# Patient Record
Sex: Male | Born: 1965
Health system: Southern US, Community
[De-identification: ages and names within clinical notes are randomized; demographics above are authoritative.]

## PROBLEM LIST (undated history)

## (undated) DIAGNOSIS — I4729 Other ventricular tachycardia: Secondary | ICD-10-CM

## (undated) DIAGNOSIS — M109 Gout, unspecified: Secondary | ICD-10-CM

## (undated) DIAGNOSIS — M1990 Unspecified osteoarthritis, unspecified site: Secondary | ICD-10-CM

## (undated) DIAGNOSIS — N289 Disorder of kidney and ureter, unspecified: Secondary | ICD-10-CM

## (undated) DIAGNOSIS — I472 Ventricular tachycardia, unspecified: Secondary | ICD-10-CM

## (undated) DIAGNOSIS — I10 Essential (primary) hypertension: Secondary | ICD-10-CM

## (undated) DIAGNOSIS — R609 Edema, unspecified: Secondary | ICD-10-CM

## (undated) DIAGNOSIS — Z5189 Encounter for other specified aftercare: Secondary | ICD-10-CM

## (undated) DIAGNOSIS — I509 Heart failure, unspecified: Secondary | ICD-10-CM

## (undated) DIAGNOSIS — IMO0001 Reserved for inherently not codable concepts without codable children: Secondary | ICD-10-CM

## (undated) DIAGNOSIS — I5023 Acute on chronic systolic (congestive) heart failure: Secondary | ICD-10-CM

## (undated) DIAGNOSIS — N529 Male erectile dysfunction, unspecified: Secondary | ICD-10-CM

## (undated) DIAGNOSIS — I5022 Chronic systolic (congestive) heart failure: Secondary | ICD-10-CM

## (undated) DIAGNOSIS — G5600 Carpal tunnel syndrome, unspecified upper limb: Secondary | ICD-10-CM

## (undated) DIAGNOSIS — E039 Hypothyroidism, unspecified: Secondary | ICD-10-CM

## (undated) DIAGNOSIS — R197 Diarrhea, unspecified: Secondary | ICD-10-CM

## (undated) DIAGNOSIS — R079 Chest pain, unspecified: Secondary | ICD-10-CM

## (undated) HISTORY — DX: Diarrhea, unspecified: R19.7

## (undated) HISTORY — DX: Edema, unspecified: R60.9

## (undated) HISTORY — DX: Gout, unspecified: M10.9

## (undated) HISTORY — DX: Acute on chronic systolic (congestive) heart failure: I50.23

## (undated) HISTORY — DX: Carpal tunnel syndrome, unspecified upper limb: G56.00

## (undated) HISTORY — DX: Male erectile dysfunction, unspecified: N52.9

## (undated) HISTORY — DX: Chronic systolic (congestive) heart failure: I50.22

## (undated) HISTORY — DX: Other ventricular tachycardia: I47.29

## (undated) HISTORY — DX: Ventricular tachycardia: I47.2

## (undated) HISTORY — DX: Heart failure, unspecified: I50.9

## (undated) HISTORY — PX: EYE SURGERY: SHX253

## (undated) HISTORY — DX: Ventricular tachycardia, unspecified: I47.20

## (undated) HISTORY — PX: FRACTURE SURGERY: SHX138

## (undated) HISTORY — DX: Chest pain, unspecified: R07.9

---

## 1999-05-07 ENCOUNTER — Inpatient Hospital Stay (HOSPITAL_COMMUNITY): Admission: EM | Admit: 1999-05-07 | Discharge: 1999-05-16 | Payer: Self-pay | Admitting: Emergency Medicine

## 1999-05-08 ENCOUNTER — Encounter: Payer: Self-pay | Admitting: Pulmonary Disease

## 1999-05-09 ENCOUNTER — Encounter: Payer: Self-pay | Admitting: Pulmonary Disease

## 1999-05-10 ENCOUNTER — Encounter: Payer: Self-pay | Admitting: Pulmonary Disease

## 1999-05-12 ENCOUNTER — Encounter: Payer: Self-pay | Admitting: Pulmonary Disease

## 1999-05-13 ENCOUNTER — Encounter: Payer: Self-pay | Admitting: Pulmonary Disease

## 1999-05-15 ENCOUNTER — Encounter: Payer: Self-pay | Admitting: Pulmonary Disease

## 1999-05-23 ENCOUNTER — Ambulatory Visit (HOSPITAL_COMMUNITY): Admission: RE | Admit: 1999-05-23 | Discharge: 1999-05-23 | Payer: Self-pay | Admitting: Pulmonary Disease

## 1999-05-23 ENCOUNTER — Encounter: Payer: Self-pay | Admitting: Pulmonary Disease

## 2004-06-18 ENCOUNTER — Ambulatory Visit (HOSPITAL_COMMUNITY): Admission: RE | Admit: 2004-06-18 | Discharge: 2004-06-18 | Payer: Self-pay | Admitting: Internal Medicine

## 2004-07-26 ENCOUNTER — Ambulatory Visit: Payer: Self-pay | Admitting: Internal Medicine

## 2004-07-27 ENCOUNTER — Ambulatory Visit: Payer: Self-pay | Admitting: Sports Medicine

## 2004-07-27 ENCOUNTER — Inpatient Hospital Stay (HOSPITAL_COMMUNITY): Admission: EM | Admit: 2004-07-27 | Discharge: 2004-07-30 | Payer: Self-pay | Admitting: Emergency Medicine

## 2004-07-30 ENCOUNTER — Encounter (INDEPENDENT_AMBULATORY_CARE_PROVIDER_SITE_OTHER): Payer: Self-pay | Admitting: Cardiology

## 2004-07-30 ENCOUNTER — Ambulatory Visit: Payer: Self-pay | Admitting: *Deleted

## 2004-08-15 ENCOUNTER — Ambulatory Visit: Payer: Self-pay | Admitting: Internal Medicine

## 2004-08-16 ENCOUNTER — Ambulatory Visit: Payer: Self-pay | Admitting: Internal Medicine

## 2004-08-23 ENCOUNTER — Ambulatory Visit: Payer: Self-pay | Admitting: Internal Medicine

## 2004-09-13 ENCOUNTER — Ambulatory Visit: Payer: Self-pay | Admitting: Internal Medicine

## 2004-12-06 ENCOUNTER — Ambulatory Visit: Payer: Self-pay | Admitting: Internal Medicine

## 2004-12-09 ENCOUNTER — Ambulatory Visit: Payer: Self-pay | Admitting: *Deleted

## 2004-12-09 ENCOUNTER — Inpatient Hospital Stay (HOSPITAL_COMMUNITY): Admission: EM | Admit: 2004-12-09 | Discharge: 2004-12-16 | Payer: Self-pay | Admitting: Family Medicine

## 2004-12-09 ENCOUNTER — Ambulatory Visit: Payer: Self-pay | Admitting: Internal Medicine

## 2004-12-09 ENCOUNTER — Ambulatory Visit: Payer: Self-pay | Admitting: Pulmonary Disease

## 2004-12-09 ENCOUNTER — Ambulatory Visit: Payer: Self-pay | Admitting: Infectious Diseases

## 2004-12-10 ENCOUNTER — Encounter (INDEPENDENT_AMBULATORY_CARE_PROVIDER_SITE_OTHER): Payer: Self-pay | Admitting: Cardiology

## 2004-12-18 ENCOUNTER — Ambulatory Visit: Payer: Self-pay | Admitting: Internal Medicine

## 2004-12-20 ENCOUNTER — Ambulatory Visit: Payer: Self-pay | Admitting: Internal Medicine

## 2004-12-23 ENCOUNTER — Emergency Department (HOSPITAL_COMMUNITY): Admission: EM | Admit: 2004-12-23 | Discharge: 2004-12-23 | Payer: Self-pay | Admitting: Emergency Medicine

## 2004-12-26 ENCOUNTER — Ambulatory Visit: Payer: Self-pay | Admitting: Internal Medicine

## 2004-12-27 ENCOUNTER — Ambulatory Visit: Payer: Self-pay | Admitting: Internal Medicine

## 2005-01-01 ENCOUNTER — Ambulatory Visit: Payer: Self-pay | Admitting: Internal Medicine

## 2005-01-06 ENCOUNTER — Ambulatory Visit: Payer: Self-pay | Admitting: Internal Medicine

## 2005-01-13 ENCOUNTER — Ambulatory Visit: Payer: Self-pay | Admitting: Internal Medicine

## 2005-01-21 ENCOUNTER — Ambulatory Visit: Payer: Self-pay | Admitting: Internal Medicine

## 2005-02-17 ENCOUNTER — Ambulatory Visit: Payer: Self-pay | Admitting: Internal Medicine

## 2005-03-04 ENCOUNTER — Ambulatory Visit: Payer: Self-pay | Admitting: Internal Medicine

## 2005-05-23 IMAGING — CR DG CHEST 2V
2 series · 2 of 2 positions shown · non-contrast
Comparison: none

CLINICAL DATA: Shortness of breath, weakness.  History of   congestive heart failure.  
 CHEST TWO VIEWS
 Moderate cardiomegaly is seen as well as pulmonary venous hypertension.  There is no evidence of acute infiltrate or edema.  There is no evidence of pleural effusion.  No mass or adenopathy identified.
 IMPRESSION
 Moderate cardiomegaly and pulmonary venous hypertension.  No acute disease.

[view not recorded (1 of 2)]
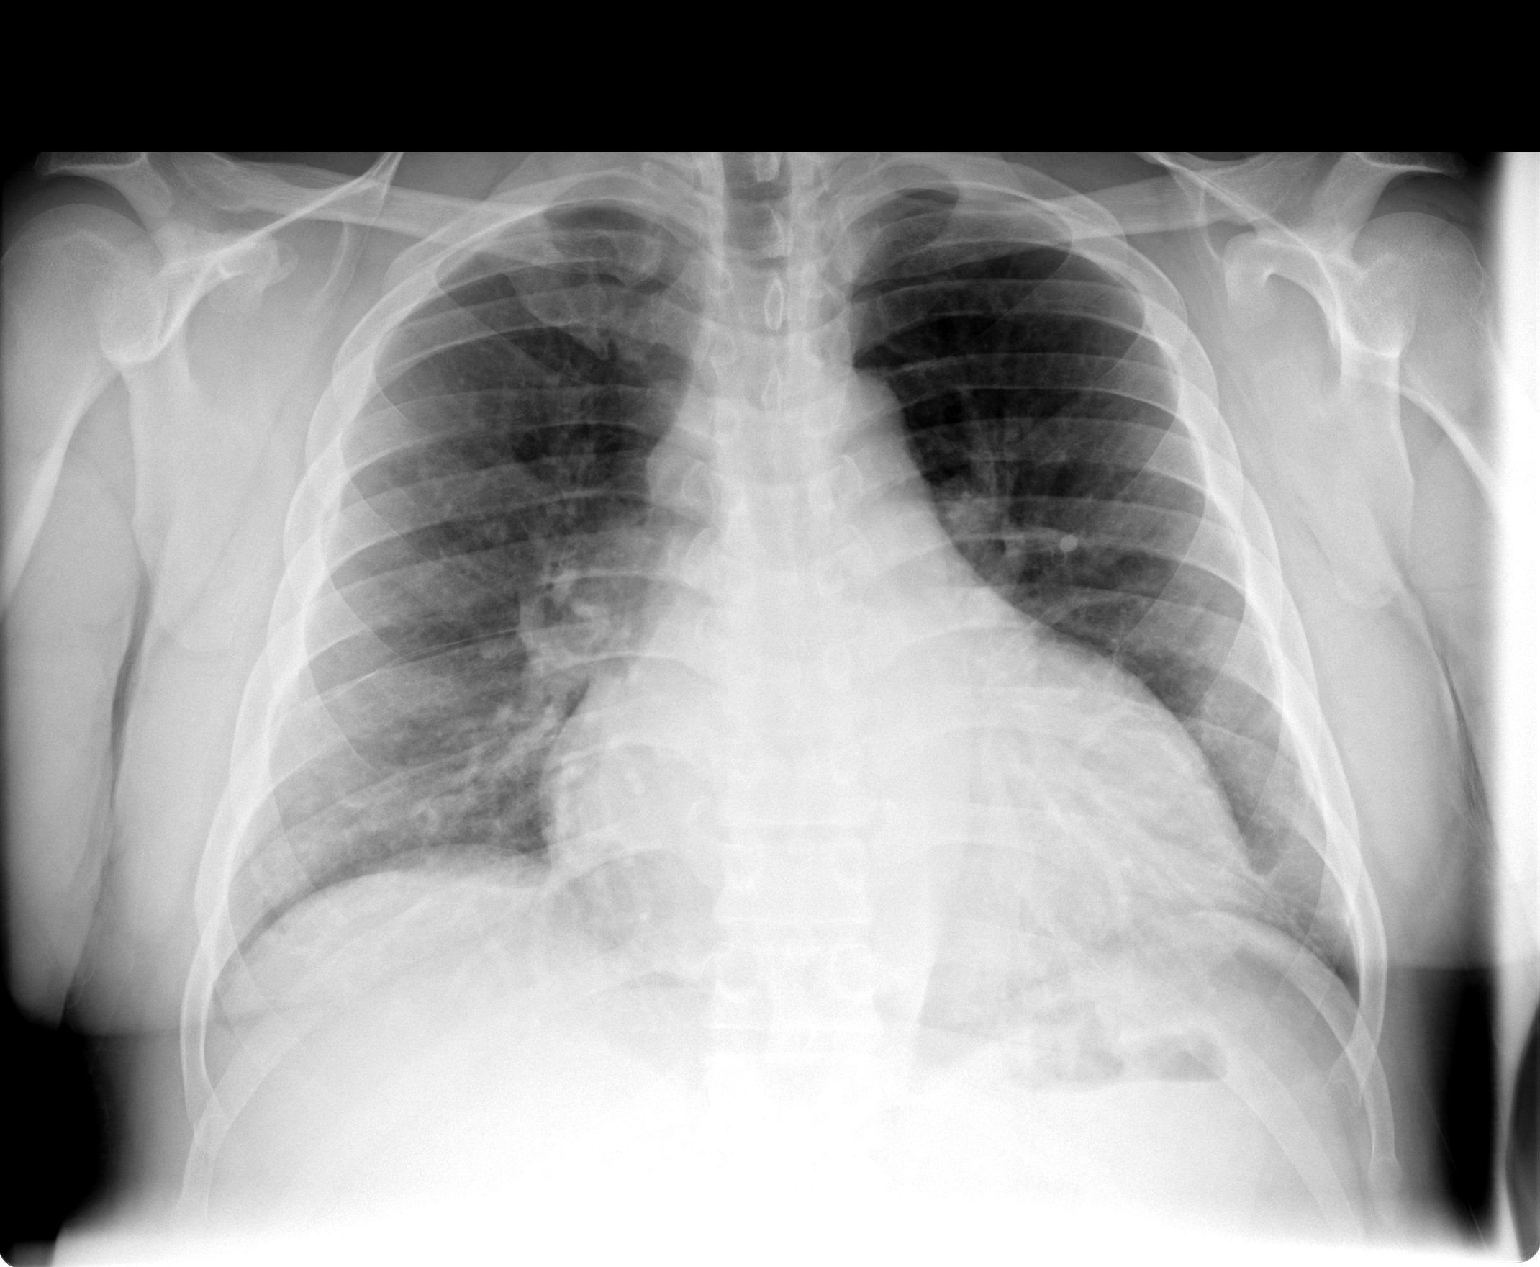

[view not recorded (2 of 2)]
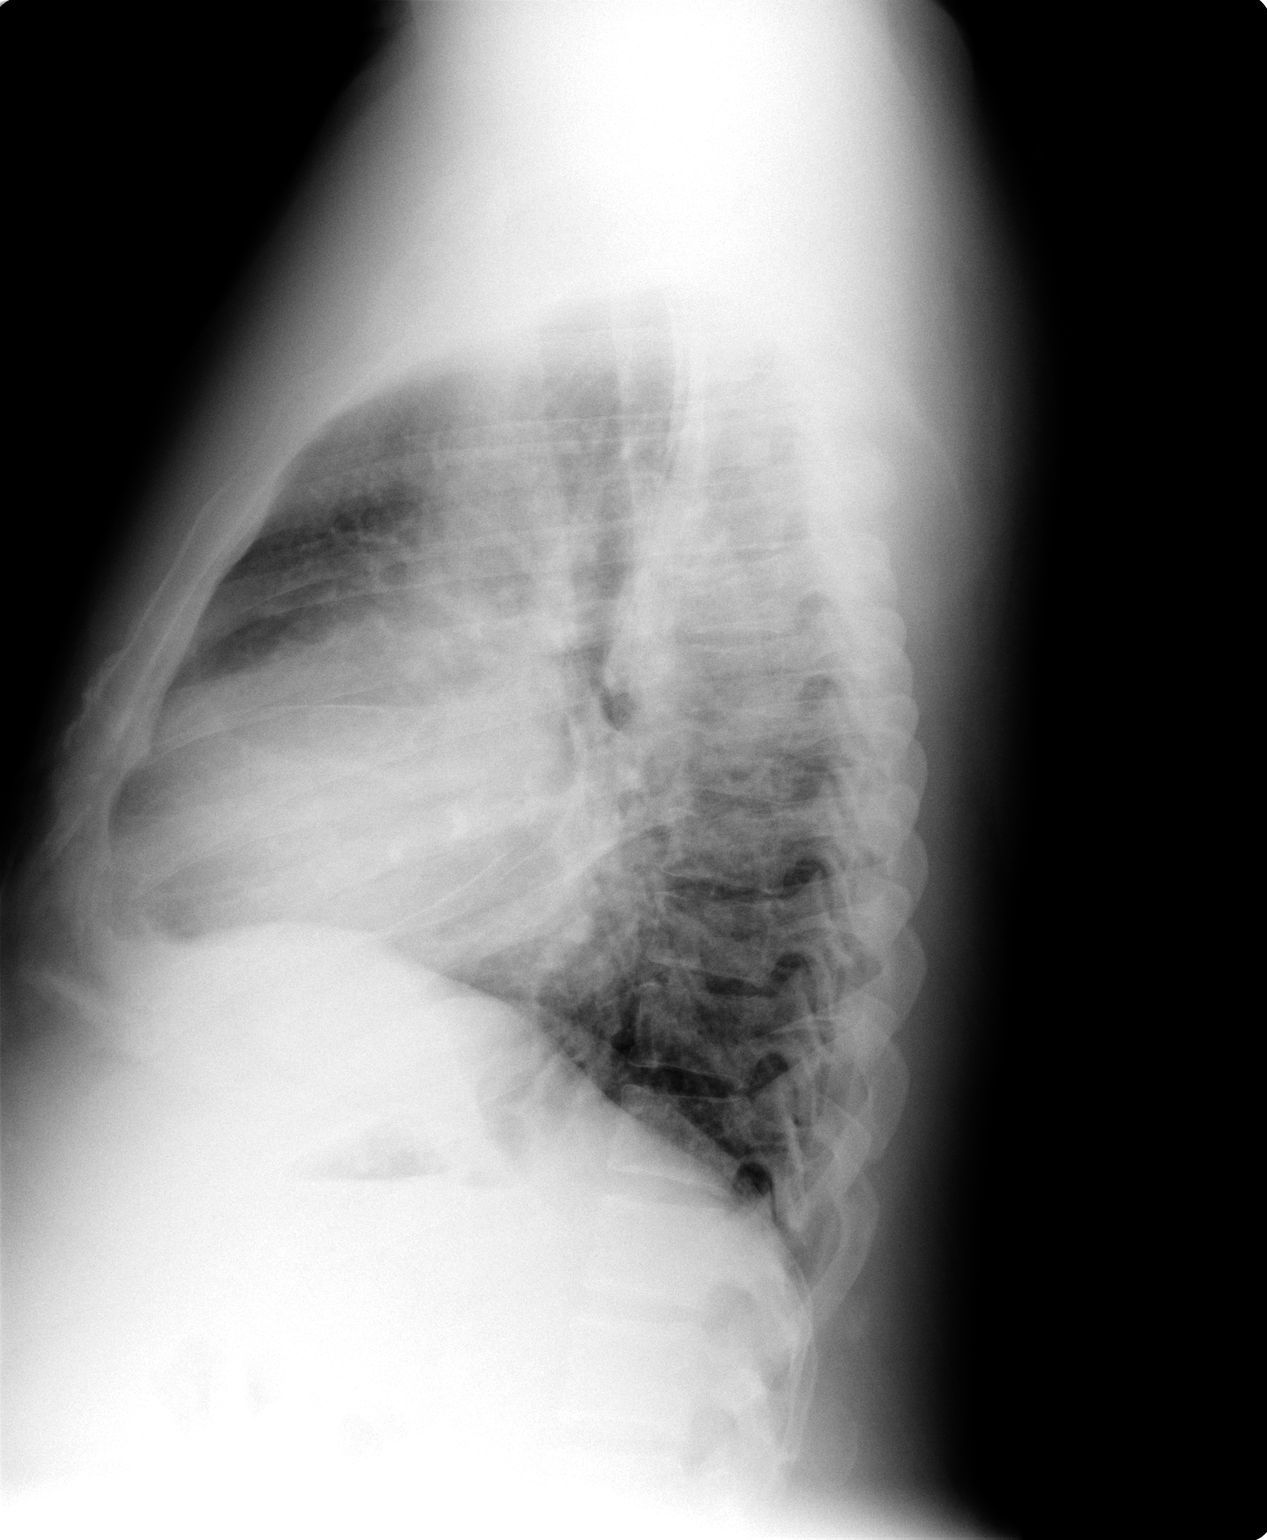

[2 of 2 positions shown; findings below may reference images not displayed]

## 2005-05-30 ENCOUNTER — Ambulatory Visit: Payer: Self-pay | Admitting: Internal Medicine

## 2005-06-05 ENCOUNTER — Ambulatory Visit (HOSPITAL_COMMUNITY): Admission: RE | Admit: 2005-06-05 | Discharge: 2005-06-05 | Payer: Self-pay | Admitting: Internal Medicine

## 2005-07-01 IMAGING — CR DG CHEST 2V
2 series · 2 of 2 positions shown · non-contrast
Comparison: 18 June, 2004.

CLINICAL DATA: Short of breath.   Right arm pain.
 CHEST 2 VIEW

[view not recorded (1 of 2)]
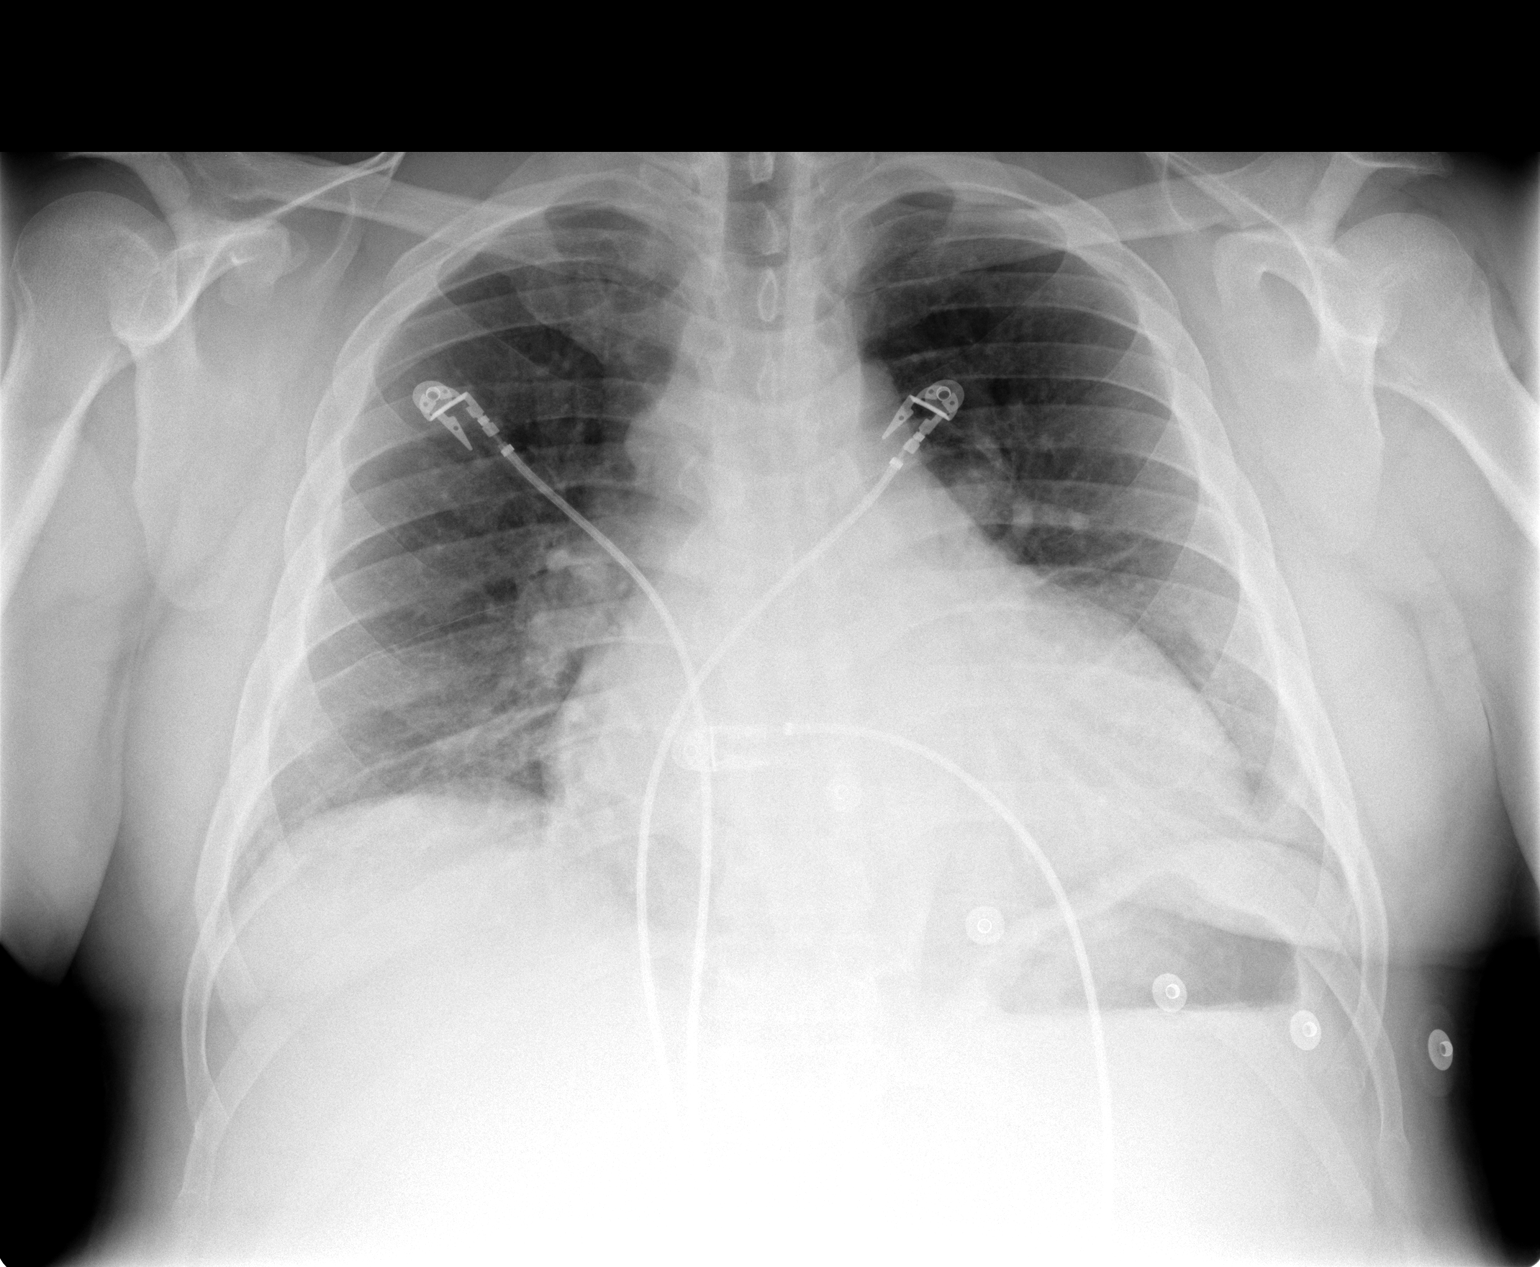

[view not recorded (2 of 2)]
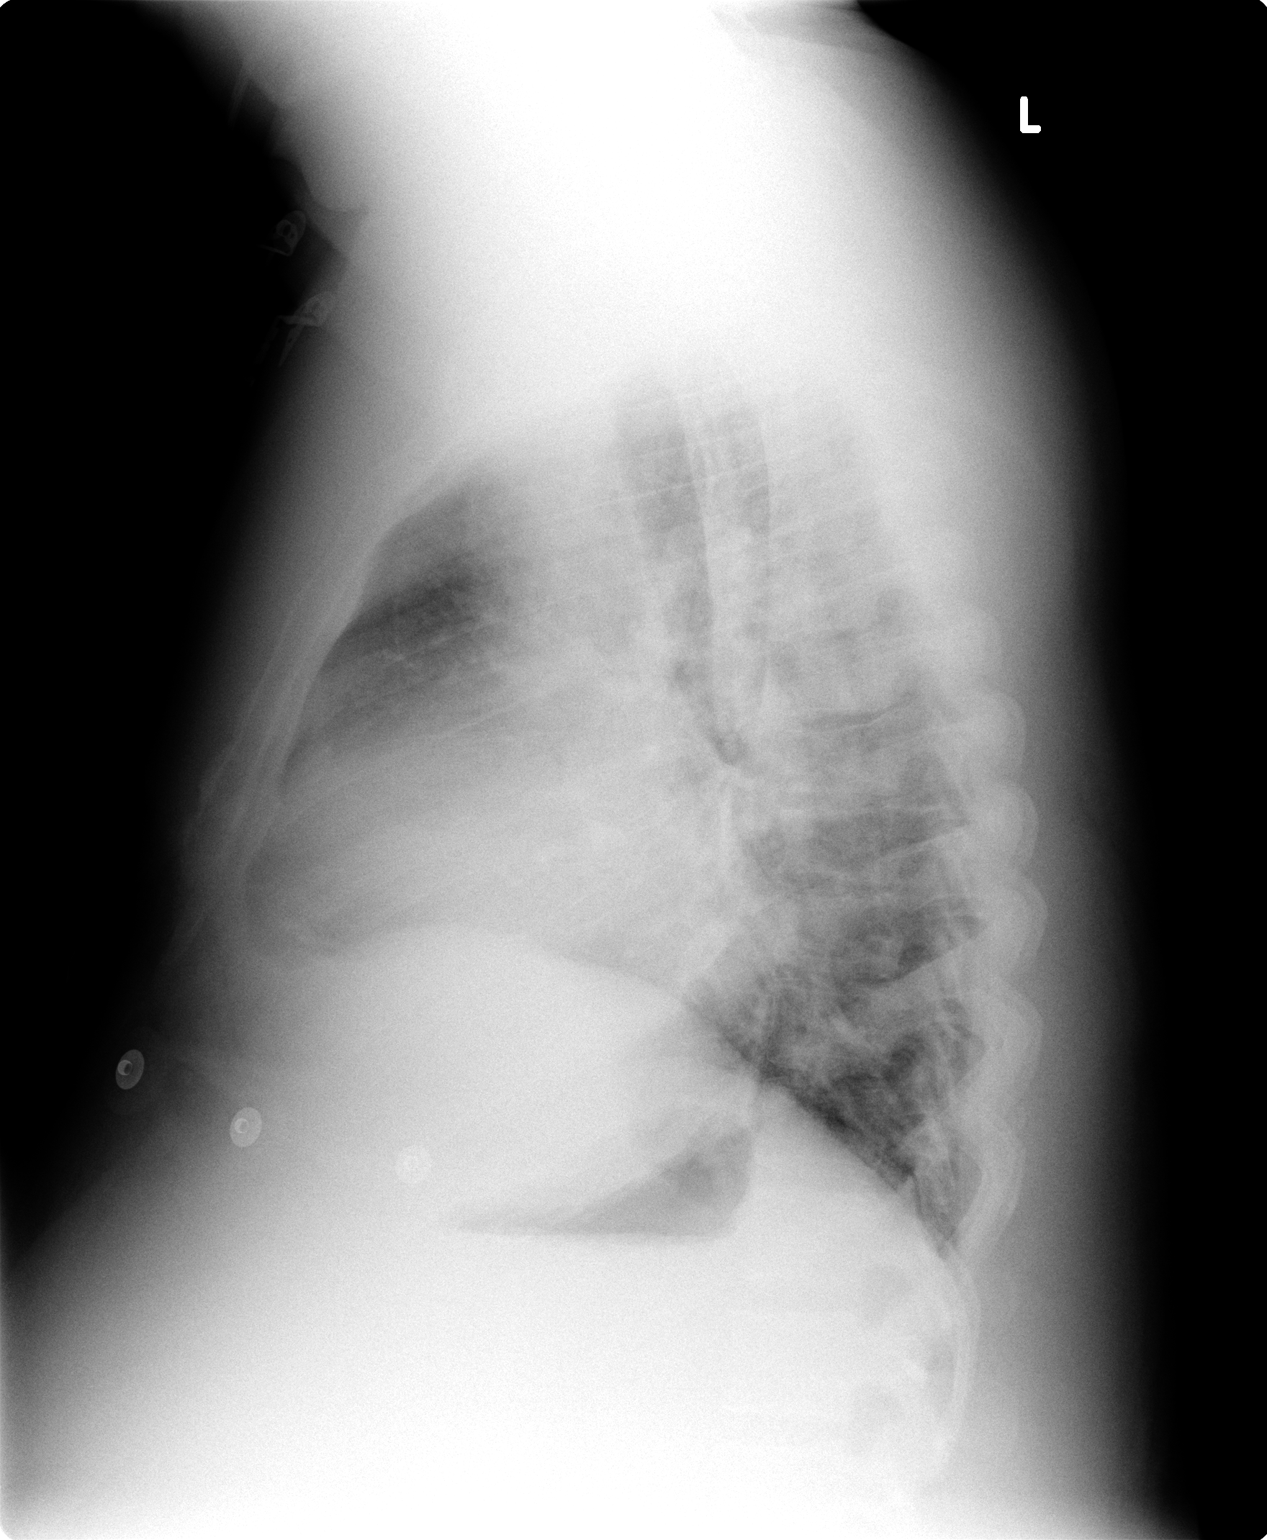

[2 of 2 positions shown; findings below may reference images not displayed]

The heart is enlarged and there is mild vascular congestion.  There is no edema or effusion.
 IMPRESSION
 Cardiac enlargement and vascular congestion unchanged from the prior study.

## 2005-11-13 IMAGING — CR DG CHEST 1V PORT
1 series · 1 of 1 positions shown · non-contrast
Comparison: 12/09/2004 and 07/27/2004.

CLINICAL DATA: Chest pain.  Shortness of breath.  Hypertension. 
 PORTABLE CHEST:

[view not recorded]
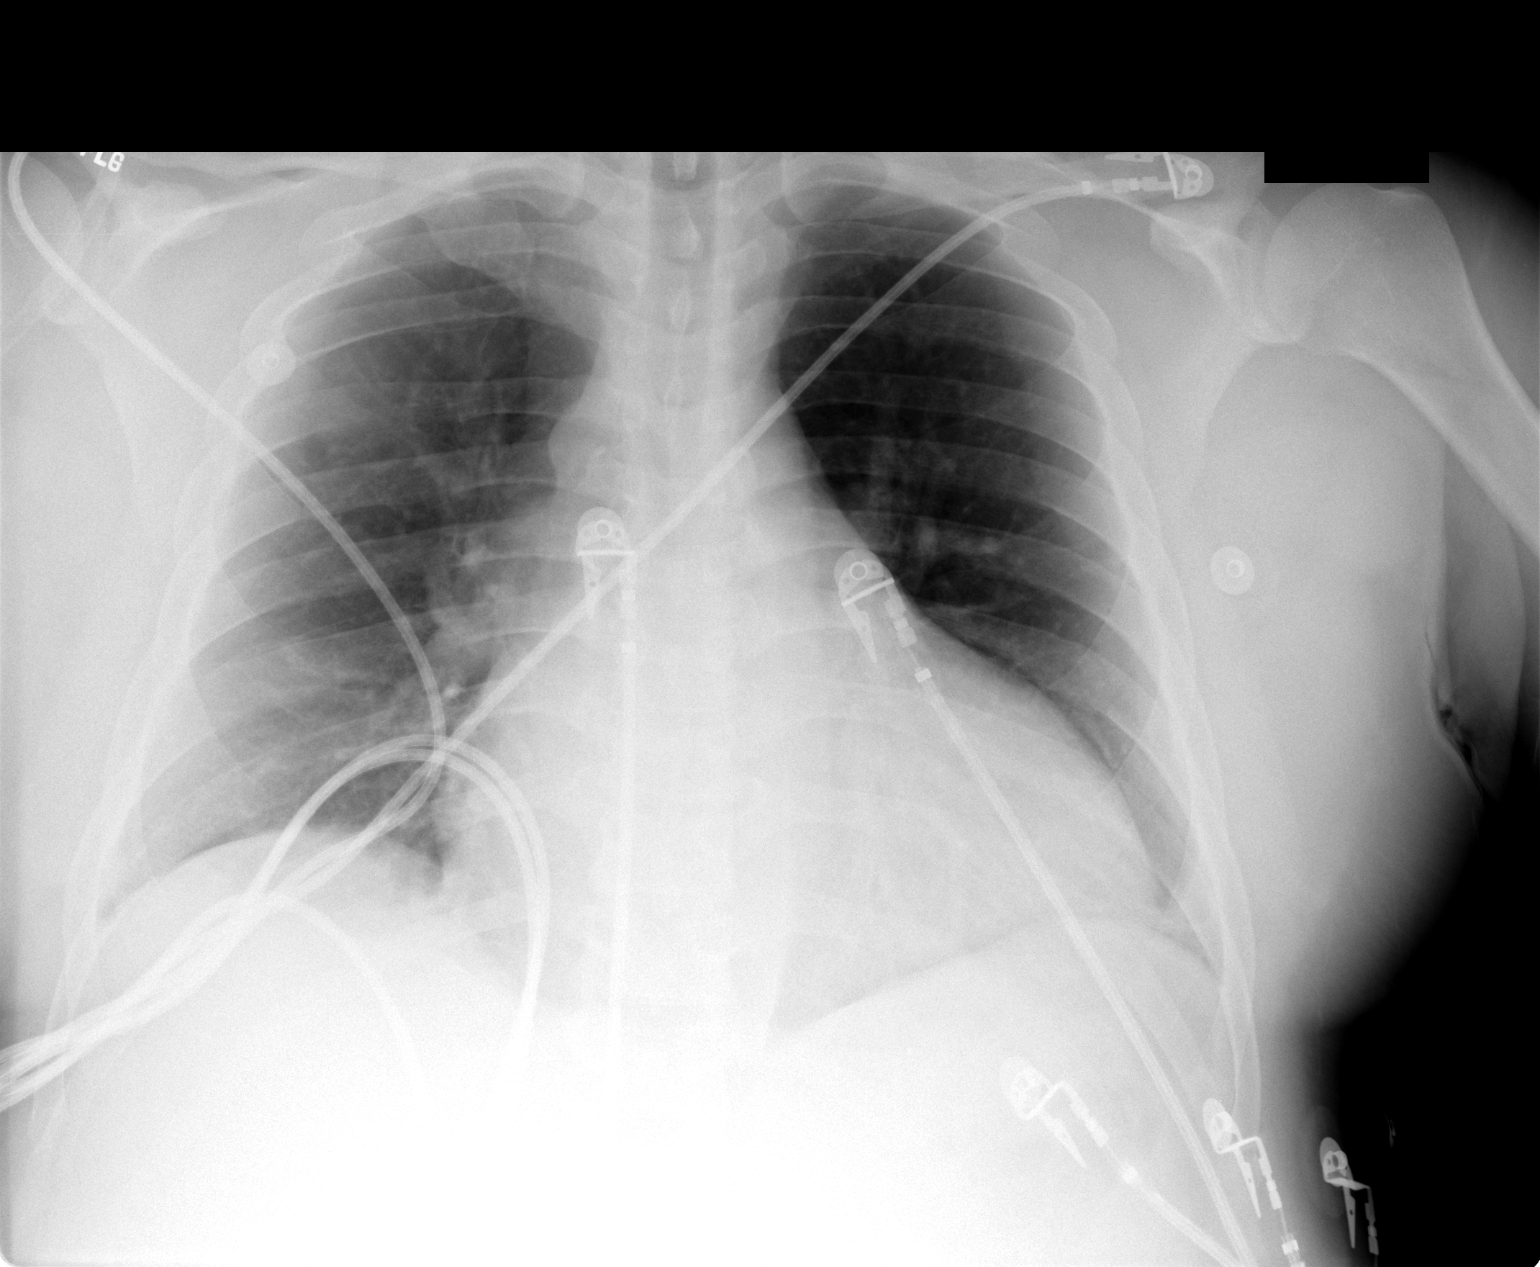

[1 of 1 positions shown; findings below may reference images not displayed]

Moderate cardiomegaly remains stable.  Mild scarring is noted at the left lung base which is unchanged since earlier chest radiographs.  There is no evidence of acute infiltrate or congestive heart failure.   There is no evidence of pleural effusion.
IMPRESSION: Stable moderate cardiomegaly.  No acute findings.

## 2005-11-13 IMAGING — CR DG CHEST 2V
2 series · 2 of 2 positions shown · non-contrast
Comparison: 07/27/2004.

CLINICAL DATA: 38 year-old with chest pain and shortness of breath with exertion.  History of smoking.
 TWO VIEWS OF THE CHEST:

[view not recorded (1 of 2)]
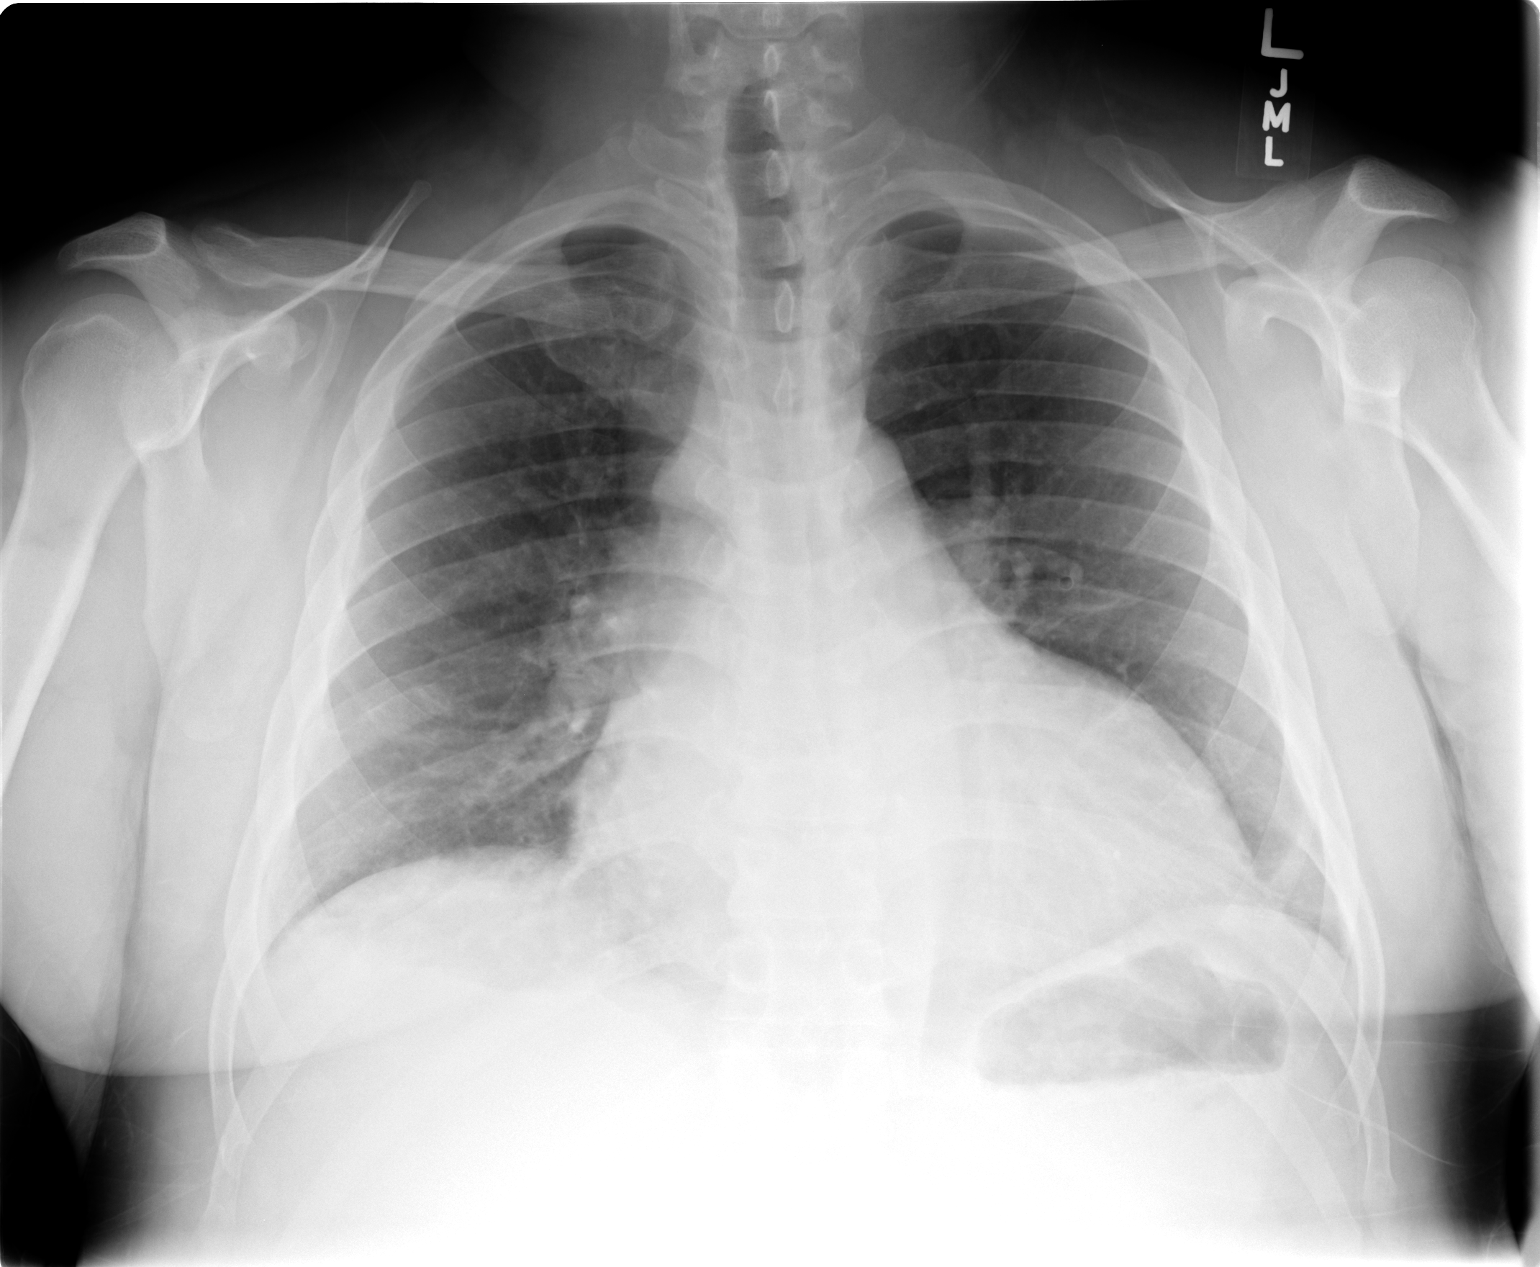

[view not recorded (2 of 2)]
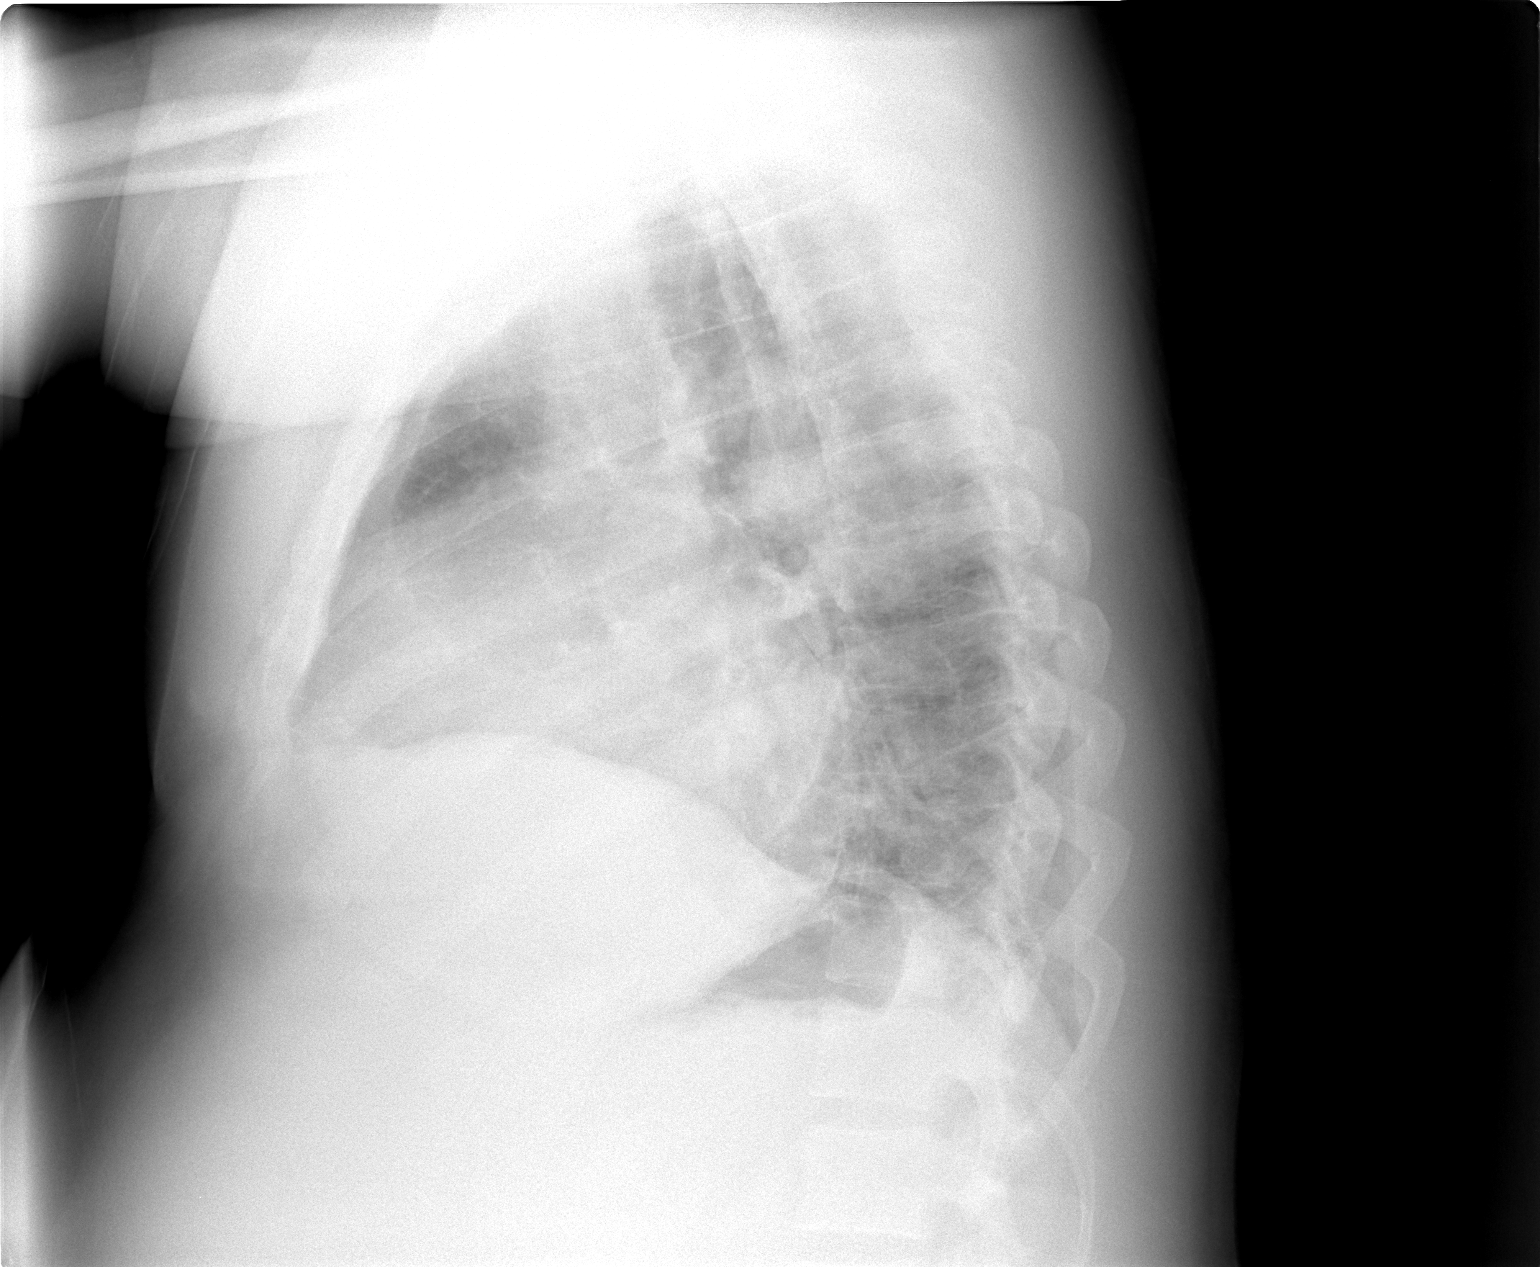

[2 of 2 positions shown; findings below may reference images not displayed]

FINDINGS: Heart size is enlarged.  There is minimal left base atelectasis.  No focal consolidation or pleural effusion.  No evidence for pulmonary edema.
IMPRESSION: Cardiomegaly without evidence for acute pulmonary abnormality.

## 2005-11-14 IMAGING — CR DG CHEST 1V PORT
1 series · 1 of 1 positions shown · non-contrast
Comparison: none

CLINICAL DATA: Chest pain. PICC line placement.
 PORTABLE CHEST SINGLE VIEW ? 12/10/04:
 An AP sitting portable film of the chest made 12/10/04 at 6669 hours is compared to the previous study of 2092 hours on this day and now shows a left PICC line to have been inserted.  The tip of that catheter lies near the junction of the right atrium and superior vena cava.  There is no pneumothorax. 
 There is again noted cardiomegaly and bilateral basilar atelectasis.

[view not recorded]
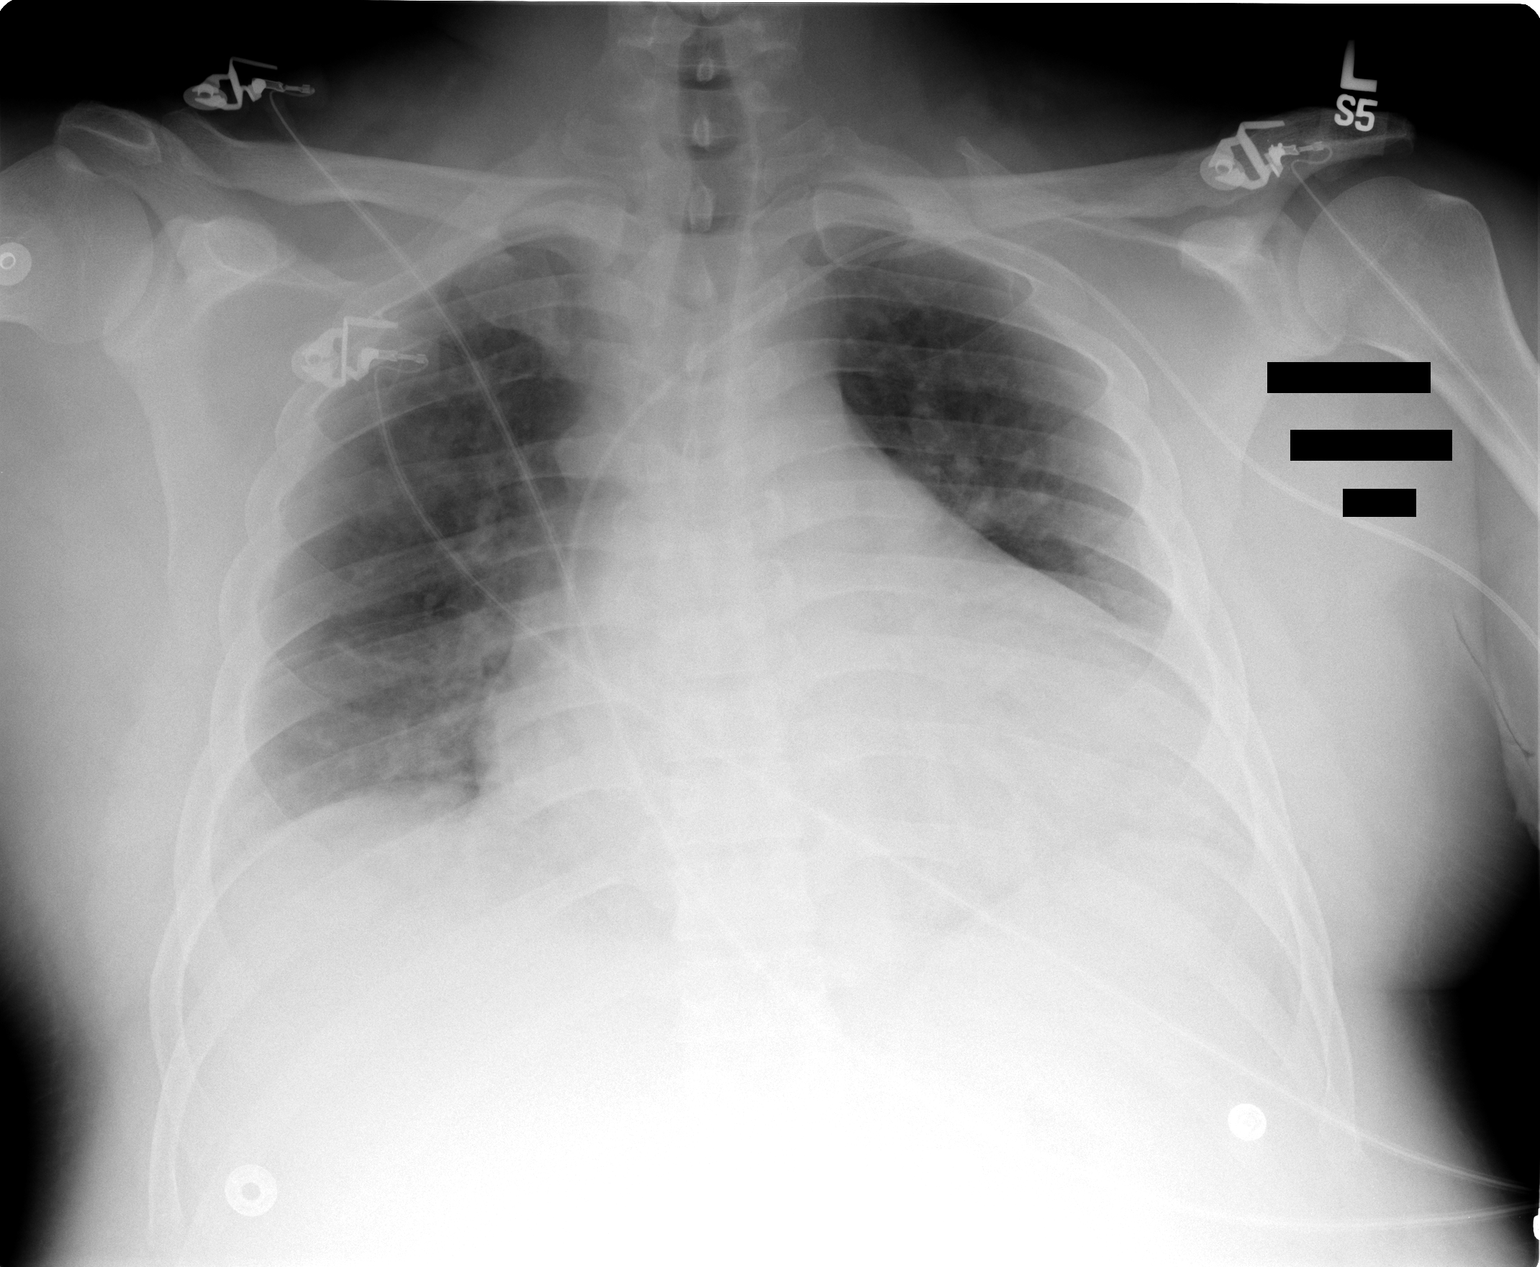

[1 of 1 positions shown; findings below may reference images not displayed]

IMPRESSION: PICC line tip near the junction of the right atrium and superior vena cava.  No pneumothorax.
 Bilateral basilar atelectasis and cardiomegaly are again noted.

## 2005-11-14 IMAGING — CR DG CHEST 2V
2 series · 2 of 2 positions shown · non-contrast
Comparison: 12/09/04.

CLINICAL DATA: Chest pain, shortness of breath.  Cough.
 CHEST - 2 VIEW:

[view not recorded (1 of 2)]
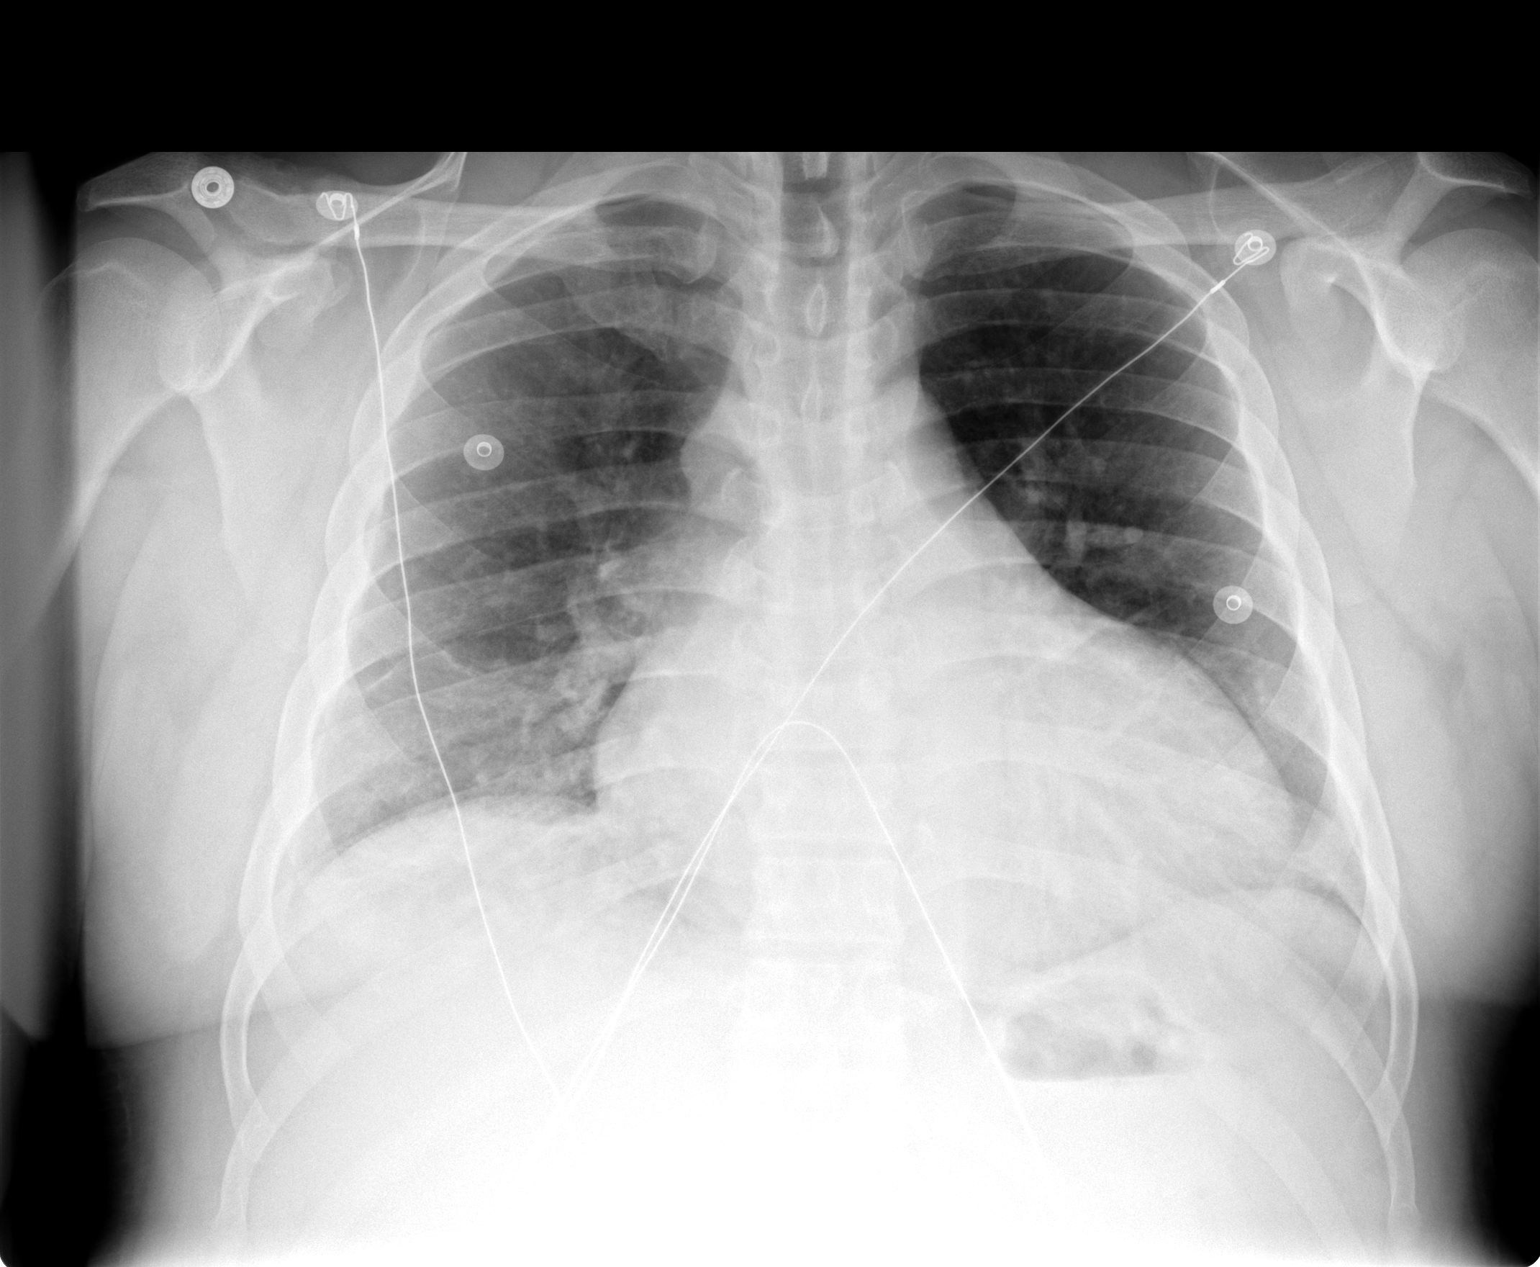

[view not recorded (2 of 2)]
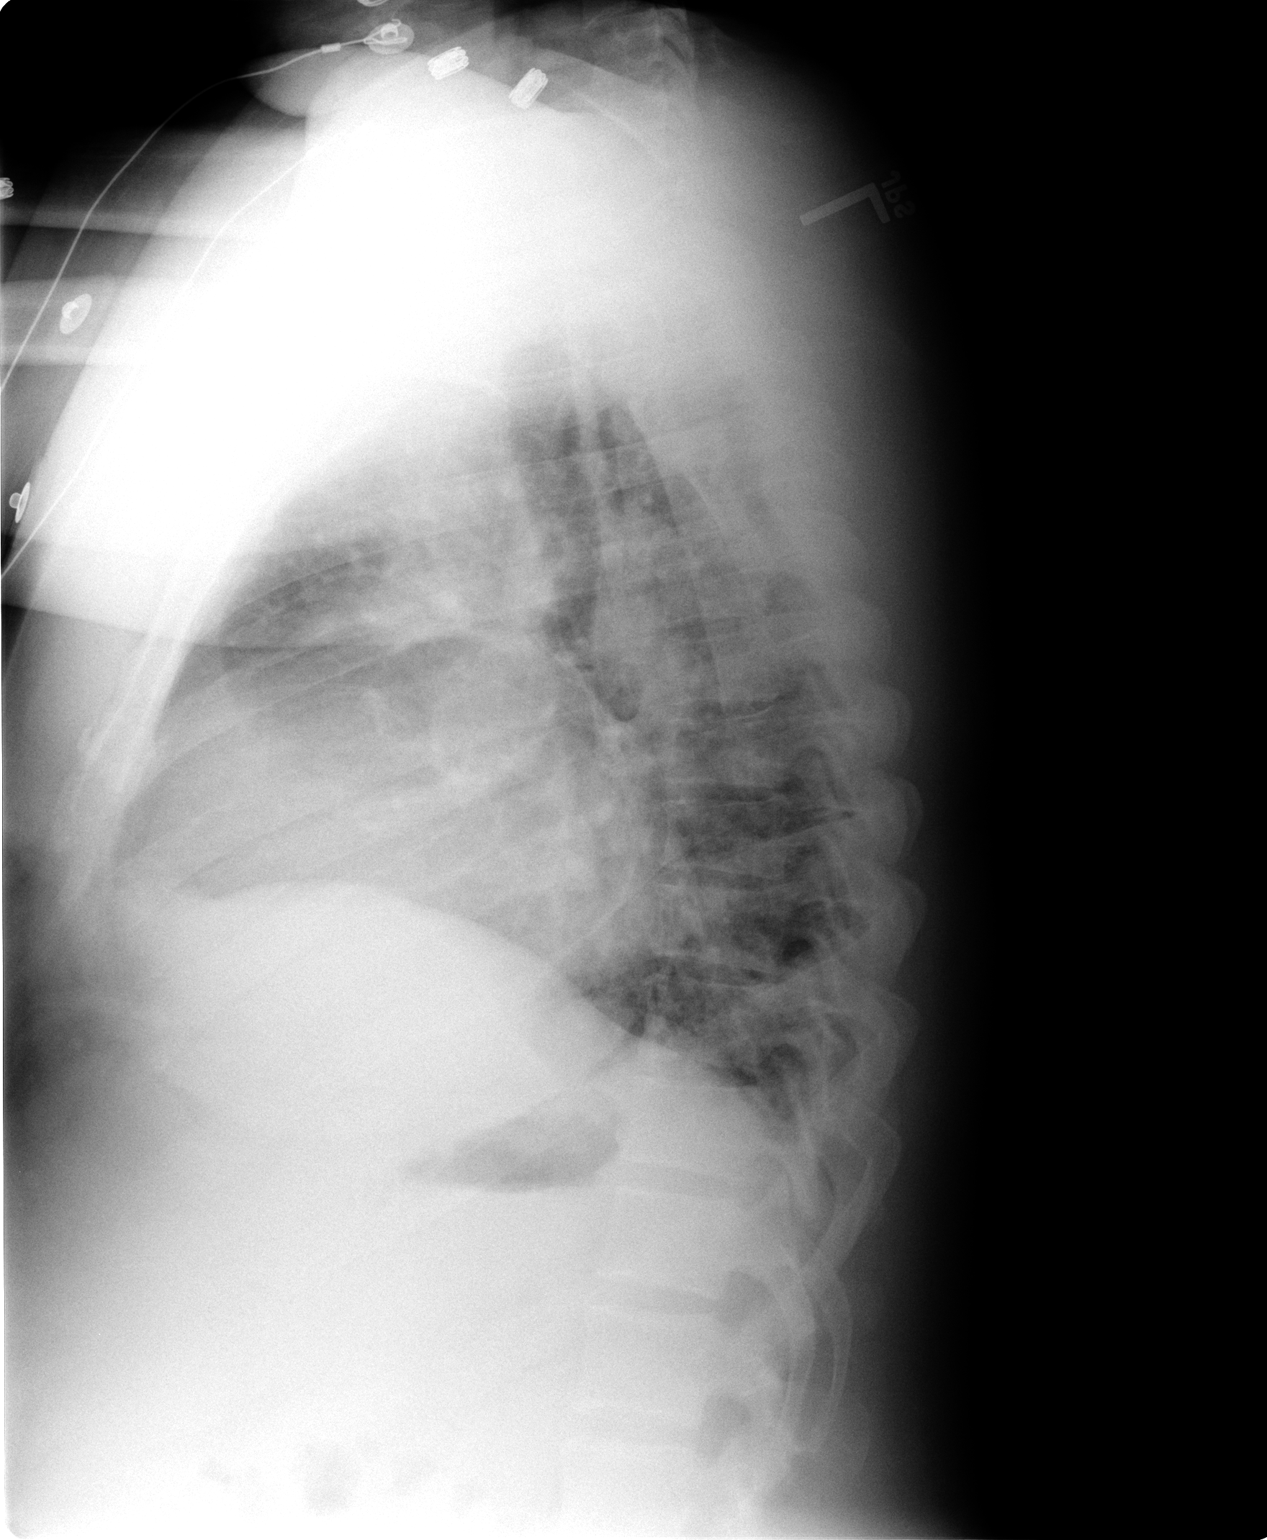

[2 of 2 positions shown; findings below may reference images not displayed]

There is stable enlargement of the cardiopericardial silhouette.  Subsegmental atelectasis is present in the lingula and along the minor fissure.  There is some crowding of the pulmonary vasculature with no overt edema.
IMPRESSION: 1.  Cardiomegaly.
 2.  Subsegmental atelectasis along the lingula and minor fissure.

## 2005-11-14 IMAGING — US US RETROPERITONEAL COMPLETE
1 series · 14 of 25 positions shown · non-contrast
Comparison: None.

CLINICAL DATA: Elevated serum creatinine.
 ULTRASOUND RENAL:

[Series 1: unknown · 0.40mm/px · 14 of 26 slices shown]
[im 1/26]
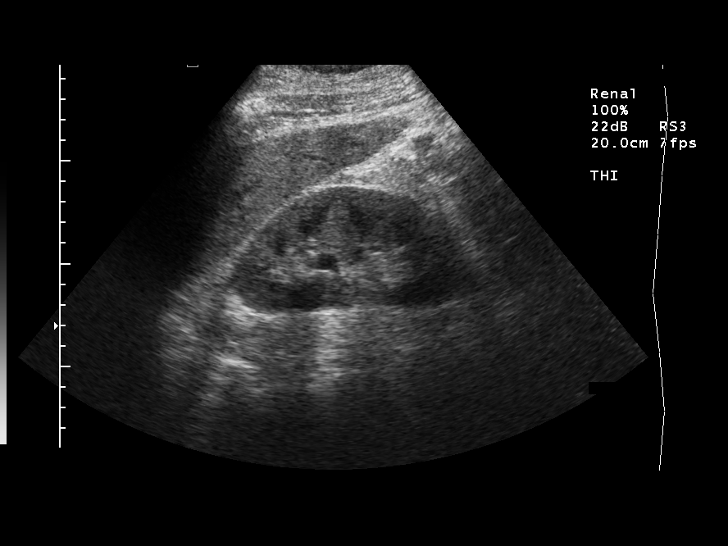
[im 3/26]
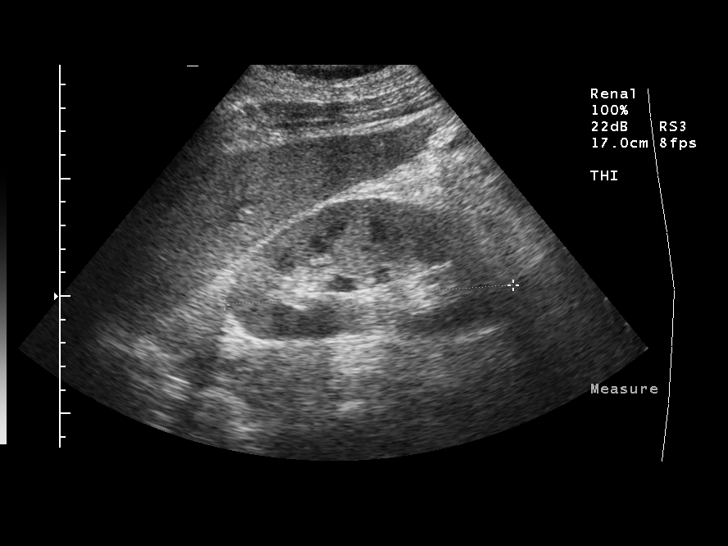
[im 5/26]
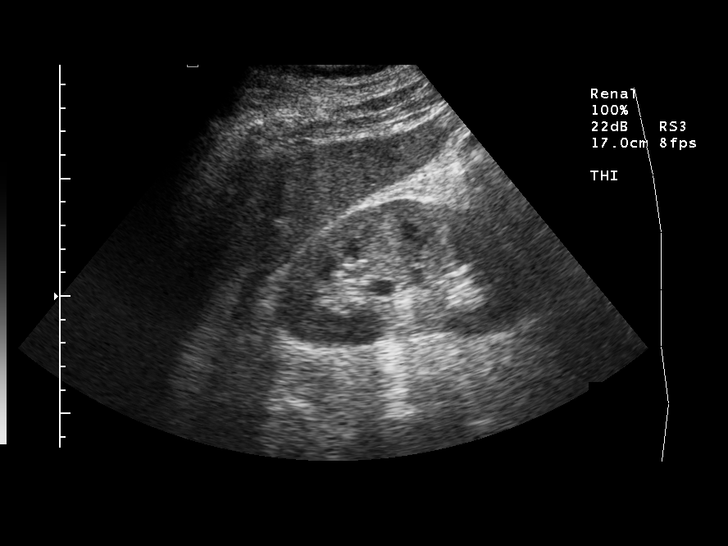
[im 7/26]
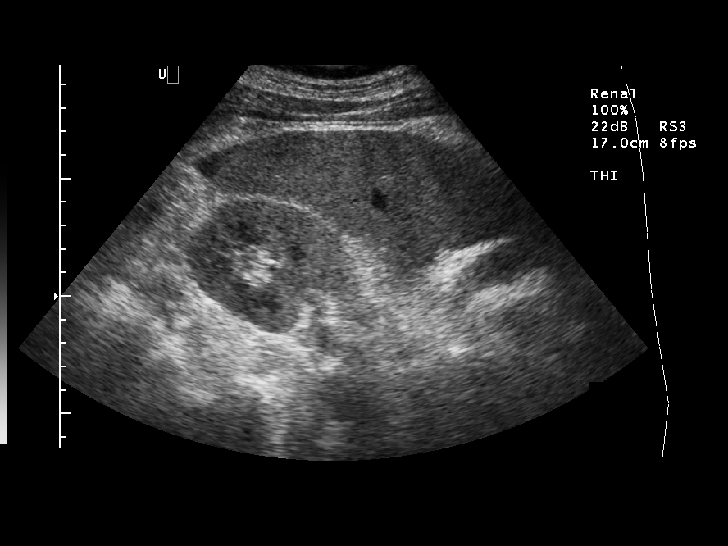
[im 9/26]
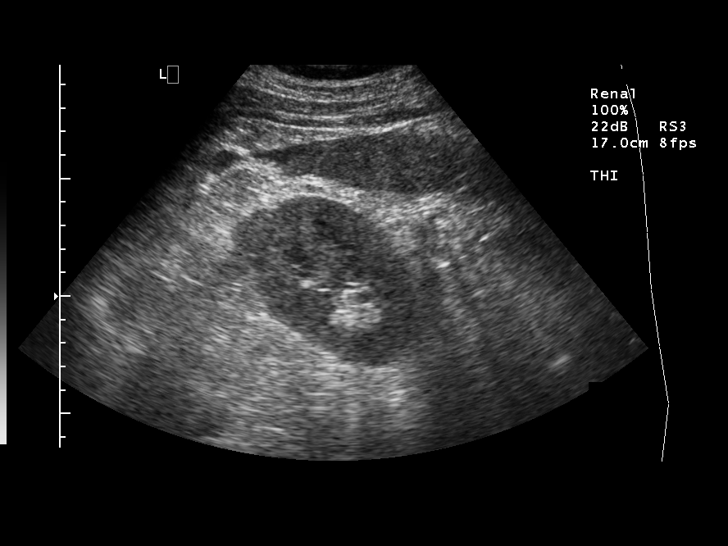
[im 10/26]
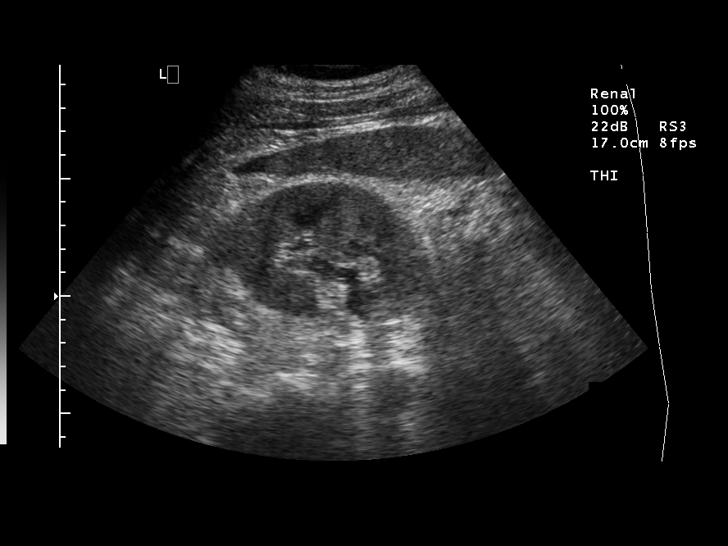
[im 12/26]
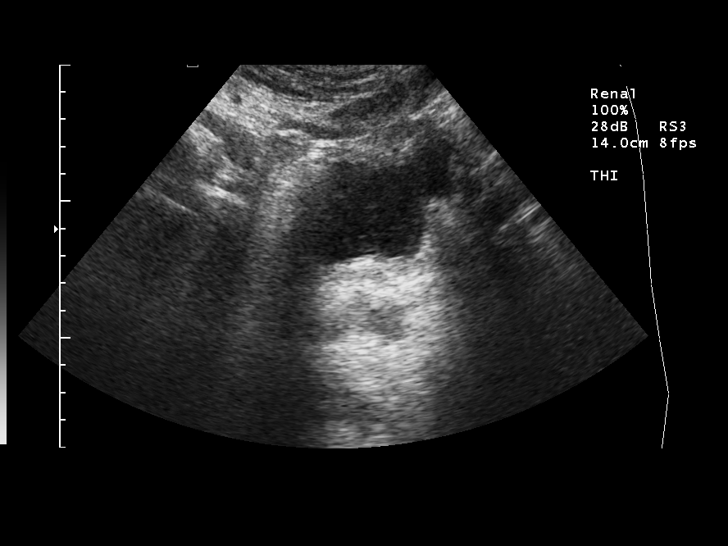
[im 14/26]
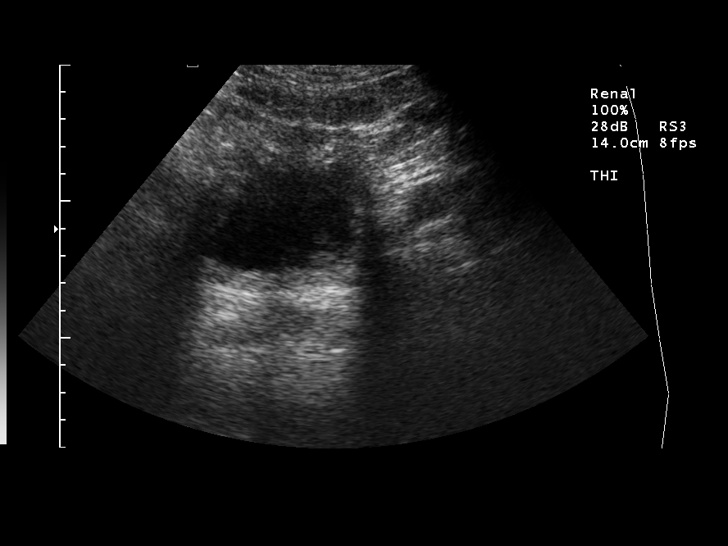
[im 16/26]
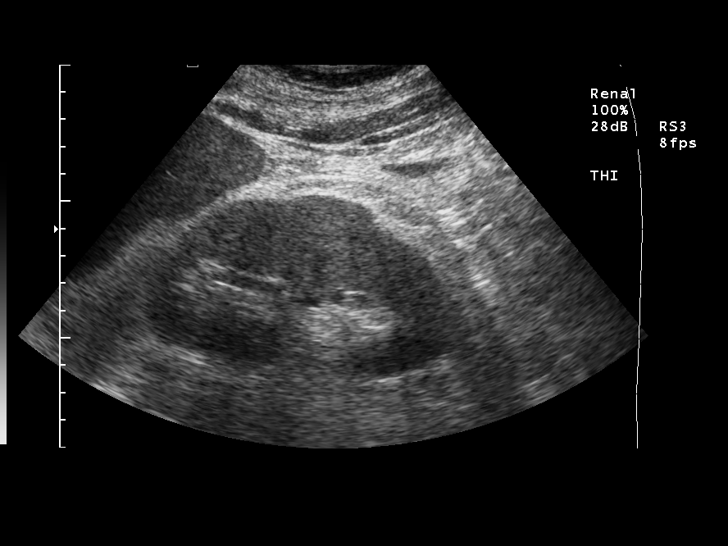
[im 17/26]
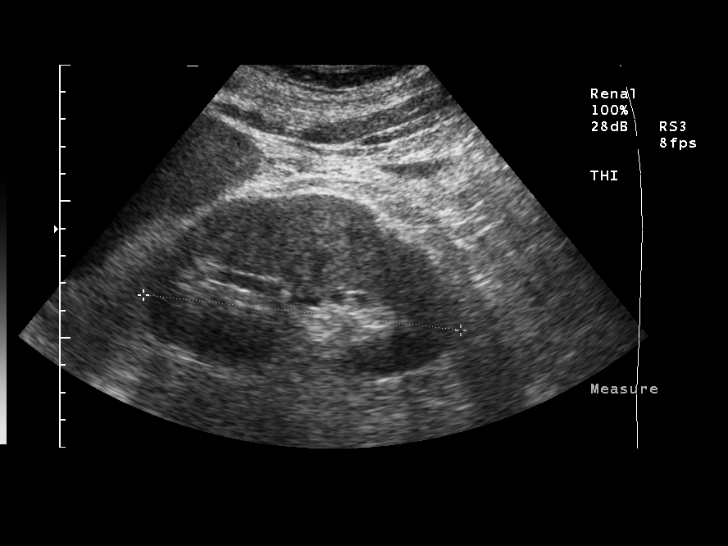
[im 19/26]
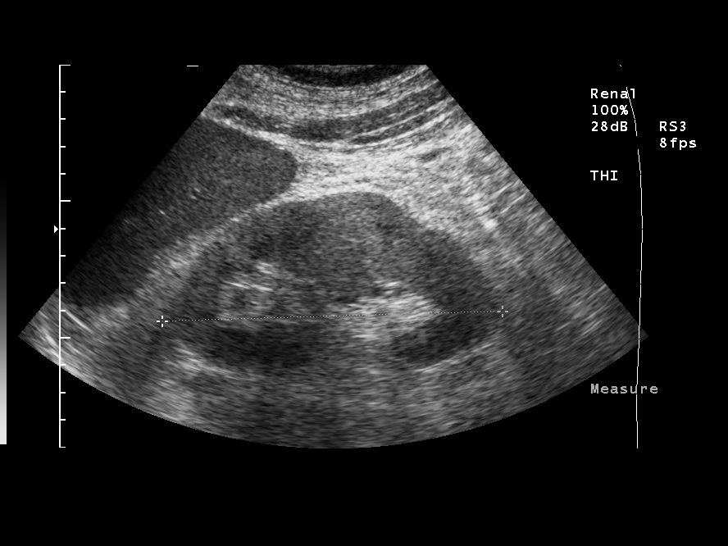
[im 21/26]
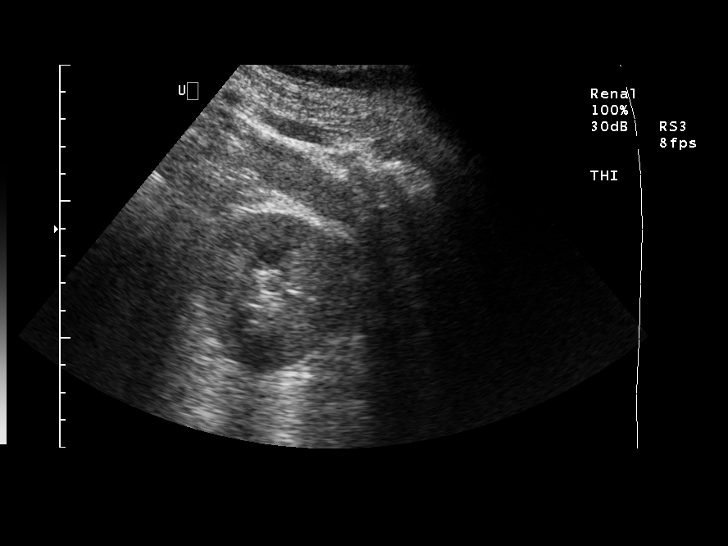
[im 23/26]
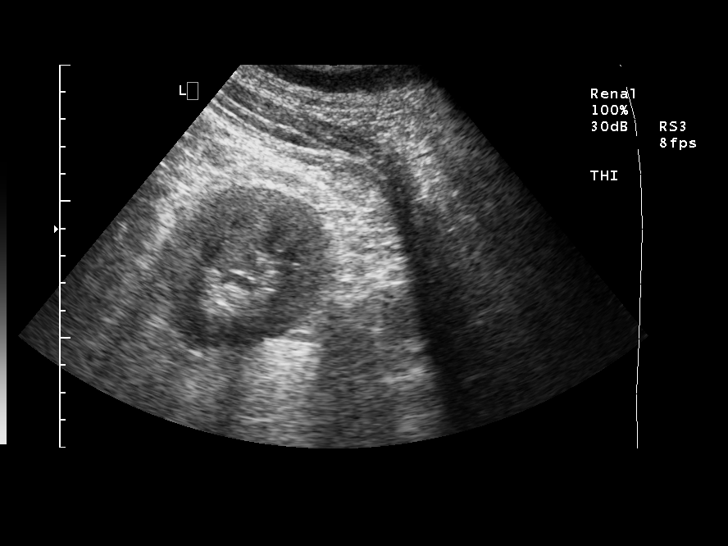
[im 26/26]
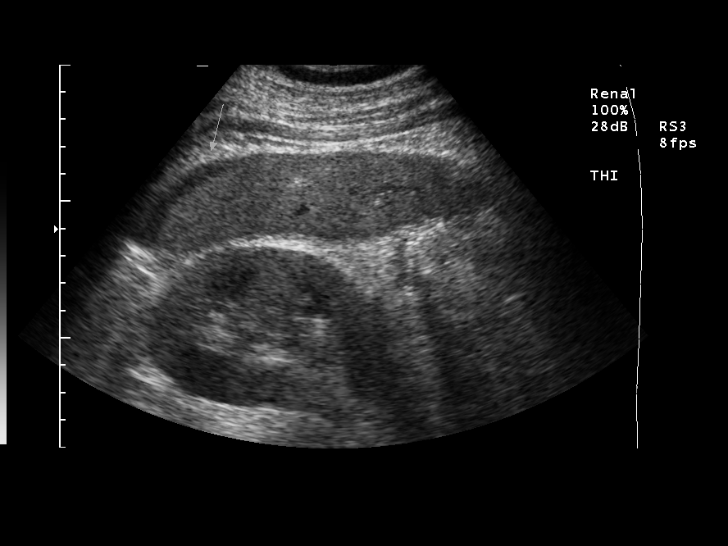

[14 of 25 positions shown; findings below may reference images not displayed]

The right kidney is 12.3 cm in long axis.  The left kidney measures 12.4 cm.  Renal parenchymal echo texture is increased bilaterally in a diffuse fashion.  There is no evidence for hydronephrosis.
 Midline imaging through the anatomic pelvis shows a non distended urinary bladder.
 Trace amount of ascites is identified adjacent to the liver and spleen.
IMPRESSION: 1.  Mildly increased renal parenchymal echo texture suggests medical renal disease.
 2.  Trace ascites.

## 2005-11-14 IMAGING — CR DG ABDOMEN 2V
2 series · 2 of 2 positions shown · non-contrast
Comparison: None.

CLINICAL DATA: Chest pain.  
 ABDOMEN ? TWO VIEW:

[view not recorded (1 of 2)]
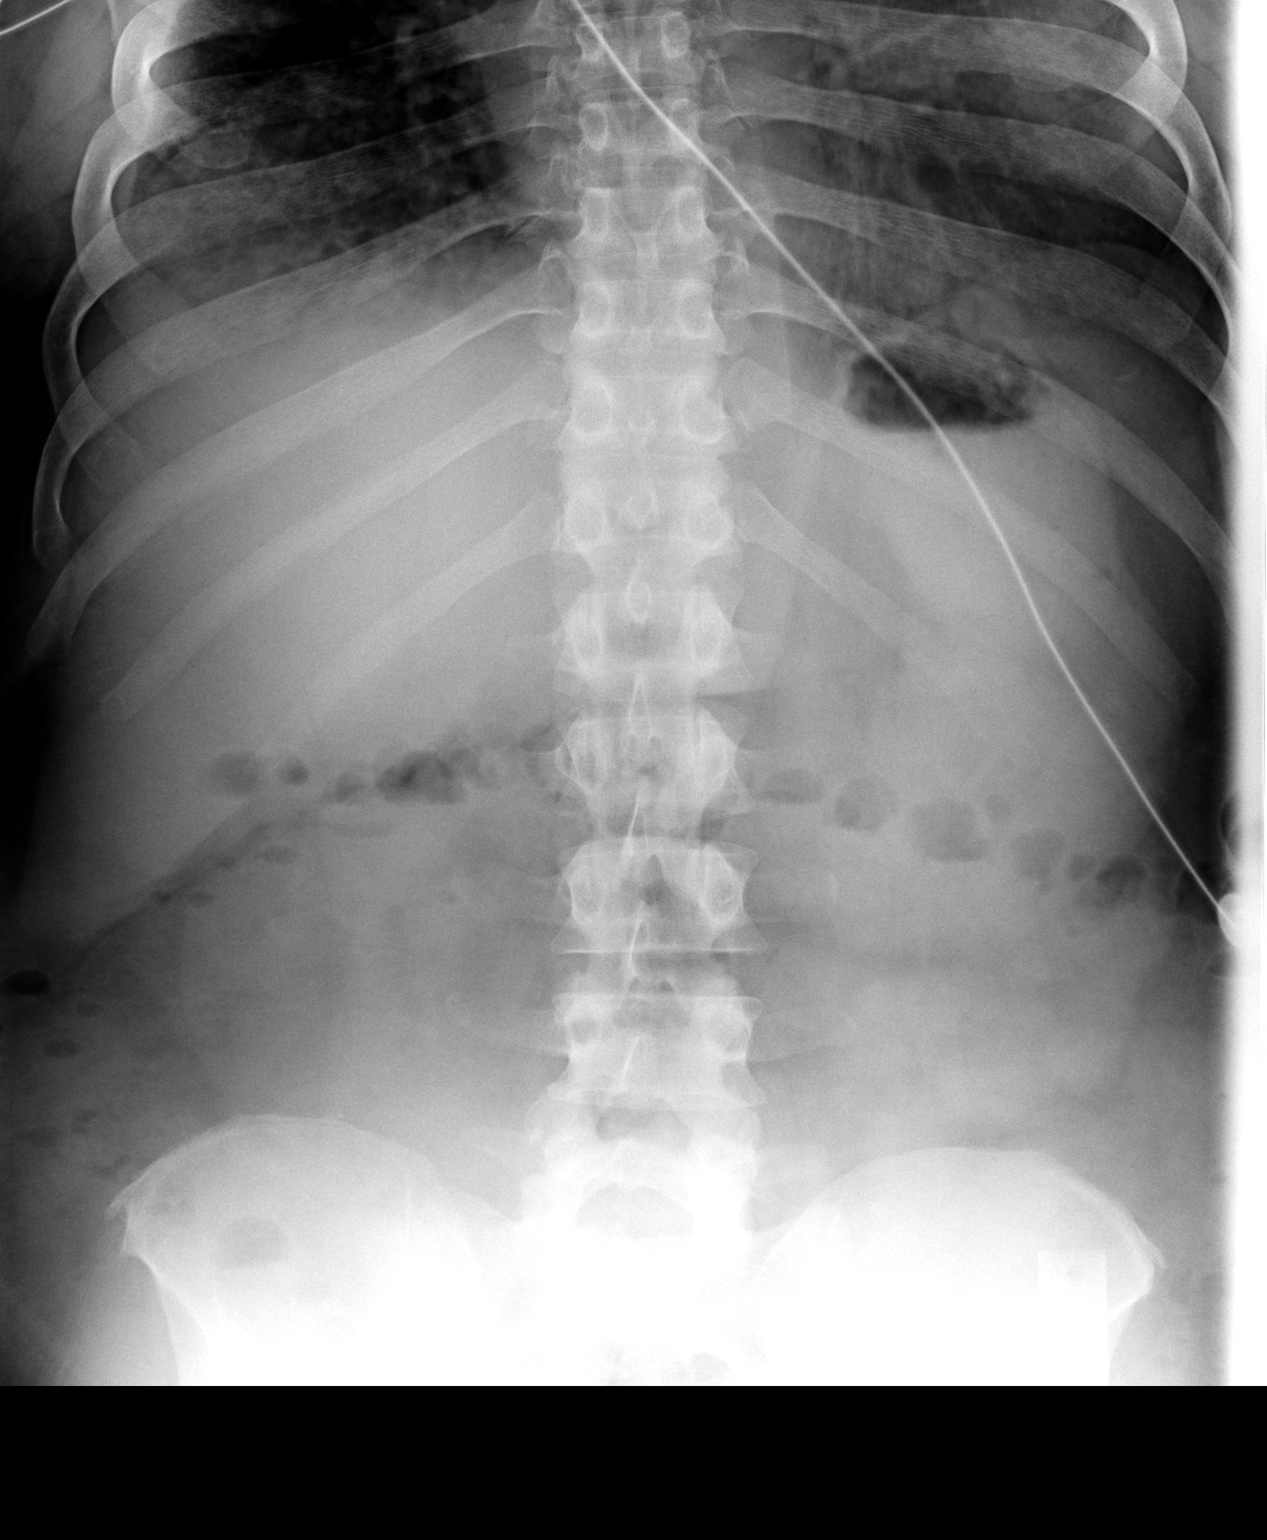

[view not recorded (2 of 2)]
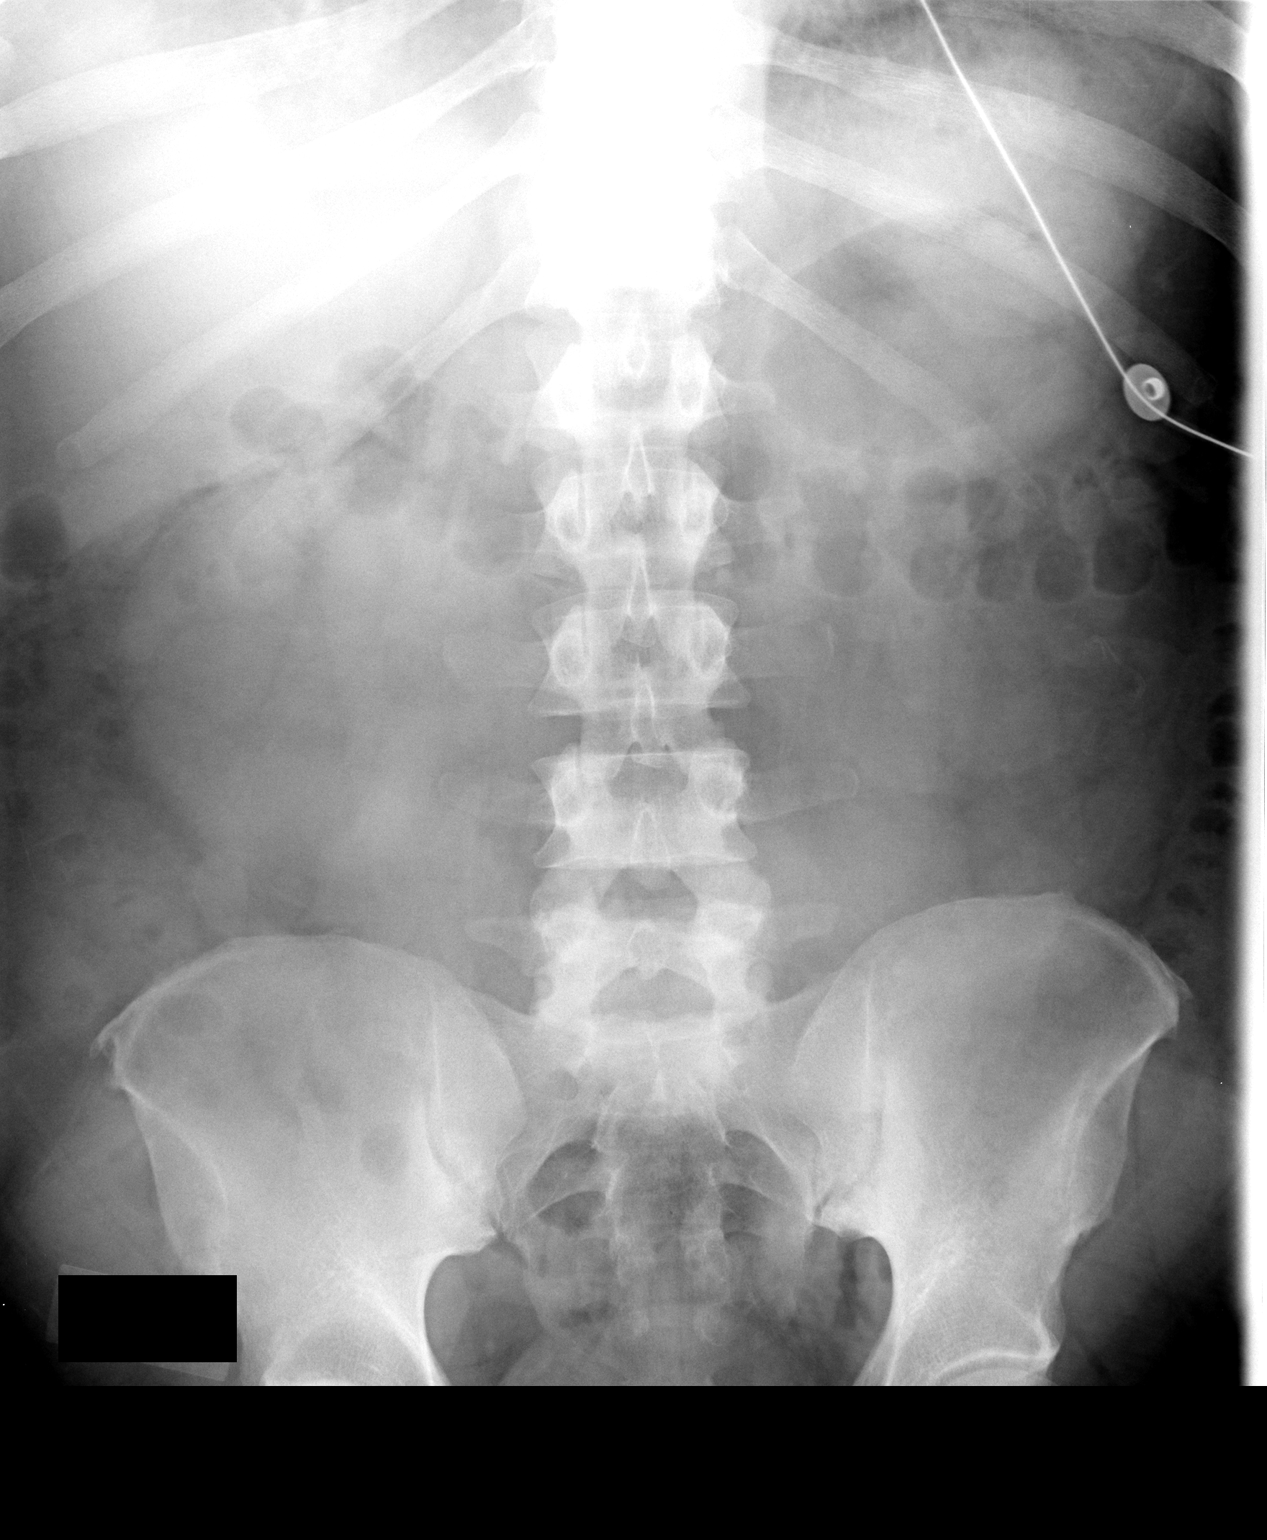

[2 of 2 positions shown; findings below may reference images not displayed]

FINDINGS: There are air fluid levels in the transverse colon, which can be seen in diarrheal disease.  There is a small amount of gas in the ascending and descending colon.  The   bowel is otherwise relatively gasless.
IMPRESSION: Air fluid levels in the colon, potentially a manifestation of diarrheal process.

## 2005-11-15 IMAGING — CR DG CHEST 1V PORT
1 series · 1 of 1 positions shown · non-contrast
Comparison: 12/10/04.

CLINICAL DATA: Chest pain. 
 PORTABLE CHEST ([DATE] HOURS):

[view not recorded]
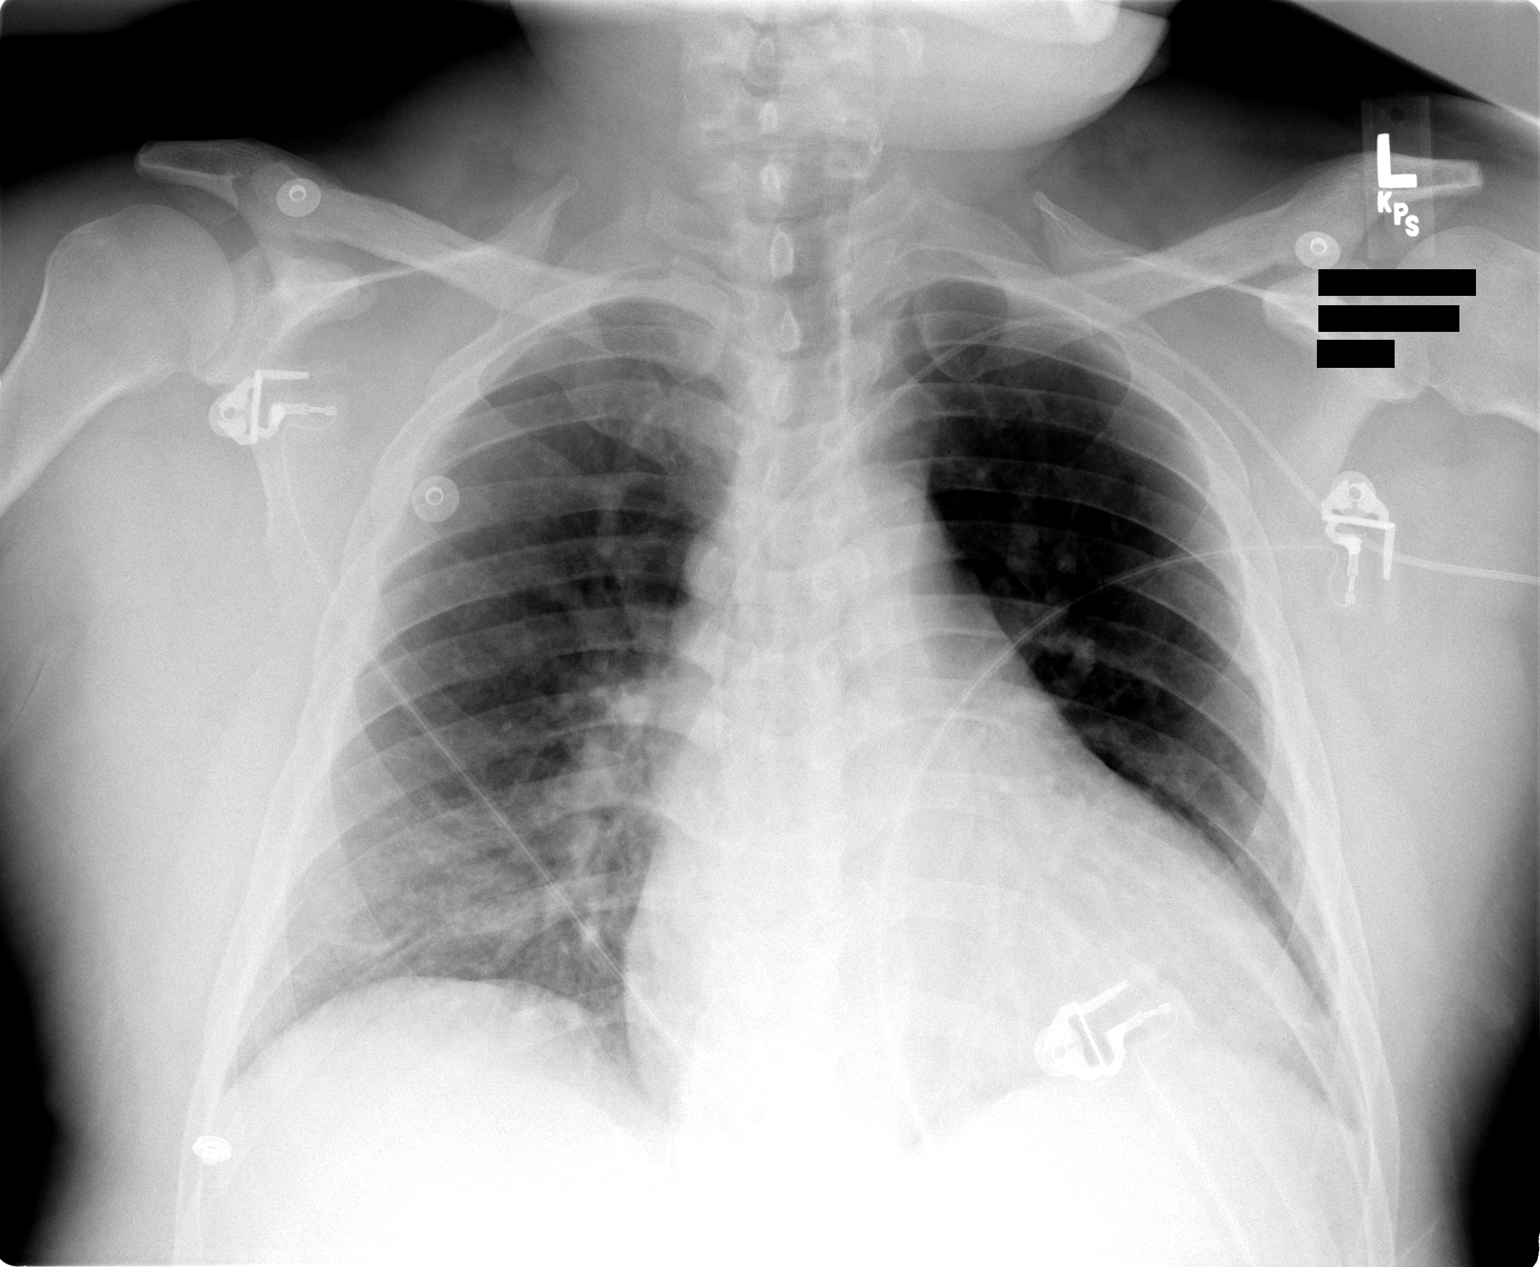

[1 of 1 positions shown; findings below may reference images not displayed]

FINDINGS: The heart remains enlarged.  The pulmonary vasculature is within normal limits.  Bibasilar atelectasis has improved.  The PICC is stable.
IMPRESSION: Improved bibasilar atelectasis.  No CHF.

## 2005-11-16 IMAGING — CR DG CHEST 1V PORT
1 series · 1 of 1 positions shown · non-contrast
Comparison: 12/11/2004

CLINICAL DATA: Chest pain

PORTABLE CHEST - 1 VIEW:

[view not recorded]
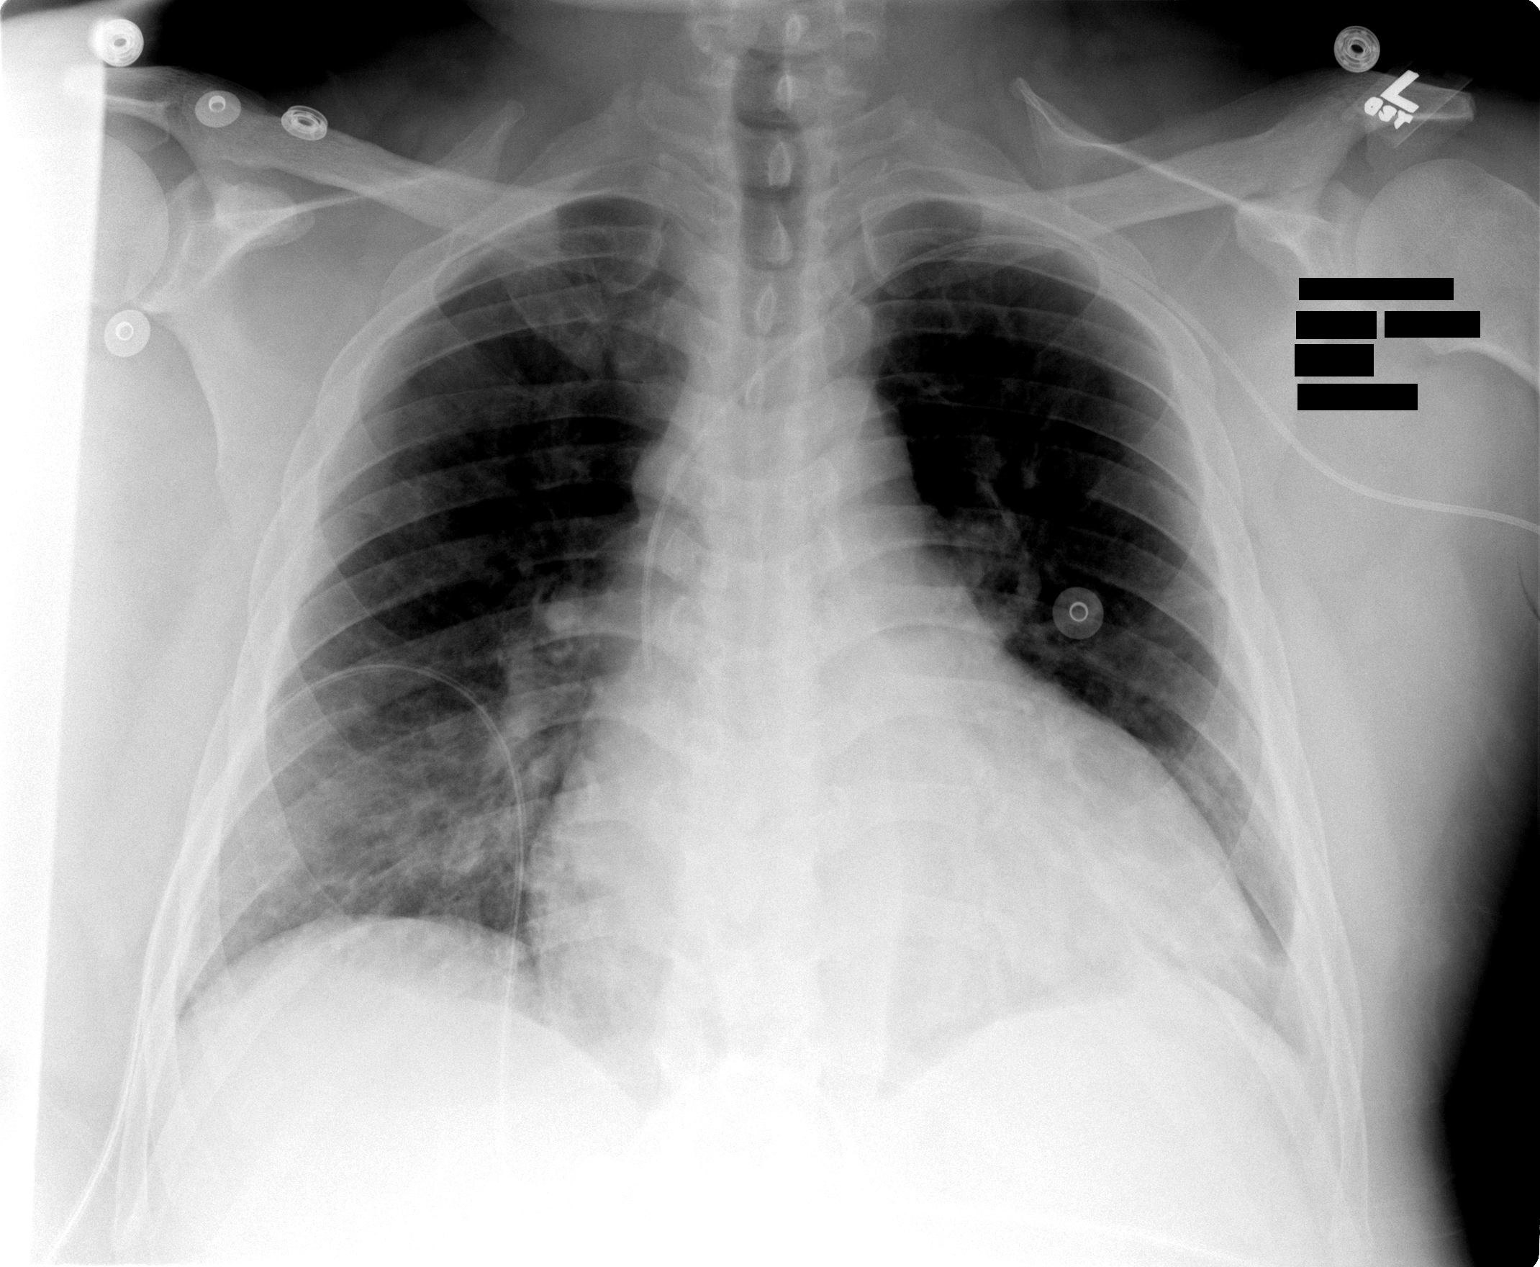

[1 of 1 positions shown; findings below may reference images not displayed]

FINDINGS: Left PICC is unchanged. They're stable cardiomegaly and bibasilar
atelectasis or infiltrates.  New small left effusion.
IMPRESSION: Stable bibasilar opacities and cardiomegaly. New small left effusion.

## 2005-11-16 IMAGING — CR DG CHEST 1V PORT
1 series · 1 of 1 positions shown · non-contrast
Comparison: Earlier the same date.

CLINICAL DATA: Fever and weakness.  Chest pain.
 PORTABLE CHEST ONE VIEW:
 Exam at 6388 hours.

[view not recorded]
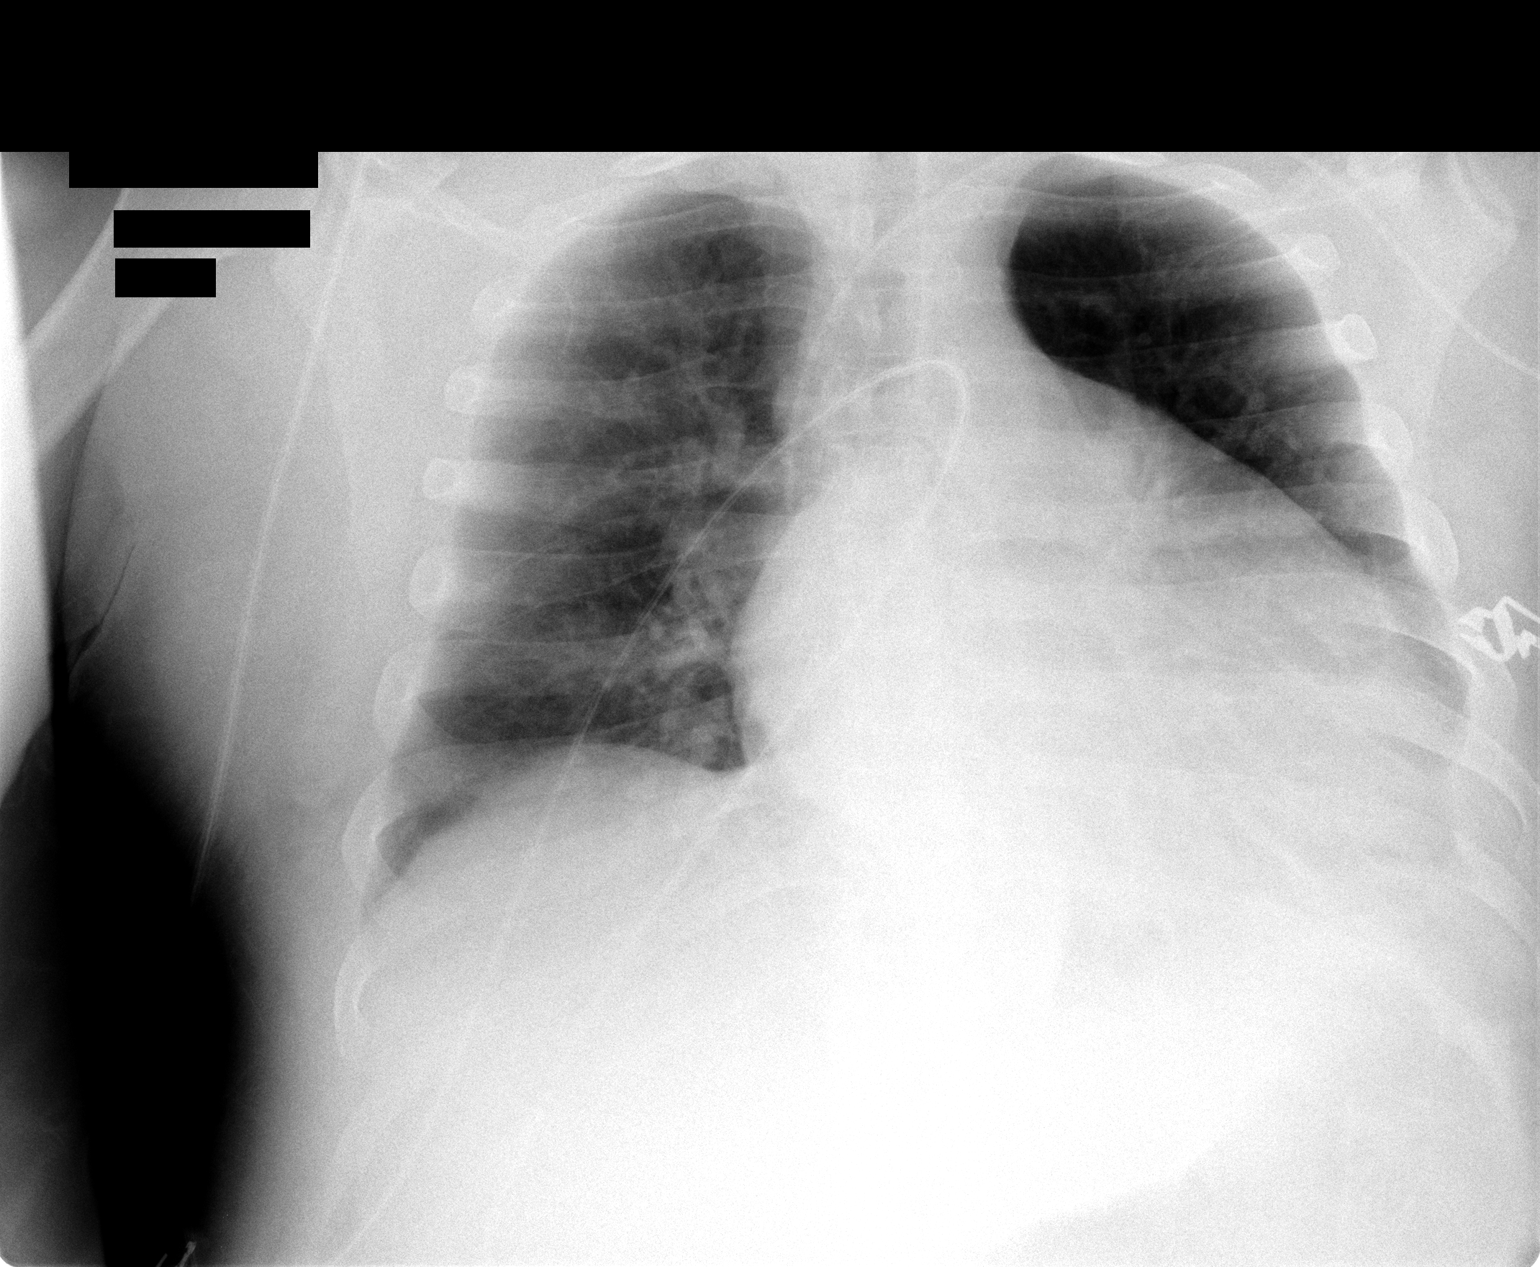

[1 of 1 positions shown; findings below may reference images not displayed]

There is stable cardiac enlargement with a small left pleural effusion.  Patchy basilar opacities are unchanged.  There is no confluent air space opacity.  Left-sided PICC extends into the superior vena cava.
IMPRESSION: No change from earlier today.

## 2005-11-20 IMAGING — CR DG CHEST 2V
2 series · 2 of 2 positions shown · non-contrast
Comparison: none

CLINICAL DATA: Chest pain.  Short of breath.  Cough.
 CHEST - TWO VIEW:
 Two views of the chest are compared to a chest x-ray of 12/12/04.  Moderate cardiomegaly is stable. There is bibasilar linear opacity consistent with atelectasis present.  A left PICC line is noted which is looped in the SVC.  No pneumothorax is seen.

[view not recorded (1 of 2)]
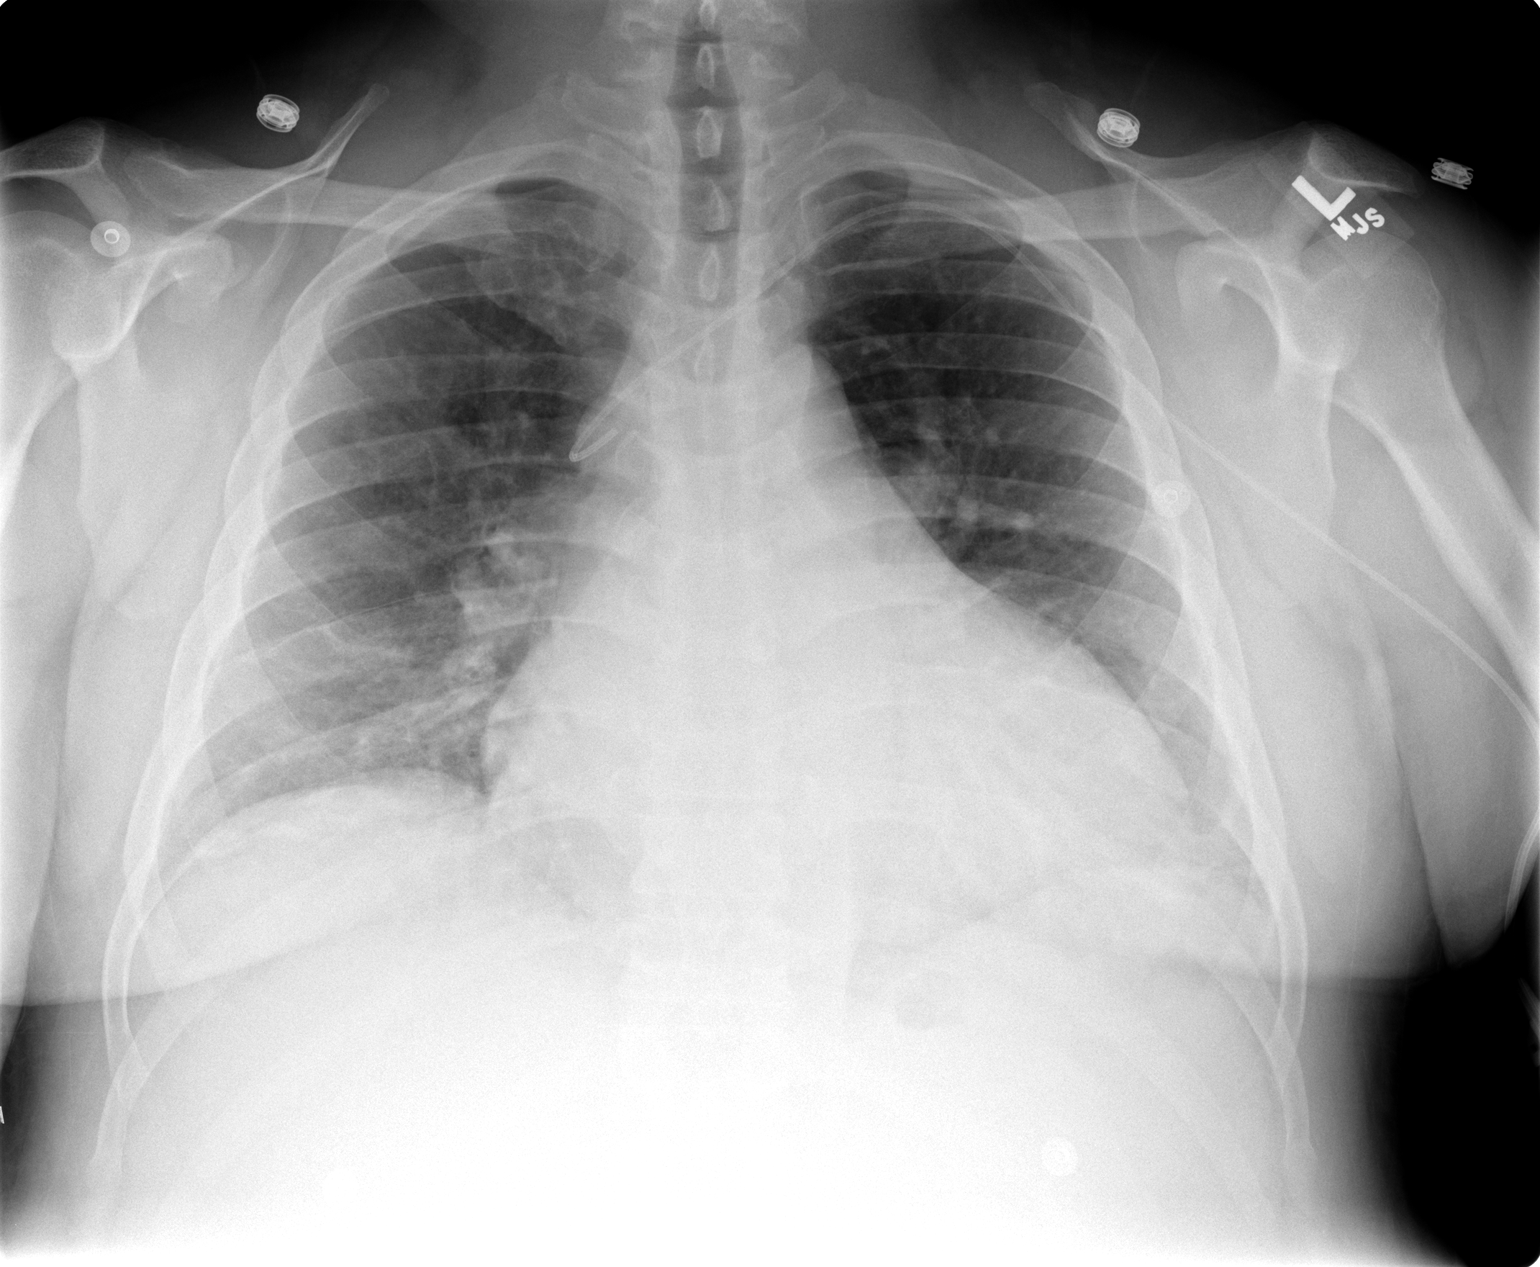

[view not recorded (2 of 2)]
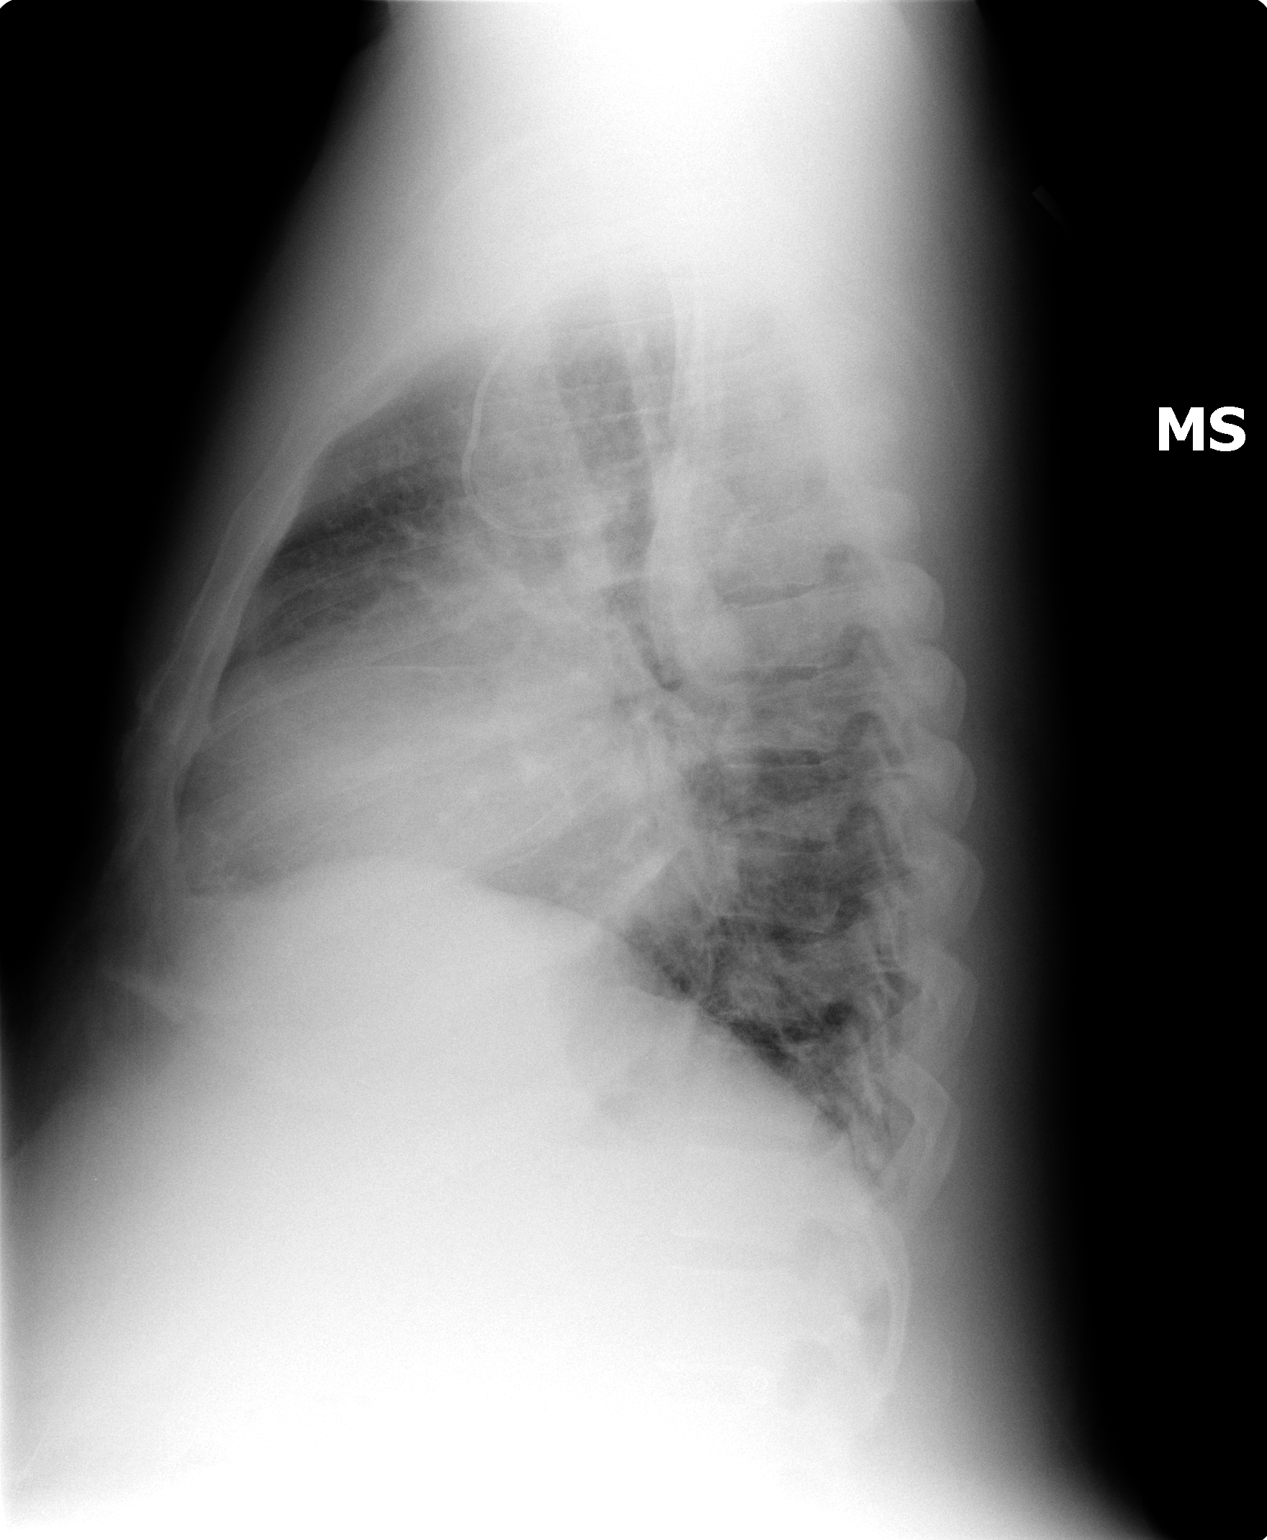

[2 of 2 positions shown; findings below may reference images not displayed]

IMPRESSION: Stable cardiomegaly with bibasilar linear atelectasis.  Left PICC line looped in SVC.

## 2005-11-27 IMAGING — CR DG CHEST 2V
2 series · 2 of 2 positions shown · non-contrast
Comparison: 12/16/04.

CLINICAL DATA: 38-year-old male with shortness of breath.  
 TWO VIEW CHEST:

[view not recorded (1 of 2)]
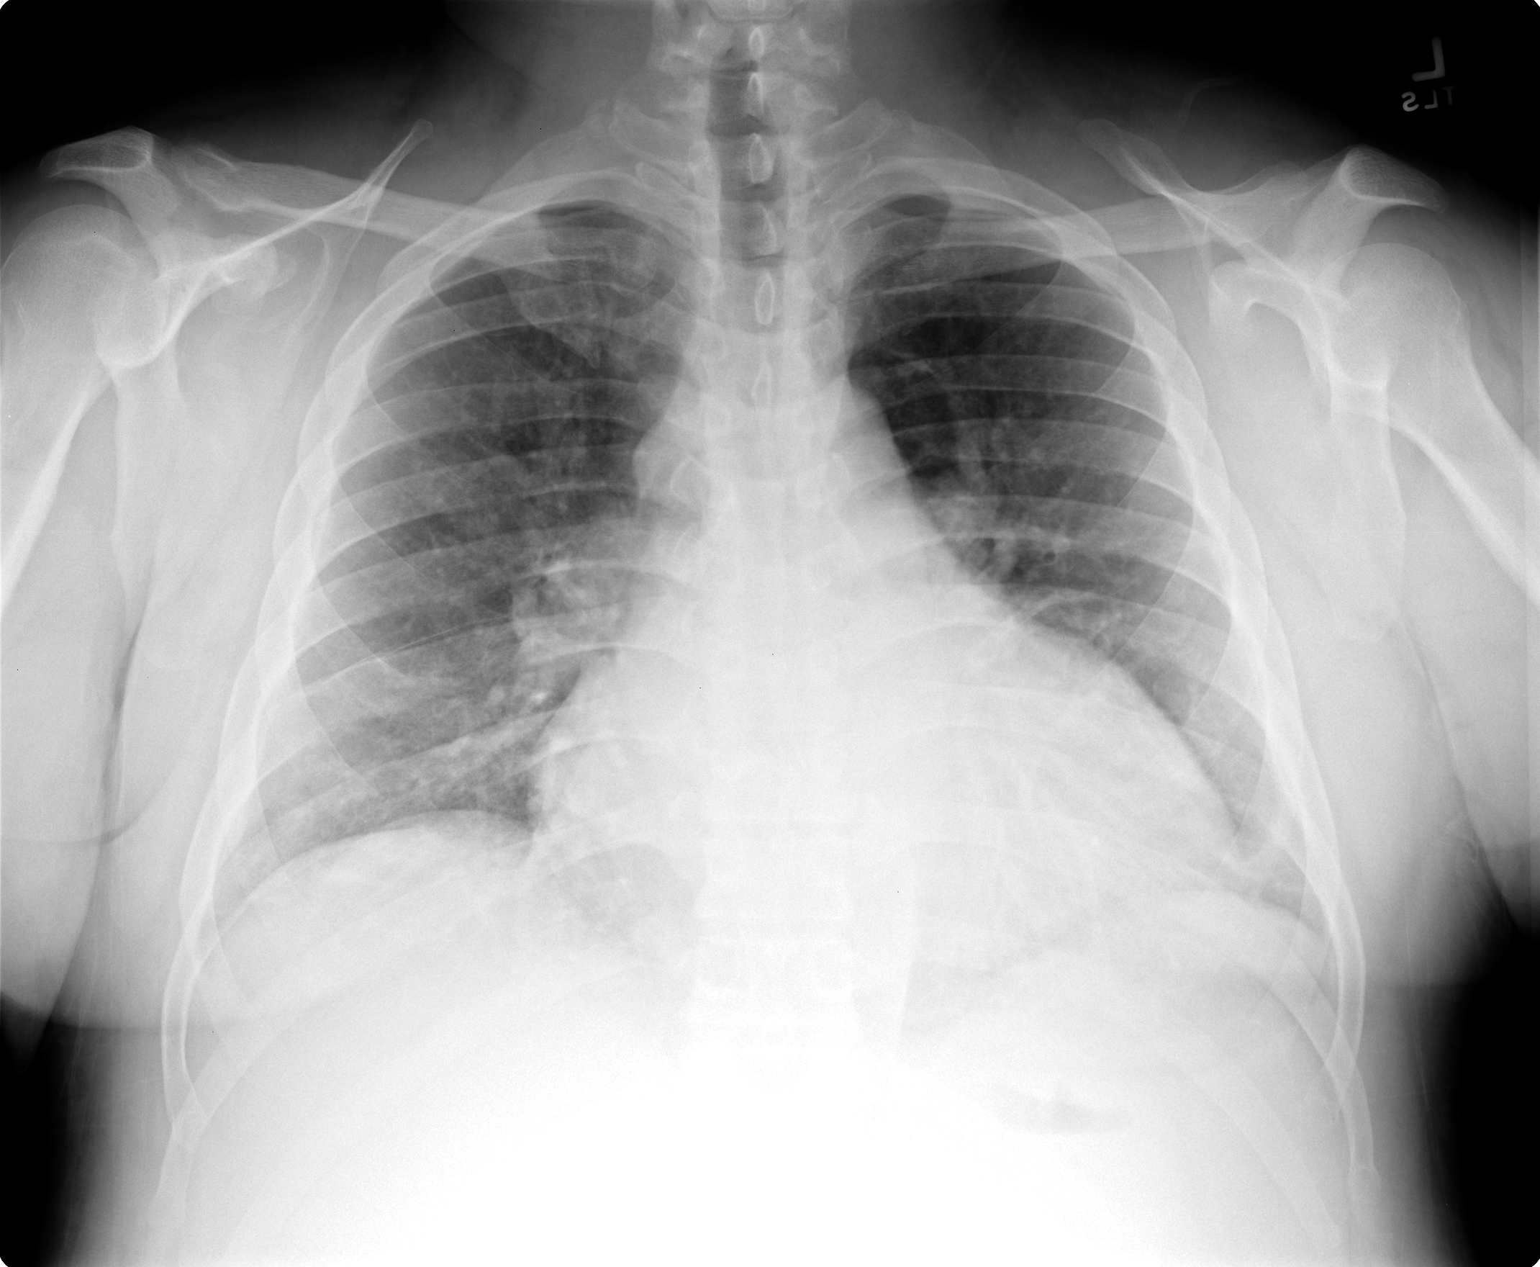

[view not recorded (2 of 2)]
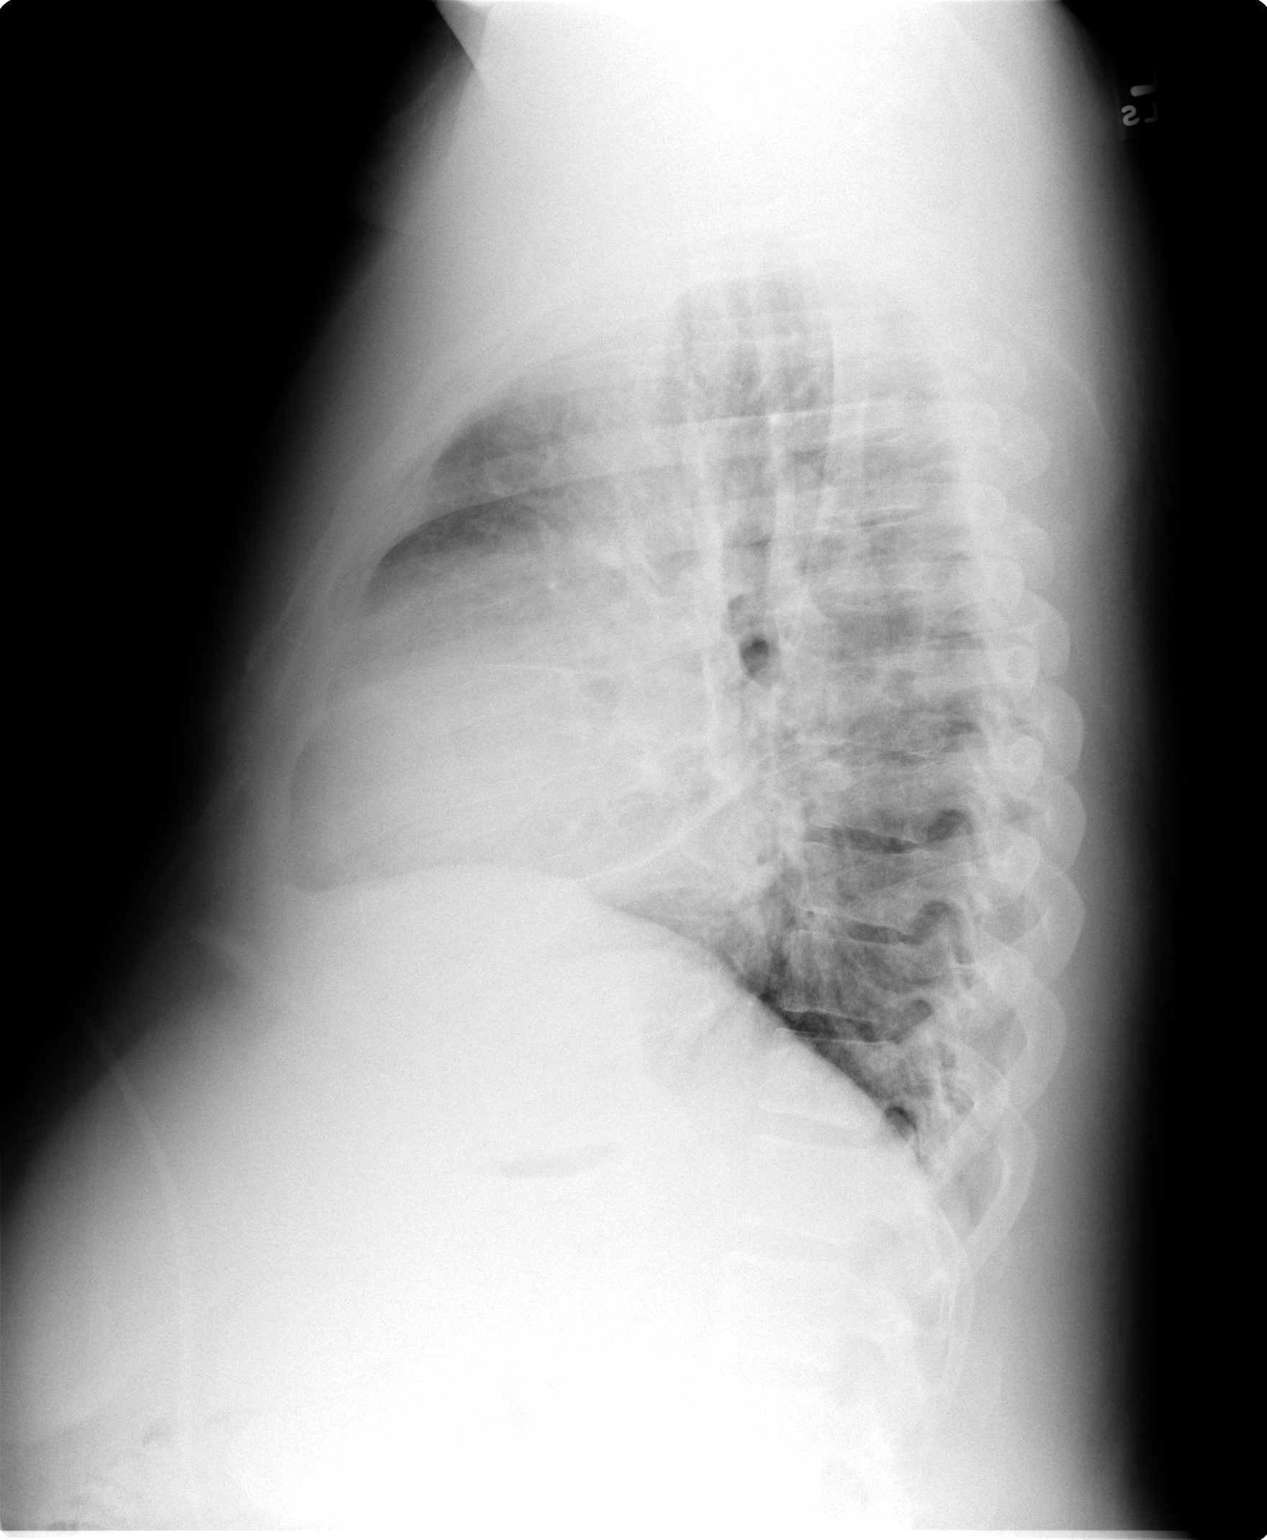

[2 of 2 positions shown; findings below may reference images not displayed]

Left PICC line has been removed.  There is stable cardiac enlargement with slight improvement in bibasilar atelectasis pattern.  No effusion, edema, or pneumothorax.
IMPRESSION: 1.  Stable cardiomegaly with slight improved basilar aeration.  
 2.  Removal of left PICC line.

## 2006-01-23 ENCOUNTER — Emergency Department (HOSPITAL_COMMUNITY): Admission: EM | Admit: 2006-01-23 | Discharge: 2006-01-23 | Payer: Self-pay | Admitting: Emergency Medicine

## 2006-02-26 ENCOUNTER — Encounter: Admission: RE | Admit: 2006-02-26 | Discharge: 2006-02-26 | Payer: Self-pay | Admitting: Infectious Diseases

## 2006-05-10 IMAGING — CR DG CHEST 2V
2 series · 2 of 2 positions shown · non-contrast
Comparison: none

CLINICAL DATA: Cough, congestion.  
 CHEST ? 2 VIEW:
 Two views of the chest are compared to a chest x-ray of 12/23/04.   Aeration has improved as has basilar atelectasis.   Cardiomegaly is present.   No effusion is seen.

[view not recorded (1 of 2)]
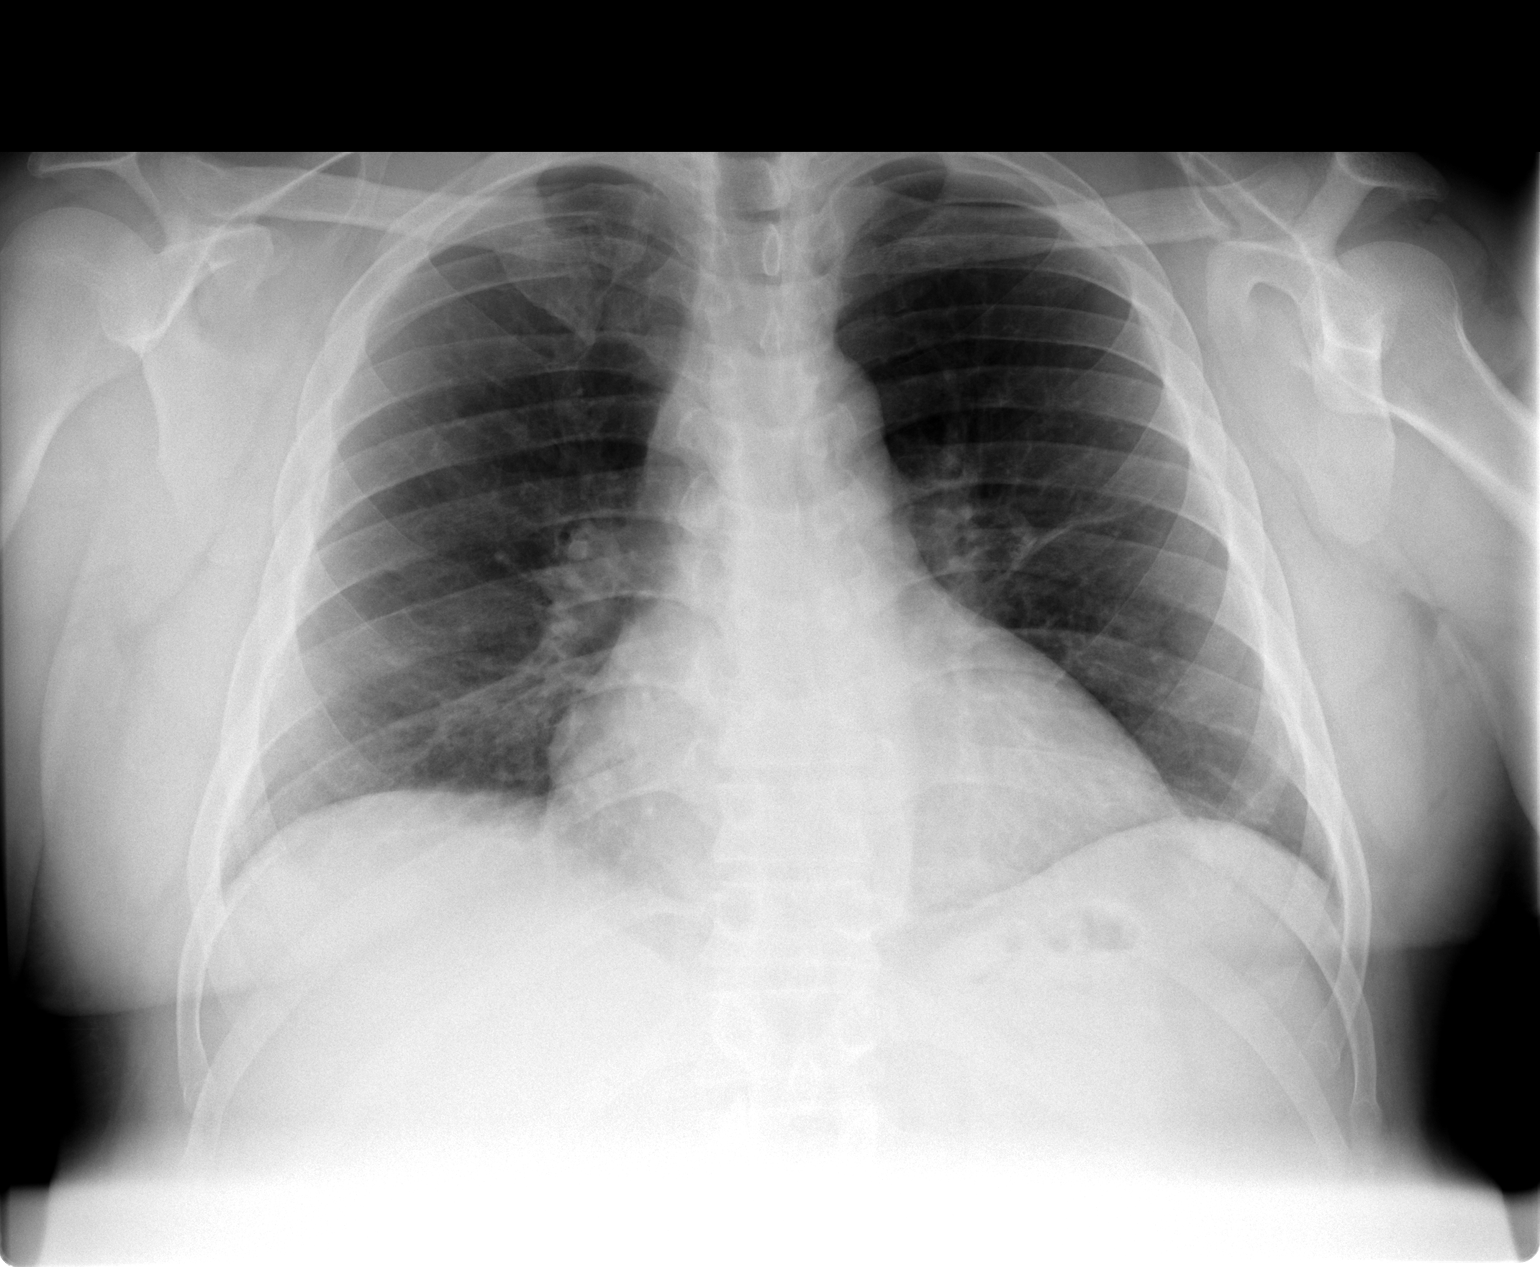

[view not recorded (2 of 2)]
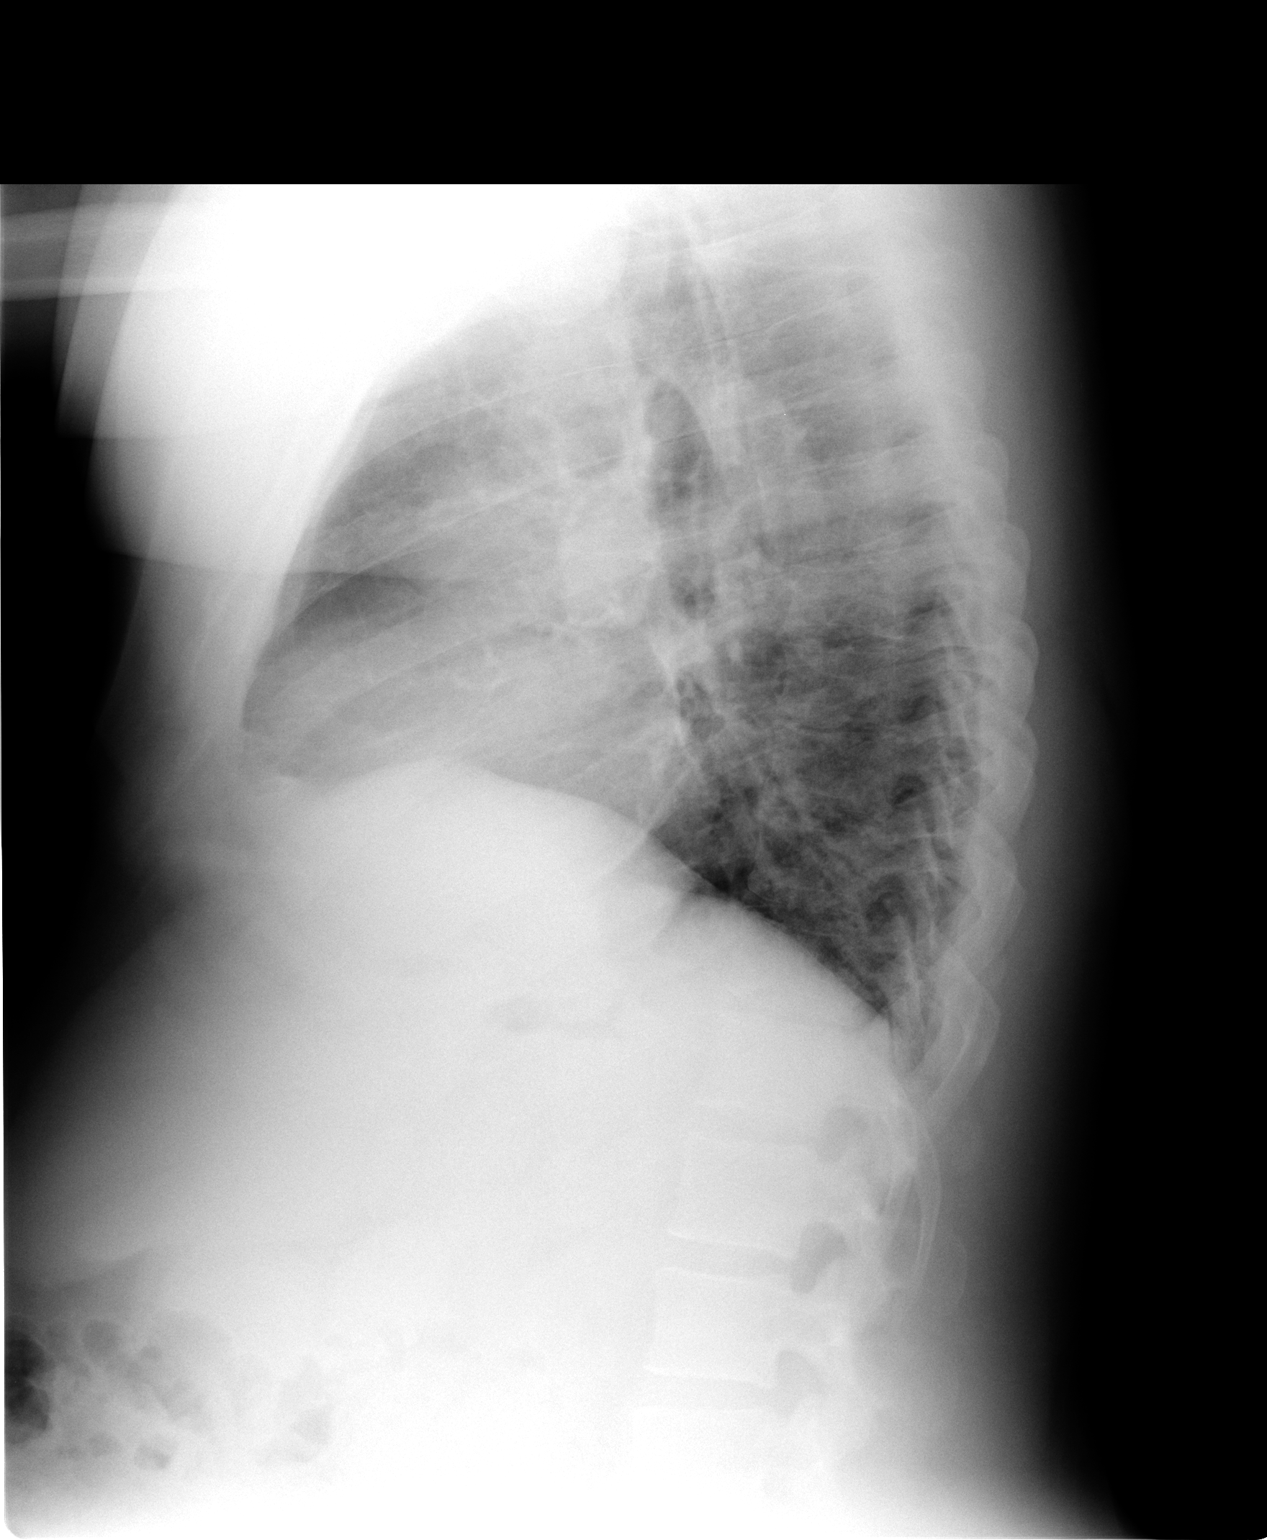

[2 of 2 positions shown; findings below may reference images not displayed]

IMPRESSION: Improved aeration.   Stable cardiomegaly.

## 2008-02-16 ENCOUNTER — Inpatient Hospital Stay (HOSPITAL_COMMUNITY): Admission: EM | Admit: 2008-02-16 | Discharge: 2008-02-18 | Payer: Self-pay | Admitting: Emergency Medicine

## 2008-02-17 ENCOUNTER — Encounter (INDEPENDENT_AMBULATORY_CARE_PROVIDER_SITE_OTHER): Payer: Self-pay | Admitting: Gastroenterology

## 2008-04-06 ENCOUNTER — Ambulatory Visit: Payer: Self-pay | Admitting: Internal Medicine

## 2008-04-11 ENCOUNTER — Ambulatory Visit: Payer: Self-pay | Admitting: Internal Medicine

## 2008-04-11 LAB — CONVERTED CEMR LAB
BUN: 24 mg/dL — ABNORMAL HIGH (ref 6–23)
CO2: 29 meq/L (ref 19–32)
Calcium: 10.1 mg/dL (ref 8.4–10.5)
Chloride: 96 meq/L (ref 96–112)
Creatinine, Ser: 1.2 mg/dL (ref 0.4–1.5)
GFR calc Af Amer: 85 mL/min
GFR calc non Af Amer: 71 mL/min
Glucose, Bld: 194 mg/dL — ABNORMAL HIGH (ref 70–99)
Potassium: 4.3 meq/L (ref 3.5–5.1)
Sodium: 135 meq/L (ref 135–145)

## 2009-05-03 ENCOUNTER — Encounter: Payer: Self-pay | Admitting: Internal Medicine

## 2009-05-22 ENCOUNTER — Telehealth (INDEPENDENT_AMBULATORY_CARE_PROVIDER_SITE_OTHER): Payer: Self-pay | Admitting: *Deleted

## 2009-07-18 ENCOUNTER — Encounter: Payer: Self-pay | Admitting: Internal Medicine

## 2009-08-07 ENCOUNTER — Telehealth: Payer: Self-pay | Admitting: Internal Medicine

## 2009-09-11 ENCOUNTER — Telehealth: Payer: Self-pay | Admitting: Internal Medicine

## 2009-09-14 ENCOUNTER — Telehealth: Payer: Self-pay | Admitting: Internal Medicine

## 2009-09-24 ENCOUNTER — Ambulatory Visit: Payer: Self-pay | Admitting: Internal Medicine

## 2009-09-24 DIAGNOSIS — R609 Edema, unspecified: Secondary | ICD-10-CM | POA: Insufficient documentation

## 2009-09-24 DIAGNOSIS — I509 Heart failure, unspecified: Secondary | ICD-10-CM | POA: Insufficient documentation

## 2009-09-24 DIAGNOSIS — N529 Male erectile dysfunction, unspecified: Secondary | ICD-10-CM | POA: Insufficient documentation

## 2009-09-24 DIAGNOSIS — M109 Gout, unspecified: Secondary | ICD-10-CM | POA: Insufficient documentation

## 2009-09-24 DIAGNOSIS — I5022 Chronic systolic (congestive) heart failure: Secondary | ICD-10-CM | POA: Insufficient documentation

## 2009-09-24 DIAGNOSIS — F17200 Nicotine dependence, unspecified, uncomplicated: Secondary | ICD-10-CM | POA: Insufficient documentation

## 2009-10-09 ENCOUNTER — Emergency Department (HOSPITAL_COMMUNITY): Admission: EM | Admit: 2009-10-09 | Discharge: 2009-10-09 | Payer: Self-pay | Admitting: Emergency Medicine

## 2009-10-09 ENCOUNTER — Emergency Department (HOSPITAL_COMMUNITY): Admission: EM | Admit: 2009-10-09 | Discharge: 2009-10-09 | Payer: Self-pay | Admitting: Family Medicine

## 2009-10-25 ENCOUNTER — Ambulatory Visit: Payer: Self-pay | Admitting: Internal Medicine

## 2009-10-25 ENCOUNTER — Encounter: Payer: Self-pay | Admitting: Internal Medicine

## 2009-10-25 ENCOUNTER — Ambulatory Visit: Payer: Self-pay

## 2009-10-25 ENCOUNTER — Ambulatory Visit (HOSPITAL_COMMUNITY): Admission: RE | Admit: 2009-10-25 | Discharge: 2009-10-25 | Payer: Self-pay | Admitting: Internal Medicine

## 2009-10-25 ENCOUNTER — Inpatient Hospital Stay (HOSPITAL_COMMUNITY): Admission: AD | Admit: 2009-10-25 | Discharge: 2009-11-06 | Payer: Self-pay | Admitting: Internal Medicine

## 2009-10-25 DIAGNOSIS — I5023 Acute on chronic systolic (congestive) heart failure: Secondary | ICD-10-CM | POA: Insufficient documentation

## 2009-10-26 ENCOUNTER — Encounter: Payer: Self-pay | Admitting: Internal Medicine

## 2009-10-27 ENCOUNTER — Encounter: Payer: Self-pay | Admitting: Internal Medicine

## 2009-10-30 ENCOUNTER — Encounter: Payer: Self-pay | Admitting: Internal Medicine

## 2009-10-31 ENCOUNTER — Encounter: Payer: Self-pay | Admitting: Internal Medicine

## 2009-11-09 ENCOUNTER — Telehealth: Payer: Self-pay | Admitting: Internal Medicine

## 2009-11-12 ENCOUNTER — Encounter: Payer: Self-pay | Admitting: Internal Medicine

## 2009-11-14 ENCOUNTER — Ambulatory Visit: Payer: Self-pay | Admitting: Internal Medicine

## 2009-11-15 ENCOUNTER — Encounter: Payer: Self-pay | Admitting: Internal Medicine

## 2009-11-20 ENCOUNTER — Inpatient Hospital Stay (HOSPITAL_COMMUNITY): Admission: RE | Admit: 2009-11-20 | Discharge: 2009-11-21 | Payer: Self-pay | Admitting: Internal Medicine

## 2009-11-20 ENCOUNTER — Ambulatory Visit: Payer: Self-pay | Admitting: Internal Medicine

## 2009-11-21 ENCOUNTER — Encounter: Payer: Self-pay | Admitting: Internal Medicine

## 2009-11-21 ENCOUNTER — Telehealth (INDEPENDENT_AMBULATORY_CARE_PROVIDER_SITE_OTHER): Payer: Self-pay | Admitting: *Deleted

## 2009-12-03 ENCOUNTER — Ambulatory Visit: Payer: Self-pay | Admitting: Internal Medicine

## 2009-12-03 ENCOUNTER — Telehealth (INDEPENDENT_AMBULATORY_CARE_PROVIDER_SITE_OTHER): Payer: Self-pay | Admitting: *Deleted

## 2009-12-07 ENCOUNTER — Encounter: Payer: Self-pay | Admitting: Internal Medicine

## 2009-12-10 ENCOUNTER — Telehealth: Payer: Self-pay | Admitting: Internal Medicine

## 2010-01-02 ENCOUNTER — Ambulatory Visit: Payer: Self-pay | Admitting: Internal Medicine

## 2010-01-02 LAB — CONVERTED CEMR LAB
ALT: 20 units/L (ref 0–53)
AST: 28 units/L (ref 0–37)
Albumin: 4.2 g/dL (ref 3.5–5.2)
Alkaline Phosphatase: 148 units/L — ABNORMAL HIGH (ref 39–117)
BUN: 25 mg/dL — ABNORMAL HIGH (ref 6–23)
Bilirubin, Direct: 0.5 mg/dL — ABNORMAL HIGH (ref 0.0–0.3)
CO2: 26 meq/L (ref 19–32)
Calcium: 9.7 mg/dL (ref 8.4–10.5)
Chloride: 101 meq/L (ref 96–112)
Creatinine, Ser: 1 mg/dL (ref 0.4–1.5)
GFR calc non Af Amer: 104.1 mL/min (ref >60)
Glucose, Bld: 129 mg/dL — ABNORMAL HIGH (ref 70–99)
Potassium: 3.7 meq/L (ref 3.5–5.1)
Pro B Natriuretic peptide (BNP): 607 pg/mL — ABNORMAL HIGH (ref 0.0–100.0)
Sodium: 137 meq/L (ref 135–145)
Total Bilirubin: 1.5 mg/dL — ABNORMAL HIGH (ref 0.3–1.2)
Total Protein: 8.2 g/dL (ref 6.0–8.3)

## 2010-01-03 ENCOUNTER — Telehealth: Payer: Self-pay | Admitting: Internal Medicine

## 2010-01-11 LAB — CONVERTED CEMR LAB

## 2010-01-22 ENCOUNTER — Telehealth: Payer: Self-pay | Admitting: Internal Medicine

## 2010-01-24 ENCOUNTER — Telehealth: Payer: Self-pay | Admitting: Internal Medicine

## 2010-01-24 ENCOUNTER — Ambulatory Visit: Payer: Self-pay | Admitting: Internal Medicine

## 2010-01-30 ENCOUNTER — Ambulatory Visit: Payer: Self-pay | Admitting: Internal Medicine

## 2010-01-30 LAB — CONVERTED CEMR LAB
BUN: 37 mg/dL — ABNORMAL HIGH (ref 6–23)
CO2: 29 meq/L (ref 19–32)
Calcium: 10 mg/dL (ref 8.4–10.5)
Chloride: 101 meq/L (ref 96–112)
Creatinine, Ser: 1.1 mg/dL (ref 0.4–1.5)
GFR calc non Af Amer: 93.23 mL/min (ref >60)
Glucose, Bld: 135 mg/dL — ABNORMAL HIGH (ref 70–99)
Potassium: 5 meq/L (ref 3.5–5.1)
Pro B Natriuretic peptide (BNP): 446 pg/mL — ABNORMAL HIGH (ref 0.0–100.0)
Sodium: 138 meq/L (ref 135–145)

## 2010-02-05 LAB — CONVERTED CEMR LAB
Amphetamine Screen, Ur: NEGATIVE
Barbiturate Quant, Ur: NEGATIVE
Benzodiazepines.: NEGATIVE
Cocaine Metabolites: NEGATIVE
Creatinine,U: 98.1 mg/dL
Marijuana Metabolite: POSITIVE — AB
Methadone: NEGATIVE
Opiate Screen, Urine: NEGATIVE
Phencyclidine (PCP): NEGATIVE
Propoxyphene: NEGATIVE

## 2010-02-18 ENCOUNTER — Telehealth (INDEPENDENT_AMBULATORY_CARE_PROVIDER_SITE_OTHER): Payer: Self-pay | Admitting: *Deleted

## 2010-02-19 ENCOUNTER — Ambulatory Visit: Payer: Self-pay | Admitting: Internal Medicine

## 2010-02-28 ENCOUNTER — Telehealth: Payer: Self-pay | Admitting: Internal Medicine

## 2010-03-18 ENCOUNTER — Ambulatory Visit: Payer: Self-pay | Admitting: Internal Medicine

## 2010-03-18 DIAGNOSIS — I4729 Other ventricular tachycardia: Secondary | ICD-10-CM | POA: Insufficient documentation

## 2010-03-18 DIAGNOSIS — I472 Ventricular tachycardia: Secondary | ICD-10-CM | POA: Insufficient documentation

## 2010-03-21 ENCOUNTER — Telehealth: Payer: Self-pay | Admitting: Internal Medicine

## 2010-03-29 ENCOUNTER — Telehealth: Payer: Self-pay | Admitting: Internal Medicine

## 2010-03-29 ENCOUNTER — Encounter: Payer: Self-pay | Admitting: Internal Medicine

## 2010-04-01 ENCOUNTER — Ambulatory Visit: Payer: Self-pay | Admitting: Internal Medicine

## 2010-04-08 ENCOUNTER — Ambulatory Visit: Payer: Self-pay | Admitting: Internal Medicine

## 2010-04-08 LAB — CONVERTED CEMR LAB
Amphetamine Screen, Ur: NEGATIVE
Barbiturate Quant, Ur: NEGATIVE
Benzodiazepines.: NEGATIVE
Cocaine Metabolites: NEGATIVE
Creatinine,U: 70.4 mg/dL
Marijuana Metabolite: POSITIVE — AB
Methadone: NEGATIVE
Opiate Screen, Urine: NEGATIVE
Phencyclidine (PCP): NEGATIVE
Propoxyphene: NEGATIVE

## 2010-04-18 ENCOUNTER — Encounter: Payer: Self-pay | Admitting: Internal Medicine

## 2010-04-23 ENCOUNTER — Telehealth (INDEPENDENT_AMBULATORY_CARE_PROVIDER_SITE_OTHER): Payer: Self-pay | Admitting: *Deleted

## 2010-05-01 ENCOUNTER — Ambulatory Visit: Payer: Self-pay | Admitting: Internal Medicine

## 2010-05-13 ENCOUNTER — Telehealth (INDEPENDENT_AMBULATORY_CARE_PROVIDER_SITE_OTHER): Payer: Self-pay | Admitting: *Deleted

## 2010-05-21 ENCOUNTER — Encounter: Payer: Self-pay | Admitting: Internal Medicine

## 2010-05-28 ENCOUNTER — Encounter: Payer: Self-pay | Admitting: Internal Medicine

## 2010-05-30 ENCOUNTER — Ambulatory Visit: Payer: Self-pay | Admitting: Internal Medicine

## 2010-05-30 ENCOUNTER — Encounter: Payer: Self-pay | Admitting: Internal Medicine

## 2010-05-30 LAB — CONVERTED CEMR LAB

## 2010-06-07 ENCOUNTER — Ambulatory Visit: Payer: Self-pay | Admitting: Internal Medicine

## 2010-06-07 ENCOUNTER — Encounter: Payer: Self-pay | Admitting: Internal Medicine

## 2010-06-10 ENCOUNTER — Telehealth: Payer: Self-pay | Admitting: Internal Medicine

## 2010-06-12 LAB — CONVERTED CEMR LAB
BUN: 59 mg/dL — ABNORMAL HIGH (ref 6–23)
CO2: 25 meq/L (ref 19–32)
Calcium: 9.5 mg/dL (ref 8.4–10.5)
Chloride: 105 meq/L (ref 96–112)
Creatinine, Ser: 2.1 mg/dL — ABNORMAL HIGH (ref 0.4–1.5)
GFR calc non Af Amer: 45.12 mL/min (ref >60)
Glucose, Bld: 134 mg/dL — ABNORMAL HIGH (ref 70–99)
Potassium: 4.5 meq/L (ref 3.5–5.1)
Pro B Natriuretic peptide (BNP): 462 pg/mL — ABNORMAL HIGH (ref 0.0–100.0)
Sodium: 137 meq/L (ref 135–145)

## 2010-06-18 ENCOUNTER — Ambulatory Visit: Payer: Self-pay | Admitting: Internal Medicine

## 2010-06-19 ENCOUNTER — Telehealth: Payer: Self-pay | Admitting: Internal Medicine

## 2010-06-20 LAB — CONVERTED CEMR LAB
BUN: 95 mg/dL (ref 6–23)
CO2: 19 meq/L (ref 19–32)
Calcium: 9.3 mg/dL (ref 8.4–10.5)
Chloride: 107 meq/L (ref 96–112)
Creatinine, Ser: 3 mg/dL — ABNORMAL HIGH (ref 0.4–1.5)
GFR calc non Af Amer: 29.7 mL/min (ref >60)
Glucose, Bld: 157 mg/dL — ABNORMAL HIGH (ref 70–99)
Potassium: 5.5 meq/L — ABNORMAL HIGH (ref 3.5–5.1)
Sodium: 136 meq/L (ref 135–145)

## 2010-06-21 ENCOUNTER — Ambulatory Visit: Payer: Self-pay | Admitting: Internal Medicine

## 2010-06-25 LAB — CONVERTED CEMR LAB
BUN: 78 mg/dL — ABNORMAL HIGH (ref 6–23)
CO2: 17 meq/L — ABNORMAL LOW (ref 19–32)
Calcium: 9.4 mg/dL (ref 8.4–10.5)
Chloride: 108 meq/L (ref 96–112)
Creatinine, Ser: 2.6 mg/dL — ABNORMAL HIGH (ref 0.4–1.5)
GFR calc non Af Amer: 34.49 mL/min (ref >60)
Glucose, Bld: 123 mg/dL — ABNORMAL HIGH (ref 70–99)
Potassium: 5.3 meq/L — ABNORMAL HIGH (ref 3.5–5.1)
Sodium: 133 meq/L — ABNORMAL LOW (ref 135–145)

## 2010-06-26 ENCOUNTER — Ambulatory Visit: Payer: Self-pay | Admitting: Sports Medicine

## 2010-06-26 ENCOUNTER — Telehealth: Payer: Self-pay | Admitting: Internal Medicine

## 2010-06-26 ENCOUNTER — Ambulatory Visit: Payer: Self-pay | Admitting: Internal Medicine

## 2010-06-26 ENCOUNTER — Encounter: Payer: Self-pay | Admitting: Family Medicine

## 2010-06-26 DIAGNOSIS — G5600 Carpal tunnel syndrome, unspecified upper limb: Secondary | ICD-10-CM | POA: Insufficient documentation

## 2010-07-03 ENCOUNTER — Ambulatory Visit: Payer: Self-pay | Admitting: Internal Medicine

## 2010-07-03 LAB — CONVERTED CEMR LAB

## 2010-07-08 ENCOUNTER — Ambulatory Visit: Payer: Self-pay | Admitting: Internal Medicine

## 2010-07-08 DIAGNOSIS — R079 Chest pain, unspecified: Secondary | ICD-10-CM | POA: Insufficient documentation

## 2010-07-09 ENCOUNTER — Inpatient Hospital Stay (HOSPITAL_BASED_OUTPATIENT_CLINIC_OR_DEPARTMENT_OTHER): Admission: RE | Admit: 2010-07-09 | Discharge: 2010-07-09 | Payer: Self-pay | Admitting: Internal Medicine

## 2010-07-11 LAB — CONVERTED CEMR LAB
BUN: 68 mg/dL — ABNORMAL HIGH (ref 6–23)
Basophils Absolute: 0 10*3/uL (ref 0.0–0.1)
Basophils Relative: 0.3 % (ref 0.0–3.0)
CO2: 25 meq/L (ref 19–32)
Calcium: 10 mg/dL (ref 8.4–10.5)
Chloride: 103 meq/L (ref 96–112)
Creatinine, Ser: 1.9 mg/dL — ABNORMAL HIGH (ref 0.4–1.5)
Eosinophils Absolute: 0.2 10*3/uL (ref 0.0–0.7)
Eosinophils Relative: 3.5 % (ref 0.0–5.0)
GFR calc non Af Amer: 48.63 mL/min (ref >60)
Glucose, Bld: 148 mg/dL — ABNORMAL HIGH (ref 70–99)
HCT: 37.2 % — ABNORMAL LOW (ref 39.0–52.0)
Hemoglobin: 12.5 g/dL — ABNORMAL LOW (ref 13.0–17.0)
INR: 1 (ref 0.8–1.0)
Lymphocytes Relative: 10.5 % — ABNORMAL LOW (ref 12.0–46.0)
Lymphs Abs: 0.6 10*3/uL — ABNORMAL LOW (ref 0.7–4.0)
MCHC: 33.7 g/dL (ref 30.0–36.0)
MCV: 92.9 fL (ref 78.0–100.0)
Monocytes Absolute: 0.7 10*3/uL (ref 0.1–1.0)
Monocytes Relative: 12.2 % — ABNORMAL HIGH (ref 3.0–12.0)
Neutro Abs: 4.2 10*3/uL (ref 1.4–7.7)
Neutrophils Relative %: 73.5 % (ref 43.0–77.0)
Platelets: 177 10*3/uL (ref 150.0–400.0)
Potassium: 4.3 meq/L (ref 3.5–5.1)
Prothrombin Time: 10.8 s (ref 9.7–11.8)
RBC: 4 M/uL — ABNORMAL LOW (ref 4.22–5.81)
RDW: 19.1 % — ABNORMAL HIGH (ref 11.5–14.6)
Sodium: 140 meq/L (ref 135–145)
WBC: 5.7 10*3/uL (ref 4.5–10.5)
aPTT: 29.5 s — ABNORMAL HIGH (ref 21.7–28.8)

## 2010-07-19 ENCOUNTER — Telehealth (INDEPENDENT_AMBULATORY_CARE_PROVIDER_SITE_OTHER): Payer: Self-pay | Admitting: *Deleted

## 2010-07-19 ENCOUNTER — Encounter (INDEPENDENT_AMBULATORY_CARE_PROVIDER_SITE_OTHER): Payer: Self-pay | Admitting: *Deleted

## 2010-07-25 ENCOUNTER — Telehealth: Payer: Self-pay | Admitting: Internal Medicine

## 2010-07-30 ENCOUNTER — Encounter: Payer: Self-pay | Admitting: Internal Medicine

## 2010-07-30 ENCOUNTER — Ambulatory Visit: Payer: Self-pay | Admitting: Internal Medicine

## 2010-07-30 ENCOUNTER — Encounter: Payer: Self-pay | Admitting: Sports Medicine

## 2010-08-06 ENCOUNTER — Ambulatory Visit: Payer: Self-pay | Admitting: Internal Medicine

## 2010-08-26 ENCOUNTER — Ambulatory Visit: Payer: Self-pay | Admitting: Internal Medicine

## 2010-08-26 ENCOUNTER — Ambulatory Visit (HOSPITAL_COMMUNITY): Admission: RE | Admit: 2010-08-26 | Discharge: 2010-08-26 | Payer: Self-pay | Admitting: Internal Medicine

## 2010-09-02 ENCOUNTER — Ambulatory Visit: Payer: Self-pay | Admitting: Internal Medicine

## 2010-09-02 LAB — CONVERTED CEMR LAB
BUN: 50 mg/dL — ABNORMAL HIGH (ref 6–23)
CO2: 23 meq/L (ref 19–32)
Calcium: 9.8 mg/dL (ref 8.4–10.5)
Chloride: 107 meq/L (ref 96–112)
Creatinine, Ser: 1.5 mg/dL (ref 0.4–1.5)
GFR calc non Af Amer: 64.02 mL/min (ref >60)
Glucose, Bld: 138 mg/dL — ABNORMAL HIGH (ref 70–99)
Potassium: 4.9 meq/L (ref 3.5–5.1)
Pro B Natriuretic peptide (BNP): 466 pg/mL — ABNORMAL HIGH (ref 0.0–100.0)
Sodium: 139 meq/L (ref 135–145)

## 2010-09-06 LAB — CONVERTED CEMR LAB

## 2010-09-13 IMAGING — CR DG CHEST 2V
2 series · 2 of 2 positions shown · non-contrast
Comparison: 06/05/2005

CLINICAL DATA: Shortness of breath and fever.

CHEST - 2 VIEW

[view not recorded (1 of 2)]
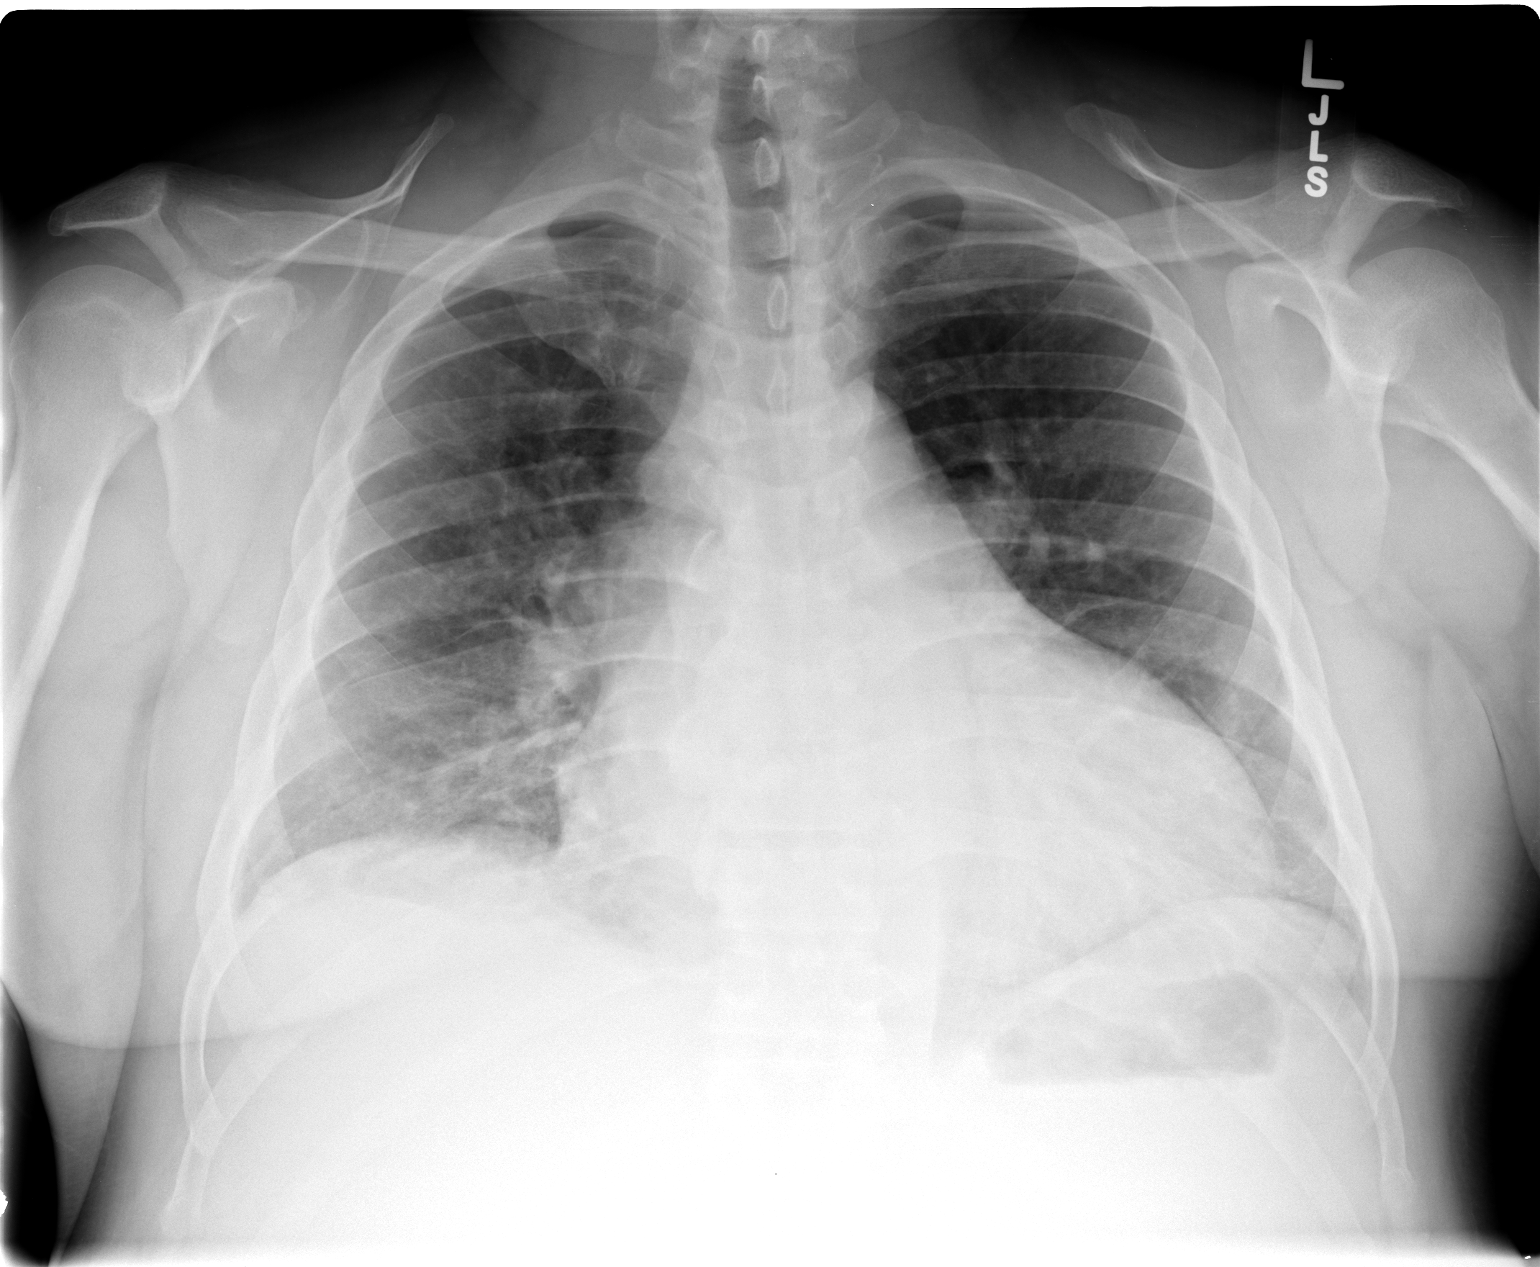

[view not recorded (2 of 2)]
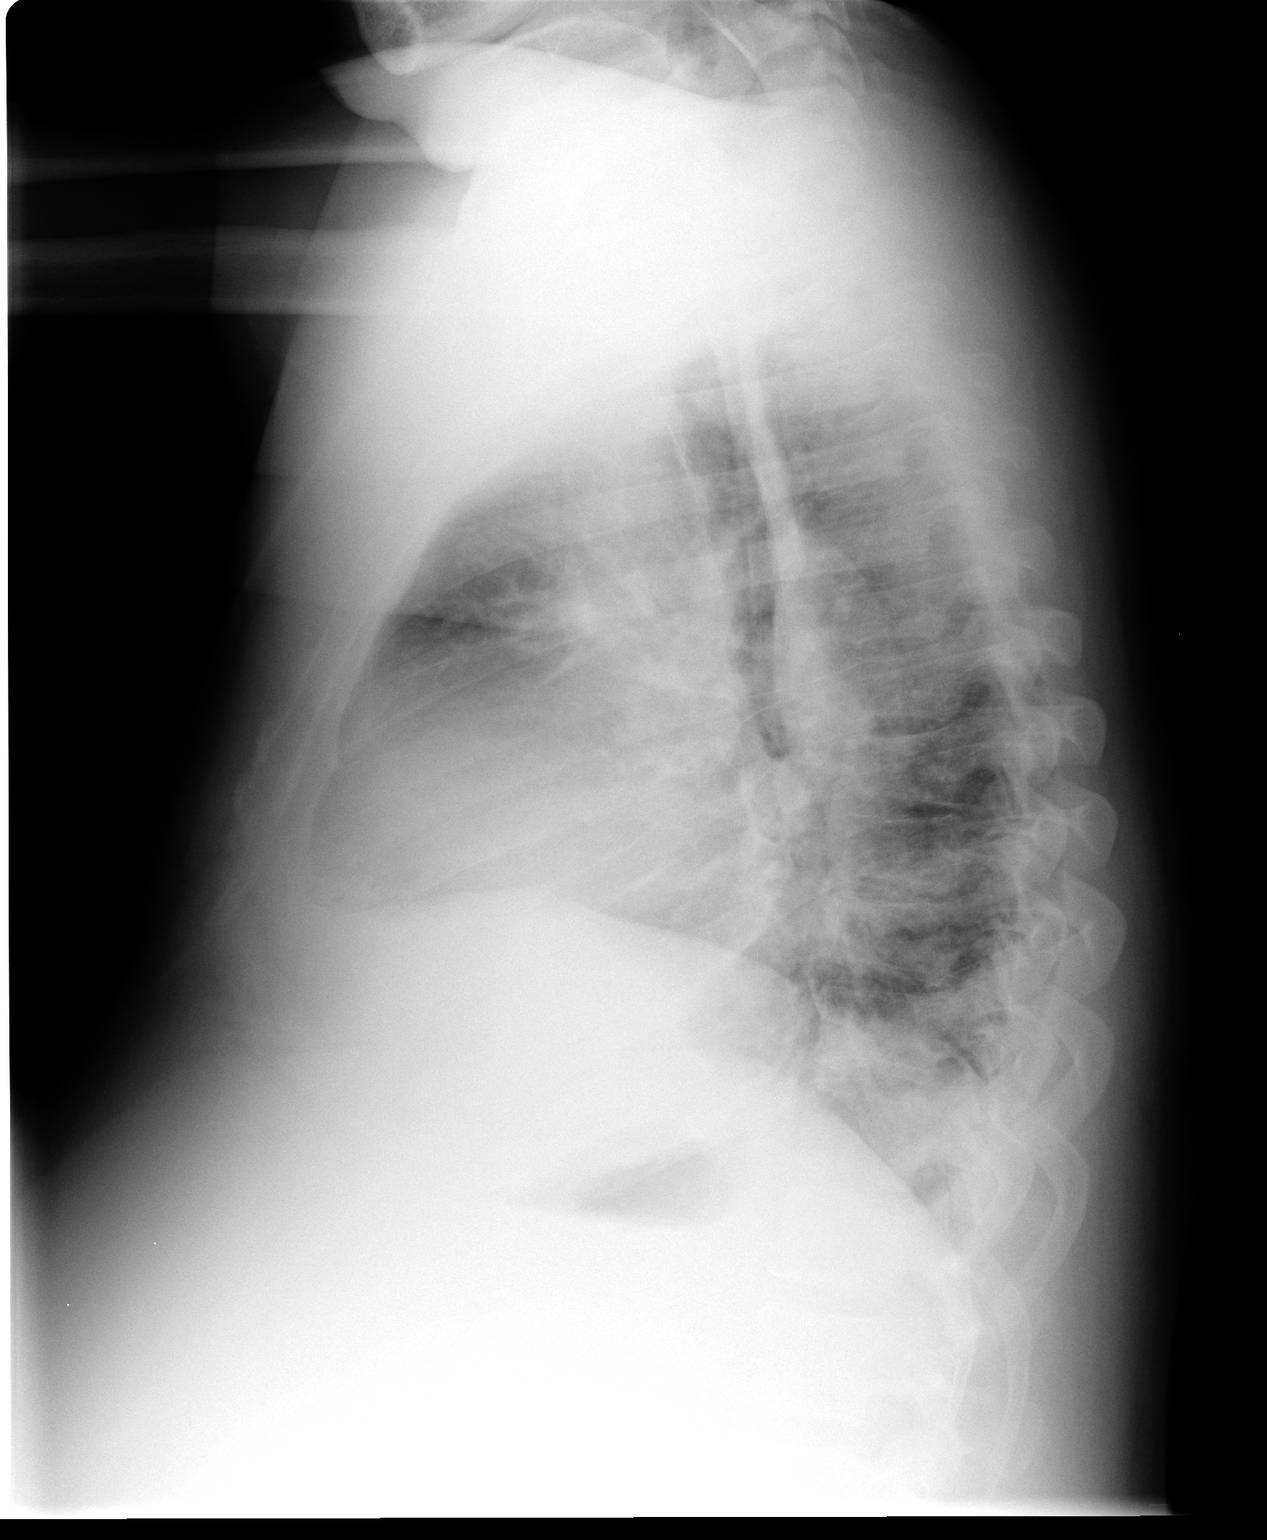

[2 of 2 positions shown; findings below may reference images not displayed]

FINDINGS: Cardiomegaly is identified.
Right lower lobe airspace disease is identified worrisome for
pneumonia.
There is no evidence of pleural effusion, pneumothorax, or
pulmonary edema.
No acute bony abnormalities are identified.
IMPRESSION: Right lower lobe airspace disease worrisome for pneumonia.  Follow
up to resolution recommended.

Cardiomegaly.

## 2010-09-17 ENCOUNTER — Emergency Department (HOSPITAL_COMMUNITY): Admission: EM | Admit: 2010-09-17 | Discharge: 2010-09-17 | Payer: Self-pay | Admitting: Family Medicine

## 2010-09-27 ENCOUNTER — Ambulatory Visit: Payer: Self-pay | Admitting: Internal Medicine

## 2010-09-29 IMAGING — CR DG CHEST 1V PORT
1 series · 1 of 1 positions shown · non-contrast
Comparison: Portable exam 9422 hours compared to 10/09/2009

CLINICAL DATA: CHF, history hypertension, diabetes

PORTABLE CHEST - 1 VIEW

[view not recorded]
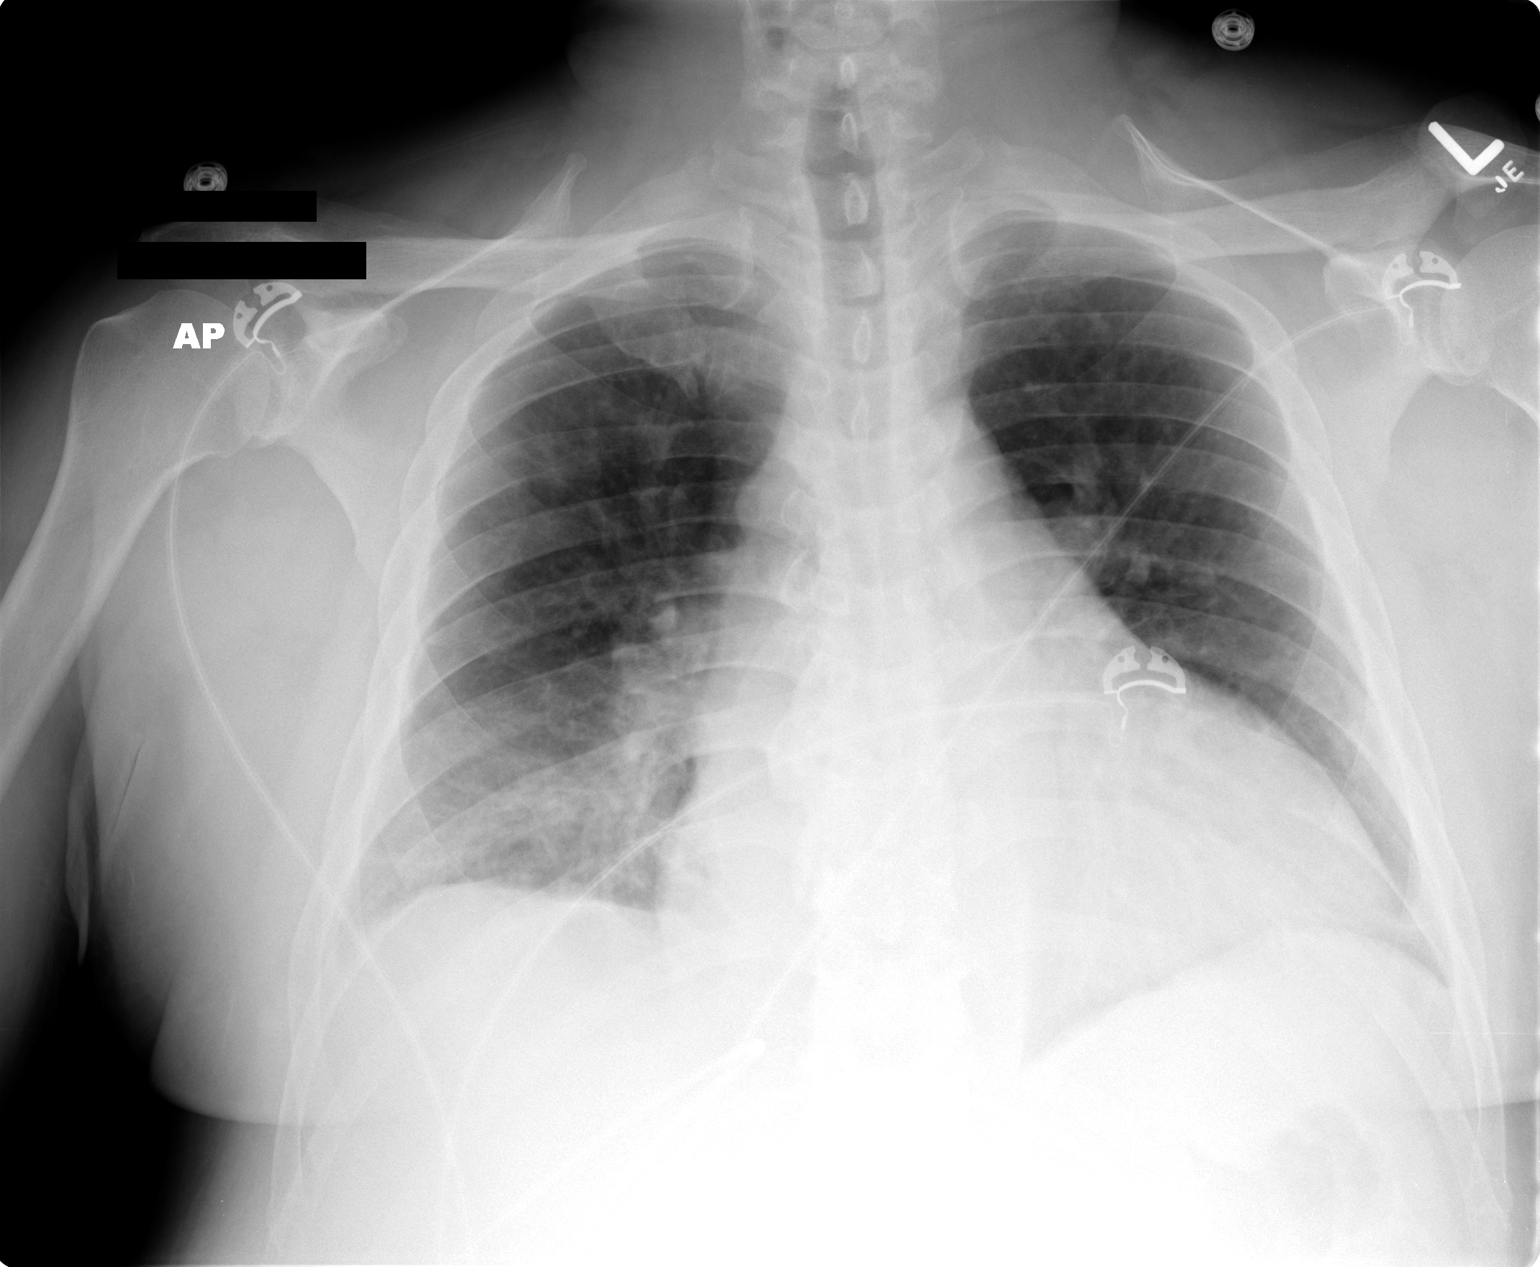

[1 of 1 positions shown; findings below may reference images not displayed]

FINDINGS: Cardiac enlargement.
Mild pulmonary vascular congestion.
Mediastinal contours normal.
Right basilar infiltrate, little changed from previous study.
Remaining lungs clear.
No overt pulmonary edema noted.
Bones unremarkable.
IMPRESSION: Cardiomegaly.
Persistent right basilar infiltrate question pneumonia.

## 2010-09-30 IMAGING — CR DG CHEST 1V PORT
1 series · 1 of 1 positions shown · non-contrast
Comparison: Portable chest x-ray of 10/25/2009

CLINICAL DATA: Central line placement

PORTABLE CHEST - 1 VIEW

[view not recorded]
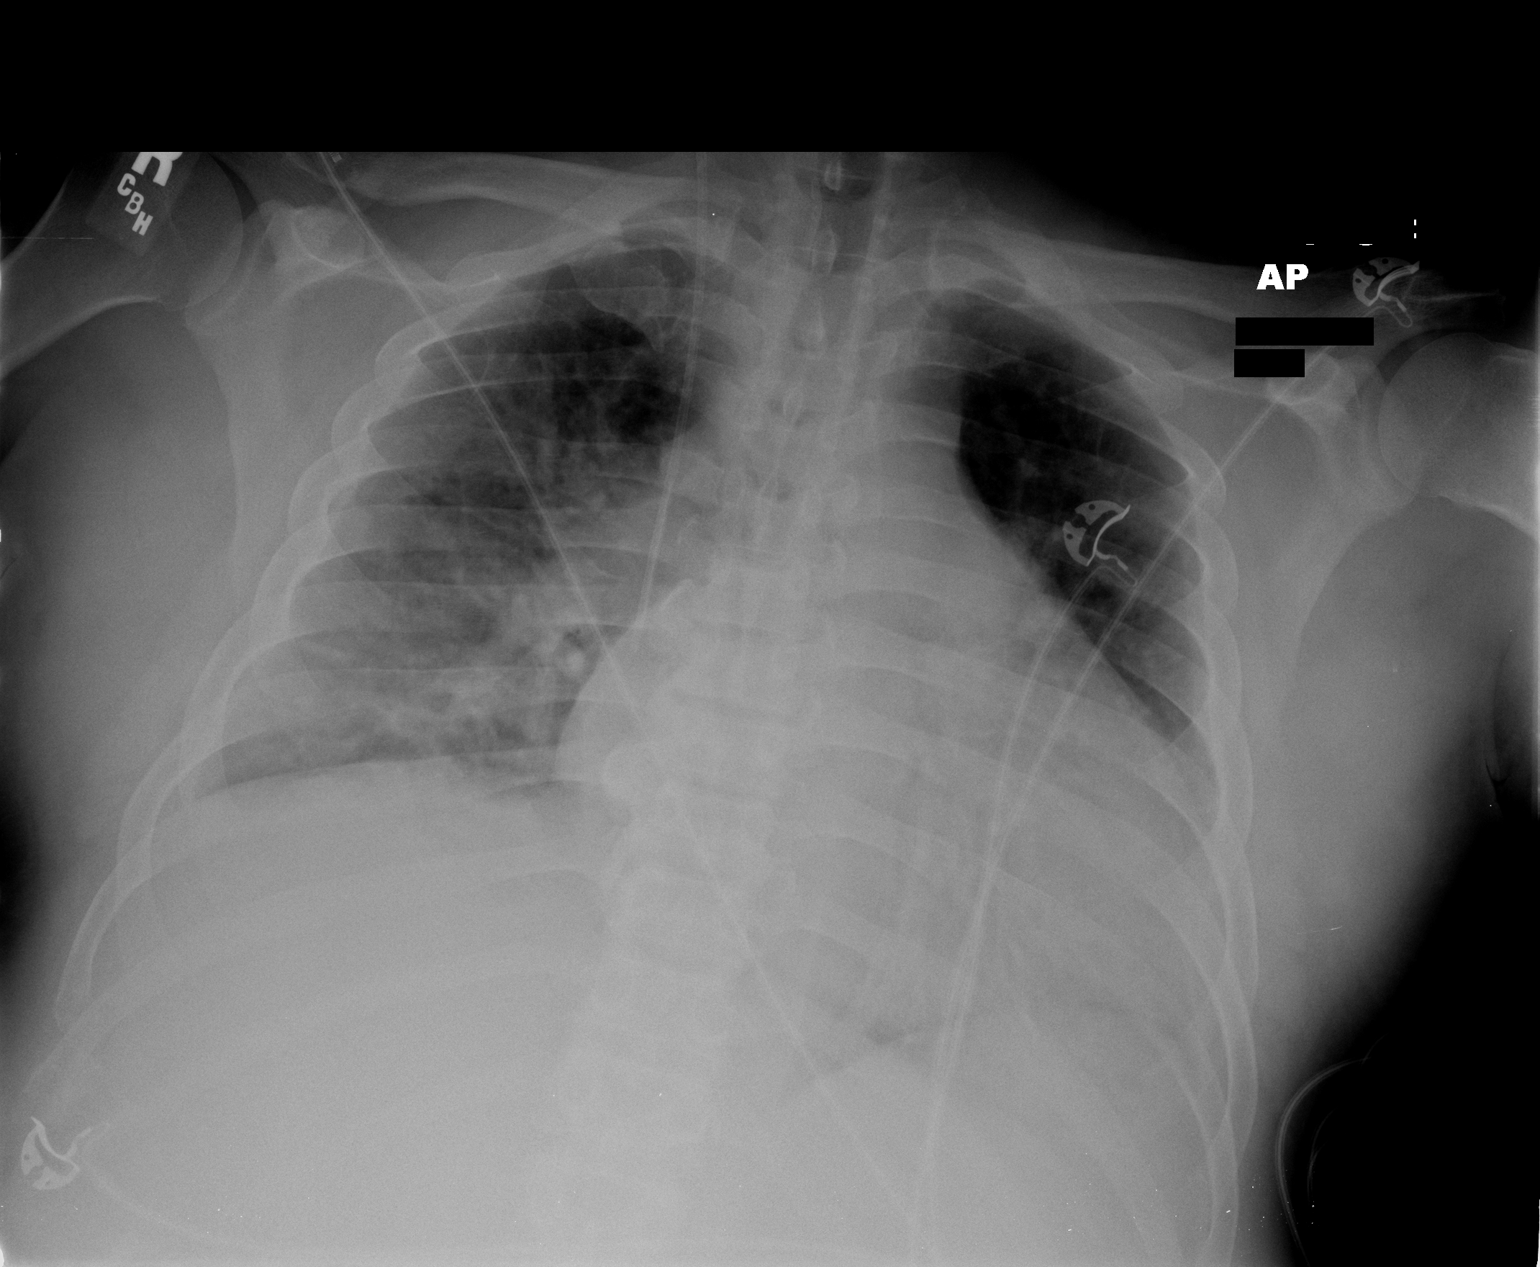

[1 of 1 positions shown; findings below may reference images not displayed]

FINDINGS: A right IJ central venous catheter is present with the
tip in the lower SVC near the expected right atrial junction.  No
pneumothorax is seen.  There is new opacity in the right midlung
and right lung base.  Cardiomegaly is stable.  There is vague
opacity medially at the left lung base.
IMPRESSION: 1.  Right IJ central venous catheter tip in lower SVC just above
expected right atrial junction.  No pneumothorax.
 2.  New parenchymal opacity in the right mid and lower lung field
and medially at the left lung base.  Cannot exclude pneumonia.

## 2010-10-01 IMAGING — CR DG CHEST 1V PORT
1 series · 1 of 1 positions shown · non-contrast
Comparison: 10/26/2009

CLINICAL DATA: Heart failure and pneumonia.

PORTABLE CHEST - 1 VIEW

[view not recorded]
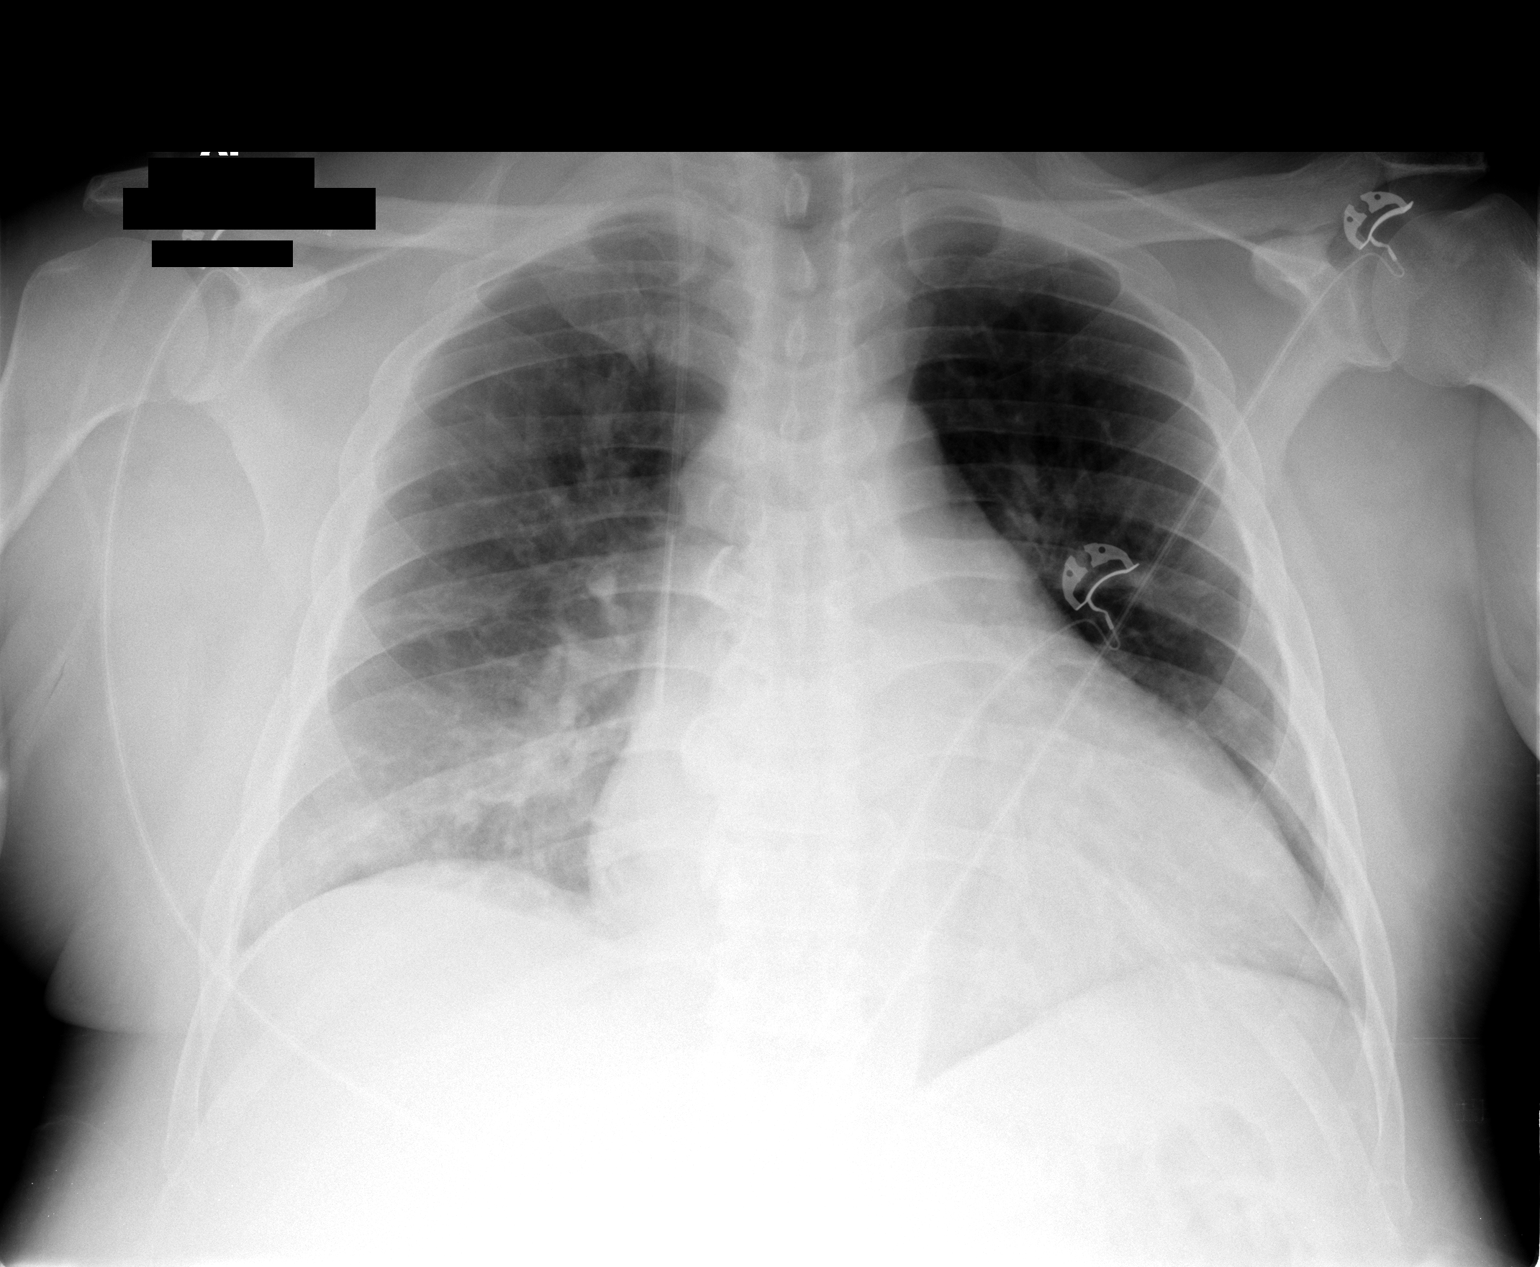

[1 of 1 positions shown; findings below may reference images not displayed]

FINDINGS: Single view of the chest demonstrates patchy opacities in
the right lower lung.  Right jugular central line in the distal SVC
region.  The left lung appears clear.  Cardiac silhouette remains
enlarged.  The trachea is midline.
IMPRESSION: Patchy densities in the right lung base.  Differential diagnosis
includes infection versus atelectasis.

Cardiomegaly.

## 2010-10-04 IMAGING — NM NM PULM PERFUSION & VENT (REBREATHING & WASHOUT)
2 series · 12 of 12 positions shown · non-contrast
Comparison: none

CLINICAL DATA: Short of breath.  Evaluate for PE.

Exam: NM pulmonary perfusion and ventilation scan
Radiopharmaceutical:  10 mCi xenon-688 and 6 mCi technetium MAA IV

[vq scan · 2.52mm/px · 6 of 17 frames shown (1 of 2)]
[frame 2/17  full-range]
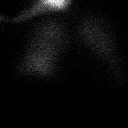
[frame 4/17  full-range]
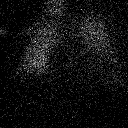
[frame 7/17]
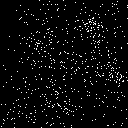
[frame 10/17]
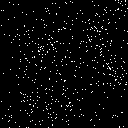
[frame 13/17]
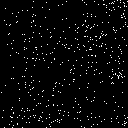
[frame 16/17]
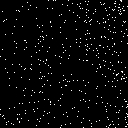

[vq scan · 2.52mm/px · 6 of 17 frames shown (2 of 2)]
[frame 2/17  full-range]
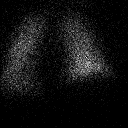
[frame 4/17]
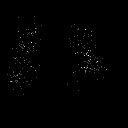
[frame 7/17]
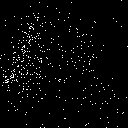
[frame 10/17]
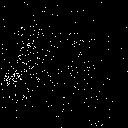
[frame 13/17]
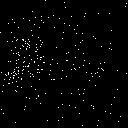
[frame 16/17]
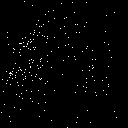

[12 of 12 positions shown; findings below may reference images not displayed]

FINDINGS: There are no segmental or lobar perfusion defects to
suggest PE.  No ventilation defects.
IMPRESSION: Very low probability for PE.

## 2010-10-04 IMAGING — CR DG CHEST 2V
1 series · 1 of 1 positions shown · non-contrast
Comparison: 10/27/2009

CLINICAL DATA: Congestive heart failure.

CHEST - 2 VIEW

[w chest lat *]
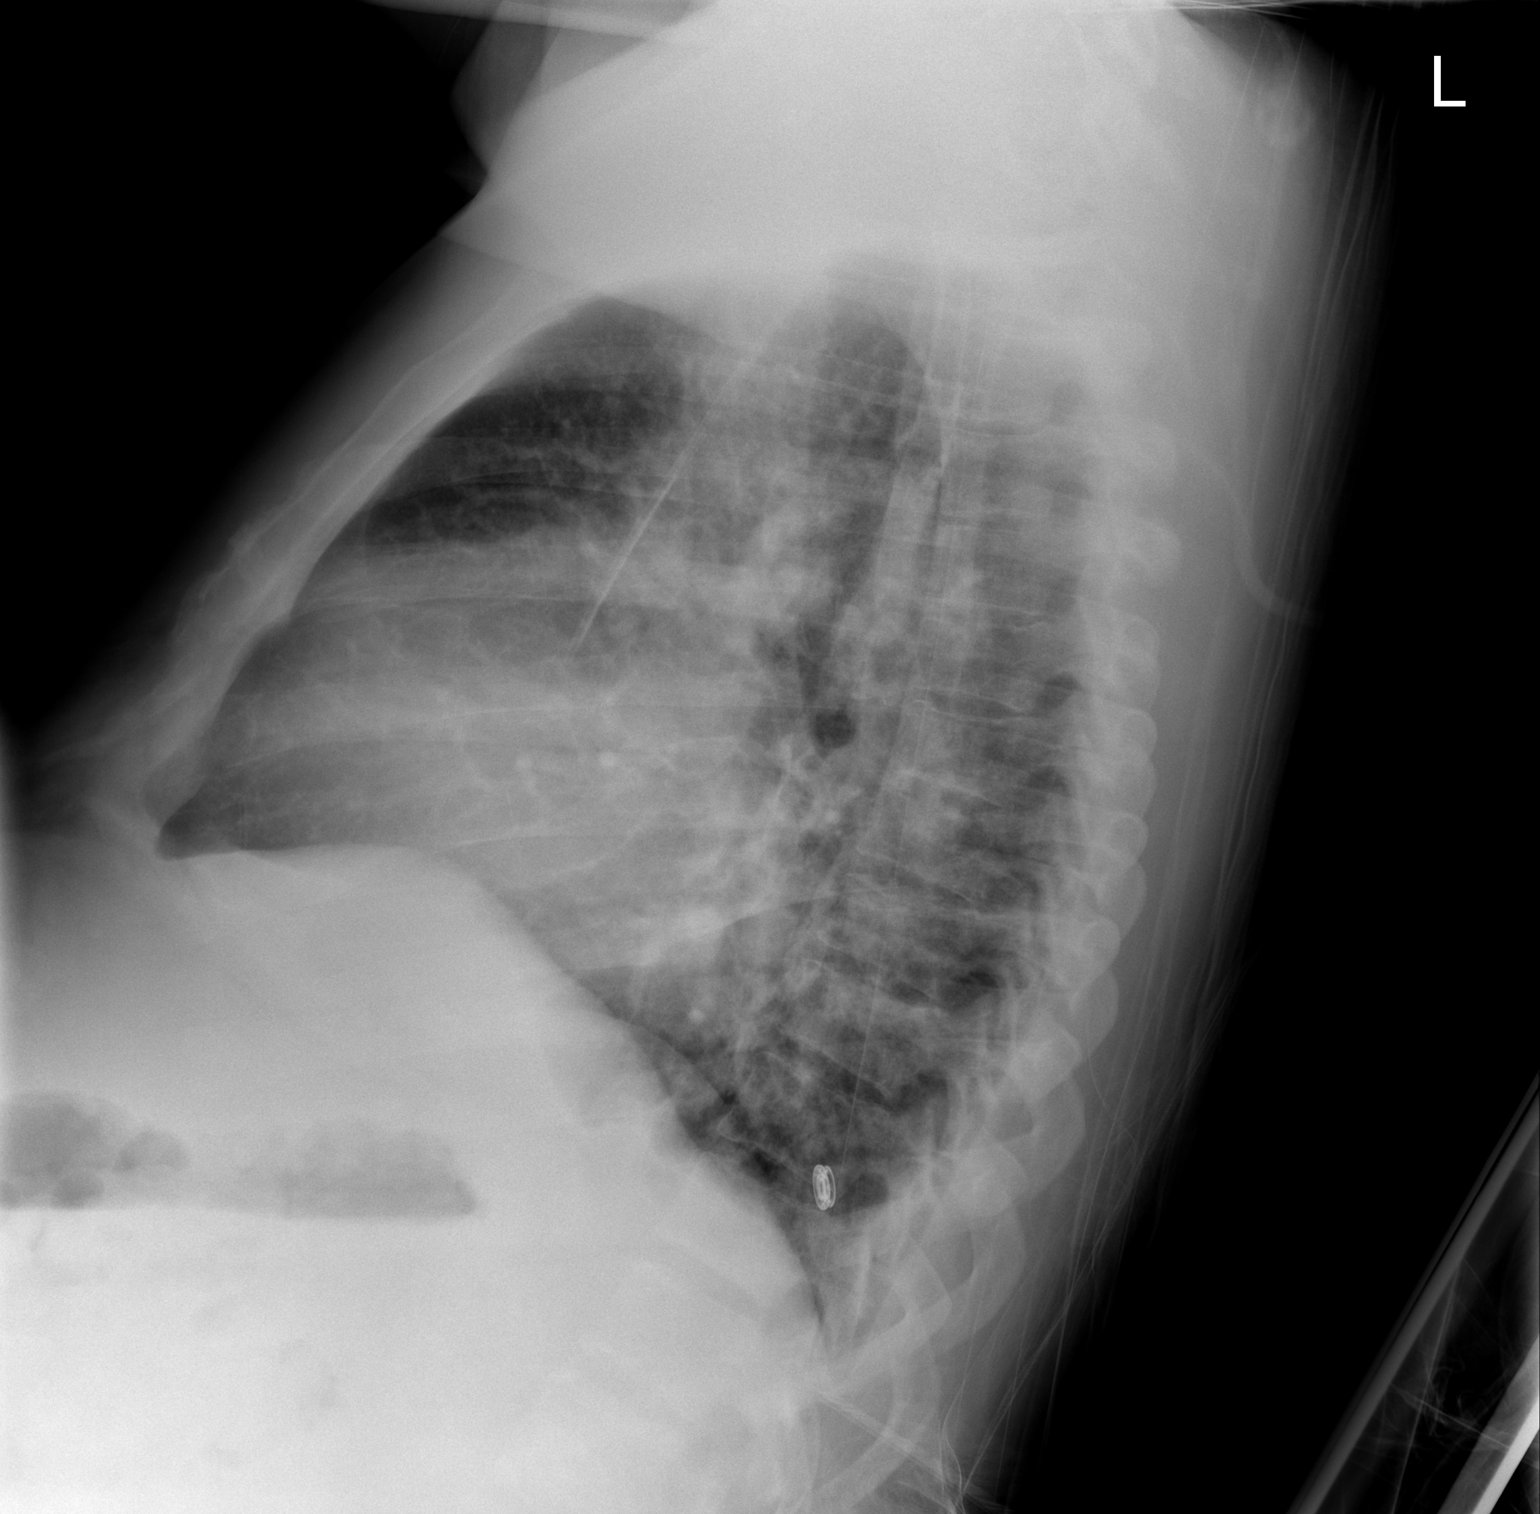

[1 of 1 positions shown; findings below may reference images not displayed]

FINDINGS: Cardiomegaly and diffuse pulmonary vascular congestion
remain stable.  Mild interstitial edema cannot definitely be
excluded.  There is no evidence of pulmonary air space disease or
pleural effusion.  Right jugular center venous catheter remains in
appropriate position.
IMPRESSION: Stable cardiomegaly and diffuse pulmonary vascular congestion.
Probable mild interstitial edema.

## 2010-10-05 IMAGING — CR DG CHEST 1V PORT
1 series · 1 of 1 positions shown · non-contrast
Comparison: 10/30/2009.

CLINICAL DATA: Follow-up congestive heart failure.

PORTABLE CHEST - 1 VIEW

[AP]
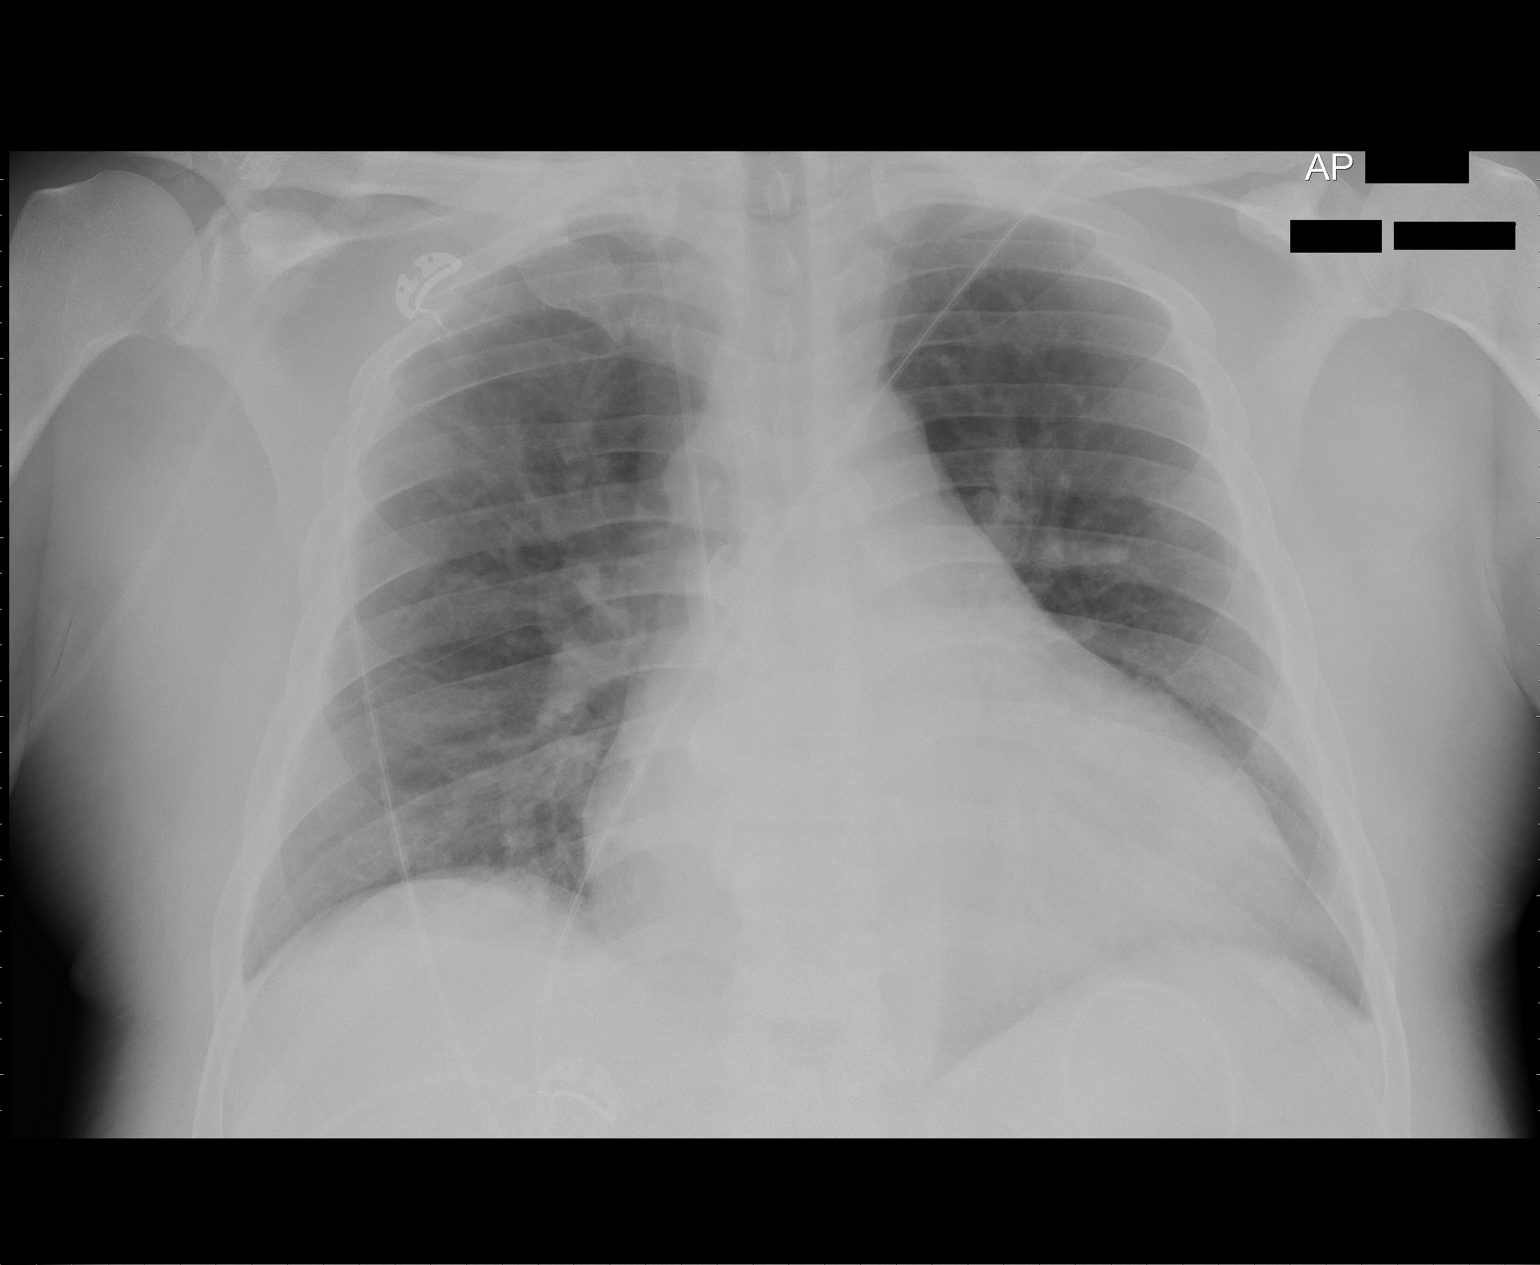

[1 of 1 positions shown; findings below may reference images not displayed]

FINDINGS: 5385 hours.  Right IJ central venous catheter is
unchanged.  There is stable cardiac enlargement.  Mild vascular
congestion remains.  There is no evidence of edema, confluent
airspace opacity or significant pleural effusion.
IMPRESSION: Cardiomegaly with mild vascular congestion.  No overt pulmonary
edema.

## 2010-10-05 IMAGING — CT CT CHEST W/O CM
3 series · 18 of 29 positions shown, 19 images · non-contrast
Comparison: None

CLINICAL DATA: Productive cough.

CT CHEST WITHOUT CONTRAST
TECHNIQUE: Multidetector CT imaging of the chest was performed
following the standard protocol without IV contrast.

[Series 2: chest w/o · axial · non-contrast · 0.72mm/px · z∈[-230,-90]mm · 4 of 57 slices shown, 5 images]
[im 15/57  mediastinal]
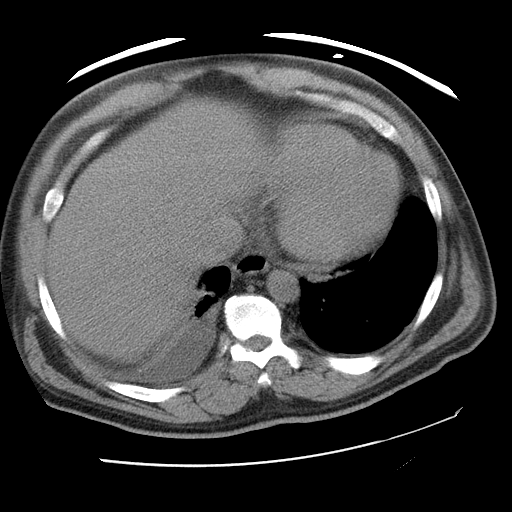
[im 15/57  lung]
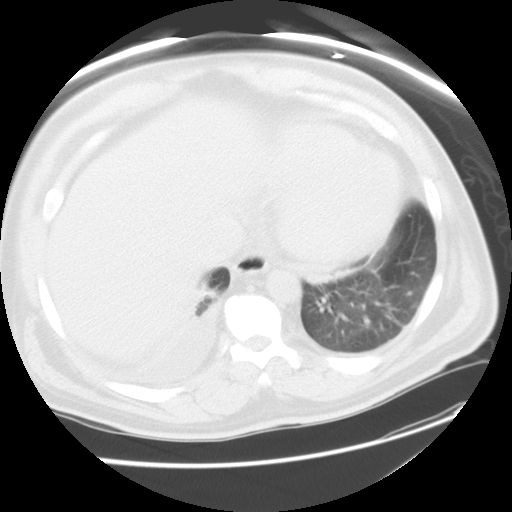
[im 29/57  lung]
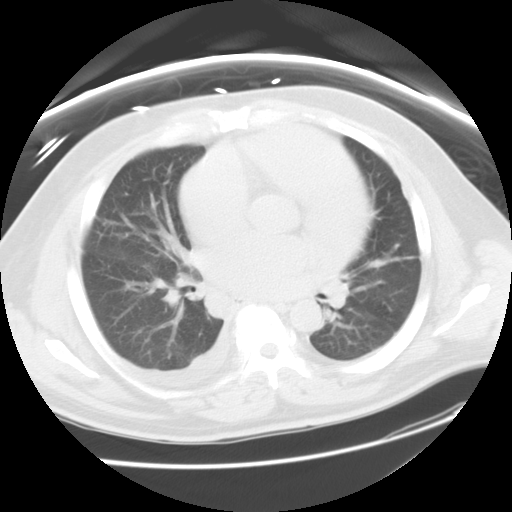
[im 33/57  lung]
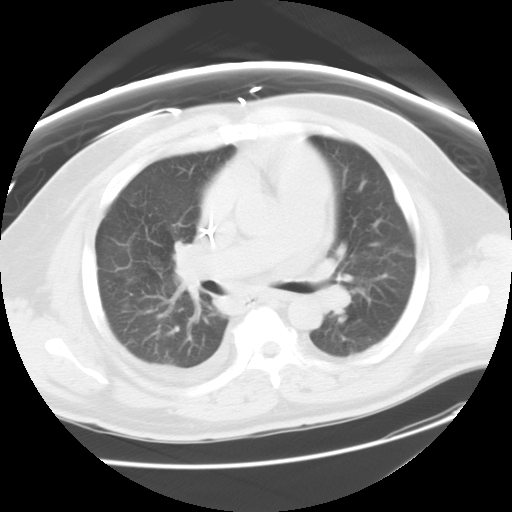
[im 43/57  lung]
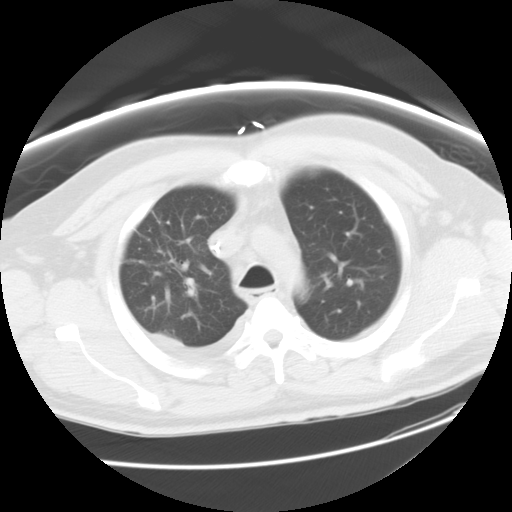

[Series 400: coronals · coronal · 0.70mm/px · 6 of 103 slices shown]
[im 12/103  lung]
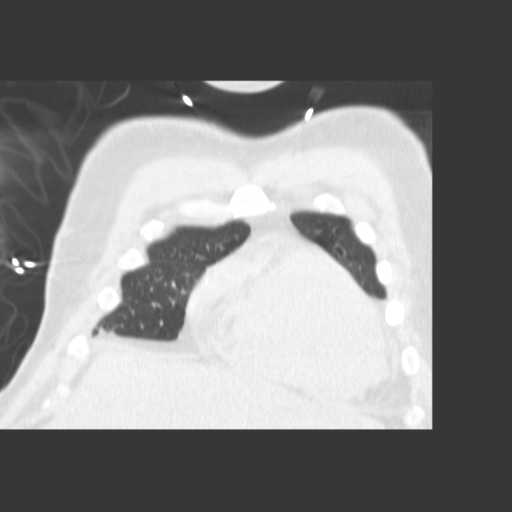
[im 23/103  lung]
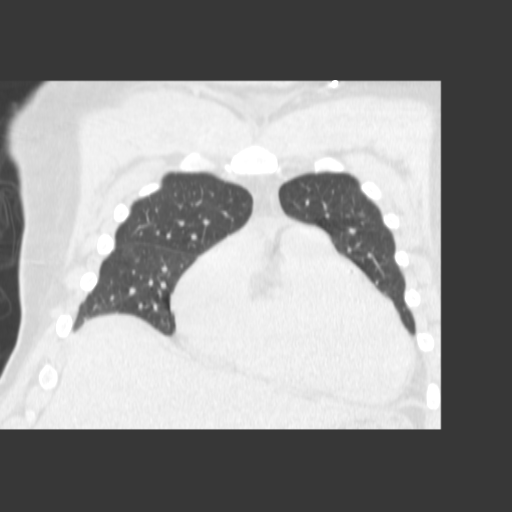
[im 35/103  lung]
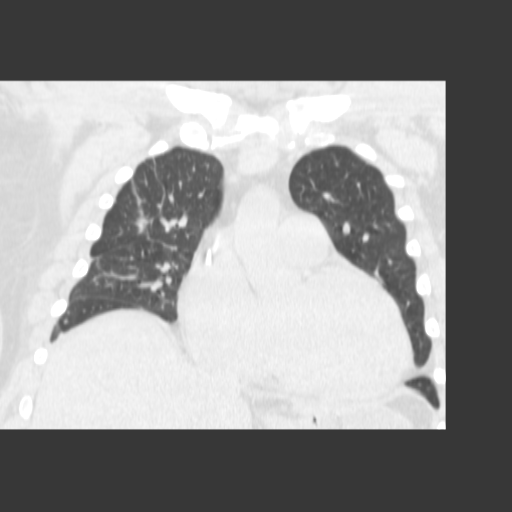
[im 46/103  lung]
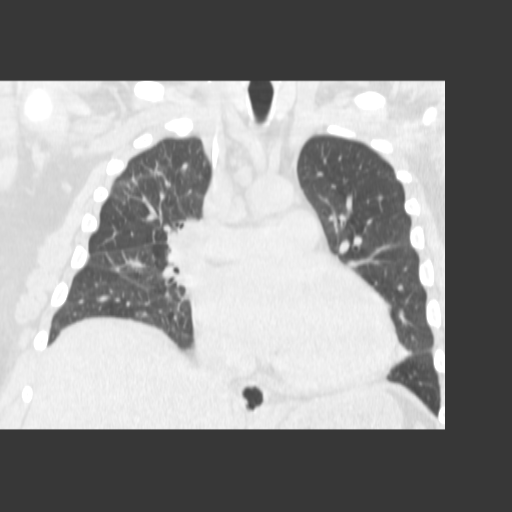
[im 57/103  lung]
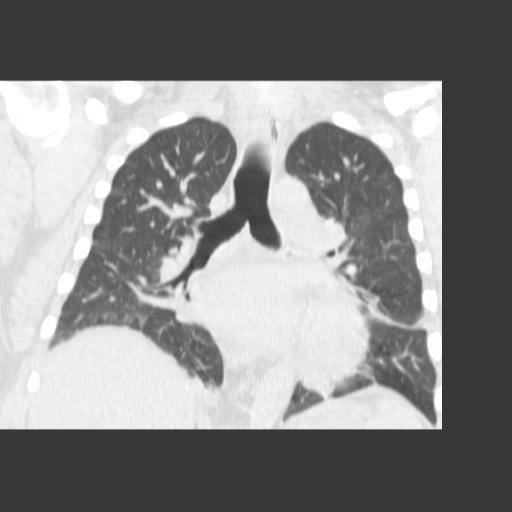
[im 69/103  lung]
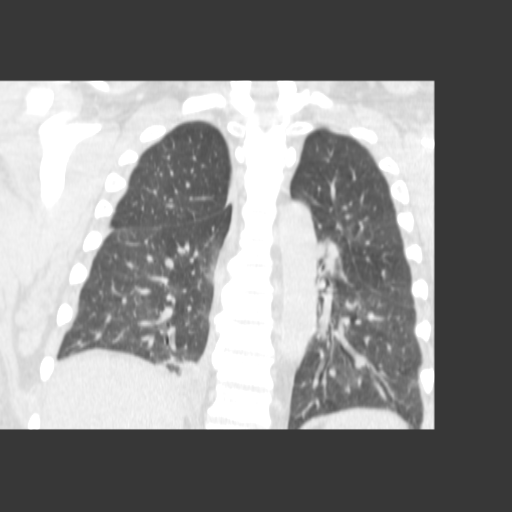

[Series 401: sagittals · sagittal · 0.70mm/px · 8 of 138 slices shown]
[im 12/138  lung]
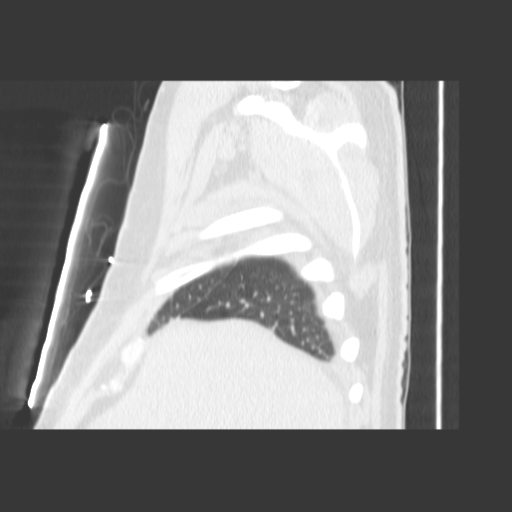
[im 35/138  lung]
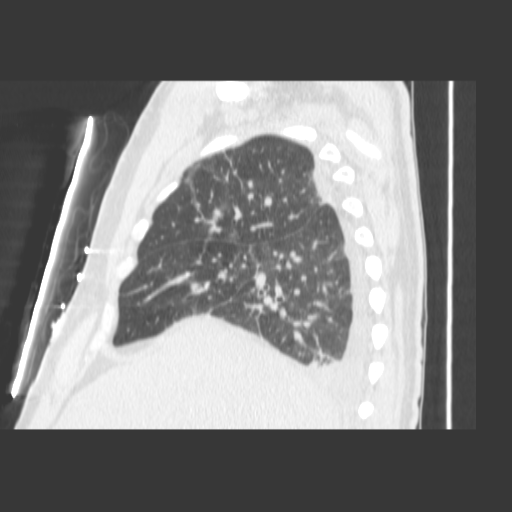
[im 46/138  lung]
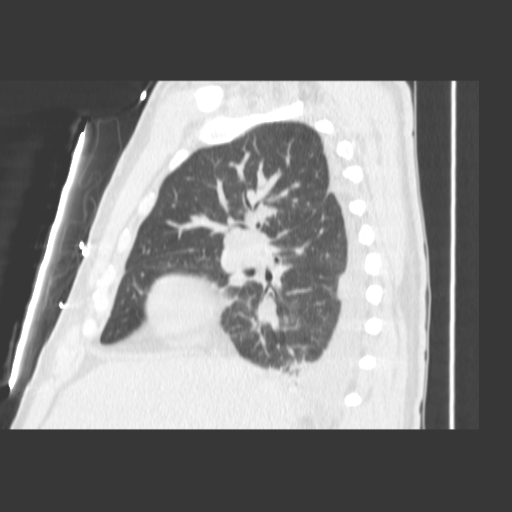
[im 58/138  lung]
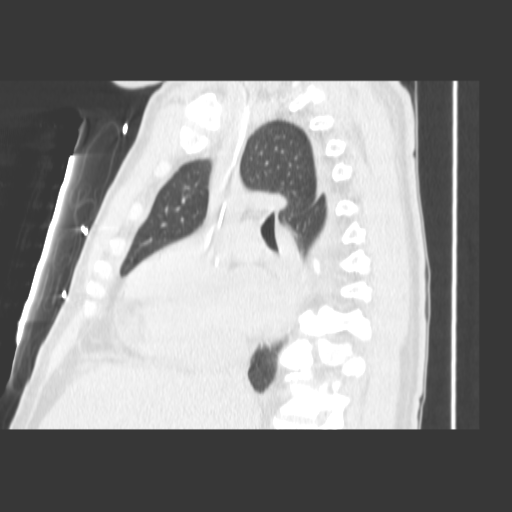
[im 80/138  lung]
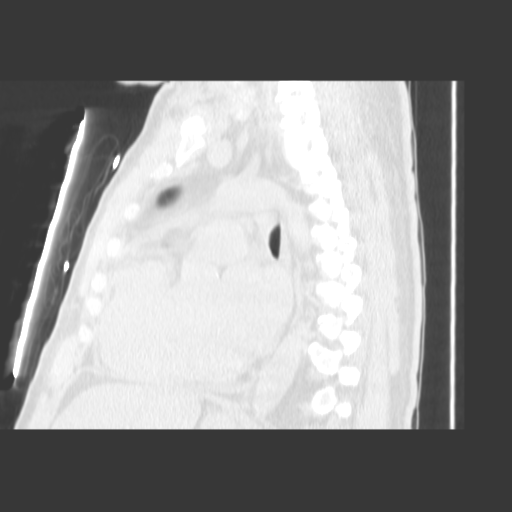
[im 92/138  lung]
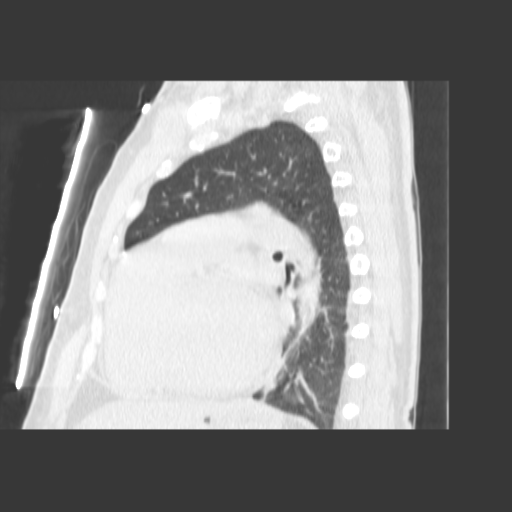
[im 103/138  lung]
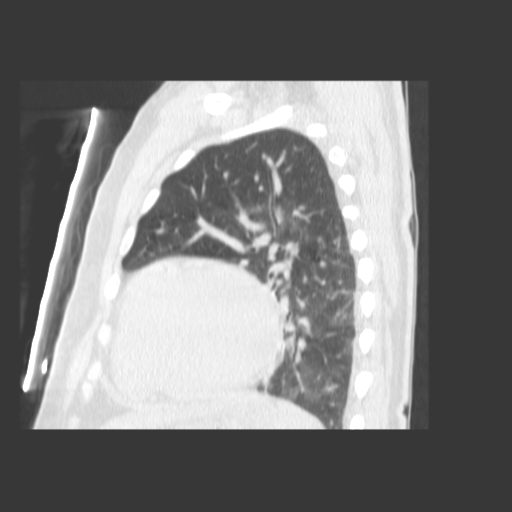
[im 126/138  lung]
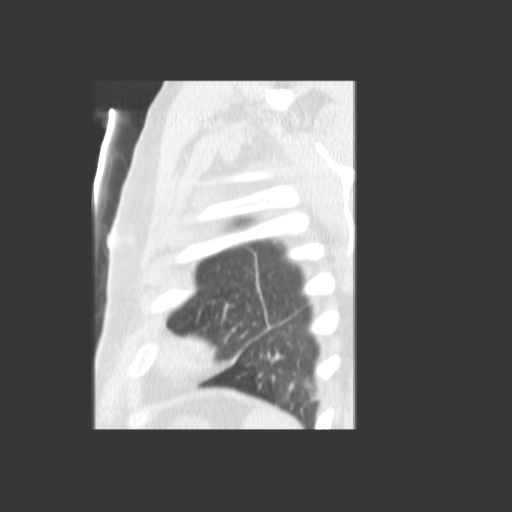

[18 of 29 positions shown; findings below may reference images not displayed]

FINDINGS: The chest wall demonstrates a right IJ central venous
catheter in good position without complicating features.  There are
small bilateral supraclavicular and axillary lymph nodes.  The bony
thorax is intact.  Degenerative changes noted in the mid and lower
thoracic spine.

The heart is enlarged.  No pericardial effusion.  There are
borderline enlarged mediastinal and hilar lymph nodes.

Examination of the lung parenchyma demonstrates mild vascular
congestion and patchy ground-glass opacity which could reflect mild
edema.  There is a linear band of atelectasis or scarring in the
right upper lobe.  There is a small right effusion with overlying
atelectasis.  Minimal streaky left basilar atelectasis.  No focal
airspace consolidation to suggest pneumonia.

The upper abdomen is grossly normal.
IMPRESSION: 1.  Cardiac enlargement.
2.  Borderline enlarged mediastinal and hilar lymph nodes.  There
are also a small supraclavicular and axillary lymph nodes.  A
follow-up CT scan with IV contrast if possible and 3 months is
suggested to reevaluate these nodes.
3.  Vascular congestion and minimal patchy pulmonary edema or mild
alveolitis.  No focal airspace consolidation to suggest pneumonia.
4.  Streaky areas of atelectasis and scarring.

## 2010-10-10 ENCOUNTER — Encounter (INDEPENDENT_AMBULATORY_CARE_PROVIDER_SITE_OTHER): Payer: Self-pay | Admitting: *Deleted

## 2010-10-16 ENCOUNTER — Ambulatory Visit: Payer: Self-pay | Admitting: Internal Medicine

## 2010-10-16 ENCOUNTER — Encounter: Payer: Self-pay | Admitting: Internal Medicine

## 2010-10-18 LAB — CONVERTED CEMR LAB
BUN: 30 mg/dL — ABNORMAL HIGH (ref 6–23)
CO2: 25 meq/L (ref 19–32)
Calcium: 9.6 mg/dL (ref 8.4–10.5)
Chloride: 102 meq/L (ref 96–112)
Creatinine, Ser: 1.63 mg/dL — ABNORMAL HIGH (ref 0.40–1.50)
Glucose, Bld: 181 mg/dL — ABNORMAL HIGH (ref 70–99)
Potassium: 4.5 meq/L (ref 3.5–5.3)
Pro B Natriuretic peptide (BNP): 531.8 pg/mL — ABNORMAL HIGH (ref 0.0–100.0)
Sodium: 141 meq/L (ref 135–145)

## 2010-10-26 IMAGING — CR DG CHEST 2V
2 series · 2 of 2 positions shown · non-contrast
Comparison: 10/31/2009

CLINICAL DATA: CHF, defibrillator insertion

CHEST - 2 VIEW

[w chest pa]
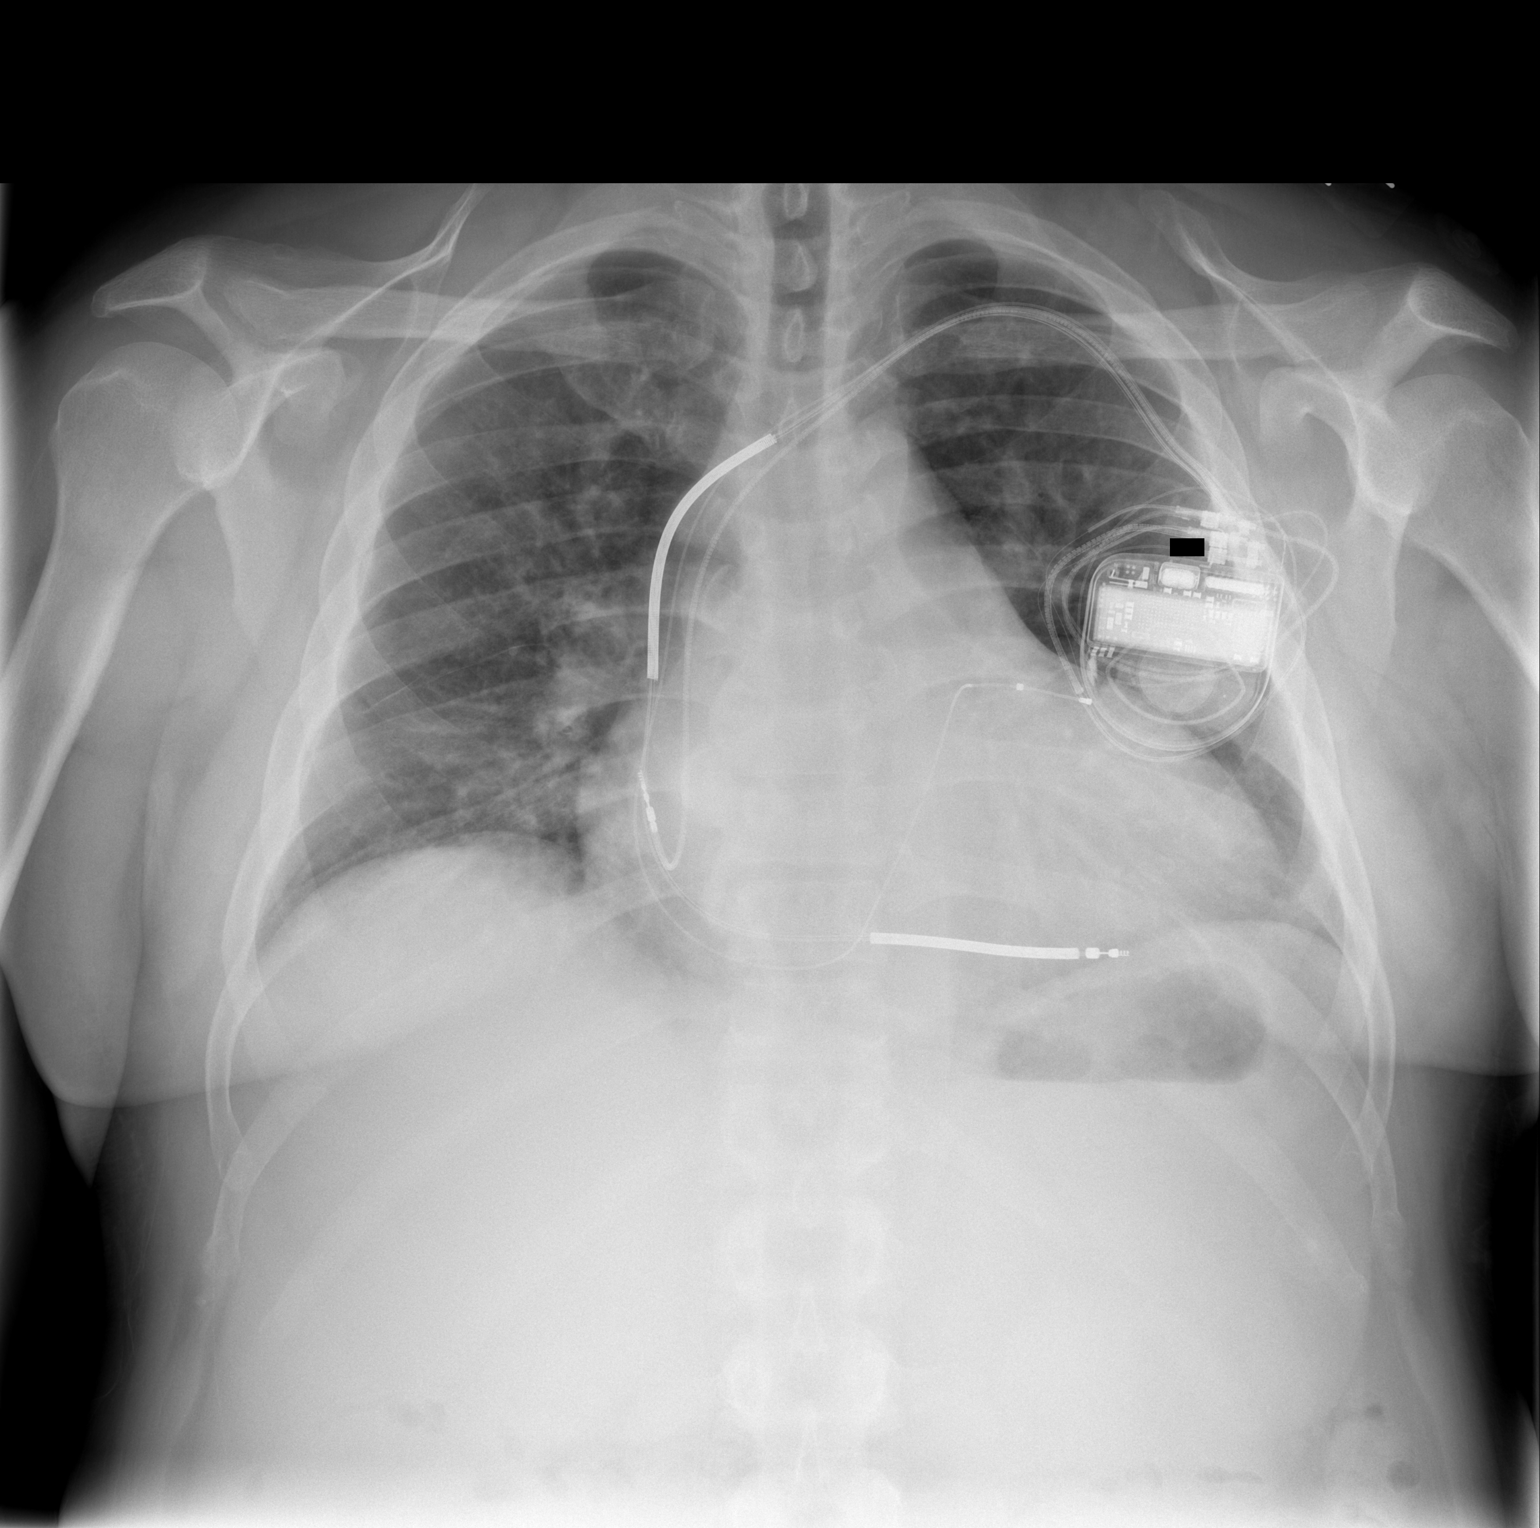

[w chest lat]
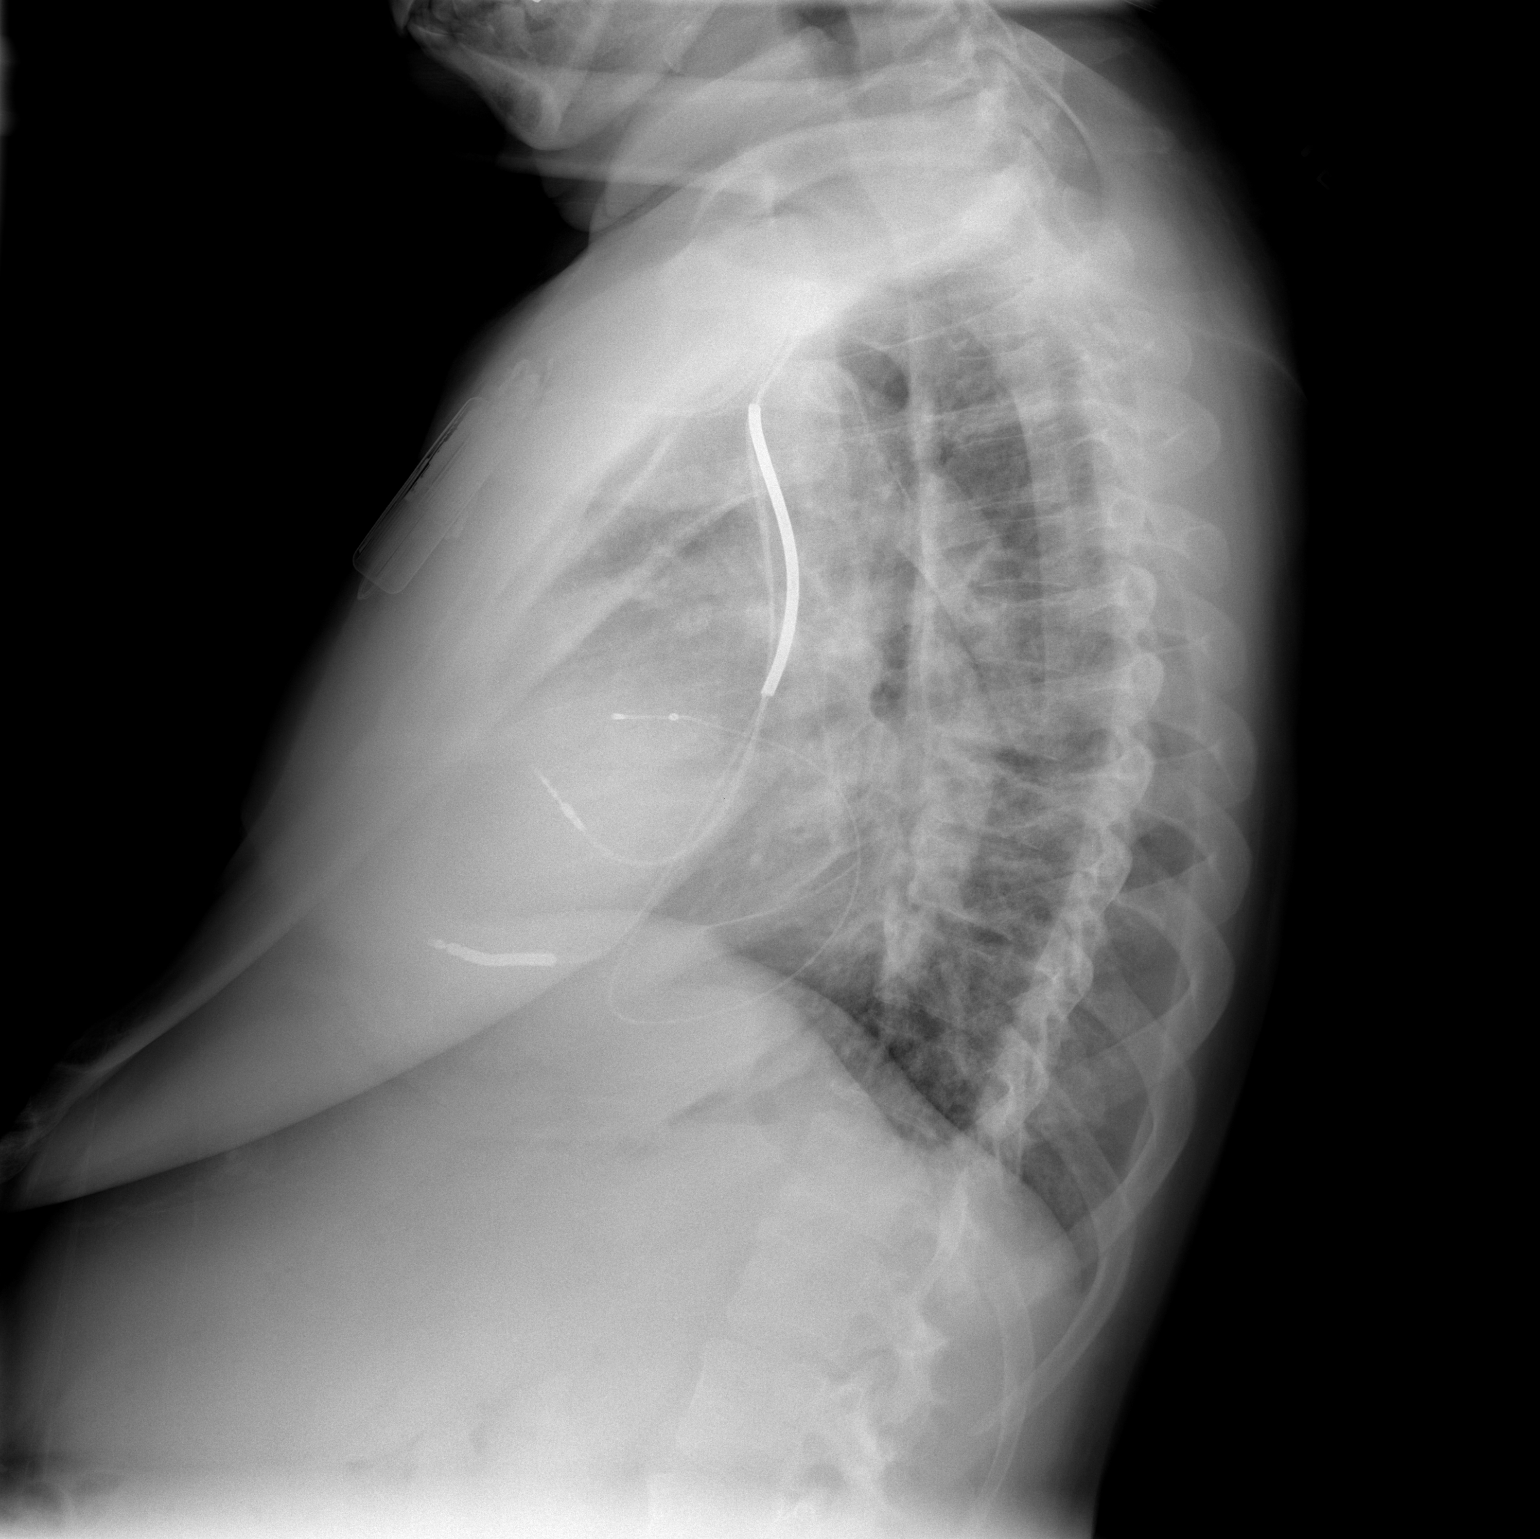

[2 of 2 positions shown; findings below may reference images not displayed]

FINDINGS: Left subclavian defibrillator/pacemaker insertion noted.
Heart is enlarged with central vascular congestion.  Low lung
volumes with mild basilar atelectasis.  No effusion or
pneumothorax.  Midline trachea.  Degenerative thoracic spine.
IMPRESSION: Status post left subclavian defibrillator/pacemaker.
Cardiomegaly, vascular congestion and basilar atelectasis.
No pneumothorax

## 2010-10-28 ENCOUNTER — Ambulatory Visit: Payer: Self-pay | Admitting: Internal Medicine

## 2010-11-20 ENCOUNTER — Ambulatory Visit: Payer: Self-pay

## 2010-11-20 ENCOUNTER — Ambulatory Visit (HOSPITAL_COMMUNITY): Admission: RE | Admit: 2010-11-20 | Payer: Self-pay | Source: Home / Self Care | Admitting: Internal Medicine

## 2010-11-20 ENCOUNTER — Ambulatory Visit: Payer: Self-pay | Admitting: Internal Medicine

## 2010-11-22 ENCOUNTER — Telehealth (INDEPENDENT_AMBULATORY_CARE_PROVIDER_SITE_OTHER): Payer: Self-pay | Admitting: *Deleted

## 2010-11-26 ENCOUNTER — Ambulatory Visit: Admit: 2010-11-26 | Payer: Self-pay | Admitting: Internal Medicine

## 2010-11-28 ENCOUNTER — Telehealth: Payer: Self-pay | Admitting: Internal Medicine

## 2010-12-02 ENCOUNTER — Encounter: Payer: Self-pay | Admitting: Internal Medicine

## 2010-12-05 ENCOUNTER — Encounter: Payer: Self-pay | Admitting: Internal Medicine

## 2010-12-05 ENCOUNTER — Other Ambulatory Visit: Payer: Self-pay | Admitting: Internal Medicine

## 2010-12-05 LAB — CBC WITH DIFFERENTIAL/PLATELET
Basophils Absolute: 0 10*3/uL (ref 0.0–0.1)
Basophils Relative: 0.2 % (ref 0.0–3.0)
Eosinophils Absolute: 0.1 10*3/uL (ref 0.0–0.7)
Eosinophils Relative: 2.3 % (ref 0.0–5.0)
HCT: 40 % (ref 39.0–52.0)
Hemoglobin: 13.1 g/dL (ref 13.0–17.0)
Lymphocytes Relative: 9.7 % — ABNORMAL LOW (ref 12.0–46.0)
Lymphs Abs: 0.5 10*3/uL — ABNORMAL LOW (ref 0.7–4.0)
MCHC: 32.8 g/dL (ref 30.0–36.0)
MCV: 92.2 fl (ref 78.0–100.0)
Monocytes Absolute: 0.6 10*3/uL (ref 0.1–1.0)
Monocytes Relative: 12.9 % — ABNORMAL HIGH (ref 3.0–12.0)
Neutro Abs: 3.5 10*3/uL (ref 1.4–7.7)
Neutrophils Relative %: 74.9 % (ref 43.0–77.0)
Platelets: 119 10*3/uL — ABNORMAL LOW (ref 150.0–400.0)
RBC: 4.34 Mil/uL (ref 4.22–5.81)
RDW: 19 % — ABNORMAL HIGH (ref 11.5–14.6)
WBC: 4.7 10*3/uL (ref 4.5–10.5)

## 2010-12-05 LAB — BASIC METABOLIC PANEL
BUN: 50 mg/dL — ABNORMAL HIGH (ref 6–23)
CO2: 26 mEq/L (ref 19–32)
Calcium: 9.8 mg/dL (ref 8.4–10.5)
Chloride: 99 mEq/L (ref 96–112)
Creatinine, Ser: 2 mg/dL — ABNORMAL HIGH (ref 0.4–1.5)
GFR: 47.97 mL/min — ABNORMAL LOW (ref >60.00)
Glucose, Bld: 166 mg/dL — ABNORMAL HIGH (ref 70–99)
Potassium: 4.4 mEq/L (ref 3.5–5.1)
Sodium: 136 mEq/L (ref 135–145)

## 2010-12-05 LAB — PROTIME-INR
INR: 1.2 ratio — ABNORMAL HIGH (ref 0.8–1.0)
Prothrombin Time: 13.1 s — ABNORMAL HIGH (ref 10.2–12.4)

## 2010-12-09 ENCOUNTER — Ambulatory Visit
Admission: RE | Admit: 2010-12-09 | Discharge: 2010-12-09 | Payer: Self-pay | Source: Home / Self Care | Attending: Internal Medicine | Admitting: Internal Medicine

## 2010-12-09 ENCOUNTER — Other Ambulatory Visit: Payer: Self-pay

## 2010-12-09 LAB — BASIC METABOLIC PANEL
BUN: 57 mg/dL — ABNORMAL HIGH (ref 6–23)
CO2: 27 mEq/L (ref 19–32)
Calcium: 9.6 mg/dL (ref 8.4–10.5)
Chloride: 95 mEq/L — ABNORMAL LOW (ref 96–112)
Creatinine, Ser: 2.3 mg/dL — ABNORMAL HIGH (ref 0.4–1.5)
GFR: 40.05 mL/min — ABNORMAL LOW (ref >60.00)
Glucose, Bld: 173 mg/dL — ABNORMAL HIGH (ref 70–99)
Potassium: 4.7 mEq/L (ref 3.5–5.1)
Sodium: 134 mEq/L — ABNORMAL LOW (ref 135–145)

## 2010-12-11 ENCOUNTER — Ambulatory Visit
Admission: RE | Admit: 2010-12-11 | Discharge: 2010-12-11 | Payer: Self-pay | Source: Home / Self Care | Attending: Internal Medicine | Admitting: Internal Medicine

## 2010-12-11 ENCOUNTER — Inpatient Hospital Stay (HOSPITAL_COMMUNITY)
Admission: AD | Admit: 2010-12-11 | Discharge: 2010-12-17 | Payer: Self-pay | Source: Home / Self Care | Attending: Internal Medicine | Admitting: Internal Medicine

## 2010-12-11 ENCOUNTER — Telehealth: Payer: Self-pay | Admitting: Internal Medicine

## 2010-12-11 ENCOUNTER — Encounter: Payer: Self-pay | Admitting: Internal Medicine

## 2010-12-15 NOTE — Procedures (Addendum)
  NAMELORAN, WAGENBLAST               ACCOUNT NO.:  [id number]  MEDICAL RECORD NO.:  [id number]          PATIENT TYPE:  INP  LOCATION:  2920                         FACILITY:  [facility]  PHYSICIAN:  Shaune Pascal. Bensimhon, MDDATE OF BIRTH:  October 09, 1966  DATE OF PROCEDURE:  12/11/2010 DATE OF DISCHARGE:                           CARDIAC CATHETERIZATION   INDICATIONS:  Tone is a 45 year old male with a congestive heart failure secondary to severe nonischemic cardiomyopathy.  From a functional standpoint, he has been doing reasonably well.  However, recently had a CPX test, which showed poor functional capacity and a markedly elevated slope.  His peak VO2 was only 12.6.  Slope was 51.  He is thus referred for right heart catheterization.  Of note, his renal function has been tenuous and fluctuating significantly with a slight upward trend.  PROCEDURES PERFORMED:  Right heart catheterization.  DESCRIPTION OF PROCEDURE:  The risks and indication were explained. Consent was signed and placed on the chart.  A 7-French venous sheath was placed in the right femoral vein and standard Swan-Ganz catheter was used.  There were no apparent complications.  Right atrial pressure mean of 26, RV pressure 57/16 with an EDP of 29, PA pressure 61/28 with a mean of 42.  Pulmonary capillary wedge mean of 23 with a V-wave to 27. Fick cardiac output 2.7 with an index of 1.4.  Pulmonary vascular resistance was 6.9 Woods units.  Femoral artery saturation was 91% and pulmonary artery saturation was 40% and 45%.  ASSESSMENT: 1. Severe biventricular heart failure with low output physiology. 2. Significant pulmonary hypertension with elevated pulmonary vascular     resistance.  PLAN/DISCUSSION:  He is clearly reaching the point where we need to consider advanced therapies.  We will admit him for milrinone initiation.  We will also start Cialis.  We will repeat his cardiac catheterization in several days.  If we  are able to get his PVR down under 4 Woods units, I would think he would be a candidate for advanced therapies.     Shaune Pascal. Bensimhon, MD     DRB/MEDQ  D:  12/11/2010  T:  12/12/2010  Job:  [id number]  Electronically Signed by Glori Bickers MD on 12/15/2010 07:16:35 PM

## 2010-12-16 LAB — POCT I-STAT 3, VENOUS BLOOD GAS (G3P V)
Acid-Base Excess: 1 mmol/L (ref 0.0–2.0)
Acid-Base Excess: 2 mmol/L (ref 0.0–2.0)
Bicarbonate: 26 mEq/L — ABNORMAL HIGH (ref 20.0–24.0)
Bicarbonate: 27.5 mEq/L — ABNORMAL HIGH (ref 20.0–24.0)
O2 Saturation: 40 %
O2 Saturation: 45 %
TCO2: 27 mmol/L (ref 0–100)
TCO2: 29 mmol/L (ref 0–100)
pCO2, Ven: 43.8 mmHg — ABNORMAL LOW (ref 45.0–50.0)
pCO2, Ven: 44.2 mmHg — ABNORMAL LOW (ref 45.0–50.0)
pH, Ven: 7.382 — ABNORMAL HIGH (ref 7.250–7.300)
pH, Ven: 7.401 — ABNORMAL HIGH (ref 7.250–7.300)
pO2, Ven: 23 mmHg — CL (ref 30.0–45.0)
pO2, Ven: 25 mmHg — CL (ref 30.0–45.0)

## 2010-12-16 LAB — BASIC METABOLIC PANEL
BUN: 55 mg/dL — ABNORMAL HIGH (ref 6–23)
BUN: 58 mg/dL — ABNORMAL HIGH (ref 6–23)
CO2: 22 mEq/L (ref 19–32)
CO2: 25 mEq/L (ref 19–32)
Calcium: 9.3 mg/dL (ref 8.4–10.5)
Calcium: 9.1 mg/dL (ref 8.4–10.5)
Chloride: 98 mEq/L (ref 96–112)
Chloride: 100 mEq/L (ref 96–112)
Creatinine, Ser: 2.17 mg/dL — ABNORMAL HIGH (ref 0.4–1.5)
Creatinine, Ser: 2.95 mg/dL — ABNORMAL HIGH (ref 0.4–1.5)
GFR calc Af Amer: 40 mL/min — ABNORMAL LOW (ref >60)
GFR calc Af Amer: 28 mL/min — ABNORMAL LOW (ref >60)
GFR calc non Af Amer: 33 mL/min — ABNORMAL LOW (ref >60)
GFR calc non Af Amer: 23 mL/min — ABNORMAL LOW (ref >60)
Glucose, Bld: 153 mg/dL — ABNORMAL HIGH (ref 70–99)
Glucose, Bld: 189 mg/dL — ABNORMAL HIGH (ref 70–99)
Potassium: 4.2 mEq/L (ref 3.5–5.1)
Potassium: 4.5 mEq/L (ref 3.5–5.1)
Sodium: 135 mEq/L (ref 135–145)
Sodium: 134 mEq/L — ABNORMAL LOW (ref 135–145)

## 2010-12-16 LAB — POCT I-STAT 3, ART BLOOD GAS (G3+)
Acid-Base Excess: 3 mmol/L — ABNORMAL HIGH (ref 0.0–2.0)
Bicarbonate: 26.9 mEq/L — ABNORMAL HIGH (ref 20.0–24.0)
O2 Saturation: 91 %
TCO2: 28 mmol/L (ref 0–100)
pCO2 arterial: 39.4 mmHg (ref 35.0–45.0)
pH, Arterial: 7.442 (ref 7.350–7.450)
pO2, Arterial: 58 mmHg — ABNORMAL LOW (ref 80.0–100.0)

## 2010-12-16 LAB — CBC
HCT: 35.6 % — ABNORMAL LOW (ref 39.0–52.0)
Hemoglobin: 11.8 g/dL — ABNORMAL LOW (ref 13.0–17.0)
MCH: 28.9 pg (ref 26.0–34.0)
MCHC: 33.1 g/dL (ref 30.0–36.0)
MCV: 87.3 fL (ref 78.0–100.0)
Platelets: 131 10*3/uL — ABNORMAL LOW (ref 150–400)
RBC: 4.08 MIL/uL — ABNORMAL LOW (ref 4.22–5.81)
RDW: 16.3 % — ABNORMAL HIGH (ref 11.5–15.5)
WBC: 5.7 10*3/uL (ref 4.0–10.5)

## 2010-12-16 LAB — GLUCOSE, CAPILLARY
Glucose-Capillary: 125 mg/dL — ABNORMAL HIGH (ref 70–99)
Glucose-Capillary: 140 mg/dL — ABNORMAL HIGH (ref 70–99)
Glucose-Capillary: 213 mg/dL — ABNORMAL HIGH (ref 70–99)
Glucose-Capillary: 95 mg/dL (ref 70–99)
Glucose-Capillary: 191 mg/dL — ABNORMAL HIGH (ref 70–99)
Glucose-Capillary: 177 mg/dL — ABNORMAL HIGH (ref 70–99)
Glucose-Capillary: 173 mg/dL — ABNORMAL HIGH (ref 70–99)
Glucose-Capillary: 254 mg/dL — ABNORMAL HIGH (ref 70–99)
Glucose-Capillary: 342 mg/dL — ABNORMAL HIGH (ref 70–99)

## 2010-12-16 LAB — CARBOXYHEMOGLOBIN
Carboxyhemoglobin: 1.6 % — ABNORMAL HIGH (ref 0.5–1.5)
Methemoglobin: 0.7 % (ref 0.0–1.5)
O2 Saturation: 70.3 %
Total hemoglobin: 12.5 g/dL — ABNORMAL LOW (ref 13.5–18.0)

## 2010-12-16 LAB — GLUCOSE, POCT (MANUAL RESULT ENTRY)
Glucose, Bld: 138 mg/dL — ABNORMAL HIGH (ref 70–99)
Operator id: 141321

## 2010-12-16 LAB — MRSA PCR SCREENING: MRSA by PCR: NEGATIVE

## 2010-12-16 LAB — MAGNESIUM: Magnesium: 1.7 mg/dL (ref 1.5–2.5)

## 2010-12-17 LAB — BASIC METABOLIC PANEL
BUN: 33 mg/dL — ABNORMAL HIGH (ref 6–23)
BUN: 58 mg/dL — ABNORMAL HIGH (ref 6–23)
BUN: 55 mg/dL — ABNORMAL HIGH (ref 6–23)
CO2: 17 mEq/L — ABNORMAL LOW (ref 19–32)
CO2: 23 mEq/L (ref 19–32)
CO2: 24 mEq/L (ref 19–32)
Calcium: 6.5 mg/dL — ABNORMAL LOW (ref 8.4–10.5)
Calcium: 8.8 mg/dL (ref 8.4–10.5)
Calcium: 8.8 mg/dL (ref 8.4–10.5)
Chloride: 99 mEq/L (ref 96–112)
Chloride: 102 mEq/L (ref 96–112)
Chloride: 104 mEq/L (ref 96–112)
Creatinine, Ser: 1.91 mg/dL — ABNORMAL HIGH (ref 0.4–1.5)
Creatinine, Ser: 1.79 mg/dL — ABNORMAL HIGH (ref 0.4–1.5)
Creatinine, Ser: 1.58 mg/dL — ABNORMAL HIGH (ref 0.4–1.5)
GFR calc Af Amer: 46 mL/min — ABNORMAL LOW (ref >60)
GFR calc Af Amer: 50 mL/min — ABNORMAL LOW (ref >60)
GFR calc Af Amer: 58 mL/min — ABNORMAL LOW (ref >60)
GFR calc non Af Amer: 38 mL/min — ABNORMAL LOW (ref >60)
GFR calc non Af Amer: 41 mL/min — ABNORMAL LOW (ref >60)
GFR calc non Af Amer: 48 mL/min — ABNORMAL LOW (ref >60)
Glucose, Bld: 242 mg/dL — ABNORMAL HIGH (ref 70–99)
Glucose, Bld: 135 mg/dL — ABNORMAL HIGH (ref 70–99)
Glucose, Bld: 212 mg/dL — ABNORMAL HIGH (ref 70–99)
Potassium: 4.5 mEq/L (ref 3.5–5.1)
Potassium: 4.7 mEq/L (ref 3.5–5.1)
Potassium: 4.6 mEq/L (ref 3.5–5.1)
Sodium: 134 mEq/L — ABNORMAL LOW (ref 135–145)
Sodium: 134 mEq/L — ABNORMAL LOW (ref 135–145)
Sodium: 136 mEq/L (ref 135–145)

## 2010-12-17 LAB — GLUCOSE, CAPILLARY
Glucose-Capillary: 221 mg/dL — ABNORMAL HIGH (ref 70–99)
Glucose-Capillary: 217 mg/dL — ABNORMAL HIGH (ref 70–99)
Glucose-Capillary: 349 mg/dL — ABNORMAL HIGH (ref 70–99)
Glucose-Capillary: 290 mg/dL — ABNORMAL HIGH (ref 70–99)
Glucose-Capillary: 84 mg/dL (ref 70–99)
Glucose-Capillary: 144 mg/dL — ABNORMAL HIGH (ref 70–99)
Glucose-Capillary: 334 mg/dL — ABNORMAL HIGH (ref 70–99)
Glucose-Capillary: 415 mg/dL — ABNORMAL HIGH (ref 70–99)
Glucose-Capillary: 144 mg/dL — ABNORMAL HIGH (ref 70–99)
Glucose-Capillary: 148 mg/dL — ABNORMAL HIGH (ref 70–99)
Glucose-Capillary: 141 mg/dL — ABNORMAL HIGH (ref 70–99)
Glucose-Capillary: 259 mg/dL — ABNORMAL HIGH (ref 70–99)

## 2010-12-17 LAB — CARBOXYHEMOGLOBIN
Carboxyhemoglobin: 1.6 % — ABNORMAL HIGH (ref 0.5–1.5)
Carboxyhemoglobin: 1.3 % (ref 0.5–1.5)
Methemoglobin: 0.5 % (ref 0.0–1.5)
Methemoglobin: 0.4 % (ref 0.0–1.5)
O2 Saturation: 67.6 %
O2 Saturation: 71.8 %
Total hemoglobin: 11.6 g/dL — ABNORMAL LOW (ref 13.5–18.0)
Total hemoglobin: 11.3 g/dL — ABNORMAL LOW (ref 13.5–18.0)

## 2010-12-18 ENCOUNTER — Encounter: Payer: Self-pay | Admitting: Internal Medicine

## 2010-12-18 LAB — BASIC METABOLIC PANEL
BUN: 44 mg/dL — ABNORMAL HIGH (ref 6–23)
CO2: 22 mEq/L (ref 19–32)
Calcium: 8.7 mg/dL (ref 8.4–10.5)
Chloride: 106 mEq/L (ref 96–112)
Creatinine, Ser: 1.36 mg/dL (ref 0.4–1.5)
GFR calc Af Amer: >60 mL/min (ref >60)
GFR calc non Af Amer: 57 mL/min — ABNORMAL LOW (ref >60)
Glucose, Bld: 156 mg/dL — ABNORMAL HIGH (ref 70–99)
Potassium: 4.2 mEq/L (ref 3.5–5.1)
Sodium: 137 mEq/L (ref 135–145)

## 2010-12-18 LAB — GLUCOSE, CAPILLARY
Glucose-Capillary: 138 mg/dL — ABNORMAL HIGH (ref 70–99)
Glucose-Capillary: 184 mg/dL — ABNORMAL HIGH (ref 70–99)

## 2010-12-18 LAB — POCT I-STAT 3, VENOUS BLOOD GAS (G3P V)
Acid-base deficit: 1 mmol/L (ref 0.0–2.0)
Acid-base deficit: 2 mmol/L (ref 0.0–2.0)
Acid-base deficit: 4 mmol/L — ABNORMAL HIGH (ref 0.0–2.0)
Bicarbonate: 21.2 mEq/L (ref 20.0–24.0)
Bicarbonate: 22.3 mEq/L (ref 20.0–24.0)
Bicarbonate: 24 mEq/L (ref 20.0–24.0)
O2 Saturation: 48 %
O2 Saturation: 53 %
O2 Saturation: 54 %
TCO2: 22 mmol/L (ref 0–100)
TCO2: 23 mmol/L (ref 0–100)
TCO2: 25 mmol/L (ref 0–100)
pCO2, Ven: 36.5 mmHg — ABNORMAL LOW (ref 45.0–50.0)
pCO2, Ven: 37.6 mmHg — ABNORMAL LOW (ref 45.0–50.0)
pCO2, Ven: 38.3 mmHg — ABNORMAL LOW (ref 45.0–50.0)
pH, Ven: 7.371 — ABNORMAL HIGH (ref 7.250–7.300)
pH, Ven: 7.381 — ABNORMAL HIGH (ref 7.250–7.300)
pH, Ven: 7.404 — ABNORMAL HIGH (ref 7.250–7.300)
pO2, Ven: 27 mmHg — CL (ref 30.0–45.0)
pO2, Ven: 28 mmHg — CL (ref 30.0–45.0)
pO2, Ven: 28 mmHg — CL (ref 30.0–45.0)

## 2010-12-19 NOTE — Discharge Summary (Addendum)
[name], Gregory               ACCOUNT NO.:  [id number]  MEDICAL RECORD NO.:  [id number]          PATIENT TYPE:  INP  LOCATION:  2920                         FACILITY:  [facility]  PHYSICIAN:  Shaune Pascal. Bensimhon, MDDATE OF BIRTH:  July 13, 1966  DATE OF ADMISSION:  12/11/2010 DATE OF DISCHARGE:  12/17/2010                              DISCHARGE SUMMARY   DISCHARGE DIAGNOSES: 1. Acute-on-chronic congestive heart failure with low output     physiology requiring intravenous inotropes. 2. Pulmonary arterial hypertension. 3. Nonischemic cardiomyopathy with an ejection fraction of 15-25%.  SECONDARY DIAGNOSES: 1. Gout. 2. Diabetes.  David Gregory is a very pleasant 44 year old male with a history of congestive heart failure due to nonischemic cardiomyopathy with an EF in the 15-20% range.  He has a history of previous alcohol and tobacco use as well as noncompliance; however, over the past year, he has stopped drinking and smoking and had been very compliant with his heart failure therapies and clinic visits.  I saw him in clinic on December 05, 2010, and felt that he had worsening heart failure.  He underwent an outpatient right heart cath on December 11, 2010.  This showed severe biventricular heart failure with a low output physiology.  Right atrial pressure was 26, RV pressure 57/16 with an EDP of 29, PA pressure was 61/28 with a mean of 42, and his wedge was 23 with a V-wave of 27.  His Fick cardiac output was 2.7 with an index of 1.4, pulmonary vascular resistance was 6.9 Woods units.  His pulmonary artery saturations were 40% and 45%.  Based on the results of catheterization, he was admitted.  He was started on milrinone 0.375 mcg/kg/minute as well as Adcirca 20 mg a day. He progressed quite nicely.  His creatinine actually came down from 2.5 to 1.36 which is a new low for him.  We did a renal ultrasound which showed normal kidneys with normal renal size, no evidence of renal artery  stenosis.  While in the hospital, he also had a flare of gout which was treated with 3 days of oral prednisone.  Of note for his diabetes, we also switched him off his Janumet and placed him on Amaryl.  He underwent repeat right heart cath on December 16, 2010, which showed marked improvement in his hemodynamics and pulmonary vascular resistance.  Right atrial pressure with mean of 18, RV pressure of 51/12 with an EDP of 19, PA pressure of 39/26 with a mean of 32, pulmonary capillary wedge pressure mean of 21, pulmonary artery sats of 53% and 54%.  His Fick cardiac output was 3.9, cardiac index was 2.0, pulmonary vascular resistance was down to 2.8 Woods units.  He will be discharged home today with home health.  He will continue to receive IV milrinone through his PICC line and also Adcirca 20 mg a day. I did cut his Coreg back to 12.5 b.i.d. due to his low output state.  He has tolerated this well.  Hopefully, we can titrate on some spironolactone or an ACE inhibitor as we go now as his renal function has improved.  Over the next week, I  will contact [facility] to have him evaluated for advanced therapies such as a transplant or mechanical circulatory support.  DISCHARGE MEDICATIONS: 1. Milrinone 0.375 mcg/kg/minute. 2. Amaryl 4 mg a day which is new. 3. Adcirca 20 mg a day. 4. Coreg 12.5 b.i.d. which is a decreased dose. 5. Digoxin 0.25 a day. 6. Allopurinol 200 a day. 7. Hydralazine 50 mg t.i.d. 8. Potassium 20 a day. 9. Demadex 20 b.i.d. He will stop the Janumet.  DISCHARGE WEIGHT:  89.3 kg which is 197 pounds.  Followup after discharge will be with me next week and with the [facility] as arranged.     Shaune Pascal. Bensimhon, MD     DRB/MEDQ  D:  12/17/2010  T:  12/17/2010  Job:  [id number]  Electronically Signed by Glori Bickers MD on 12/19/2010 04:04:05 PM

## 2010-12-19 NOTE — Procedures (Addendum)
  Gregory, David               ACCOUNT NO.:  [id number]  MEDICAL RECORD NO.:  [id number]           PATIENT TYPE:  LOCATION:                                 FACILITY:  PHYSICIAN:  Shaune Pascal. Bensimhon, MDDATE OF BIRTH:  1966/07/25  DATE OF PROCEDURE:  12/16/2010 DATE OF DISCHARGE:                           CARDIAC CATHETERIZATION   THE PATIENT IDENTIFICATION:  [name] is a 45 year old male with severe congestive heart failure due to nonischemic cardiomyopathy.  He underwent a right heart cath on December 11, 2010, which showed a decompensated heart failure with a cardiac index of 1.4 and a pulmonary vascular resistance of 6.9 Woods units.  He was admitted over the weekend, started on milrinone, and Adcirca with a plan to bring him back today to reassess his hemodynamics and pulmonary pressures.  Milrinone 0.375 mcg/kg per minute.  PROCEDURE PERFORMED:  Right heart cath.  DESCRIPTION OF THE PROCEDURE:  Risks and indication were explained, consent was signed and placed on the chart.  A 7-French venous sheath was placed in the right femoral vein using a modified Seldinger technique and a standard right heart catheterization was performed.  FINDINGS:  Right atrial pressure mean of 18, RV pressure 51/12 with an EDP of 19, PA pressure of 39/26 with a mean of 32.  Pulmonary capillary wedge pressure mean of 21.  PA saturations on room air were 53% and 54%. Fick cardiac output 3.9.  Cardiac index 2.0.  Pulmonary vascular resistance was 2.8 Woods units (down from 6.9 Woods units previously).  ASSESSMENT:  Biventricular heart failure with marked improvement in hemodynamics and pulmonary vascular resistance on intravenous milrinone and Adcirca.  PLAN/DISCUSSION:  We will make arrangements for him to be discharged home on milrinone and Adcirca.  I will contact the transplant team at [facility] to discuss the options regarding transplant or mechanical circulatory support.     Shaune Pascal. Bensimhon, MD     DRB/MEDQ  D:  12/16/2010  T:  12/17/2010  Job:  [id number]  Electronically Signed by Glori Bickers MD on 12/19/2010 04:04:11 PM

## 2010-12-19 NOTE — Discharge Summary (Addendum)
  NAMEPAUBLO, ZIMMERLE               ACCOUNT NO.:  [id number]  MEDICAL RECORD NO.:  [id number]          PATIENT TYPE:  INP  LOCATION:  2920                         FACILITY:  [facility]  PHYSICIAN:  Shaune Pascal. Bensimhon, MDDATE OF BIRTH:  [birth year]  DATE OF ADMISSION:  12/11/2010 DATE OF DISCHARGE:  12/17/2010                              DISCHARGE SUMMARY   ADDENDUM  Please note, lisinopril 5 mg p.o. b.i.d. was added to the patient's medication regimen.  Due to his history of renal insufficiency, we will also ask home health to draw a BMET to ensure stability with creatinine prior to his followup appointment.  His followup appointment with Dr. Haroldine Laws is scheduled for Friday, December 27, 2010, at 9:15 a.m.  DURATION OF DISCHARGE ENCOUNTER INCLUDING PHYSICIAN AND PA TIME: Greater than 30 minutes.     Vashon Riordan, P.A.C.   ______________________________ Shaune Pascal. Bensimhon, MD    DD/MEDQ  D:  12/17/2010  T:  12/17/2010  Job:  [id number]  Electronically Signed by Glori Bickers MD on 12/19/2010 04:04:08 PM Electronically Signed by Melina Copa  on 12/25/2010 09:28:10 PM

## 2010-12-20 ENCOUNTER — Telehealth: Payer: Self-pay | Admitting: Internal Medicine

## 2010-12-20 ENCOUNTER — Encounter: Payer: Self-pay | Admitting: Internal Medicine

## 2010-12-22 LAB — CONVERTED CEMR LAB
ALT: 17 units/L (ref 0–53)
ALT: 17 units/L (ref 0–53)
AST: 24 units/L (ref 0–37)
AST: 24 units/L (ref 0–37)
Albumin: 3.6 g/dL (ref 3.5–5.2)
Albumin: 3.9 g/dL (ref 3.5–5.2)
Alkaline Phosphatase: 108 units/L (ref 39–117)
Alkaline Phosphatase: 164 units/L — ABNORMAL HIGH (ref 39–117)
Amphetamine Screen, Ur: NEGATIVE
Amphetamine Screen, Ur: NEGATIVE
BUN: 12 mg/dL (ref 6–23)
BUN: 27 mg/dL — ABNORMAL HIGH (ref 6–23)
BUN: 38 mg/dL — ABNORMAL HIGH (ref 6–23)
BUN: 53 mg/dL — ABNORMAL HIGH (ref 6–23)
BUN: 72 mg/dL — ABNORMAL HIGH (ref 6–23)
Barbiturate Quant, Ur: NEGATIVE
Barbiturate Quant, Ur: NEGATIVE
Basophils Absolute: 0 10*3/uL (ref 0.0–0.1)
Basophils Relative: 0.1 % (ref 0.0–1.0)
Benzodiazepines.: NEGATIVE
Benzodiazepines.: NEGATIVE
Bilirubin, Direct: 0.3 mg/dL (ref 0.0–0.3)
Bilirubin, Direct: 0.6 mg/dL — ABNORMAL HIGH (ref 0.0–0.3)
CO2: 23 meq/L (ref 19–32)
CO2: 26 meq/L (ref 19–32)
CO2: 26 meq/L (ref 19–32)
CO2: 28 meq/L (ref 19–32)
CO2: 29 meq/L (ref 19–32)
Calcium: 10.2 mg/dL (ref 8.4–10.5)
Calcium: 9.1 mg/dL (ref 8.4–10.5)
Calcium: 9.1 mg/dL (ref 8.4–10.5)
Calcium: 9.5 mg/dL (ref 8.4–10.5)
Calcium: 9.7 mg/dL (ref 8.4–10.5)
Chloride: 101 meq/L (ref 96–112)
Chloride: 102 meq/L (ref 96–112)
Chloride: 102 meq/L (ref 96–112)
Chloride: 105 meq/L (ref 96–112)
Chloride: 99 meq/L (ref 96–112)
Cocaine Metabolites: NEGATIVE
Cocaine Metabolites: NEGATIVE
Creatinine, Ser: 1 mg/dL (ref 0.4–1.5)
Creatinine, Ser: 1.1 mg/dL (ref 0.4–1.5)
Creatinine, Ser: 1.3 mg/dL (ref 0.4–1.5)
Creatinine, Ser: 2 mg/dL — ABNORMAL HIGH (ref 0.4–1.5)
Creatinine, Ser: 2.5 mg/dL — ABNORMAL HIGH (ref 0.4–1.5)
Creatinine,U: 62.3 mg/dL
Creatinine,U: 80.3 mg/dL
Eosinophils Absolute: 0.1 10*3/uL (ref 0.0–0.7)
Eosinophils Relative: 2 % (ref 0.0–5.0)
GFR calc Af Amer: 105 mL/min
GFR calc non Af Amer: 36.08 mL/min (ref >60)
GFR calc non Af Amer: 47.2 mL/min (ref >60)
GFR calc non Af Amer: 76.86 mL/min (ref >60)
GFR calc non Af Amer: 87 mL/min
GFR calc non Af Amer: 93.38 mL/min (ref >60)
Glucose, Bld: 106 mg/dL — ABNORMAL HIGH (ref 70–99)
Glucose, Bld: 109 mg/dL — ABNORMAL HIGH (ref 70–99)
Glucose, Bld: 143 mg/dL — ABNORMAL HIGH (ref 70–99)
Glucose, Bld: 181 mg/dL — ABNORMAL HIGH (ref 70–99)
Glucose, Bld: 293 mg/dL — ABNORMAL HIGH (ref 70–99)
HCT: 32.2 % — ABNORMAL LOW (ref 39.0–52.0)
Hemoglobin: 10 g/dL — ABNORMAL LOW (ref 13.0–17.0)
Lymphocytes Relative: 13 % (ref 12.0–46.0)
MCHC: 31.2 g/dL (ref 30.0–36.0)
MCV: 73.2 fL — ABNORMAL LOW (ref 78.0–100.0)
Marijuana Metabolite: POSITIVE — AB
Marijuana Metabolite: POSITIVE — AB
Methadone: NEGATIVE
Methadone: NEGATIVE
Monocytes Absolute: 0.5 10*3/uL (ref 0.1–1.0)
Monocytes Relative: 12.9 % — ABNORMAL HIGH (ref 3.0–12.0)
Neutro Abs: 2.9 10*3/uL (ref 1.4–7.7)
Neutrophils Relative %: 72 % (ref 43.0–77.0)
Opiate Screen, Urine: NEGATIVE
Opiate Screen, Urine: NEGATIVE
Phencyclidine (PCP): NEGATIVE
Phencyclidine (PCP): NEGATIVE
Platelets: 211 10*3/uL (ref 150–400)
Potassium: 3.6 meq/L (ref 3.5–5.1)
Potassium: 4.2 meq/L (ref 3.5–5.1)
Potassium: 4.5 meq/L (ref 3.5–5.1)
Potassium: 4.7 meq/L (ref 3.5–5.1)
Potassium: 4.8 meq/L (ref 3.5–5.1)
Pro B Natriuretic peptide (BNP): 267.5 pg/mL — ABNORMAL HIGH (ref 0.0–100.0)
Pro B Natriuretic peptide (BNP): 386.5 pg/mL — ABNORMAL HIGH (ref 0.0–100.0)
Pro B Natriuretic peptide (BNP): 576 pg/mL — ABNORMAL HIGH (ref 0.0–100.0)
Pro B Natriuretic peptide (BNP): 695 pg/mL — ABNORMAL HIGH (ref 0.0–100.0)
Propoxyphene: NEGATIVE
Propoxyphene: NEGATIVE
RBC: 4.4 M/uL (ref 4.22–5.81)
RDW: 21.2 % — ABNORMAL HIGH (ref 11.5–14.6)
Sodium: 137 meq/L (ref 135–145)
Sodium: 138 meq/L (ref 135–145)
Sodium: 139 meq/L (ref 135–145)
Sodium: 140 meq/L (ref 135–145)
Sodium: 140 meq/L (ref 135–145)
Total Bilirubin: 1.3 mg/dL — ABNORMAL HIGH (ref 0.3–1.2)
Total Bilirubin: 1.8 mg/dL — ABNORMAL HIGH (ref 0.3–1.2)
Total Protein: 6.9 g/dL (ref 6.0–8.3)
Total Protein: 7.8 g/dL (ref 6.0–8.3)
Uric Acid, Serum: 12.3 mg/dL — ABNORMAL HIGH (ref 4.0–7.8)
WBC: 4 10*3/uL — ABNORMAL LOW (ref 4.5–10.5)

## 2010-12-25 ENCOUNTER — Telehealth: Payer: Self-pay | Admitting: Internal Medicine

## 2010-12-25 ENCOUNTER — Encounter: Payer: Self-pay | Admitting: Internal Medicine

## 2010-12-25 HISTORY — PX: HEART TRANSPLANT: SHX268

## 2010-12-26 ENCOUNTER — Other Ambulatory Visit: Payer: Self-pay

## 2010-12-26 ENCOUNTER — Ambulatory Visit: Admit: 2010-12-26 | Payer: Self-pay | Admitting: Internal Medicine

## 2010-12-26 NOTE — Letter (Signed)
Summary: Disability Letter  Press photographer, [place]  [address] [place]   [place], [place] [address]   Phone: [phone]  Fax: [phone]        Mar 29, 2010 MRN: [id number]    RE:  KRISHAUN NOOR [address] [place], [place]  [address]    To Whom It May Concern:  Mr. Yahshua Hassani is a 45 year old man whom I follow in our Cardiology clinic for severe biventricular heart failure. Several months ago he was hospitalized for 2 weeks for his heart failure and required intravenous inotropic support. He is being seen on a regular basis and continues to have a very limited functional capacity.  He recently applied for disability benefits several times but has been denied despite a letter I wrote in December 2010 recommending he be considered for disability supprot. Given the severity of his heart failure, I have a difficult time understanding this decision and I have advised him that he will likely be unable to work for the forseeeble future and may even require advanced therapies such as heart transplantation or mechanical circulatory support.  I am thus writing another letter to you in support of Mr. Tham application for disability benefits. If there are any questions about his medical condition, please call me to discuss. Thank you for your consideration.   Sincerely,   Jolaine Artist, MD, [facility]  This letter has been electronically signed by your physician.

## 2010-12-26 NOTE — Miscellaneous (Signed)
Summary: n/s for echo  pt no showed for his echo on 12/28, he is sch. to see Dr Haroldine Laws on 1/12 will resch then [name], RN  December 02, 2010 4:36 PM   Clinical Lists Changes

## 2010-12-26 NOTE — Progress Notes (Signed)
Summary: rx refill  Medications Added ZAROXOLYN 2.5 MG TABS (METOLAZONE) Take one tablet by mouth twice daily.       Phone Note Refill Request Call back at Work Phone [phone] Message from:  Patient  patient needs refill of fluid pills...does not know name of medication.   [facility] [address]   Method Requested: Fax to [facility] Initial call taken by: Alexander Bergeron,  August 07, 2009 1:10 PM    New/Updated Medications: ZAROXOLYN 2.5 MG TABS (METOLAZONE) Take one tablet by mouth twice daily. Prescriptions: ZAROXOLYN 2.5 MG TABS (METOLAZONE) Take one tablet by mouth twice daily.  #60 x 5   Entered by:   Margaretmary Bayley CMA   Authorized by:   Jolaine Artist, MD, [facility]   Signed by:   Margaretmary Bayley CMA on 08/07/2009   Method used:   Electronically to        [facility] (retail)       [address]       [place], [place]  [address]       Ph: [phone]       Fax: [phone]   RxID:   [id number]

## 2010-12-26 NOTE — Letter (Signed)
Summary: [facility]   Imported By: Marilynne Drivers 08/07/2010 11:32:33  _____________________________________________________________________  External Attachment:    Type:   Image     Comment:   External Document

## 2010-12-26 NOTE — Cardiovascular Report (Signed)
Summary: Office Visit   Office Visit   Imported By: Sallee Provencal 12/19/2009 14:01:46  _____________________________________________________________________  External Attachment:    Type:   Image     Comment:   External Document

## 2010-12-26 NOTE — Progress Notes (Signed)
Summary: talk to nurse   Phone Note Call from Patient Call back at Work Phone [phone]   Caller: Patient Reason for Call: Talk to Nurse Summary of Call: retaining fluid, request to speak to nurse Initial call taken by: [name],  September 11, 2009 2:49 PM  Follow-up for Phone Call        legs are swelling, last week pt had SOB and had to sleep sitting up that has improved, but still having trouble w/edema, pt is on lasix 80mg  once daily.  Pt does have as needed metolazone which he has been taking everyday for the past few days, pt states he is doing better would like to take metolazone two times a day, explained we have not seen him in over a year we would need labs before and he needs an appt, offered appt for tom but pt stated he couldn't get off work, offered appt next week but he stated he would have to see b/c he can't afford it now, he states that he did see pcp last week and had labs, he will get them to fax results to me [name], RN  September 11, 2009 3:17 PM   agree. he needs to be seen asap before we can make recommendtions. can f/u with me or CHF clinic. Jolaine Artist, MD, [facility]  September 12, 2009 1:35 PM

## 2010-12-26 NOTE — Assessment & Plan Note (Signed)
Summary: 9:15/ok per heather/saf  Medications Added COREG 12.5 MG TABS (CARVEDILOL) 1 & 1/2  tablet two times a day      Allergies Added: NKDA  Visit Type:  Follow-up Primary Provider:  [name]  CC:  no complaints.  History of Present Illness: David Gregory is a 45 year old male with history of congestive heart failure due to nonischemic cardiomyopathy iniital EF 15-25%.  He also has a history of hypertension, diabetes, previous tobacco and alcohol use as well as medical noncompliance.   He was hospitalized in 12/10 of last year with low output state and was found to have worsening of his LV function with an EF of 15%.  He required inotrope support. Since that time he has underwent placement of a BiVICD. Weight on d/c was 184 pounds.   Over past few weeks we have been adjusting diuretics as he had some renal insufficiency. Initially demadex cut back to once a day but then fluid came back so now back to two times a day. Only taking metolazone as needed. We saw him last week and he was doing well. BNP down at 395 but Cr plateuaed at 2.0 which is up from 1.0-1.1 previously. Pending CPX test in early October.  Returns for f/u. Continues to do well. Gets SOB with moderate activity. Fluid status well controlled currently. No orthopnea or PND. Compliant with meds.  No ETOH or cigarettes since 12/10.    Current Medications (verified): 1)  Carvedilol 6.25 Mg Tabs (Carvedilol) .... Take 1 & 1/2 Tablet By Mouth Twice A Day 2)  Torsemide 20 Mg Tabs (Torsemide) .... Take One Tablet By Mouth Two Times A Day 3)  Lanoxin 0.25 Mg Tabs (Digoxin) .... Take 1 Tablet By Mouth Once A Day 4)  Janumet 50-1000 Mg Tabs (Sitagliptin-Metformin Hcl) .... Take One Tablet By Mouth Twice Daily. 5)  Glucosamine Sulfate   Powd (Glucosamine Sulfate) .... Two Times A Day 6)  Allopurinol 100 Mg Tabs (Allopurinol) .... 2 Tabs Once Daily 7)  Hydralazine Hcl 50 Mg Tabs (Hydralazine Hcl) .... Take One Tablet By Mouth Three  Times A Day 8)  Accupril 5 Mg Tabs (Quinapril Hcl) .... Take 1 Tablet By Mouth Two Times A Day 9)  Spironolactone 25 Mg Tabs (Spironolactone) .... Take One Tablet By Mouth Daily 10)  Metolazone 2.5 Mg Tabs (Metolazone) .... Take One Tablet By Mouth Daily As Needed 11)  Potassium Chloride Crys Cr 20 Meq Cr-Tabs (Potassium Chloride Crys Cr) .... Take One Tablet By Mouth Daily As Directed  Allergies (verified): No Known Drug Allergies  Vital Signs:  Patient profile:   45 year old male Height:      64 inches Weight:      185 pounds BMI:     31.87 Pulse rate:   72 / minute BP sitting:   103 / 65  (left arm) Cuff size:   regular  Vitals Entered By: Lubertha Basque, CNA (August 06, 2010 9:32 AM)  Physical Exam  General:  Alert, No acute distress. Respirations unlabored. HEENT: normal Neck: supple. JVP flat Carotids 2+ bilat; no bruits. No lymphadenopathy or thryomegaly appreciated. Cor: PMI laterally displaced.Regular rate and rhtyhm. +s3  No rubs.  2/6 TR Lungs: clear Abdomen:No distended Non tender. Good bowel sounds. Extremities: no cyanosis, clubbing, rash. no edema Neuro: alert & orientedx3, cranial nerves grossly intact. moves all 4 extremities w/o difficulty. affect pleasant     ICD Specifications Following MD:  [place] Grayer, MD     Referring MD:  Haroldine Laws  ICD Vendor:  Medtronic     ICD Model Number:  [id number]     ICD Serial Number:  [id number] ICD DOI:  11/20/2009     ICD Implanting MD:  Thompson Grayer, MD  Lead 1:    Location: RA     DOI: 11/20/2009     Model #: [id number]     Serial #: [id number]     Status: active Lead 2:    Location: RV     DOI: 11/20/2009     Model #: [id number]     Serial #: [id number]     Status: active Lead 3:    Location: LV     DOI: 11/20/2009     Model #: [id number]     Serial #: [id number]     Status: active  Indications::  CM   ICD Follow Up ICD Dependent:  No       ICD Device Measurements Configuration: LV TIP TO RV COIL  Brady Parameters Mode DDD      Lower Rate Limit:  60     Upper Rate Limit 130 PAV 130     Sensed AV Delay:  100  Tachy Zones VF:  200     VT:  monitor 171-200     Impression & Recommendations:  Problem # 1:  SYSTOLIC HEART FAILURE, CHRONIC (ICD-428.22) Stable NYHA III. Volume status looks good but creatinine seems to have hit a new plateau of 1.9-2.0 which is likely cardiorenal in nature. Increase carvedilol to 18.75 bid (increase night dose first then am dose). CPX and labs next month. Given K 4.7 will hold off on adding spiro or back for now or titrating ACE-I.  Prescriptions: COREG 12.5 MG TABS (CARVEDILOL) 1 & 1/2  tablet two times a day  #90 x 6   Entered by:   [name], RN   Authorized by:   Jolaine Artist, MD, [facility]   Signed by:   [name], RN on 08/06/2010   Method used:   Electronically to        [facility] (retail)       [address]       [place], [place]  [address]       Ph: [phone]       Fax: [phone]   RxID:   [id number]

## 2010-12-26 NOTE — Assessment & Plan Note (Signed)
Summary: rov/jss  Medications Added FUROSEMIDE 80 MG TABS (FUROSEMIDE) two times a day      Allergies Added: NKDA  Primary Yamaira Spinner:  [name]   History of Present Illness: Mr. Ficca is a  45 year old male with history of congestive heart failure due to nonischemic cardiomyopathy inital EF 15-25%. On bedside relook 40-45% in 2009.  He also has a history of hypertension, diabetes, ongoing tobacco and alcohol use as well as medical noncompliance.   We saw him for the first time in almost 1.5 years last month. He had been noncompliant with meds and recently restarted on his lasix. We ordered echo and cpx to further evaluate. He returns for routine f/u.  Seen in ER a week or two ago with PNA. Treated with oral abx for 10 days.   Now feeling worse. Very SOB with minimal activity. White cough. + orthopnea and PND. Weight up 15 pounds. No benefit from lasix or metolazone.  No chest pain. Not drinking beer - "I ain't got the tase fo it." No CP.  I reviewed echo today personally dilated LV with EF 15%. RV down as well.   Current Medications (verified): 1)  Carvedilol 12.5 Mg Tabs (Carvedilol) .... Take One Tablet By Mouth Twice A Day 2)  Furosemide 80 Mg Tabs (Furosemide) .... Two Times A Day 3)  Enalapril Maleate 10 Mg Tabs (Enalapril Maleate) .... Take One Tablet By Mouth Once Daily 4)  Digoxin 0.25 Mg Tabs (Digoxin) .... Take One Tablet By Mouth Daily 5)  Janumet 50-1000 Mg Tabs (Sitagliptin-Metformin Hcl) .... Take One Tablet By Mouth Twice Daily. 6)  Carvedilol 25 Mg Tabs (Carvedilol) .... Take One Tablet By Mouth Once Daily 7)  Glucosamine Sulfate   Powd (Glucosamine Sulfate) .... Two Times A Day N 8)  Allopurinol 100 Mg Tabs (Allopurinol) .... 2 Tabs Once Daily  Allergies (verified): No Known Drug Allergies  Past History:  Past Medical History: Last updated: 09/24/2009 CHF due to NICM    --normal cors cath 2006    --last echo 2006. EF  15-20% HTN DM2 Non-compliance Tobacco use ETOH use Gout LBBB  Past Surgical History: Last updated: 07/14/2009  PHYSICIAN:  Nelwyn Salisbury, M.D.  DATE OF BIRTH:  February 21, 1966      DATE OF PROCEDURE:  02/21/2008   DATE OF DISCHARGE:  02/18/2008                                  OPERATIVE REPORT      PROCEDURE PERFORMED:  Screening colonoscopy.      PHYSICIAN:  Nelwyn Salisbury, M.D.  DATE OF BIRTH:  1966-03-19      DATE OF PROCEDURE:  02/17/2008   DATE OF DISCHARGE:                                  OPERATIVE REPORT      PROCEDURE PERFORMED:  Esophagogastroduodenoscopy with antral biopsies.      Family History: Last updated: 10/25/2009  Positive for hypertension and diabetes.   Social History: Last updated: 07/14/2009 The patient drinks occasional alcohol and sometimes in   significant amounts.  The patient smokes three cigarettes per day.   Works as a Dealer.  Denies illicit drug use.      Family History: Reviewed history from 07/14/2009 and no changes required.  Positive for hypertension and diabetes.  Social History: Reviewed history from 07/14/2009 and no changes required. The patient drinks occasional alcohol and sometimes in   significant amounts.  The patient smokes three cigarettes per day.   Works as a Dealer.  Denies illicit drug use.      Review of Systems       As per HPI and past medical history; otherwise all systems negative.   Vital Signs:  Patient profile:   45 year old male Height:      64 inches Weight:      215 pounds BMI:     37.04 Pulse rate:   110 / minute Resp:     16 per minute BP sitting:   108 / 92  (right arm)  Vitals Entered By: Levora Angel, CNA (October 25, 2009 3:36 PM)  Physical Exam  General:  Walks slowly. + SOB HEENT: normal Neck: supple. JVP to forehead. Carotids 2+ bilat; no bruits. No lymphadenopathy or thryomegaly appreciated. Cor: PMI laterally displaced.Tachycardic and regular. loud s3 No rubs. + s3.  2/6 TR Lungs: clear Abdomen: markedly distended. Non tender. liver edge downGood bowel sounds. Extremities: no cyanosis, clubbing, rash. 4+ edema Neuro: alert & orientedx3, cranial nerves grossly intact. moves all 4 extremities w/o difficulty. affect pleasant    Impression & Recommendations:  Problem # 1:  SYSTOLIC HEART FAILURE, ACUTE ON CHRONIC (ICD-428.23) Now with severely decompensated CHF with marked biventricular dysfunction and signifcant volume overload. Not responding to oral therapies. Had long talk with heim about the seriousness of his condition and he was somewhat resistant. However, has agreed to admission. Will admit to stepdown for IV diuresis and possibly ultrafiltration. Hold b-blocker for now. Continue ace-i. Place PICC line for CVPs and co-ox.  Problem # 2:  Diabetes Hold Janumet. Cover with ssi.

## 2010-12-26 NOTE — Letter (Signed)
Summary: Implantable Device Instructions  Press photographer, [place]  [id number] [place]   [place], [place] [address]   Phone: [phone]  Fax: [phone]      Implantable Device Instructions  You are scheduled for:  _____ Implantable Cardioverter Defibrillator  on 11/20/09  with Dr. Rayann Heman  1.  Please arrive at the Center at [facility] at 7:30am on the day of your procedure.  2.  Do not eat or drink after midnight the  night before your procedure.   3.  Do NOT take these medications for the day of  your procedure:  Torsemide and Janumet.    4.  Plan for an overnight stay.  Bring your insurance cards and a list of your medications.  5.  Wash your chest and neck with antibacterial soap (any brand) the evening before and the morning of your procedure.  Rinse well.  6.  Education material received:             ICD             *If you have ANY questions after you get home, please call the office [phone]. Leonia Reader  *Every attempt is made to prevent procedures from being rescheduled.  Due to the nauture of Electrophysiology, rescheduling can happen.  The physician is always aware and directs the staff when this occurs.

## 2010-12-26 NOTE — Progress Notes (Signed)
Summary: Question about medication   Phone Note Call from Patient   Caller: Patient Summary of Call: Pt have question about medication Initial call taken by: Delsa Sale,  Mar 29, 2010 2:22 PM  Follow-up for Phone Call        I talked with pt--pt asking if letter to his lawyer is ready to be picked up-I will forward to [facility] to follow-up with pt

## 2010-12-26 NOTE — Progress Notes (Signed)
Summary: adcirca  Phone Note Other Incoming   Summary of Call: received call from Dr Haroldine Laws that pt needs to be started on Adcirca 10mg  daily titrating to 40mg , application for assistance program completed will have Dr Haroldine Laws sign tom and will faxed in  Initial call taken by: [name], RN,  December 11, 2010 1:59 PM

## 2010-12-26 NOTE — Miscellaneous (Signed)
  Clinical Lists Changes  Medications: Added new medication of CARVEDILOL 12.5 MG TABS (CARVEDILOL) Take one tablet by mouth twice a day Added new medication of FUROSEMIDE 40 MG TABS (FUROSEMIDE) Take one tablet by mouth daily. Added new medication of ENALAPRIL MALEATE 10 MG TABS (ENALAPRIL MALEATE) Take one tablet by mouth twice a day Added new medication of DIGOXIN 0.25 MG TABS (DIGOXIN) Take one tablet by mouth daily

## 2010-12-26 NOTE — Progress Notes (Signed)
Summary: CALLING REGARDING SURGERY   Phone Note Call from Patient Call back at Home Phone [phone]   Caller: Patient Summary of Call: PT CALLING ABOUT A SURGERY. Initial call taken by: Delsa Sale,  November 09, 2009 9:18 AM  Follow-up for Phone Call        pt needs to have ICD placed ASAP due to ins. coverage  no answere x10 rings [name], RN  November 09, 2009 11:34 AM   has not heard anything from Dr Lovena Le regarding getting ICD, needs to have it placed before end of year b/c his ins. runs out then, will discuss w/Kelly on Mon and let pt know [name], RN  November 09, 2009 12:26 PM   PT David Gregory, REQ ANOTHER CALL [name]  November 09, 2009 1:06 PM   Additional Follow-up for Phone Call Additional follow up Details #1::        spoke with will arange ICD placement for him [name], RN, BSN  November 12, 2009 11:16 AM will come in for ICD work up with Dr Lovena Le on Wen 12/22 at 3:15 [name], RN, BSN  November 12, 2009 4:35 PM

## 2010-12-26 NOTE — Miscellaneous (Signed)
Summary: Device preload  Clinical Lists Changes  Observations: Added new observation of ICD INDICATN: CM (11/20/2009 15:24) Added new observation of ICDLEADSTAT3: active (11/20/2009 15:24) Added new observation of ICDLEADSER3: [id number] V (11/20/2009 15:24) Added new observation of ICDLEADMOD3: [id number]  (11/20/2009 15:24) Added new observation of ICDLEADLOC3: LV  (11/20/2009 15:24) Added new observation of ICDLEADSTAT2: active  (11/20/2009 15:24) Added new observation of ICDLEADSER2: [id number] V  (11/20/2009 15:24) Added new observation of ICDLEADMOD2: [id number]  (11/20/2009 15:24) Added new observation of ICDLEADLOC2: RV  (11/20/2009 15:24) Added new observation of ICDLEADSTAT1: active  (11/20/2009 15:24) Added new observation of ICDLEADSER1: [id number]  (11/20/2009 15:24) Added new observation of ICDLEADMOD1: 5076  (11/20/2009 15:24) Added new observation of ICDLEADLOC1: RA  (11/20/2009 15:24) Added new observation of ICD IMP MD: Thompson Grayer, MD  (11/20/2009 15:24) Added new observation of ICDLEADDOI3: 11/20/2009  (11/20/2009 15:24) Added new observation of ICDLEADDOI2: 11/20/2009  (11/20/2009 15:24) Added new observation of ICDLEADDOI1: 11/20/2009  (11/20/2009 15:24) Added new observation of ICD IMPL DTE: 11/20/2009  (11/20/2009 15:24) Added new observation of ICD SERL#: [id number]  (11/20/2009 15:24) Added new observation of ICD MODL#: [id number]  (11/20/2009 15:24) Added new observation of ICDMANUFACTR: [employer]tronic  (11/20/2009 15:24) Added new observation of ICD MD: Thompson Grayer, MD  (11/20/2009 15:24)       ICD Specifications Following MD:  Thompson Grayer, MD     ICD Vendor:  [employer]tronic     ICD Model Number:  [id number]     ICD Serial Number:  [id number] ICD DOI:  11/20/2009     ICD Implanting MD:  Thompson Grayer, MD  Lead 1:    Location: RA     DOI: 11/20/2009     Model #: [id number]     Serial #: [id number]     Status: active Lead 2:    Location: RV     DOI: 11/20/2009     Model #: [id number]     Serial  #: [id number]     Status: active Lead 3:    Location: LV     DOI: 11/20/2009     Model #: [id number]     Serial #[id number]     Status: active  Indications::  CM

## 2010-12-26 NOTE — Progress Notes (Signed)
Summary: REQUESTING A CALL WOULD NOT GIVE ANY INFORMATION   Phone Note Call from Patient Call back at Home Phone [phone]   Caller: Patient Summary of Call: PT IS REQUESTING ACALL Initial call taken by: Delsa Sale,  January 24, 2010 11:13 AM  Follow-up for Phone Call        He called worried about his disabilty papers.  He has an appointment today and Dr Haroldine Laws is going to help him with it. [name], RN, BSN  January 24, 2010 12:04 PM

## 2010-12-26 NOTE — Cardiovascular Report (Signed)
Summary: Pre Op Orders  Pre Op Orders   Imported By: Sallee Provencal 11/19/2009 16:45:21  _____________________________________________________________________  External Attachment:    Type:   Image     Comment:   External Document

## 2010-12-26 NOTE — Assessment & Plan Note (Signed)
Summary: per check out/sf   Visit Type:  Follow-up Primary David Gregory:  David Gregory  CC:  no compliants.  History of Present Illness: David Gregory is a 45 year old male with history of congestive heart failure due to nonischemic cardiomyopathy iniital EF 15-25%.  He also has a history of hypertension, diabetes, CRI, previous tobacco and alcohol use as well as medical noncompliance.   He was hospitalized in 12/10 of last year with low output state and was found to have worsening of his LV function with an EF of 15%.  He required inotrope support. Since that time he has underwent placement of a BiVICD. Weight on d/c was 184 pounds.   ACE-I dose limited by hyperkalemia. Also reason he is no longer spironolactone. Last check K was 4.9. Remains on hydralazine but nor on Imdur due to using Viagra.   Returns for f/u. At last visit we increased coreg to 25 two times a day. Tolerated well. Feels fine - able to walk around [employer] without stopping. Weight up about 5 pounds but attributes to eating before he came to the office. At home weight typically 185-190 and he was 190 at home today. Takes metoalzone if over 190. Has not had to take one recently.  Denies CP or SOB. No LE edema.  Just got Medicaid approved.   CPX (10/10): pVO2 12.6 (40%) Slope 51 RER 1.13 O2 pulse 53%   No ETOH or cigarettes since 12/10.    Current Medications (verified): 1)  Carvedilol 25 Mg Tabs (Carvedilol) .... Take One Tablet By Mouth Twice A Day 2)  Torsemide 20 Mg Tabs (Torsemide) .... Take One Tablet By Mouth Two Times A Day 3)  Lanoxin 0.25 Mg Tabs (Digoxin) .... Take 1 Tablet By Mouth Once A Day 4)  Janumet 50-1000 Mg Tabs (Sitagliptin-Metformin Hcl) .... Take One Tablet By Mouth Twice Daily. 5)  Glucosamine Sulfate   Powd (Glucosamine Sulfate) .... Two Times A Day 6)  Allopurinol 100 Mg Tabs (Allopurinol) .... 2 Tabs Once Daily 7)  Hydralazine Hcl 50 Mg Tabs (Hydralazine Hcl) .... Take One Tablet By Mouth Three Times A  Day 8)  Accupril 5 Mg Tabs (Quinapril Hcl) .... Take 1 Tablet By Mouth Two Times A Day 9)  Metolazone 2.5 Mg Tabs (Metolazone) .... Take One Tablet By Mouth Daily As Needed 10)  Potassium Chloride Crys Cr 20 Meq Cr-Tabs (Potassium Chloride Crys Cr) .... Take One Tablet By Mouth Daily As Directed 11)  Viagra 100 Mg Tabs (Sildenafil Citrate) .... Take One Tablet As Needed As Directed  Allergies (verified): No Known Drug Allergies  Past History:  Past Medical History: Last updated: 09/02/2010 CHF due to NICM    --normal cors cath 2006    --admission for low output HF - requiring milrinone 12/10    --last echo 12/10. EF 15-20%. Restrictive physiology    --s/p BiVICd    --CPX (10/10): pVO2 12.6 (40%) Slope 51 RER 1.13 O2 pulse 53%  HTN DM2 LBBB Gout h/o non-compliance h/o Tobacco & ETOH use - now quit  Review of Systems       As per HPI and past medical history; otherwise all systems negative.   Vital Signs:  Patient profile:   45 year old male Height:      64 inches Weight:      198 pounds BMI:     34.11 Pulse rate:   75 / minute BP sitting:   118 / 80  (left arm) Cuff size:   regular  Vitals  Entered By: David Gregory, [place] (October 16, 2010 8:47 AM)  Physical Exam  General:  Alert, No acute distress. Respirations unlabored. HEENT: normal Neck: supple. JVP 8 Carotids 2+ bilat; no bruits. No lymphadenopathy or thryomegaly appreciated. Cor: PMI laterally displaced.Regular rate and rhythm. no s3  No rubs.  2/6 TR Lungs: clear Abdomen:No distended Non tender. Good bowel sounds. Extremities: no cyanosis, clubbing, rash. no edema Neuro: alert & orientedx3, cranial nerves grossly intact. moves all 4 extremities w/o difficulty. affect pleasant     ICD Specifications Following MD:  Thompson Grayer, MD     Referring MD:  [facility] ICD Vendor:  [employer]     ICD Model Number:  [id number]     ICD Serial Number:  [id number] ICD DOI:  11/20/2009     ICD Implanting MD:  Thompson Grayer, MD  Lead 1:    Location: RA     DOI: 11/20/2009     Model #: [id number]     Serial #: [id number]     Status: active Lead 2:    Location: RV     DOI: 11/20/2009     Model #: [id number]     Serial #: [id number]     Status: active Lead 3:    Location: LV     DOI: 11/20/2009     Model #: [id number]     Serial #: [id number]     Status: active  Indications::  CM   ICD Follow Up ICD Dependent:  No       ICD Device Measurements Configuration: LV TIP TO RV COIL  Episodes Coumadin:  No  Brady Parameters Mode DDD     Lower Rate Limit:  60     Upper Rate Limit 130 PAV 130     Sensed AV Delay:  100  Tachy Zones VF:  200     VT:  monitor 171-200     Impression & Recommendations:  Problem # 1:  SYSTOLIC HEART FAILURE, CHRONIC (ICD-428.22) Doing well. Now early NYHA III with mild volume overload but does have prominent S3 on exam. Will bump diuretic up for day or two. Has been very compliant with meds and free of tobacco for 1 year. Will check labs again today. If potassium 4.7 or less will try to bump lisinopril to 5 two times a day. Otherwise continue current therapy. After the New Year we will send him down to [facility] to plug him into the transplant clinic but currently doing fine with medical therapy. Repeat echo. Will need repeat CPX and RHC in spring or sooner if symptoms worsen.    Other Orders: T- * Misc. Laboratory test [phone]) Echocardiogram (Echo) T- * Misc. Laboratory test [phone]) TLB-BNP (B-Natriuretic Peptide) (83880-BNPR)  Patient Instructions: 1)  Labs today 2)  Follow up in 1 month 3)  Your physician has requested that you have an echocardiogram.  Echocardiography is a painless test that uses sound waves to create images of your heart. It provides your doctor with information about the size and shape of your heart and how well your heart's chambers and valves are working.  This procedure takes approximately one hour. There are no restrictions for this procedure.

## 2010-12-26 NOTE — Progress Notes (Signed)
Summary: med question  Medications Added CARVEDILOL 6.25 MG TABS (CARVEDILOL) Take one tablet by mouth twice a day       Phone Note Call from Patient Call back at Home Phone [phone]   Caller: Patient Reason for Call: Talk to Nurse Summary of Call: request to speak to the nurse about meds Initial call taken by: [name],  December 10, 2009 10:32 AM  Follow-up for Phone Call        pt calling about  medication--he was told to increase Coreg to 12.5mg  twice a day from 3.125mg  twice a day 12-03-09 at time of device check by Dr Allred--pt wants Dr Haroldine Laws to be aware of this--he would like a call back Tuesday --I will forward to [place] and Dr Verlan Friends, RN, BSN  December 10, 2009 10:46 AM   Additional Follow-up for Phone Call Additional follow up Details #1::        ok to increase coreg to 6.25 two times a day. not 12.5 two times a day Jolaine Artist, MD, [facility]  December 11, 2009 12:30 AM  pt is aware will cont. coreg 6.25mg  two times a day [name], RN  December 11, 2009 4:27 PM     New/Updated Medications: CARVEDILOL 6.25 MG TABS (CARVEDILOL) Take one tablet by mouth twice a day

## 2010-12-26 NOTE — Miscellaneous (Signed)
Summary: Orders Update  Clinical Lists Changes  Orders: Added new Test order of T-Drug Gay[facility] Face [facility]) [phone]) - Signed Added new Test order of T-Nicotine Assay & Metabolites, Serum [phone]) - Signed

## 2010-12-26 NOTE — Assessment & Plan Note (Signed)
Summary: 4wk f/u per amber need device check sameday call amber  Medications Added HYDRALAZINE HCL 25 MG TABS (HYDRALAZINE HCL) Take 1 tablet by mouth three times a day      Allergies Added: NKDA  Primary Provider:  [name]  CC:  pain with ICD.  History of Present Illness: David Gregory is a  45 year old male with history of congestive heart failure due to nonischemic cardiomyopathy inital EF 15-25%.  He also has a history of hypertension, diabetes, ongoing tobacco and alcohol use as well as medical noncompliance.   He was hospitalized in 12/10 of last year with low output state and was found to have worsening of his LV function with an EF of 15%.  He required intrope support. Since that time he has underwent placement of a BiVICD. Coreg increased by Dr. Rayann Heman due to sinus tach.  Returns today for routine f/u.  ICD interrogated in clinic and remaians in sinus tach with HRs around 100 and occasioanl up to 120-130. No sustained VT.   No longer working. Very fatigued particulalry after coreg increased. Can walk a few blocks before he has to stop. No edema. No orthopnea or PND. No CP. Still trying to get used to ICD. Feels funny at site. No feversw or chills or drainage at site. No cigarettes or ETOH. Only taking hydralazine 25 two times a day (prescribed at 37.5 three times a day) . Still waiting to hear about disability.   Current Medications (verified): 1)  Carvedilol 6.25 Mg Tabs (Carvedilol) .... Take One Tablet By Mouth Twice A Day 2)  Torsemide 20 Mg Tabs (Torsemide) .... Take One Tablet By Mouth Two Times A Day 3)  Digoxin 0.25 Mg Tabs (Digoxin) .... Take One Tablet By Mouth Daily 4)  Janumet 50-1000 Mg Tabs (Sitagliptin-Metformin Hcl) .... Take One Tablet By Mouth Twice Daily. 5)  Glucosamine Sulfate   Powd (Glucosamine Sulfate) .... Two Times A Day N 6)  Allopurinol 100 Mg Tabs (Allopurinol) .... 2 Tabs Once Daily 7)  Hydralazine Hcl 25 Mg Tabs (Hydralazine Hcl) .... Take 1  Tablet By Mouth Two Times A Day 8)  Lisinopril 5 Mg Tabs (Lisinopril) .... Take One Tablet By Mouth Daily 9)  Spironolactone 25 Mg Tabs (Spironolactone) .... Take One Tablet By Mouth Daily  Allergies (verified): No Known Drug Allergies  Past History:  Past Medical History: CHF due to NICM    --normal cors cath 2006    --last echo 2006. EF 15-20%   -- HTN DM2 Non-compliance Tobacco use ETOH use Gout LBBB  Review of Systems       As per HPI and past medical history; otherwise all systems negative.   Vital Signs:  Patient profile:   45 year old male Height:      64 inches Weight:      191 pounds BMI:     32.90 Pulse rate:   97 / minute BP sitting:   102 / 76  (left arm) Cuff size:   regular  Vitals Entered By: Mignon Pine, RMA (January 02, 2010 10:33 AM)  Physical Exam  General:  Looks ok. No acute distress. Respirations unlabored. HEENT: normal Neck: supple. JVP flat. Carotids 2+ bilat; no bruits. No lymphadenopathy or thryomegaly appreciated. Cor: PMI laterally displaced.Tachycardic and regular.+ s3 No rubs.  2/6 TR Lungs: clear Abdomen:No distended Non tender. Good bowel sounds. Extremities: no cyanosis, clubbing, rash. no edema Neuro: alert & orientedx3, cranial nerves grossly intact. moves all 4 extremities w/o difficulty. affect  pleasant     ICD Specifications Following MD:  Thompson Grayer, MD     Referring MD:  [facility] ICD Vendor:  [employer]     ICD Model Number:  [id number]     ICD Serial Number:  [id number] ICD DOI:  11/20/2009     ICD Implanting MD:  Thompson Grayer, MD  Lead 1:    Location: RA     DOI: 11/20/2009     Model #: [id number]     Serial #: [id number]     Status: active Lead 2:    Location: RV     DOI: 11/20/2009     Model #: [id number]     Serial #: [id number]     Status: active Lead 3:    Location: LV     DOI: 11/20/2009     Model #: [id number]     Serial #[id number]     Status: active  Indications::  CM   ICD Follow Up ICD Dependent:  No        ICD Device Measurements Configuration: LV TIP TO RV COIL  Brady Parameters Mode DDD     Lower Rate Limit:  60     Upper Rate Limit 130 PAV 130     Sensed AV Delay:  100  Tachy Zones VF:  200     Impression & Recommendations:  Problem # 1:  SYSTOLIC HEART FAILURE, CHRONIC (ICD-428.22) Much improved but still tenuous NYHA III symptoms. Volume status much better. Had a long talk about guarded prognosis over next year or so and need to be very compliant with regimen. Also discussed use of sliding scale lasix. Change hydralazine to 25 three times a day. Get CPX test. Monthly urine drug and cotinine checks. Check CMET and BNP today. Will see back monthly, if not more frequently.   Other Orders: CPX Test at [facility] (CPX Test) TLB-BMP (Basic Metabolic Panel-BMET) ([id number]) TLB-BNP (B-Natriuretic Peptide) (83880-BNPR) TLB-Hepatic/Liver Function Pnl (80076-HEPATIC) T-Nicotine Assay & Metabolites, Serum [id number]) Drug Screen-Urine, (single) ([id number])  Patient Instructions: 1)  Increase Hydralazine to 25mg  three times a day  2)  Labs today 3)  Patient will need monthly nicotine levels and urine drug screen 4)  Your physician has recommended that you have a cardiopulmonary stress test (CPX).  CPX testing is a non-invasive measurement of heart and lung function. It replaces a traditional treadmill stress test. This type of test provides a tremendous amount of information that relates not only to your present condition but also for future outcomes.  This test combines measurements of your ventilation, respiratory gas exchange in the lungs, electrocardiogram (EKG), blood pressure and physical response before, during, and following an exercise protocol. Prescriptions: HYDRALAZINE HCL 25 MG TABS (HYDRALAZINE HCL) Take 1 tablet by mouth three times a day  #90 x 6   Entered by:   [name], RN   Authorized by:   Jolaine Artist, MD, [facility]   Signed by:   [name],  RN on 01/02/2010   Method used:   Electronically to        [facility] (retail)       [address]       [place], [place]  [address]       Ph: [phone]       Fax: [phone]   RxID:   [id number]

## 2010-12-26 NOTE — Progress Notes (Signed)
Summary: PT REQUEST CALL   Phone Note Call from Patient   Caller: Patient Reason for Call: Talk to Nurse Initial call taken by: Delsa Sale,  February 28, 2010 1:42 PM  Follow-up for Phone Call        spoke w/pt let him know info for disability was sent out on Tue per [employer] dept., pt request I call ss office [name] at [phone] ext [phone] on Monday to f/u, will call mon [name], RN  February 28, 2010 3:44 PM   left mess for Mr David Gregory to call back [name], RN  March 04, 2010 2:59 PM   returning call, [name]  March 07, 2010 11:45 AM  Left message to call back [name], RN  March 12, 2010 3:53 PM

## 2010-12-26 NOTE — Progress Notes (Signed)
   Request recieved from [facility] forwarded to [employer] for processing [facility]  February 18, 2010 9:48 AM    Appended Document:  2nd Request for records recieved from [facility]DS forwarded to [facility]

## 2010-12-26 NOTE — Assessment & Plan Note (Signed)
Summary: R WRIST PAIN   Vital Signs:  Patient profile:   45 year old male BP sitting:   94 / 61  Vitals Entered By: David Gregory CMA (June 26, 2010 2:48 PM)  Primary David Gregory:  David Gregory   History of Present Illness: 45 yo M h/o R CTS for several years previously followed by [name] [place] here for 2nd opinion and eval. Has had CTS for several years, has received 3 prev CSI injections with good relief with first two.  Worsened pain over last several months, however most recent CSI 3 months ago did not provide any relief.  He subsequently got an EMG that showed decreased latency, velocity, and amplitude c/w severe median nerve entrapment.  It was recommended he get CTR with Dr. [name], however pt states he couldn't afford it because he recently lost his insurance and is trying to get established with a [name] to hopefully get insurance via [name].  Also states he initially got rejected for disability (applied because of severe NICM and has ICD in place) but is still trying to acquire it. At present, he continues to complain of R wrist pain with radiating tinlging and pain in 1st 3 fingers.  Also c/o grip weakness and unable to comb his hair.  Previously used splints, could not tolerate it during the day, was helping at night until recently so he quit using it.   Allergies: No Known Drug Allergies PMH-FH-SH reviewed-no changes except otherwise noted  Physical Exam  General:  alert and normal appearance but has somewhat chronically ill appearing look and appears older than stated age.   Wrist/Hand Exam  Hand Exam:    Right:    Inspection/Palpation:  Atrophy of thenar eminence.  Weakness with thumb-pinky test.  Nl strength with finger abd and wrist ext.  Tinel's:    + R-carpal.  + Phalen's.  Phalen's Compression:    Right < 15 seconds   Impression & Recommendations:  Problem # 1:  CARPAL TUNNEL SYNDROME (ICD-354.0) Assessment Deteriorated Pt  with severe disease based on EMG and needs surgery, however difficult insurance/logistics situation.  Now that he has had initial visit with [place], we recommended he go directly over to [facility] for appt.  We explained he really needs surgery to avoid permanent damage.  Plan for him to be in rotation for an indigent case performed by ortho for CTR.  He will also continue trying to get disability, which I think he likely qualifies for based on his poor EF of 15%. - for now continue night splint and avoid aggravating activities - he declined CSI today which is unlikely to help anyway - f/u as needed  We spent 30 minutes with pt, > 50% spent counseling and coordination of care.  Complete Medication List: 1)  Carvedilol 6.25 Mg Tabs (Carvedilol) .... Take 1 & 1/2 tablet by mouth twice a day 2)  Torsemide 20 Mg Tabs (Torsemide) .... Take one tablet by mouth two times a day 3)  Lanoxin 0.25 Mg Tabs (Digoxin) .... Take 1 tablet by mouth once a day 4)  Janumet 50-1000 Mg Tabs (Sitagliptin-metformin hcl) .... Take one tablet by mouth twice daily. 5)  Glucosamine Sulfate Powd (Glucosamine sulfate) .... Two times a day 6)  Allopurinol 100 Mg Tabs (Allopurinol) .... 2 tabs once daily 7)  Hydralazine Hcl 50 Mg Tabs (Hydralazine hcl) .... Take one tablet by mouth three times a day 8)  Accupril 5 Mg Tabs (Quinapril hcl) .Marland KitchenMarland KitchenMarland Kitchen  Take 1 tablet by mouth two times a day 9)  Spironolactone 25 Mg Tabs (Spironolactone) .... Take one tablet by mouth daily 10)  Metolazone 2.5 Mg Tabs (Metolazone) .... Take one tablet by mouth daily as needed 11)  Potassium Chloride Crys Cr 20 Meq Cr-tabs (Potassium chloride crys cr) .... Take one tablet by mouth daily as directed

## 2010-12-26 NOTE — Assessment & Plan Note (Signed)
Summary: nurse visit with heather schub/  Nurse Visit   Vital Signs:  Patient profile:   45 year old male Weight:      185 pounds Pulse rate:   60 / minute BP sitting:   102 / 74  Vitals Entered By: [name], RN (June 07, 2010 10:30 AM)  Current Medications (verified): 1)  Carvedilol 6.25 Mg Tabs (Carvedilol) .... Take 1 & 1/2 Tablet By Mouth Twice A Day 2)  Torsemide 20 Mg Tabs (Torsemide) .... Take One Tablet By Mouth Two Times A Day 3)  Lanoxin 0.25 Mg Tabs (Digoxin) .... Take 1 Tablet By Mouth Once A Day 4)  Janumet 50-1000 Mg Tabs (Sitagliptin-Metformin Hcl) .... Take One Tablet By Mouth Twice Daily. 5)  Glucosamine Sulfate   Powd (Glucosamine Sulfate) .... Two Times A Day 6)  Allopurinol 100 Mg Tabs (Allopurinol) .... 2 Tabs Once Daily 7)  Hydralazine Hcl 50 Mg Tabs (Hydralazine Hcl) .... Take One Tablet By Mouth Three Times A Day 8)  Accupril 5 Mg Tabs (Quinapril Hcl) .... Take 1 Tablet By Mouth Two Times A Day 9)  Spironolactone 25 Mg Tabs (Spironolactone) .... Take One Tablet By Mouth Daily 10)  Metolazone 2.5 Mg Tabs (Metolazone) .... Take One Tablet By Mouth Daily As Needed 11)  Potassium Chloride Crys Cr 20 Meq Cr-Tabs (Potassium Chloride Crys Cr) .... Take One Tablet By Mouth Daily As Directed  Allergies (verified): No Known Drug Allergies  Impression & Recommendations:  Problem # 1:  SYSTOLIC HEART FAILURE, ACUTE ON CHRONIC (ICD-428.23) Pt in for f/u of wt and optivol check, pts wt is down 4 lbs, his optivol is back down, he states he feels better.  Pt states that he needs surgery for carpal tunnel and needs clearance, will send info to Dr Haroldine Laws.  Pt is aware to continue to monitor wt at home and can take extra metolazone as needed or call the office, he is agreeable. [name], RN  June 07, 2010 10:33 AM   Appended Document: nurse visit with heather schub/ sounds good. i am ok with carpal tunnel surgery as long as local or regional anesthesia and not  general anesthesia.  Appended Document: nurse visit with heather schub/ see phone mess regarding surgery

## 2010-12-26 NOTE — Assessment & Plan Note (Signed)
Summary: 1 month rov.sl  Medications Added COREG 12.5 MG TABS (CARVEDILOL) 1 tablet two times a day      Allergies Added: NKDA  Primary Provider:  [name]   History of Present Illness: David Gregory  is a  45 year old male with history of congestive heart failure due to nonischemic cardiomyopathy iniital EF 15-25%.  He also has a history of hypertension, diabetes, previous tobacco and alcohol use as well as medical noncompliance.   He was hospitalized in 12/10 of last year with low output state and was found to have worsening of his LV function with an EF of 15%.  He required inotrope support. Since that time he has underwent placement of a BiVICD. Weight on d/c was 184 pounds.   Over past few weeks we have been adjusting diuretics as he had progressive renal insufficiency. Underwent RHC 07/09/2010:   RA 11 RV 45/5/11 PA 44/17 (28)  PCWP 11 Pulmonary artery saturations were 57% and 55% on room air.  Femoral artery saturations were 92%.  Fick cardiac output was 3.9 liters per minute.  Cardiac index 2.0.  Thermodilution was 3.7 and 1.9.  Pulmonary vascular resistance was 4.6 Woods units.  Returns for routine f/u. Feels ok but gets SOB if he tries to exert himself too much. Weight up and down a bit.  No current orthopnea, PND or edema. Compliant with meds. [name] recently stopped due to worsening renal function.   No ETOH or cigarettes since 12/10.  Optivol index up and down. most recently trending up. Weight up 4 pounds.     Current Medications (verified): 1)  Coreg 12.5 Mg Tabs (Carvedilol) .Marland Kitchen.. 1 Tablet Two Times A Day 2)  Torsemide 20 Mg Tabs (Torsemide) .... Take One Tablet By Mouth Two Times A Day 3)  Lanoxin 0.25 Mg Tabs (Digoxin) .... Take 1 Tablet By Mouth Once A Day 4)  Janumet 50-1000 Mg Tabs (Sitagliptin-Metformin Hcl) .... Take One Tablet By Mouth Twice Daily. 5)  Glucosamine Sulfate   Powd (Glucosamine Sulfate) .... Two Times A Day 6)  Allopurinol 100 Mg Tabs (Allopurinol)  .... 2 Tabs Once Daily 7)  Hydralazine Hcl 50 Mg Tabs (Hydralazine Hcl) .... Take One Tablet By Mouth Three Times A Day 8)  Accupril 5 Mg Tabs (Quinapril Hcl) .... Take 1 Tablet By Mouth Two Times A Day 9)  Metolazone 2.5 Mg Tabs (Metolazone) .... Take One Tablet By Mouth Daily As Needed 10)  Potassium Chloride Crys Cr 20 Meq Cr-Tabs (Potassium Chloride Crys Cr) .... Take One Tablet By Mouth Daily As Directed  Allergies (verified): No Known Drug Allergies  Past History:  Past Medical History: Last updated: 03/18/2010 CHF due to NICM    --normal cors cath 2006    --admission for low output HF - requiring milrinone 12/10    --last echo 12/10. EF 15-20%. Restrictive physiology    --s/p BiVICd HTN DM2 LBBB Gout h/o non-compliance h/o Tobacco & ETOH use - now quit  Review of Systems       As per HPI and past medical history; otherwise all systems negative.   Vital Signs:  Patient profile:   44 year old male Height:      64 inches Weight:      189 pounds BMI:     32.56 Pulse rate:   60 / minute Resp:     16 per minute BP sitting:   104 / 62  (right arm)  Vitals Entered By: Levora Angel, CNA (July 30, 2010 9:11  AM)  Physical Exam  General:  Alert, No acute distress. Respirations unlabored. HEENT: normal Neck: supple. JVP 6 Carotids 2+ bilat; no bruits. No lymphadenopathy or thryomegaly appreciated. Cor: PMI laterally displaced.Regular rate and rhtyhm. +s3  No rubs.  2/6 TR Lungs: clear Abdomen:No distended Non tender. Good bowel sounds. Extremities: no cyanosis, clubbing, rash. no edema Neuro: alert & orientedx3, cranial nerves grossly intact. moves all 4 extremities w/o difficulty. affect pleasant     ICD Specifications Following MD:  Thompson Grayer, MD     Referring MD:  [facility] ICD Vendor:  [employer]     ICD Model Number:  [id number]     ICD Serial Number:  [id number] ICD DOI:  11/20/2009     ICD Implanting MD:  Thompson Grayer, MD  Lead 1:    Location: RA      DOI: 11/20/2009     Model #: [id number]     Serial #: [id number]     Status: active Lead 2:    Location: RV     DOI: 11/20/2009     Model #: [id number]     Serial #: [id number]     Status: active Lead 3:    Location: LV     DOI: 11/20/2009     Model #: [id number]     Serial #: [id number]     Status: active  Indications::  CM   ICD Follow Up ICD Dependent:  No       ICD Device Measurements Configuration: LV TIP TO RV COIL  Brady Parameters Mode DDD     Lower Rate Limit:  60     Upper Rate Limit 130 PAV 130     Sensed AV Delay:  100  Tachy Zones VF:  200     VT:  monitor 171-200     Impression & Recommendations:  Problem # 1:  SYSTOLIC HEART FAILURE, CHRONIC (ICD-428.22) Doing reasonably well. NYHA III. Volume status looks ok though renal function seems to have reset a higher baseline. Will continue current regimen and arrange for him to have CPX test.   Other Orders: EKG w/ Interpretation (93000) T-Nicotine Assay & Metabolites, Serum [phone]) T-Drug Lynnell Chad, ea (mullti) [phone]) CPX Test at [facility] (CPX Test) TLB-BNP (B-Natriuretic Peptide) (83880-BNPR) TLB-BMP (Basic Metabolic Panel-BMET) (99991111)  Patient Instructions: 1)  Your physician recommends that you schedule a follow-up appointment in: 1 to 2 weeks with Dr Haroldine Laws and 3 months in pacer clinic 2)  Your physician recommends that you have  lab work today 3)  Your physician recommends that you continue on your current medications as directed. Please refer to the Current Medication list given to you today. 4)  Your physician has recommended that you have a cardiopulmonary stress test (CPX).  CPX testing is a non-invasive measurement of heart and lung function. It replaces a traditional treadmill stress test. This type of test provides a tremendous amount of information that relates not only to your present condition but also for future outcomes.  This test combines measurements of your ventilation,  respiratory gas exchange in the lungs, electrocardiogram (EKG), blood pressure and physical response before, during, and following an exercise protocol.

## 2010-12-26 NOTE — Assessment & Plan Note (Signed)
Summary: per check out  Medications Added LISINOPRIL 10 MG TABS (LISINOPRIL) Take one tablet by mouth daily METOLAZONE 2.5 MG TABS (METOLAZONE) Take one tablet by mouth daily as needed      Allergies Added: NKDA  Visit Type:  Follow-up Primary Provider:  [name]  CC:  No complaints.  History of Present Illness: [name] is a  45 year old male with history of congestive heart failure due to nonischemic cardiomyopathy inital EF 15-25%.  He also has a history of hypertension, diabetes, previous tobacco and alcohol use as well as medical noncompliance.   He was hospitalized in 12/10 of last year with low output state and was found to have worsening of his LV function with an EF of 15%.  He required intrope support. Since that time he has underwent placement of a BiVICD. Weight on d/c was 184 pounds.   At last visit hydralazine titrated to 50 three times a day. Returns for monthly follow-up.  Is frustrated because he got denied for disability again. Feels ok. Gets tired easily but it is improving. Breathing is ok. No CP, orthopnea or PND. Weight stable. No swelling.   ICD interrogated today and had 7 brief runs of NSVT. No therapies. OptiVol way up. Activity level up as well. LV lead threshold ok.  No ETOH or cigarettes since 12/10.  Last blood work in March. Cotinine negative. K+ 4.5.    Current Medications (verified): 1)  Carvedilol 6.25 Mg Tabs (Carvedilol) .... Take One Tablet By Mouth Twice A Day 2)  Torsemide 20 Mg Tabs (Torsemide) .... Take One Tablet By Mouth Two Times A Day 3)  Digoxin 0.25 Mg Tabs (Digoxin) .... Take One Tablet By Mouth Daily 4)  Janumet 50-1000 Mg Tabs (Sitagliptin-Metformin Hcl) .... Take One Tablet By Mouth Twice Daily. 5)  Glucosamine Sulfate   Powd (Glucosamine Sulfate) .... Two Times A Day N 6)  Allopurinol 100 Mg Tabs (Allopurinol) .... 2 Tabs Once Daily 7)  Hydralazine Hcl 50 Mg Tabs (Hydralazine Hcl) .... Take One Tablet By Mouth Three Times A  Day 8)  Lisinopril 5 Mg Tabs (Lisinopril) .... Take One Tablet By Mouth Daily 9)  Spironolactone 25 Mg Tabs (Spironolactone) .... Take One Tablet By Mouth Daily 10)  Metolazone 2.5 Mg Tabs (Metolazone) .... Take One Tablet By Mouth Daily As Needed 11)  Potassium Chloride Crys Cr 20 Meq Cr-Tabs (Potassium Chloride Crys Cr) .... Take One Tablet By Mouth Daily As Directed  Allergies (verified): No Known Drug Allergies  Past History:  Past Medical History: CHF due to NICM    --normal cors cath 2006    --admission for low output HF - requiring milrinone 12/10    --last echo 12/10. EF 15-20%. Restrictive physiology    --s/p BiVICd HTN DM2 LBBB Gout h/o non-compliance h/o Tobacco & ETOH use - now quit  Review of Systems       As per HPI and past medical history; otherwise all systems negative.   Vital Signs:  Patient profile:   45 year old male Height:      64 inches Weight:      192 pounds BMI:     33.08 Pulse rate:   89 / minute Resp:     16 per minute BP sitting:   100 / 70  (left arm)  Vitals Entered By: Levora Angel, CNA (March 18, 2010 11:02 AM)  Physical Exam  General:  Looks ok. No acute distress. Respirations unlabored. HEENT: normal Neck: supple. JVP flat. Carotids  2+ bilat; no bruits. No lymphadenopathy or thryomegaly appreciated. Cor: PMI laterally displaced.Regular rate and rhtyhm. + soft s3 No rubs.  2/6 TR Lungs: clear Abdomen:No distended Non tender. Good bowel sounds. Extremities: no cyanosis, clubbing, rash. no edema Neuro: alert & orientedx3, cranial nerves grossly intact. moves all 4 extremities w/o difficulty. affect pleasant     ICD Specifications Following MD:  Thompson Grayer, MD     Referring MD:  [facility] ICD Vendor:  [employer]     ICD Model Number:  [id number]     ICD Serial Number:  [id number] ICD DOI:  11/20/2009     ICD Implanting MD:  Thompson Grayer, MD  Lead 1:    Location: RA     DOI: 11/20/2009     Model #: [id number]     Serial #: [id number]      Status: active Lead 2:    Location: RV     DOI: 11/20/2009     Model #: [id number]     Serial #: [id number]     Status: active Lead 3:    Location: LV     DOI: 11/20/2009     Model #: [id number]     Serial #[id number]     Status: active  Indications::  CM   ICD Follow Up Remote Check?  No Battery Voltage:  3.18 V     Charge Time:  9.3 seconds     Underlying rhythm:  NSR ICD Dependent:  No       ICD Device Measurements Atrium:  Amplitude: 2.9 mV, Impedance: 475 ohms,  Right Ventricle:  Amplitude: 6.8 mV, Impedance: 342 ohms,  Left Ventricle:  Impedance: 437 ohms, Threshold: 0.5 V at .4 msec Configuration: LV TIP TO RV COIL Shock Impedance: 37/44 ohms   Episodes MS Episodes:  0     Shock:  0     ATP:  0     Nonsustained:  7     Atrial Therapies:  n/a ICD Appropriate Therapy?  Yes Atrial Pacing:  1.5%     Ventricular Pacing:  100  Brady Parameters Mode DDD     Lower Rate Limit:  60     Upper Rate Limit 130 PAV 130     Sensed AV Delay:  100  Tachy Zones VF:  200     Tech Comments:  Optivol demonstrates elevated intrathoracic impedence since mid to late March greater than 200 omhs.  Nonsustained episodes- most recent 02/15/10 lasting 4 seconds at 216 bpm. All other episodes 1 second in duration. No changes made to system. - Per Corene Cornea.  Impression & Recommendations:  Problem # 1:  SYSTOLIC HEART FAILURE, CHRONIC (ICD-428.22) Despite Optivol reading, he looks quite good without any volume overload and increasing activity level. Now NYHA 2-3. Will not change diuretics at this time. Increase lisinopril to 10 once daily. Will plan on increasing cardvedilol at next visit.  Problem # 2:  VENTRICULAR TACHYCARDIA (ICD-427.1) Non-sustained. ICD in place. Continmue current regimen. Will titrate b-blocker at next visist. Recent electrolytes looked ok. Would like to avoid blood draw today due to financial constraints.  Patient Instructions: 1)  Your physician recommends that you schedule a follow-up  appointment in: 1 month. 2)  Your physician has recommended you make the following change in your medication: 1) increase lisinopril to 10mg  once daily. Prescriptions: LISINOPRIL 10 MG TABS (LISINOPRIL) Take one tablet by mouth daily  #30 x 6   Entered by:   Alvis Lemmings, RN, BSN   Authorized  by:   Jolaine Artist, MD, [facility]   Signed by:   Alvis Lemmings, RN, BSN on 03/18/2010   Method used:   Print then Give to Patient   RxID:   [id number]

## 2010-12-26 NOTE — Assessment & Plan Note (Signed)
Summary: per check out/sf   Visit Type:  Follow-up Primary Provider:  [name]  CC:  no complaints.  History of Present Illness: David Gregory is a 45 year old male with history of congestive heart failure due to nonischemic cardiomyopathy iniital EF 15-25%.  He also has a history of hypertension, diabetes, CRI, previous tobacco and alcohol use as well as medical noncompliance.   He was hospitalized in 12/10 with low output state and was found to have worsening of his LV function with an EF of 15%.  He required inotrope support. Since that time he has underwent placement of a BiVICD and been doing fairly well.   ACE-I dose limited by hyperkalemia. Also reason he is no longer spironolactone. Remains on hydralazine but nor on Imdur due to using Viagra.   Returns for f/u. At last visit had mild volume overload and diuretics increased for a few days. Feels fine - able to walk around [employer] without stopping. Weight up about 5 pounds again. At home weight typically 185-190 and he was 193 at home today. Takes occasional metoalzone as needed.  Denies CP or SOB. No LE edema. Had echo schedule for last month but was unable to make appt.   CPX (10/11): pVO2 12.6 (40%) Slope 51 RER 1.13 O2 pulse 53%   No ETOH or cigarettes since 12/10.    Current Medications (verified): 1)  Carvedilol 25 Mg Tabs (Carvedilol) .... Take One Tablet By Mouth Twice A Day 2)  Torsemide 20 Mg Tabs (Torsemide) .... Take One Tablet By Mouth Two Times A Day 3)  Lanoxin 0.25 Mg Tabs (Digoxin) .... Take 1 Tablet By Mouth Once A Day 4)  Janumet 50-1000 Mg Tabs (Sitagliptin-Metformin Hcl) .... Take One Tablet By Mouth Twice Daily. 5)  Glucosamine Sulfate   Powd (Glucosamine Sulfate) .... Two Times A Day 6)  Allopurinol 100 Mg Tabs (Allopurinol) .... 2 Tabs Once Daily 7)  Hydralazine Hcl 50 Mg Tabs (Hydralazine Hcl) .... Take One Tablet By Mouth Three Times A Day 8)  Accupril 5 Mg Tabs (Quinapril Hcl) .... Take 1 Tablet By Mouth Two  Times A Day 9)  Metolazone 2.5 Mg Tabs (Metolazone) .... Take One Tablet By Mouth Daily As Needed 10)  Potassium Chloride Crys Cr 20 Meq Cr-Tabs (Potassium Chloride Crys Cr) .... Take One Tablet By Mouth Daily As Directed 11)  Viagra 100 Mg Tabs (Sildenafil Citrate) .... Take One Tablet As Needed As Directed  Allergies (verified): No Known Drug Allergies  Past History:  Past Medical History: Last updated: 09/02/2010 CHF due to NICM    --normal cors cath 2006    --admission for low output HF - requiring milrinone 12/10    --last echo 12/10. EF 15-20%. Restrictive physiology    --s/p BiVICd    --CPX (10/10): pVO2 12.6 (40%) Slope 51 RER 1.13 O2 pulse 53%  HTN DM2 LBBB Gout h/o non-compliance h/o Tobacco & ETOH use - now quit  Review of Systems       Stable. No evidence of ischemia. Continue current regimen.   Vital Signs:  Patient profile:   45 year old male Height:      64 inches Weight:      198 pounds BMI:     34.11 Pulse rate:   90 / minute BP sitting:   108 / 70  (left arm) Cuff size:   regular  Vitals Entered By: Mignon Pine, RMA (December 05, 2010 9:40 AM)  Physical Exam  General:  Alert, No acute distress. Respirations  unlabored. HEENT: normal Neck: supple. JVP 9-10 Carotids 2+ bilat; no bruits. No lymphadenopathy or thryomegaly appreciated. Cor: PMI laterally displaced.Regular rate and rhythm. no s3  No rubs.  2/6 TR Lungs: clear Abdomen:No distended Non tender. Good bowel sounds. Extremities: no cyanosis, clubbing, rash. no edema Neuro: alert & orientedx3, cranial nerves grossly intact. moves all 4 extremities w/o difficulty. affect pleasant     ICD Specifications Following MD:  Thompson Grayer, MD     Referring MD:  Lovena Kluck ICD Vendor:  [employer]     ICD Model Number:  [id number]     ICD Serial Number:  [id number] ICD DOI:  11/20/2009     ICD Implanting MD:  Thompson Grayer, MD  Lead 1:    Location: RA     DOI: 11/20/2009     Model #: [id number]     Serial  #: [id number]     Status: active Lead 2:    Location: RV     DOI: 11/20/2009     Model #: [id number]     Serial #: [id number]     Status: active Lead 3:    Location: LV     DOI: 11/20/2009     Model #: [id number]     Serial #[id number]     Status: active  Indications::  CM   ICD Follow Up ICD Dependent:  No       ICD Device Measurements Configuration: LV TIP TO RV COIL  Episodes Coumadin:  No  Brady Parameters Mode DDD     Lower Rate Limit:  60     Upper Rate Limit 130 PAV 130     Sensed AV Delay:  100  Tachy Zones VF:  200     VT:  monitor 171-200     Impression & Recommendations:  Problem # 1:  SYSTOLIC HEART FAILURE, ACUTE ON CHRONIC (ICD-428.23) Symptomatically early NYHA III but CPX looks worse and renal function borderline. Continues with episodes of recurrent volume overload. Will give metolazone x 2 days then add back spiro 12.5. IfNeed to watch closely for recurrent hyperkalemia which has limited titration of ACE-I and sprio in past.If K climbing will stop spiro and increases demadex to 40/20. Long talk about timing of transplant work-up. Will proceed with RHC next week and if hemodynamics marginal will refer to [facility] for initial eval.   Orders: Cardiac Catheterization (Cardiac Cath) TLB-BMP (Basic Metabolic Panel-BMET) (99991111) TLB-CBC Platelet - w/Differential (85025-CBCD) TLB-PT (Protime) (85610-PTP)  Other Orders: EKG w/ Interpretation (93000) Primary Care Referral (Primary)  Patient Instructions: 1)  Take Metolazone today and tom. 2)  Start Spironolactone 25mg  1/2 tab daily 3)  Your physician recommends that you return for lab work in: [place] (BMET 428.22) 4)  Follow up in 1 month

## 2010-12-26 NOTE — Progress Notes (Signed)
Summary: rx refill  Phone Note Refill Request Call back at Home Phone [phone] Message from:  Patient on November 28, 2010 12:47 PM  Refills Requested: Medication #1:  LANOXIN 0.25 MG TABS Take 1 tablet by mouth once a day call pt when it ready to pick up   Method Requested: Pick up at Office Initial call taken by: Regan Lemming,  November 28, 2010 12:48 PM  Follow-up for Phone Call        spoke with Pt he actually needs his Janumet --- Dr Lovena Le gave it last time so will check with them.  Dr Haroldine Laws said he would give 30 days worth if he couldn't get it from PCP.Marland KitchenMarland KitchenHe agreed to this and wants it hand written, told him it would be ready on Monday. Follow-up by: Mignon Pine, RMA,  November 29, 2010 4:41 PM    Prescriptions: JANUMET 50-1000 MG TABS (SITAGLIPTIN-METFORMIN HCL) Take one tablet by mouth twice daily.  #30 x 1   Entered by:   Mignon Pine, RMA   Authorized by:   Jolaine Artist, MD, [facility]   Signed by:   Mignon Pine, RMA on 11/29/2010   Method used:   Print then Give to Patient   RxID:   [id number]

## 2010-12-26 NOTE — Assessment & Plan Note (Signed)
Summary: ROV OK PER HEATHER  Medications Added HYDRALAZINE HCL 50 MG TABS (HYDRALAZINE HCL) Take one tablet by mouth three times a day      Allergies Added: NKDA  Visit Type:  Follow-up Primary Provider:  [name]  CC:  no complaints.  History of Present Illness: David Gregory is a  45 year old male with history of congestive heart failure due to nonischemic cardiomyopathy inital EF 15-25%.  He also has a history of hypertension, diabetes, previous tobacco and alcohol use as well as medical noncompliance.   He was hospitalized in 12/10 of last year with low output state and was found to have worsening of his LV function with an EF of 15%.  He required intrope support. Since that time he has underwent placement of a BiVICD. Weight on d/c was 184 pounds.   Earlier this month had to start as needed metolazone for fluid overload. Also titrated hydralazine up. Weight very stable. Had not had to use metolazone again. Feels ok but still with Class III symptoms. Can go up steps but has to take breaks afterwards. No problems with his medicines. K+ was 5.0 at last visit.   No cigarettes x 3 months. No ETOH.   Current Medications (verified): 1)  Carvedilol 6.25 Mg Tabs (Carvedilol) .... Take One Tablet By Mouth Twice A Day 2)  Torsemide 20 Mg Tabs (Torsemide) .... Take One Tablet By Mouth Two Times A Day 3)  Digoxin 0.25 Mg Tabs (Digoxin) .... Take One Tablet By Mouth Daily 4)  Janumet 50-1000 Mg Tabs (Sitagliptin-Metformin Hcl) .... Take One Tablet By Mouth Twice Daily. 5)  Glucosamine Sulfate   Powd (Glucosamine Sulfate) .... Two Times A Day N 6)  Allopurinol 100 Mg Tabs (Allopurinol) .... 2 Tabs Once Daily 7)  Hydralazine Hcl 25 Mg Tabs (Hydralazine Hcl) .... Take 1 & 1/2  Tablet By Mouth Three Times A Day 8)  Lisinopril 5 Mg Tabs (Lisinopril) .... Take One Tablet By Mouth Daily 9)  Spironolactone 25 Mg Tabs (Spironolactone) .... Take One Tablet By Mouth Daily 10)  Metolazone 2.5 Mg Tabs  (Metolazone) .... Take One Tablet By Mouth Daily As Directed 11)  Potassium Chloride Crys Cr 20 Meq Cr-Tabs (Potassium Chloride Crys Cr) .... Take One Tablet By Mouth Daily As Directed  Allergies (verified): No Known Drug Allergies  Past History:  Past Medical History: Last updated: 01/02/2010 CHF due to NICM    --normal cors cath 2006    --last echo 2006. EF 15-20%   -- HTN DM2 Non-compliance Tobacco use ETOH use Gout LBBB  Past Surgical History: Last updated: 07/14/2009  PHYSICIAN:  Nelwyn Salisbury, M.D.  DATE OF BIRTH:  1966/10/16      DATE OF PROCEDURE:  02/21/2008   DATE OF DISCHARGE:  02/18/2008                                  OPERATIVE REPORT      PROCEDURE PERFORMED:  Screening colonoscopy.      PHYSICIAN:  Nelwyn Salisbury, M.D.  DATE OF BIRTH:  1966-07-17      DATE OF PROCEDURE:  02/17/2008   DATE OF DISCHARGE:                                  OPERATIVE REPORT      PROCEDURE PERFORMED:  Esophagogastroduodenoscopy with antral biopsies.  Family History: Last updated: 10/25/2009  Positive for hypertension and diabetes.   Social History: Last updated: 07/14/2009 The patient drinks occasional alcohol and sometimes in   significant amounts.  The patient smokes three cigarettes per day.   Works as a Dealer.  Denies illicit drug use.      Family History: Reviewed history from 10/25/2009 and no changes required.  Positive for hypertension and diabetes.   Social History: Reviewed history from 07/14/2009 and no changes required. The patient drinks occasional alcohol and sometimes in   significant amounts.  The patient smokes three cigarettes per day.   Works as a Dealer.  Denies illicit drug use.      Review of Systems       As per HPI and past medical history; otherwise all systems negative.   Vital Signs:  Patient profile:   45 year old male Height:      64 inches Weight:      191 pounds BMI:     32.90 Pulse rate:   89 / minute BP  sitting:   98 / 62  (left arm) Cuff size:   regular  Vitals Entered By: Mignon Pine, RMA (February 19, 2010 10:19 AM)  Physical Exam  General:  Looks ok. No acute distress. Respirations unlabored. HEENT: normal Neck: supple. JVP flat. Carotids 2+ bilat; no bruits. No lymphadenopathy or thryomegaly appreciated. Cor: PMI laterally displaced.Tachycardic and regular.+ soft s3 No rubs.  2/6 TR Lungs: clear Abdomen:No distended Non tender. Good bowel sounds. Extremities: no cyanosis, clubbing, rash. no edema Neuro: alert & orientedx3, cranial nerves grossly intact. moves all 4 extremities w/o difficulty. affect pleasant     ICD Specifications Following MD:  Thompson Grayer, MD     Referring MD:  [facility] ICD Vendor:  [employer]onic     ICD Model Number:  [id number]     ICD Serial Number:  [id number] ICD DOI:  11/20/2009     ICD Implanting MD:  Thompson Grayer, MD  Lead 1:    Location: RA     DOI: 11/20/2009     Model #: [id number]     Serial #: [id number]     Status: active Lead 2:    Location: RV     DOI: 11/20/2009     Model #: [id number]     Serial #: [id number]     Status: active Lead 3:    Location: LV     DOI: 11/20/2009     Model #: [id number]     Serial #: [id number]     Status: active  Indications::  CM   ICD Follow Up ICD Dependent:  No       ICD Device Measurements Configuration: LV TIP TO RV COIL  Brady Parameters Mode DDD     Lower Rate Limit:  60     Upper Rate Limit 130 PAV 130     Sensed AV Delay:  100  Tachy Zones VF:  200     Impression & Recommendations:  Problem # 1:  SYSTOLIC HEART FAILURE, CHRONIC (ICD-428.22) Volume status looks good. Much more stable but still with NYHA III. Increase hydralazine to 50 three times a day. Recheck electrolytes today. Monthly cotinine levels. See back in 3-4 weeks.   Problem # 2:  TOBACCO ABUSE (ICD-305.1) Congratulated him on remaining quit.   Other Orders: EKG w/ Interpretation (93000) TLB-BMP (Basic Metabolic Panel-BMET)  (99991111)  Patient Instructions: 1)  Increase Hyrdalazine to 50mg  three times a day (2 tabs three times a  day) 2)  Labs today--BMET 3)  Labs when you return in 3-4 weeks--bmet, bnp, cotinine 428.22 4)  Follow up in 3-4 weeks Prescriptions: HYDRALAZINE HCL 50 MG TABS (HYDRALAZINE HCL) Take one tablet by mouth three times a day  #90 x 3   Entered by:   [name], RN   Authorized by:   Jolaine Artist, MD, [facility]   Signed by:   [name], RN on 02/19/2010   Method used:   Historical   [id number]

## 2010-12-26 NOTE — Progress Notes (Signed)
Summary: blood work   Phone Note Call from Patient Call back at Work Phone [phone]   Caller: Patient Reason for Call: Talk to Nurse Summary of Call: wants to make sure the nurse received bld wok he faxed Initial call taken by: [name],  September 14, 2009 10:45 AM  Follow-up for Phone Call        Left message to call back [name], RN  September 14, 2009 11:23 AM  pt not at work, Left message to call back at home # [name], RN  September 17, 2009 1:33 PM   spoke w/pt his edema is better, sch appt w/DB for 11/1 at [place], RN  September 17, 2009 4:47 PM

## 2010-12-26 NOTE — Letter (Signed)
Summary: Generic Letter  Press photographer, [place]  [address] [place]   [place], [place] [address]   Phone: [phone]  Fax: [phone]        November 12, 2009 MRN: [id number]    [facility] [address] [place], [place]  [address]   To Whom It May Concern,   [name] is a 45 year old man whom I follow in our Cardiology clinic for severe biventricular heart failure. He was recently admitted for two weeks for his heart failure and required IV inotropic support. Given the severity of his heart disease, he will not be able to work for the forseeable future and we may even need to consider heart transplantation. I have thus asked him to apply for disability and Medicaid. I appreciate your consideration in this matter. Please do not heistate to contact me with questions.  Sincerely,  Jolaine Artist, MD co-director, Congestive Heart Failure and Pulmonary Hypertension Programs [facility] [phone]  This letter has been electronically signed by your physician.

## 2010-12-26 NOTE — Letter (Signed)
Summary: Generic Letter  Press photographer, [place]  [address] [place]   [place], [place] [address]   Phone: [phone]  Fax: [phone]        November 12, 2009 MRN: [id number]    [facility] [address] [place], [place]  [address]    Dear Mr. Enzor,         Sincerely,  Jolaine Artist, MD, [facility]  This letter has been electronically signed by your physician.

## 2010-12-26 NOTE — Progress Notes (Signed)
Summary: SOB  Medications Added METOLAZONE 2.5 MG TABS (METOLAZONE) Take one tablet by mouth daily as directed POTASSIUM CHLORIDE CRYS CR 20 MEQ CR-TABS (POTASSIUM CHLORIDE CRYS CR) Take one tablet by mouth daily as directed       Phone Note Call from Patient Call back at Home Phone [phone]   Caller: Patient Reason for Call: Talk to Nurse Summary of Call: pt is SOB, going on for the last 3 days Initial call taken by: [name],  January 22, 2010 9:17 AM  Follow-up for Phone Call        Spoke with pt. Patient c/o that he is retaining fluids. He is coughing a lot. coughs white thick mucous. Pt states is unable to lay down in bed . He sleeps sitting up on a chair. This has been going on for the last 4 days. According to pt.  has gain 5 to 7 lbs in a week. Pt. states he feels the fluid is mostly in his lungs. His legs have some edema. Carollee Sires, RN, BSN  January 22, 2010 9:38 AM   Additional Follow-up for Phone Call Additional follow up Details #1::        will add metolazone 2.5 mg per day for next 3 days with kcl 20. bring in thursday for labs and possbible visit with me. come to ER if not getting better. Jolaine Artist, MD, [facility]  January 22, 2010 11:10 AM      Additional Follow-up for Phone Call Additional follow up Details #2::    pt is aware, will take for 3 days, pt sch to see Dr Sanda Linger 3/3 at 4:15, rx sent into [place], RN  January 22, 2010 12:42 PM   New/Updated Medications: METOLAZONE 2.5 MG TABS (METOLAZONE) Take one tablet by mouth daily as directed POTASSIUM CHLORIDE CRYS CR 20 MEQ CR-TABS (POTASSIUM CHLORIDE CRYS CR) Take one tablet by mouth daily as directed Prescriptions: POTASSIUM CHLORIDE CRYS CR 20 MEQ CR-TABS (POTASSIUM CHLORIDE CRYS CR) Take one tablet by mouth daily as directed  #30 x 3   Entered by:   [name], RN   Authorized by:   Jolaine Artist, MD, [facility]   Signed by:   [name], RN on 01/22/2010   Method  used:   Electronically to        [facility] (retail)       [place], [place]  [address]       Ph: [phone]       Fax: [phone]   RxID:   [id number] METOLAZONE 2.5 MG TABS (METOLAZONE) Take one tablet by mouth daily as directed  #5 x 3   Entered by:   [name], RN   Authorized by:   Jolaine Artist, MD, [facility]   Signed by:   [name], RN on 01/22/2010   Method used:   Electronically to        [facility] (retail)       [address]       [place], [place]  [address]       Ph: [phone]       Fax: [phone]   RxID:   [id number]

## 2010-12-26 NOTE — Assessment & Plan Note (Signed)
Summary: rov      Allergies Added: NKDA  Visit Type:  Follow-up Primary Provider:  [name]   History of Present Illness: David Gregory is a  45 year old male with history of congestive heart failure due to nonischemic cardiomyopathy iniital EF 15-25%.  He also has a history of hypertension, diabetes, previous tobacco and alcohol use as well as medical noncompliance.   He was hospitalized in 12/10 of last year with low output state and was found to have worsening of his LV function with an EF of 15%.  He required inotrope support. Since that time he has underwent placement of a BiVICD. Weight on d/c was 184 pounds.   Over past few weeks we have been adjusting diuretics as he had some renal insufficiency. Initially demadex cut back to once a day but then fluid came back so now back to two times a day. Only taking metolazone as needed. Now feels good that the fluid is off. No current orthopnea or PND. NYHA II-III. feels his eyes are sun senstitive. Compliant with meds.  No ETOH or cigarettes since 12/10.    Current Medications (verified): 1)  Carvedilol 6.25 Mg Tabs (Carvedilol) .... Take 1 & 1/2 Tablet By Mouth Twice A Day 2)  Torsemide 20 Mg Tabs (Torsemide) .... Take One Tablet By Mouth Two Times A Day 3)  Lanoxin 0.25 Mg Tabs (Digoxin) .... Take 1 Tablet By Mouth Once A Day 4)  Janumet 50-1000 Mg Tabs (Sitagliptin-Metformin Hcl) .... Take One Tablet By Mouth Twice Daily. 5)  Glucosamine Sulfate   Powd (Glucosamine Sulfate) .... Two Times A Day 6)  Allopurinol 100 Mg Tabs (Allopurinol) .... 2 Tabs Once Daily 7)  Hydralazine Hcl 50 Mg Tabs (Hydralazine Hcl) .... Take One Tablet By Mouth Three Times A Day 8)  Accupril 5 Mg Tabs (Quinapril Hcl) .... Take 1 Tablet By Mouth Two Times A Day 9)  Spironolactone 25 Mg Tabs (Spironolactone) .... Take One Tablet By Mouth Daily 10)  Metolazone 2.5 Mg Tabs (Metolazone) .... Take One Tablet By Mouth Daily As Needed 11)  Potassium Chloride Crys Cr  20 Meq Cr-Tabs (Potassium Chloride Crys Cr) .... Take One Tablet By Mouth Daily As Directed  Allergies (verified): No Known Drug Allergies  Past History:  Past Medical History: Last updated: 03/18/2010 CHF due to NICM    --normal cors cath 2006    --admission for low output HF - requiring milrinone 12/10    --last echo 12/10. EF 15-20%. Restrictive physiology    --s/p BiVICd HTN DM2 LBBB Gout h/o non-compliance h/o Tobacco & ETOH use - now quit  Review of Systems       As per HPI and past medical history; otherwise all systems negative.   Vital Signs:  Patient profile:   45 year old male Weight:      184 pounds Pulse rate:   78 / minute BP sitting:   98 / 64  (right arm)  Vitals Entered By: Margaretmary Bayley CMA (July 03, 2010 11:55 AM)  Physical Exam  General:  Alert, No acute distress. Respirations unlabored. HEENT: normal Neck: supple. JVP flat Carotids 2+ bilat; no bruits. No lymphadenopathy or thryomegaly appreciated. Cor: PMI laterally displaced.Regular rate and rhtyhm. +s3  No rubs.  2/6 TR Lungs: clear Abdomen:No distended Non tender. Good bowel sounds. Extremities: no cyanosis, clubbing, rash. no edema Neuro: alert & orientedx3, cranial nerves grossly intact. moves all 4 extremities w/o difficulty. affect pleasant     ICD Specifications Following MD:  Thompson Grayer, MD     Referring MD:  BENSIMHON ICD Vendor:  [employer]     ICD Model Number:  [id number]     ICD Serial Number:  [id number] ICD DOI:  11/20/2009     ICD Implanting MD:  Thompson Grayer, MD  Lead 1:    Location: RA     DOI: 11/20/2009     Model #: [id number]     Serial #: [id number]     Status: active Lead 2:    Location: RV     DOI: 11/20/2009     Model #: [id number]     Serial #: [id number]     Status: active Lead 3:    Location: LV     DOI: 11/20/2009     Model #: [id number]     Serial #: [id number]     Status: active  Indications::  CM   ICD Follow Up ICD Dependent:  No       ICD Device  Measurements Configuration: LV TIP TO RV COIL  Brady Parameters Mode DDD     Lower Rate Limit:  60     Upper Rate Limit 130 PAV 130     Sensed AV Delay:  100  Tachy Zones VF:  200     VT:  monitor 171-200     Impression & Recommendations:  Problem # 1:  SYSTOLIC HEART FAILURE, CHRONIC (ICD-428.22) Volume status looks good but BUN/Cr remain elevated. Suspect worsensing cardiorenal syndrome due to severe heart failure. Will need right heart cat to evaluate filling pressures and outputs. May need intotropes.   Other Orders: TLB-BMP (Basic Metabolic Panel-BMET) (99991111) TLB-BNP (B-Natriuretic Peptide) (83880-BNPR)  Patient Instructions: 1)  Labs today 2)  Follow up in 83m onth

## 2010-12-26 NOTE — Letter (Signed)
Summary: ROI  ROI   Imported By: Tobin Chad 07/30/2010 11:32:06  _____________________________________________________________________  External Attachment:    Type:   Image     Comment:   External Document

## 2010-12-26 NOTE — Miscellaneous (Signed)
Summary: MAP program  Received forms from Medication Assistance Program through [place], due to their availability and cost they request lisinopril be changed to accupril and digoxin be changed to lanoxin, the changes were made at pts appt on 05/30/10, and accurpil was increased from 5mg  once daily to two times a day, the new prescriptions and the form was faxed back to MAP program at [place], RN  May 30, 2010 1:19 PM  Clinical Lists Changes

## 2010-12-26 NOTE — Progress Notes (Signed)
   Recieved Request from [name] sent to [facility]. [name]  May 13, 2010 9:56 AM

## 2010-12-26 NOTE — Procedures (Signed)
Summary: Cardiology Device Clinic   Current Medications (verified): 1)  Carvedilol 25 Mg Tabs (Carvedilol) .... Take One Tablet By Mouth Twice A Day 2)  Torsemide 20 Mg Tabs (Torsemide) .... Take One Tablet By Mouth Two Times A Day 3)  Lanoxin 0.25 Mg Tabs (Digoxin) .... Take 1 Tablet By Mouth Once A Day 4)  Janumet 50-1000 Mg Tabs (Sitagliptin-Metformin Hcl) .... Take One Tablet By Mouth Twice Daily. 5)  Glucosamine Sulfate   Powd (Glucosamine Sulfate) .... Two Times A Day 6)  Allopurinol 100 Mg Tabs (Allopurinol) .... 2 Tabs Once Daily 7)  Hydralazine Hcl 50 Mg Tabs (Hydralazine Hcl) .... Take One Tablet By Mouth Three Times A Day 8)  Accupril 5 Mg Tabs (Quinapril Hcl) .... Take 1 Tablet By Mouth Two Times A Day 9)  Metolazone 2.5 Mg Tabs (Metolazone) .... Take One Tablet By Mouth Daily As Needed 10)  Potassium Chloride Crys Cr 20 Meq Cr-Tabs (Potassium Chloride Crys Cr) .... Take One Tablet By Mouth Daily As Directed 11)  Viagra 100 Mg Tabs (Sildenafil Citrate) .... Take One Tablet As Needed As Directed  Allergies (verified): No Known Drug Allergies   ICD Specifications Following MD:  Thompson Grayer, MD     Referring MD:  BENSIMHON ICD Vendor:  [employer]     ICD Model Number:  [id number]     ICD Serial Number:  [id number] ICD DOI:  11/20/2009     ICD Implanting MD:  Thompson Grayer, MD  Lead 1:    Location: RA     DOI: 11/20/2009     Model #: [id number]     Serial #: [id number]     Status: active Lead 2:    Location: RV     DOI: 11/20/2009     Model #: [id number]     Serial #: [id number]     Status: active Lead 3:    Location: LV     DOI: 11/20/2009     Model #: [id number]     Serial #: [id number]     Status: active  Indications::  CM   ICD Follow Up Battery Voltage:  3.15 V     Charge Time:  9.2 seconds     Underlying rhythm:  SR ICD Dependent:  No       ICD Device Measurements Atrium:  Amplitude: 2.8 mV, Impedance: 437 ohms, Threshold: 0.75 V at 0.40 msec Right Ventricle:  Amplitude: 6.9 mV,  Impedance: 342 ohms, Threshold: 1.00 V at 0.40 msec Left Ventricle:  Impedance: 437 ohms, Threshold: 0.50 V at 0.40 msec Configuration: LV TIP TO RV COIL Shock Impedance: 34/44 ohms   Episodes MS Episodes:  0     Coumadin:  No Shock:  0     ATP:  0     Nonsustained:  5     Atrial Pacing:  11.2%     Ventricular Pacing:  99.7%  Brady Parameters Mode DDD     Lower Rate Limit:  60     Upper Rate Limit 130 PAV 130     Sensed AV Delay:  100  Tachy Zones VF:  200     VT:  monitor 171-200     Next Cardiology Appt Due:  12/25/2010 Tech Comments:  5 NST VT EPISODES.  OPTIVOL INCREASE FROM 09-15-10 AND ONGOING. NORMAL DEVICE FUNCTION.  NO CHANGES MADE. ROV IN 3 MTHS W/DEVICE CLINIC.  PT IS NOT INTERESTED IN [place] PREFERS OV.  Shelly Bombard  October 16, 2010 10:46 AM

## 2010-12-26 NOTE — Progress Notes (Signed)
Summary: scheduling appts   Phone Note Call from Patient Call back at Home Phone [phone]   Caller: Patient Reason for Call: Talk to Nurse Summary of Call: request to speak to nurse about scheduling appts Initial call taken by: [name],  January 24, 2010 10:15 AM  Follow-up for Phone Call        spoke w/pt will discuss disability w/Dr Keziyah Kneale at appt today [name], RN  January 24, 2010 10:41 AM

## 2010-12-26 NOTE — Cardiovascular Report (Signed)
Summary: Heart Failure Management   Heart Failure Management   Imported By: Sallee Provencal 07/10/2010 10:46:52  _____________________________________________________________________  External Attachment:    Type:   Image     Comment:   External Document

## 2010-12-26 NOTE — Letter (Signed)
Summary: [place] Program   Imported By: Marilynne Drivers 06/18/2010 13:47:18  _____________________________________________________________________  External Attachment:    Type:   Image     Comment:   External Document

## 2010-12-26 NOTE — Progress Notes (Signed)
   Recieved Request form DDS forwarded to [facility] for processing. David Gregory  November 21, 2009 9:35 AM    Appended Document:  Request for Records form DDS forwarded to [facility]

## 2010-12-26 NOTE — Letter (Signed)
Summary: Generic Letter  Press photographer, [place]  [address] [place]   [place], [place] [address]   Phone: [phone]  Fax: [phone]        November 12, 2009 MRN: [id number]    [facility] [address] [place], [place]  [address]    Dear [name],         Sincerely,  Jolaine Artist, MD, [facility]  This letter has been electronically signed by your physician.

## 2010-12-26 NOTE — Progress Notes (Signed)
Summary: needs letter   Phone Note Call from Patient Call back at Home Phone [phone]   Caller: Patient Reason for Call: Talk to Nurse Summary of Call: request to speak to nurse, needs a letter from the dr Initial call taken by: [name],  March 21, 2010 11:02 AM  Follow-up for Phone Call        need letter for lawyer stating pts condition, like his letter from 12/20, will have Dr Haroldine Laws do letter, will call pt when ready for pick up David Rosebush, RN  March 21, 2010 2:52 PM  Pt calling to check on a letter David Gregory  Mar 27, 2010 11:12 AM   per pt calling back to check on status of letter for  - his lawyer [place]  Mar 28, 2010 10:36 AM   Additional Follow-up for Phone Call Additional follow up Details #1::        letter completed pt aware to pick up on Mon Heather Schub, RN  Mar 29, 2010 5:29 PM

## 2010-12-26 NOTE — Progress Notes (Signed)
    Records [facility] sent to [place]  July 19, 2010 2:56 PM

## 2010-12-26 NOTE — Letter (Signed)
Summary: Device-Delinquent Check  [place], [place] [address] [place]   [place], [place] [address]   Phone: [phone]  Fax: [phone]     October 10, 2010 MRN: [id number]   [facility] [address] [place], [place]  [address]   Dear Mr. Chill,  According to our records, you have not had your implanted device checked in the recommended period of time.  We are unable to determine appropriate device function without checking your device on a regular basis.  Please call our office to schedule an appointment, with Dr Rayann Heman,  as soon as possible.  If you are having your device checked by another physician, please call us so that we may update our records.  Thank you,  Gasper Sells, EMT  October 10, 2010 10:57 AM  [facility]

## 2010-12-26 NOTE — Progress Notes (Signed)
   Recieved a request for Medical Records from [place] will forward to [facility]   [facility]  May 22, 2009 8:12 AM

## 2010-12-26 NOTE — Assessment & Plan Note (Signed)
Summary: eph-discuss ICD placement      Medications Added CARVEDILOL 3.125 MG TABS (CARVEDILOL) Take one tablet by mouth twice a day TORSEMIDE 20 MG TABS (TORSEMIDE) Take one tablet by mouth two times a day JANUMET 50-1000 MG TABS (SITAGLIPTIN-METFORMIN HCL) Take one tablet by mouth twice daily. HYDRALAZINE HCL 25 MG TABS (HYDRALAZINE HCL) Take 1 1/2 tablet by mouth three times a day ISOSORBIDE MONONITRATE CR 30 MG XR24H-TAB (ISOSORBIDE MONONITRATE) Take one tablet by mouth daily LISINOPRIL 5 MG TABS (LISINOPRIL) Take one tablet by mouth daily MAG-OX 400 400 MG TABS (MAGNESIUM OXIDE) one tablet three times a day SPIRONOLACTONE 25 MG TABS (SPIRONOLACTONE) Take one tablet by mouth daily      Allergies Added: NKDA  Visit Type:  Follow-up Primary David Gregory:  David Gregory   History of Present Illness: [name] is a  45 year old male with history of congestive heart failure due to nonischemic cardiomyopathy inital EF 15-25%. On bedside relook 40-45% in 2009.  He also has a history of hypertension, diabetes, ongoing tobacco and alcohol use as well as medical noncompliance.   He was hospitalized several weeks ago and was found to have worsening of his LV function with an EF of 15%.  He has LBBB.  He has not had syncope.  He denies peripheral edema.  No other complaints.  I discussed the possibility of ICD implant but thought it would be best if he was discharged for a couple of weeks.  Current Medications (verified): 1)  Carvedilol 3.125 Mg Tabs (Carvedilol) .... Take One Tablet By Mouth Twice A Day 2)  Torsemide 20 Mg Tabs (Torsemide) .... Take One Tablet By Mouth Two Times A Day 3)  Digoxin 0.25 Mg Tabs (Digoxin) .... Take One Tablet By Mouth Daily 4)  Janumet 50-1000 Mg Tabs (Sitagliptin-Metformin Hcl) .... Take One Tablet By Mouth Twice Daily. 5)  Glucosamine Sulfate   Powd (Glucosamine Sulfate) .... Two Times A Day N 6)  Allopurinol 100 Mg Tabs (Allopurinol) .... 2 Tabs Once Daily 7)   Hydralazine Hcl 25 Mg Tabs (Hydralazine Hcl) .... Take 1 1/2 Tablet By Mouth Three Times A Day 8)  Isosorbide Mononitrate Cr 30 Mg Xr24h-Tab (Isosorbide Mononitrate) .... Take One Tablet By Mouth Daily 9)  Lisinopril 5 Mg Tabs (Lisinopril) .... Take One Tablet By Mouth Daily 10)  Mag-Ox 400 400 Mg Tabs (Magnesium Oxide) .... One Tablet Three Times A Day 11)  Spironolactone 25 Mg Tabs (Spironolactone) .... Take One Tablet By Mouth Daily  Allergies (verified): No Known Drug Allergies  Past History:  Past Medical History: Last updated: 09/24/2009 CHF due to NICM    --normal cors cath 2006    --last echo 2006. EF 15-20% HTN DM2 Non-compliance Tobacco use ETOH use Gout LBBB  Past Surgical History: Last updated: 07/14/2009  PHYSICIAN:  Nelwyn Salisbury, M.D.  DATE OF BIRTH:  1966/10/11      DATE OF PROCEDURE:  02/21/2008   DATE OF DISCHARGE:  02/18/2008                                  OPERATIVE REPORT      PROCEDURE PERFORMED:  Screening colonoscopy.      PHYSICIAN:  Nelwyn Salisbury, M.D.  DATE OF BIRTH:  1966-06-21      DATE OF PROCEDURE:  02/17/2008   DATE OF DISCHARGE:  OPERATIVE REPORT      PROCEDURE PERFORMED:  Esophagogastroduodenoscopy with antral biopsies.      Review of Systems  The patient denies chest pain and peripheral edema.    Vital Signs:  Patient profile:   45 year old male Height:      64 inches Weight:      185 pounds Pulse rate:   64 / minute BP sitting:   102 / 80  (left arm)  Vitals Entered By: Margaretmary Bayley CMA (November 14, 2009 4:15 PM)  Physical Exam  General:  Walks slowly. + SOB HEENT: normal Neck: supple. JVP to forehead. Carotids 2+ bilat; no bruits. No lymphadenopathy or thryomegaly appreciated. Cor: PMI laterally displaced.Tachycardic and regular. loud s3 No rubs. + s3. 2/6 TR Lungs: clear Abdomen: markedly distended. Non tender. liver edge downGood bowel sounds. Extremities: no cyanosis,  clubbing, rash. 4+ edema Neuro: alert & orientedx3, cranial nerves grossly intact. moves all 4 extremities w/o difficulty. affect pleasant    Impression & Recommendations:  Problem # 1:  SYSTOLIC HEART FAILURE, ACUTE ON CHRONIC (ICD-428.23) I have discussed the treatment options with options with the patient.  The risks/benefits/goals/expectations of ICD insertion have been discussed with the patient and he wishes to proceed.  Because of difficulty with scheduling, I will arrange for this to be done with Dr. Rayann Heman next week. The following medications were removed from the medication list:    Enalapril Maleate 10 Mg Tabs (Enalapril maleate) .David Gregory... Take one tablet by mouth once daily    Carvedilol 25 Mg Tabs (Carvedilol) .David Gregory... Take one tablet by mouth once daily His updated medication list for this problem includes:    Carvedilol 3.125 Mg Tabs (Carvedilol) .David Gregory... Take one tablet by mouth twice a day    Torsemide 20 Mg Tabs (Torsemide) .David Gregory... Take one tablet by mouth two times a day    Digoxin 0.25 Mg Tabs (Digoxin) .David Gregory... Take one tablet by mouth daily    Isosorbide Mononitrate Cr 30 Mg Xr24h-tab (Isosorbide mononitrate) .David Gregory... Take one tablet by mouth daily    Lisinopril 5 Mg Tabs (Lisinopril) .David Gregory... Take one tablet by mouth daily    Spironolactone 25 Mg Tabs (Spironolactone) .David Gregory... Take one tablet by mouth daily

## 2010-12-26 NOTE — Miscellaneous (Signed)
Summary: referral to [facility]  pts records including last ov, hosp. d/c and right heart caths from 1/18 and 1/23 [name], RN  December 20, 2010 4:03 PM   Clinical Lists Changes

## 2010-12-26 NOTE — Assessment & Plan Note (Signed)
Summary: 9:30  rov  Medications Added FUROSEMIDE 80 MG TABS (FUROSEMIDE) Take one tablet by mouth in am 1/2 tablet by mouth  in pm ENALAPRIL MALEATE 10 MG TABS (ENALAPRIL MALEATE) Take one tablet by mouth once daily COLCHICINE 0.6 MG TABS (COLCHICINE) prn INDOMETHACIN 50 MG CAPS (INDOMETHACIN) as needed JANUMET 50-1000 MG TABS (SITAGLIPTIN-METFORMIN HCL) Take one tablet by mouth twice daily. CARVEDILOL 25 MG TABS (CARVEDILOL) Take one tablet by mouth once daily GLUCOSAMINE SULFATE   POWD (GLUCOSAMINE SULFATE) two times a day n NAPROXEN DR 500 MG TBEC (NAPROXEN) as needed VIAGRA 100 MG TABS (SILDENAFIL CITRATE) take 1/2 tablet by mouth as needed      Allergies Added: NKDA  Visit Type:  Follow-up Primary Provider:  [name]   History of Present Illness: [name] is a  45 year old male with history of congestive heart failure due to nonischemic cardiomyopathy inital EF 15-25%. On bedside relook 40-45% in 2009.  He also has a history of hypertension, diabetes, ongoing tobacco and alcohol use as well as medical noncompliance. He returns for his first visit in a year and a half becaquse he wants to discuss erectile dysfunction.  Says he has been taking his medication regularly but not taking the as needed zaroxolyn. Foillowing with Dr. Criss Rosales. Over past month notes that swelling has been worse especially at night. Takes lasix 80 mg qd. Has stated taking zaroxolyn 2x/day and swelling gone. Main concern is erectile dysfunction. No orthopnea or PND. Only SOB when has swelling. Smoking 2 cigs/day. Stopped drinking beer over last few days. Says he walks without difficulty. No CP at all. Chronic R knee pain. No palipitations, syncope or presyncope. No focal neuro deficits.      Current Medications (verified): 1)  Carvedilol 12.5 Mg Tabs (Carvedilol) .... Take One Tablet By Mouth Twice A Day 2)  Furosemide 80 Mg Tabs (Furosemide) .... Take One Tablet By Mouth Daily. 3)  Enalapril Maleate 10 Mg  Tabs (Enalapril Maleate) .... Take One Tablet By Mouth Once Daily 4)  Digoxin 0.25 Mg Tabs (Digoxin) .... Take One Tablet By Mouth Daily 5)  Zaroxolyn 2.5 Mg Tabs (Metolazone) .... Take One Tablet By Mouth Twice Daily. 6)  Colchicine 0.6 Mg Tabs (Colchicine) .... Prn 7)  Indomethacin 50 Mg Caps (Indomethacin) .... As Needed 8)  Janumet 50-1000 Mg Tabs (Sitagliptin-Metformin Hcl) .... Take One Tablet By Mouth Twice Daily. 9)  Carvedilol 25 Mg Tabs (Carvedilol) .... Take One Tablet By Mouth Once Daily 10)  Glucosamine Sulfate   Powd (Glucosamine Sulfate) .... Two Times A Day N 11)  Naproxen Dr 500 Mg Tbec (Naproxen) .... As Needed  Allergies (verified): No Known Drug Allergies  Past History:  Past Medical History: CHF due to NICM    --normal cors cath 2006    --last echo 2006. EF 15-20% HTN DM2 Non-compliance Tobacco use ETOH use Gout LBBB  Review of Systems       As per HPI and past medical history; otherwise all systems negative.   Vital Signs:  Patient profile:   45 year old male Height:      64 inches Weight:      201.8 pounds BMI:     34.76 Pulse rate:   96 / minute BP sitting:   118 / 90  (left arm)  Vitals Entered By: Margaretmary Bayley CMA (September 24, 2009 10:01 AM)  Physical Exam  General:  [id number] slowly with limp. no resp difficulty HEENT: normal Neck: supple. no JVD. Carotids 2+  bilat; no bruits. No lymphadenopathy or thryomegaly appreciated. Cor: PMI laterally displaced. Regular rate & rhythm. No rubs. + wide split s2. + s3. 2/6 TR Lungs: clear Abdomen: soft, nontender, nondistended. No hepatosplenomegaly. No bruits or masses. Good bowel sounds. Extremities: no cyanosis, clubbing, rash, edema Neuro: alert & orientedx3, cranial nerves grossly intact. moves all 4 extremities w/o difficulty. affect pleasant    Impression & Recommendations:  Problem # 1:  SYSTOLIC HEART FAILURE, CHRONIC (ICD-428.22) I had long discussion with him about need for  improved complaince.  I am very concerned that he is having progressive HF and will need close f/u including possible ICD amd CPX testing. He is hesitant to f/u on regular basis due to cost. But I explained that it is likely matter of life and death.   Volume status currently looks better after metolazone but would like to avoid chronic metolazone if possible. Will increase lasix to 80/40. Check CMET, BNP and uric acid. Would like to titrate ACE-I in near future once I see labs. Continue b-block and digoxin. Check echo. Will see back in 1 month as he is hesitant to come back sooner.  Problem # 2:  TOBACCO ABUSE (ICD-305.1) Counseled on need to quit.  Problem # 3:  GOUT, UNSPECIFIED (ICD-274.9) Told him to avoid indomethacin at all costs due to risk of worsening HF and renal insufficiency. We will check uric acid today. If elevated will need allopurinol.   Problem # 4:  ERECTILE DYSFUNCTION, ORGANIC (ICD-607.84) Will give him prescription fo viagra 100 mg. Will only give 10 doses to make sure he comes back. Counseled on need to avoid all nitrates.  Other Orders: EKG w/ Interpretation (93000) Echocardiogram (Echo) TLB-BMP (Basic Metabolic Panel-BMET) (99991111) TLB-BNP (B-Natriuretic Peptide) (83880-BNPR) TLB-Hepatic/Liver Function Pnl (80076-HEPATIC) TLB-Uric Acid, Blood (84550-URIC)  Patient Instructions: 1)  Your physician recommends that you schedule a follow-up appointment in: 1 month. 2)  Your physician has requested that you have an echocardiogram.  Echocardiography is a painless test that uses sound waves to create images of your heart. It provides your doctor with information about the size and shape of your heart and how well your heart's chambers and valves are working.  This procedure takes approximately one hour. There are no restrictions for this procedure. 3)  Your physician recommends that you continue on your current medications as directed. Please refer to the Current  Medication list given to you today. take Lasix 80 mg one tablet by mouth in the morning . Take 1/2 tanley40 mg) by mouth in PM. Prescriptions: VIAGRA 100 MG TABS (SILDENAFIL CITRATE) take 1/2 tablet by mouth as needed  #10 x 0   Entered by:   Carollee Sires, RN, BSN   Authorized by:   Jolaine Artist, MD, [facility]   Signed by:   Carollee Sires, RN, BSN on 09/24/2009   Method used:   Electronically to        [facility] (retail)       [address]       [place], [place]  [address]       Ph: [phone]       Fax: [phone]   RxIDSP:[id number] FUROSEMIDE 80 MG TABS (FUROSEMIDE) Take one tablet by mouth in am 1/2 tablet by mouth  in pm  #45 x 6   Entered by:   Carollee Sires, RN, BSN   Authorized by:   Jolaine Artist, MD,  [facility]   Signed by:   Carollee Sires, RN, BSN on 09/24/2009   Method used:   Electronically to        [facility] (retail)       [address]       [place], [place]  [address]       Ph: [phone]       Fax: [phone]   RxID:   [id number]

## 2010-12-26 NOTE — Cardiovascular Report (Signed)
Summary: Pre-Cath Orders  Pre-Cath Orders   Imported By: Marilynne Drivers 12/11/2010 18:30:42  _____________________________________________________________________  External Attachment:    Type:   Image     Comment:   External Document

## 2010-12-26 NOTE — Letter (Signed)
Summary: Generic Letter  Press photographer, [place]  [address] [place]   [place], [place] [address]   Phone: [phone]  Fax: [phone]        November 12, 2009 MRN: [id number]    [facility] [address] [place], [place]  [address]    Dear Mr. Myung,         Sincerely,  Jolaine Artist, MD, [facility]  This letter has been electronically signed by your physician.

## 2010-12-26 NOTE — Progress Notes (Signed)
Summary: fluid retention   Phone Note Other Incoming   Summary of Call: pt in for labwork today and reports he is retaining fluid, his wt is up 10 lbs since Sun and he is having to sleep sitting up, per Dr Vaughan Browner increase torsemide back to two times a day, and take metolazone for 2 days, instructions given to pt  Initial call taken by: [name], RN,  June 26, 2010 10:50 AM      Patient Instructions: 1)  Increase Torsemide back to two times a day  2)  Take Metolazone today and tomorrow then stop 3)  Keep appt next Wed 8/10 at 11:45

## 2010-12-26 NOTE — Letter (Signed)
Summary: Heart Failure Management  Heart Failure Management   Imported By: Marilynne Drivers 12/11/2010 18:25:05  _____________________________________________________________________  External Attachment:    Type:   Image     Comment:   External Document

## 2010-12-26 NOTE — Progress Notes (Signed)
Summary: needs letter   Phone Note Call from Patient   Caller: Patient [phone] Reason for Call: Talk to Nurse Summary of Call: pt calling to get letter-stating his health condition-has hearing with medicaid-has appt here 9-6, would like to pick up [phone] Initial call taken by: Lorenda Hatchet,  July 25, 2010 10:13 AM  Follow-up for Phone Call        spoke w/pt he has a hearing coming up and needs a detailed letter stating his medical condition and why he needs disability, he will pick up at his appt on 9/6, Dr Vaughan Browner is aware and willd o a letter.  Pt also ask to get some of his medical records, will get together for him to pick up on 9/6 Heather Schub, RN  July 25, 2010 11:03 AM

## 2010-12-26 NOTE — Cardiovascular Report (Signed)
Summary: Office Visit   Office Visit   Imported By: Sallee Provencal 05/10/2010 11:57:52  _____________________________________________________________________  External Attachment:    Type:   Image     Comment:   External Document

## 2010-12-26 NOTE — Letter (Signed)
Summary: Generic Letter  Press photographer, [place]  [address] [place]   [place], [place] [address]   Phone: [phone]  Fax: [phone]        July 30, 2010 MRN: [id number]   RE: DEVEYON NAZARI [address] [place], [place]  [address]   To Whom It May Concern,  David Gregory is a 45 year-old man whom I have followed in Cardiology clinic for almost two years. He has severe congestive heart failure with a left ventricular ejection fraction of 20-25% and he is status post defibrillator placement. We have followed him almost every month in our Heart Failure clinic and he has struggled with volume overload and signifcant exercise intolerance. He has ben unable despite his desire to do so and based on his cardiac status we have advised him not to work and have been evaluating him for the need for a possible heart transplant or left venticular assist device. I think he clearly qualifies for Medicaid support and would request that you call me if there are any medical questions regarding his application.  Sincerely,   Pierre Bali, MD Co-Director, Heart Failure & Pulmonary Hypertension Programs [facility]         Sincerely,  Jolaine Artist, MD, [facility]  This letter has been electronically signed by your physician.

## 2010-12-26 NOTE — Miscellaneous (Signed)
Summary: Orders Update  Clinical Lists Changes  Orders: Added new Test order of TLB-BMP (Basic Metabolic Panel-BMET) (80048-METABOL) - Signed 

## 2010-12-26 NOTE — Letter (Signed)
Summary: Cardiac Catheterization Instructions- [place], [place]  [id number] [place]   [place], [place] [address]   Phone: [phone]  Fax: [phone]     07/08/2010 MRN: [id number]  [facility] [place], [place]  [address]  Dear Mr. CROM,   You are scheduled for a Cardiac Catheterization on Tuesday 07/09/10 with Dr. Haroldine Laws  Please arrive to the 1st floor of the Heart and Vascular Center at [facility] at 10:30 am / pm on the day of your procedure. Please do not arrive before 6:30 a.m. Call the Heart and Vascular Center at [phone] if you are unable to make your appointmnet. The Code to get into the parking garage under the building is 0002. Take the elevators to the 1st floor. You must have someone to drive you home. Someone must be with you for the first 24 hours after you arrive home. Please wear clothes that are easy to get on and off and wear slip-on shoes. Do not eat or drink after midnight except water with your medications that morning. Bring all your medications and current insurance cards with you.  _X__ DO NOT take these medications before your procedure:   Janument, Torsemide, and Spironolactone  ___ Make sure you take your aspirin.  ___ You may take ALL of your medications with water that morning. ________________________________________________________________________________________________________________________________  ___ DO NOT take ANY medications before your procedure.  ___ Pre-med instructions:  ________________________________________________________________________________________________________________________________  The usual length of stay after your procedure is 2 to 3 hours. This can vary.  If you have any questions, please call the office at the number listed above.   [name], RN

## 2010-12-26 NOTE — Assessment & Plan Note (Signed)
Summary: F3M  Medications Added GLUCOSAMINE SULFATE   POWD (GLUCOSAMINE SULFATE) two times a day      Allergies Added: NKDA  Visit Type:  Follow-up Primary Provider:  [name]   History of Present Illness: The patient presents today for routine electrophysiology followup. He reports doing very well since last being seen in our clinic.  He reports that his dypsnea is stable. The patient denies symptoms of palpitations, chest pain,  orthopnea, PND, lower extremity edema, dizziness, presyncope, syncope, or neurologic sequela. The patient is tolerating medications without difficulties and is otherwise without complaint today.   Current Medications (verified): 1)  Carvedilol 6.25 Mg Tabs (Carvedilol) .... Take One Tablet By Mouth Twice A Day 2)  Torsemide 20 Mg Tabs (Torsemide) .... Take One Tablet By Mouth Two Times A Day 3)  Digoxin 0.25 Mg Tabs (Digoxin) .... Take One Tablet By Mouth Daily 4)  Janumet 50-1000 Mg Tabs (Sitagliptin-Metformin Hcl) .... Take One Tablet By Mouth Twice Daily. 5)  Glucosamine Sulfate   Powd (Glucosamine Sulfate) .... Two Times A Day 6)  Allopurinol 100 Mg Tabs (Allopurinol) .... 2 Tabs Once Daily 7)  Hydralazine Hcl 50 Mg Tabs (Hydralazine Hcl) .... Take One Tablet By Mouth Three Times A Day 8)  Lisinopril 10 Mg Tabs (Lisinopril) .... Take One Tablet By Mouth Daily 9)  Spironolactone 25 Mg Tabs (Spironolactone) .... Take One Tablet By Mouth Daily 10)  Metolazone 2.5 Mg Tabs (Metolazone) .... Take One Tablet By Mouth Daily As Needed 11)  Potassium Chloride Crys Cr 20 Meq Cr-Tabs (Potassium Chloride Crys Cr) .... Take One Tablet By Mouth Daily As Directed  Allergies (verified): No Known Drug Allergies  Past History:  Past Medical History: Reviewed history from 03/18/2010 and no changes required. CHF due to NICM    --normal cors cath 2006    --admission for low output HF - requiring milrinone 12/10    --last echo 12/10. EF 15-20%. Restrictive  physiology    --s/p BiVICd HTN DM2 LBBB Gout h/o non-compliance h/o Tobacco & ETOH use - now quit  Past Surgical History: Reviewed history from 07/14/2009 and no changes required.  PHYSICIAN:  Nelwyn Salisbury, M.D.  DATE OF BIRTH:  09-30-1966      DATE OF PROCEDURE:  02/21/2008   DATE OF DISCHARGE:  02/18/2008                                  OPERATIVE REPORT      PROCEDURE PERFORMED:  Screening colonoscopy.      PHYSICIAN:  Nelwyn Salisbury, M.D.  DATE OF BIRTH:  November 14, 1966      DATE OF PROCEDURE:  02/17/2008   DATE OF DISCHARGE:                                  OPERATIVE REPORT      PROCEDURE PERFORMED:  Esophagogastroduodenoscopy with antral biopsies.      Social History: Reviewed history from 07/14/2009 and no changes required. The patient drinks occasional alcohol and sometimes in   significant amounts.  The patient smokes three cigarettes per day.   Works as a Dealer.  Denies illicit drug use.      Vital Signs:  Patient profile:   45 year old male Height:      64 inches Weight:      186 pounds BMI:  32.04 Pulse rate:   74 / minute BP sitting:   110 / 70  (right arm)  Vitals Entered By: Margaretmary Bayley CMA (Apr 08, 2010 11:10 AM)  Physical Exam  General:  Alert, No acute distress. Respirations unlabored. HEENT: normal Neck: supple. JVP flat. Carotids 2+ bilat; no bruits. No lymphadenopathy or thryomegaly appreciated. Cor: PMI laterally displaced.Regular rate and rhtyhm. + soft s3 No rubs.  2/6 TR Lungs: clear Abdomen:No distended Non tender. Good bowel sounds. Extremities: no cyanosis, clubbing, rash. no edema Neuro: alert & orientedx3, cranial nerves grossly intact. moves all 4 extremities w/o difficulty. affect pleasant     ICD Specifications Following MD:  Thompson Grayer, MD     Referring MD:  [facility] ICD Vendor:  Medtronic     ICD Model Number:  [id number]     ICD Serial Number:  [id number] ICD DOI:  11/20/2009     ICD Implanting MD:  Thompson Grayer, MD  Lead 1:    Location: RA     DOI: 11/20/2009     Model #: [id number]     Serial #: [id number]     Status: active Lead 2:    Location: RV     DOI: 11/20/2009     Model #: [id number]     Serial #: [id number]     Status: active Lead 3:    Location: LV     DOI: 11/20/2009     Model #: [id number]     Serial #[id number]     Status: active  Indications::  CM   ICD Follow Up Remote Check?  No Battery Voltage:  3.17 V     Charge Time:  9.3 seconds     Battery Est. Longevity:  4 + years Underlying rhythm:  NSR 74 ICD Dependent:  No       ICD Device Measurements Atrium:  Amplitude: 3.1 mV, Impedance: 494 ohms, Threshold: 0.75 V at 0.4 msec Right Ventricle:  Amplitude: 7.4 mV, Impedance: 342 ohms, Threshold: 1.0 V at 0.4 msec Left Ventricle:  Impedance: 437 ohms, Threshold: 0.5 V at 0.4 msec Configuration: LV TIP TO RV COIL  Episodes MS Episodes:  0     Percent Mode Switch:  0     Shock:  0     ATP:  0     Nonsustained:  3 longest=11 beats     Atrial Pacing:  1.3     Ventricular Pacing:  99.8  Brady Parameters Mode DDD     Lower Rate Limit:  60     Upper Rate Limit 130 PAV 130     Sensed AV Delay:  100  Tachy Zones VF:  200     VT:  monitor 171-200     Tech Comments:  decreased RA/RV outputs RX ran [employer] up Labored breathing MD Comments:  optivol remains elevated NSVT no shocks for VT/VF   Impression & Recommendations:  Problem # 1:  SYSTOLIC HEART FAILURE, ACUTE ON CHRONIC (ICD-428.23) stable,  Pt recently seen by Dr Haroldine Laws for CHF and elevated optivol salt restriction encouraged continue medical therapy  Problem # 2:  VENTRICULAR TACHYCARDIA (ICD-427.1)  NSVT stable continue coreg normal icd function, as above  His updated medication list for this problem includes:    Carvedilol 6.25 Mg Tabs (Carvedilol) .[facility]... Take one tablet by mouth twice a day    Lisinopril 10 Mg Tabs (Lisinopril) .Marland Kitchen... Take one tablet by mouth daily  Problem # 3:  TOBACCO ABUSE  (ICD-305.1) cessation  encouraged  Patient Instructions: 1)  Your physician recommends that you schedule a follow-up appointment in: 3 months in the device clinic

## 2010-12-26 NOTE — Letter (Signed)
Summary: Generic Letter  Press photographer, [place]  [address] [place]   [place], [place] [address]   Phone: [phone]  Fax: [phone]        November 12, 2009 MRN: [id number]    [facility] [address] [place], [place]  [address]   To Whom it May Concern,  [name] is a 45 year old man whom I follow in our Cardiology clinic for severe biventricular heart failure. He was recently in the hospital for 2 weeks for his heart failure and required intravenous inotropic support. Given the severity of his heart disease he will not be able to work for the forseeable furture and we may even need to consider heart transplantation. I have thus suggested he apply for disability benefits and Medicaid.   Please do not hesitate to call me with any questions. Thank you for your consideration.   Sincerely,  Jolaine Artist, MD co-director, [facility] [phone]  This letter has been electronically signed by your physician.

## 2010-12-26 NOTE — Assessment & Plan Note (Signed)
Summary: per check out/sf  Medications Added LANOXIN 0.25 MG TABS (DIGOXIN) Take 1 tablet by mouth once a day ACCUPRIL 5 MG TABS (QUINAPRIL HCL) Take 1 tablet by mouth two times a day      Allergies Added: NKDA  Visit Type:  Follow-up Primary Provider:  [name]  CC:  no complaints.  History of Present Illness: David Gregory is a  45 year old male with history of congestive heart failure due to nonischemic cardiomyopathy iniital EF 15-25%.  He also has a history of hypertension, diabetes, previous tobacco and alcohol use as well as medical noncompliance.   He was hospitalized in 12/10 of last year with low output state and was found to have worsening of his LV function with an EF of 15%.  He required inotrope support. Since that time he has underwent placement of a BiVICD. Weight on d/c was 184 pounds.   We sam him last month ago and was doing fairly well. Coreg incresed to 9.375 bid. Which he toleraated well. Doing fairly well able to get around and even go up steps. Says his knees bother him more than his heart. Weight up 11 pounds but denies edema, orthopnea or PND. Compliant with meds.  No ETOH or cigarettes since 12/10.    Current Medications (verified): 1)  Carvedilol 6.25 Mg Tabs (Carvedilol) .... Take 1 & 1/2 Tablet By Mouth Twice A Day 2)  Torsemide 20 Mg Tabs (Torsemide) .... Take One Tablet By Mouth Two Times A Day 3)  Digoxin 0.25 Mg Tabs (Digoxin) .... Take One Tablet By Mouth Daily 4)  Janumet 50-1000 Mg Tabs (Sitagliptin-Metformin Hcl) .... Take One Tablet By Mouth Twice Daily. 5)  Glucosamine Sulfate   Powd (Glucosamine Sulfate) .... Two Times A Day 6)  Allopurinol 100 Mg Tabs (Allopurinol) .... 2 Tabs Once Daily 7)  Hydralazine Hcl 50 Mg Tabs (Hydralazine Hcl) .... Take One Tablet By Mouth Three Times A Day 8)  Lisinopril 10 Mg Tabs (Lisinopril) .... Take One Tablet By Mouth Daily 9)  Spironolactone 25 Mg Tabs (Spironolactone) .... Take One Tablet By Mouth  Daily 10)  Metolazone 2.5 Mg Tabs (Metolazone) .... Take One Tablet By Mouth Daily As Needed 11)  Potassium Chloride Crys Cr 20 Meq Cr-Tabs (Potassium Chloride Crys Cr) .... Take One Tablet By Mouth Daily As Directed  Allergies (verified): No Known Drug Allergies  Review of Systems       As per HPI and past medical history; otherwise all systems negative.   Vital Signs:  Patient profile:   45 year old male Height:      64 inches Weight:      189 pounds BMI:     32.56 Pulse rate:   65 / minute BP sitting:   92 / 56  (left arm) Cuff size:   regular  Vitals Entered By: Mignon Pine, RMA (May 30, 2010 12:03 PM)  Physical Exam  General:  Alert, No acute distress. Respirations unlabored. HEENT: normal Neck: supple. JVP to jaw Carotids 2+ bilat; no bruits. No lymphadenopathy or thryomegaly appreciated. Cor: PMI laterally displaced.Regular rate and rhtyhm. +s3 No rubs.  2/6 TR Lungs: clear Abdomen:No distended Non tender. Good bowel sounds. Extremities: no cyanosis, clubbing, rash. 1+ edema Neuro: alert & orientedx3, cranial nerves grossly intact. moves all 4 extremities w/o difficulty. affect pleasant     ICD Specifications Following MD:  Thompson Grayer, MD     Referring MD:  Haroldine Laws ICD Vendor:  [employer]tronic     ICD Model  Number:  [id number]     ICD Serial Number:  [id number] ICD DOI:  11/20/2009     ICD Implanting MD:  Thompson Grayer, MD  Lead 1:    Location: RA     DOI: 11/20/2009     Model #: [id number]     Serial #: [id number]     Status: active Lead 2:    Location: RV     DOI: 11/20/2009     Model #: [id number]     Serial #: [id number]     Status: active Lead 3:    Location: LV     DOI: 11/20/2009     Model #: [id number]     Serial #: [id number]     Status: active  Indications::  CM   ICD Follow Up ICD Dependent:  No       ICD Device Measurements Configuration: LV TIP TO RV COIL  Brady Parameters Mode DDD     Lower Rate Limit:  60     Upper Rate Limit 130 PAV 130     Sensed AV  Delay:  100  Tachy Zones VF:  200     VT:  monitor 171-200     Impression & Recommendations:  Problem # 1:  SYSTOLIC HEART FAILURE, ACUTE ON CHRONIC (ICD-428.23) Assessment Unchanged Reports NYHA II symtoms however has significant volume overload on exam (drinking a lot of water in the heat). Will have him take metolazone for 2 days and be more dilligent about following weights. Increase lisinopril to 5 two times a day. Check optivol today. Bring back for a nurse visit to check weight, optivol and labs. Will arrange [facility] program to help educate him on daily weights, etc. Will need repeat echo and CPX soon.   Problem # 2:  TOBACCO ABUSE (ICD-305.1) Abstinent from tobacco and ETOH since December 2010. I congratulated him on this.   Patient Instructions: 1)  Change Lisionpril to Accupril 5mg  two times a day  2)  Change Digoxin to Lanoxin 0.25mg  daily 3)  Your physician recommends that you return for lab work in: 1 week (bmet, bnp 428.23) 4)  Nurse Visit on Fri 7/15 pm with Heather 5)  Follow up in 4-6 weeks Prescriptions: ACCUPRIL 5 MG TABS (QUINAPRIL HCL) Take 1 tablet by mouth two times a day  #180 x 3   Entered by:   [name], RN   Authorized by:   Jolaine Artist, MD, [facility]   Signed by:   [name], RN on 05/30/2010   Method used:   Printed then faxed to ...       [facility]       [place],   [address]       Ph: [phone]       Fax: [phone]   RxID:   [id number] LANOXIN 0.25 MG TABS (DIGOXIN) Take 1 tablet by mouth once a day  #90 x 3   Entered by:   [name], RN   Authorized by:   Jolaine Artist, MD, [facility]   Signed by:   [name], RN on 05/30/2010   Method used:   Printed then faxed to ...       [facility]       [place],  [place]  [address]       Ph: [phone]       Fax: [phone]   [id number]

## 2010-12-26 NOTE — Cardiovascular Report (Signed)
Summary: Heart Failure Management  Heart Failure Management   Imported By: Marilynne Drivers 06/07/2010 11:15:14  _____________________________________________________________________  External Attachment:    Type:   Image     Comment:   External Document

## 2010-12-26 NOTE — Letter (Signed)
Summary: [facility]   Imported By: Marilynne Drivers 08/07/2010 11:37:10  _____________________________________________________________________  External Attachment:    Type:   Image     Comment:   External Document

## 2010-12-26 NOTE — Letter (Signed)
Summary: Release of info/DDS  Release of info/DDS   Imported By: Phillis Knack 12/10/2009 12:07:48  _____________________________________________________________________  External Attachment:    Type:   Image     Comment:   External Document

## 2010-12-26 NOTE — Progress Notes (Signed)
Summary: call pt   Phone Note Call from Patient Call back at Home Phone [phone]   Caller: Patient Reason for Call: Talk to Nurse, Talk to Doctor Summary of Call: call him..when I asked what was it regarding he said that was personal Initial call taken by: Shelda Pal,  January 03, 2010 12:34 PM  Follow-up for Phone Call        spoke w/pt, he needs to wait and have cpx in a month or 2 due to money, discussed w/Dr Bensimhon ok w/him, pt aware will call and cx cpx, order sent to [facility] to est w/pcp [name], RN  January 03, 2010 6:15 PM

## 2010-12-26 NOTE — Letter (Signed)
Summary: Cardiac Catheterization Instructions- [place], [place]  [id number] [place]   [place], [place] [address]   Phone: [phone]  Fax: [phone]     12/05/2010 MRN: [id number]  [facility] [place], [place]  [address]  Dear Mr. Milroy,   You are scheduled for a Cardiac Catheterization on Wed. 12/11/10 with Dr.Heliodoro Domagalski  Please arrive to the 1st floor of the Heart and Vascular Center at [facility] at 12:30 pm on the day of your procedure. Please do not arrive before 6:30 a.m. Call the Heart and Vascular Center at [phone] if you are unable to make your appointmnet. The Code to get into the parking garage under the building is [place]. Take the elevators to the 1st floor. You must have someone to drive you home. Someone must be with you for the first 24 hours after you arrive home. Please wear clothes that are easy to get on and off and wear slip-on shoes. Do not eat or drink after midnight except water with your medications that morning. Bring all your medications and current insurance cards with you.  _X__ DO NOT take these medications before your procedure: ________Janumet________________________________________________________  ___ Make sure you take your aspirin.  ___ You may take ALL of your medications with water that morning. ________________________________________________________________________________________________________________________________  ___ DO NOT take ANY medications before your procedure.  ___ Pre-med instructions:  ________________________________________________________________________________________________________________________________  The usual length of stay after your procedure is 2 to 3 hours. This can vary.  If you have any questions, please call the office at the number listed above.   [name], RN

## 2010-12-26 NOTE — Letter (Signed)
Summary: Appointment - Missed  [facility], [place]  [address] [place]   [place], [place] [address]   Phone: [phone]  Fax: [phone]     July 19, 2010 MRN: [id number]   [facility] [address] [place], [place]  [address]   Dear [name],  Our records indicate you missed your appointment on August 17th, 2011, with Dr.  [facility]. It is very important that we reach you to reschedule this appointment. We look forward to participating in your health care needs. Please contact us at the number listed above at your earliest convenience to reschedule this appointment.     Sincerely,    Public relations account executive

## 2010-12-26 NOTE — Consult Note (Signed)
Summary: [facility]  [facility]   Imported By: Tobin Chad 06/26/2010 15:15:25  _____________________________________________________________________  External Attachment:    Type:   Image     Comment:   External Document

## 2010-12-26 NOTE — Assessment & Plan Note (Signed)
Summary: 1 month rov.sl  Medications Added CARVEDILOL 6.25 MG TABS (CARVEDILOL) Take 1 & 1/2 tablet by mouth twice a day      Allergies Added: NKDA  Primary Provider:  [name]  CC:  1 month rov.  [facility]  History of Present Illness: David Gregory is a  45 year old male with history of congestive heart failure due to nonischemic cardiomyopathy iniital EF 15-25%.  He also has a history of hypertension, diabetes, previous tobacco and alcohol use as well as medical noncompliance.   He was hospitalized in 12/10 of last year with low output state and was found to have worsening of his LV function with an EF of 15%.  He required inotrope support. Since that time he has underwent placement of a BiVICD. Weight on d/c was 184 pounds.   We sam him abbout a month ago and was doing fairly well but OptiVol way up. Clinically did not appear volume overloaded so we just held tight. Continues to feel ok. Able to get around as long as he goes slowly. No edema. weight stable. No icd firings.    No ETOH or cigarettes since 12/10.  Device interogated. No VT. Optivol back down BiV pacing 95% of time.  Current Medications (verified): 1)  Carvedilol 6.25 Mg Tabs (Carvedilol) .... Take One Tablet By Mouth Twice A Day 2)  Torsemide 20 Mg Tabs (Torsemide) .... Take One Tablet By Mouth Two Times A Day 3)  Digoxin 0.25 Mg Tabs (Digoxin) .... Take One Tablet By Mouth Daily 4)  Janumet 50-1000 Mg Tabs (Sitagliptin-Metformin Hcl) .... Take One Tablet By Mouth Twice Daily. 5)  Glucosamine Sulfate   Powd (Glucosamine Sulfate) .... Two Times A Day 6)  Allopurinol 100 Mg Tabs (Allopurinol) .... 2 Tabs Once Daily 7)  Hydralazine Hcl 50 Mg Tabs (Hydralazine Hcl) .... Take One Tablet By Mouth Three Times A Day 8)  Lisinopril 10 Mg Tabs (Lisinopril) .... Take One Tablet By Mouth Daily 9)  Spironolactone 25 Mg Tabs (Spironolactone) .... Take One Tablet By Mouth Daily 10)  Metolazone 2.5 Mg Tabs (Metolazone) .... Take One Tablet  By Mouth Daily As Needed 11)  Potassium Chloride Crys Cr 20 Meq Cr-Tabs (Potassium Chloride Crys Cr) .... Take One Tablet By Mouth Daily As Directed  Allergies (verified): No Known Drug Allergies  Past History:  Past Medical History: Last updated: 03/18/2010 CHF due to NICM    --normal cors cath 2006    --admission for low output HF - requiring milrinone 12/10    --last echo 12/10. EF 15-20%. Restrictive physiology    --s/p BiVICd HTN DM2 LBBB Gout h/o non-compliance h/o Tobacco & ETOH use - now quit  Review of Systems       As per HPI and past medical history; otherwise all systems negative.   Vital Signs:  Patient profile:   45 year old male Height:      64 inches Weight:      178 pounds BMI:     30.66 Pulse rate:   87 / minute BP sitting:   120 / 76  (left arm) Cuff size:   large  Vitals Entered By: David Gregory CMA (May 01, 2010 12:30 PM)  Physical Exam  General:  Alert, No acute distress. Respirations unlabored. HEENT: normal Neck: supple. JVP flat. Carotids 2+ bilat; no bruits. No lymphadenopathy or thryomegaly appreciated. Cor: PMI laterally displaced.Regular rate and rhtyhm. +s3 No rubs.  2/6 TR Lungs: clear Abdomen:No distended Non tender. Good bowel sounds. Extremities:  no cyanosis, clubbing, rash. no edema Neuro: alert & orientedx3, cranial nerves grossly intact. moves all 4 extremities w/o difficulty. affect pleasant     ICD Specifications Following MD:  Thompson Grayer, MD     Referring MD:  [facility] ICD Vendor:  [employer]tronic     ICD Model Number:  [id number]     ICD Serial Number:  [id number] ICD DOI:  11/20/2009     ICD Implanting MD:  Thompson Grayer, MD  Lead 1:    Location: RA     DOI: 11/20/2009     Model #: [id number]     Serial #: [id number]     Status: active Lead 2:    Location: RV     DOI: 11/20/2009     Model #: [id number]     Serial #: [id number]     Status: active Lead 3:    Location: LV     DOI: 11/20/2009     Model #: [id number]     Serial #: [id number]      Status: active  Indications::  CM   ICD Follow Up ICD Dependent:  No       ICD Device Measurements Configuration: LV TIP TO RV COIL  Brady Parameters Mode DDD     Lower Rate Limit:  60     Upper Rate Limit 130 PAV 130     Sensed AV Delay:  100  Tachy Zones VF:  200     VT:  monitor 171-200     Impression & Recommendations:  Problem # 1:  SYSTOLIC HEART FAILURE, CHRONIC (ICD-428.22) Stable NYHA III. Volume status looks good. Titrate coreg to 9.375 two times a day (do night dose for 1 week then increase am dose)  ETOH and cotinine levels negative. Needs f/u echo but can't afford so will defer. See back in 1 month for titration fo b-blocker.   Other Orders: EKG w/ Interpretation (93000)  Patient Instructions: 1)  Increase Carvedilol to 1 & 1/2 tabs two times a day, do this by 1st increasing evening dose a week then increase the AM dose as well. 2)  Follow up in 1 month Prescriptions: CARVEDILOL 6.25 MG TABS (CARVEDILOL) Take 1 & 1/2 tablet by mouth twice a day  #90 x 2   Entered by:   [name], RN   Authorized by:   Jolaine Artist, MD, [facility]   Signed by:   [name], RN on 05/01/2010   Method used:   Print then Give to Patient   RxID:   [id number]

## 2010-12-26 NOTE — Progress Notes (Addendum)
Summary: swollen feet and ankles  Phone Note Call from Patient Call back at Home Phone [phone]   Caller: Patient Summary of Call: Swelling feet and ankles Initial call taken by: Delsa Sale,  December 20, 2010 8:55 AM  Follow-up for Phone Call        he states his up a couple of pounds since d/c from hospital, and his feet are very swollen, he states he can tell he is retaining fluid, he has not taken meds yet, advised to go ahead and take meds including torsemide and I will discuss w/Dr Aanvi Voyles and call him back [name], RN  December 20, 2010 9:12 AM   per Dr Haroldine Laws double torsemide to 40 two times a day for a few days, fax pts info to Dr Jerilee Hoh at [facility], RN  December 20, 2010 12:10 PM   Additional Follow-up for Phone Call Additional follow up Details #1::        pt is aware he will increase torsemide today and tom. if not better will take metolazone, if he gets worse will call, appt. sch for Fri 2/3 at 10:15 [facility], RN  December 20, 2010 3:25 PM      Appended Document: swollen feet and ankles called and spoke w/pt he states the increased med has not helped, he is going to take a metolazone and let me know how it works

## 2010-12-26 NOTE — Assessment & Plan Note (Signed)
Summary: PC2  Medications Added CARVEDILOL 12.5 MG TABS (CARVEDILOL) Take one tablet by mouth twice a day      Allergies Added: NKDA  Current Medications (verified): 1)  Carvedilol 3.125 Mg Tabs (Carvedilol) .... Take One Tablet By Mouth Twice A Day 2)  Torsemide 20 Mg Tabs (Torsemide) .... Take One Tablet By Mouth Two Times A Day 3)  Digoxin 0.25 Mg Tabs (Digoxin) .... Take One Tablet By Mouth Daily 4)  Janumet 50-1000 Mg Tabs (Sitagliptin-Metformin Hcl) .... Take One Tablet By Mouth Twice Daily. 5)  Glucosamine Sulfate   Powd (Glucosamine Sulfate) .... Two Times A Day N 6)  Allopurinol 100 Mg Tabs (Allopurinol) .... 2 Tabs Once Daily 7)  Hydralazine Hcl 25 Mg Tabs (Hydralazine Hcl) .... Take 1 1/2 Tablet By Mouth Three Times A Day 8)  Isosorbide Mononitrate Cr 30 Mg Xr24h-Tab (Isosorbide Mononitrate) .... Take One Tablet By Mouth Daily 9)  Lisinopril 5 Mg Tabs (Lisinopril) .... Take One Tablet By Mouth Daily 10)  Mag-Ox 400 400 Mg Tabs (Magnesium Oxide) .... One Tablet Three Times A Day 11)  Spironolactone 25 Mg Tabs (Spironolactone) .... Take One Tablet By Mouth Daily  Allergies (verified): No Known Drug Allergies    ICD Specifications Following MD:  Thompson Grayer, MD     Referring MD:  BENSIMHON ICD Vendor:  Medtronic     ICD Model Number:  [id number]     ICD Serial Number:  [id number] ICD DOI:  11/20/2009     ICD Implanting MD:  Thompson Grayer, MD  Lead 1:    Location: RA     DOI: 11/20/2009     Model #: [id number]     Serial #: [id number]     Status: active Lead 2:    Location: RV     DOI: 11/20/2009     Model #: [id number]     Serial #: [id number]     Status: active Lead 3:    Location: LV     DOI: 11/20/2009     Model #: [id number]     Serial #[id number]     Status: active  Indications::  CM   ICD Follow Up Remote Check?  No Battery Voltage:  3.18 V     Charge Time:  9.3 seconds     Underlying rhythm:  ST ICD Dependent:  No       ICD Device Measurements Atrium:  Amplitude: 2.9 mV,  Impedance: 532 ohms, Threshold: 0.5 V at 0.4 msec Right Ventricle:  Amplitude: 9.0 mV, Impedance: 418 ohms, Threshold: 0.5 V at 0.4 msec Left Ventricle:  Impedance: 494 ohms, Threshold: 0.5 V at 0.4 msec Configuration: LV TIP TO RV COIL Shock Impedance: 41/54 ohms   Episodes MS Episodes:  0     Shock:  0     ATP:  0     Nonsustained:  4     Atrial Pacing:  0.4%     Ventricular Pacing:  99.8%  Brady Parameters Mode DDD     Lower Rate Limit:  60     Upper Rate Limit 130 PAV 130     Sensed AV Delay:  100  Tachy Zones VF:  200     Next Cardiology Appt Due:  02/22/2010 Tech Comments:  Wound check appt.  Steri-strips removed.  Wound without redness or edema.  Normal device function.  Device unable to measure LV threshold past 7 days because of increased sinus rate.  Histagrams show almost all heart rates are over  90 bpm.  D/W Dr Rayann Heman, patient's Coreg increased today from 3.125mg  two times a day to 12.5mg  two times a day per Dr Rayann Heman.  Pt's blood pressure today 112/78.  New RX sent to [facility] on [address]. Appt made with Dr Haroldine Laws for 3-4 weeks for [facility] and to re-evaluate heart rates. Pt will keep appt 4-1 with Dr Rayann Heman. Chanetta Marshall RN BSN  December 03, 2009 12:51 PM    Prescriptions: CARVEDILOL 12.5 MG TABS (CARVEDILOL) Take one tablet by mouth twice a day  #60 x 11   Entered by:   Hotel manager   Authorized by:   Thompson Grayer, MD   Signed by:   Chanetta Marshall RN BSN on 12/03/2009   Method used:   Electronically to        [facility] (retail)       [address]       [place], [place]  [address]       Ph: [phone]       Fax: [phone]   RxID:   [id number]

## 2010-12-26 NOTE — Assessment & Plan Note (Signed)
Summary: 9:00  f31m  Medications Added CARVEDILOL 25 MG TABS (CARVEDILOL) Take one tablet by mouth twice a day VIAGRA 100 MG TABS (SILDENAFIL CITRATE) take one tablet as needed as directed      Allergies Added: NKDA  Visit Type:  Follow-up Primary Provider:  [name]  CC:  No complaints.  History of Present Illness: David Gregory is a 45 year old male with history of congestive heart failure due to nonischemic cardiomyopathy iniital EF 15-25%.  He also has a history of hypertension, diabetes, previous tobacco and alcohol use as well as medical noncompliance.   He was hospitalized in 12/10 of last year with low output state and was found to have worsening of his LV function with an EF of 15%.  He required inotrope support. Since that time he has underwent placement of a BiVICD. Weight on d/c was 184 pounds.   Over past few weeks we have been adjusting diuretics as he had some renal insufficiency. Initially demadex cut back to once a day but then fluid came back so now back to two times a day. Only taking metolazone as needed. We saw him last week and he was doing well. BNP down at 395 but Cr plateued at 2.0 which is up from 1.0-1.1 previously. Pending CPX test in early October.  Returns for f/u. At last visit we increased coreg to 18.75 two times a day. Tolerated well. Feels ok. Weight up about 5 pounds. Feels bloated. Denies CP or SOB. No LE edema. Having problems with ED.  Just got Medicaid approved.   CPX Last week (10/10): pVO2 12.6 (40%) Slope 51 RER 1.13 O2 pulse 53%   No ETOH or cigarettes since 12/10.    Current Medications (verified): 1)  Coreg 12.5 Mg Tabs (Carvedilol) .Marland Kitchen.. 1 & 1/2  Tablet Two Times A Day 2)  Torsemide 20 Mg Tabs (Torsemide) .... Take One Tablet By Mouth Two Times A Day 3)  Lanoxin 0.25 Mg Tabs (Digoxin) .... Take 1 Tablet By Mouth Once A Day 4)  Janumet 50-1000 Mg Tabs (Sitagliptin-Metformin Hcl) .... Take One Tablet By Mouth Twice Daily. 5)  Glucosamine  Sulfate   Powd (Glucosamine Sulfate) .... Two Times A Day 6)  Allopurinol 100 Mg Tabs (Allopurinol) .... 2 Tabs Once Daily 7)  Hydralazine Hcl 50 Mg Tabs (Hydralazine Hcl) .... Take One Tablet By Mouth Three Times A Day 8)  Accupril 5 Mg Tabs (Quinapril Hcl) .... Take 1 Tablet By Mouth Two Times A Day 9)  Metolazone 2.5 Mg Tabs (Metolazone) .... Take One Tablet By Mouth Daily As Needed 10)  Potassium Chloride Crys Cr 20 Meq Cr-Tabs (Potassium Chloride Crys Cr) .... Take One Tablet By Mouth Daily As Directed  Allergies (verified): No Known Drug Allergies  Past History:  Past Medical History: CHF due to NICM    --normal cors cath 2006    --admission for low output HF - requiring milrinone 12/10    --last echo 12/10. EF 15-20%. Restrictive physiology    --s/p BiVICd    --CPX (10/10): pVO2 12.6 (40%) Slope 51 RER 1.13 O2 pulse 53%  HTN DM2 LBBB Gout h/o non-compliance h/o Tobacco & ETOH use - now quit  Review of Systems       As per HPI and past medical history; otherwise all systems negative.   Vital Signs:  Patient profile:   45 year old male Height:      64 inches Weight:      191.50 pounds BMI:  32.99 Pulse rate:   72 / minute Resp:     18 per minute BP sitting:   110 / 80  (left arm) Cuff size:   large  Vitals Entered By: Sidney Ace (September 02, 2010 9:09 AM)  Physical Exam  General:  Alert, No acute distress. Respirations unlabored. HEENT: normal Neck: supple. JVP flat Carotids 2+ bilat; no bruits. No lymphadenopathy or thryomegaly appreciated. Cor: PMI laterally displaced.Regular rate and rhythm. no s3  No rubs.  2/6 TR Lungs: clear Abdomen:No distended Non tender. Good bowel sounds. Extremities: no cyanosis, clubbing, rash. no edema Neuro: alert & orientedx3, cranial nerves grossly intact. moves all 4 extremities w/o difficulty. affect pleasant     ICD Specifications Following MD:  Thompson Grayer, MD     Referring MD:  [facility] ICD Vendor:   Medtronic     ICD Model Number:  [id number]     ICD Serial Number:  [id number] H ICD DOI:  11/20/2009     ICD Implanting MD:  Thompson Grayer, MD  Lead 1:    Location: RA     DOI: 11/20/2009     Model #: [id number]     Serial #: [id number]     Status: active Lead 2:    Location: RV     DOI: 11/20/2009     Model #: [id number]     Serial #: [id number]     Status: active Lead 3:    Location: LV     DOI: 11/20/2009     Model #: [id number]     Serial #: [id number]     Status: active  Indications::  CM   ICD Follow Up ICD Dependent:  No       ICD Device Measurements Configuration: LV TIP TO RV COIL  Episodes Coumadin:  No  Brady Parameters Mode DDD     Lower Rate Limit:  60     Upper Rate Limit 130 PAV 130     Sensed AV Delay:  100  Tachy Zones VF:  200     VT:  monitor 171-200     Impression & Recommendations:  Problem # 1:  SYSTOLIC HEART FAILURE, CHRONIC (ICD-428.22) Doing fairly well. NYHA III. Volume status looks good although weight up a bit (suspect may be adipose) will check Optivol. CPX results reviewed at length. He does meet criteria for advanced therapies but given that he is feeling pretty well we will continue with aggressive medical therapy. We will follow him closely. If symptoms worsen on renal function deteriorates will need repeat RHC and referral for consideration of advanced therapies. Increase Coreg to 25 two times a day.   Problem # 2:  ERECTILE DYSFUNCTION, ORGANIC (ICD-607.84) Discussed fact that he should be on Imdur to go along with hydralazine and this would preclude Viagra. However, he has decided to forego Imdur and proceed with a trial of Viagra.   Other Orders: T-Nicotine Assay & Metabolites, Serum [id number]) TLB-BMP (Basic Metabolic Panel-BMET) (99991111) TLB-BNP (B-Natriuretic Peptide) (83880-BNPR)  Patient Instructions: 1)  Your physician recommends that you schedule a follow-up appointment in: 1 month 2)  Your physician has recommended you make the following change  in your medication: Increase carvedilol to 25 mg by mouth two times a day. 3)  Viagara 100 mg by mouth as needed as directed. Prescriptions: VIAGRA 100 MG TABS (SILDENAFIL CITRATE) take one tablet as needed as directed  #8 x 6   Entered by:   Thompson Grayer, RN, BSN   Authorized by:  Jolaine Artist, MD, [facility]   Signed by:   Thompson Grayer, RN, BSN on 09/02/2010   Method used:   Electronically to        [facility] (retail)       [address]       [place], [place]  [address]       Ph: [phone]       Fax: [phone]   RxID:   [id number] CARVEDILOL 25 MG TABS (CARVEDILOL) Take one tablet by mouth twice a day  #60 x 6   Entered by:   Thompson Grayer, RN, BSN   Authorized by:   Jolaine Artist, MD, [facility]   Signed by:   Thompson Grayer, RN, BSN on 09/02/2010   Method used:   Electronically to        [facility] (retail)       [address]       [place], [place]  [address]       Ph: [phone]       Fax: [phone]   RxID:   [id number]

## 2010-12-26 NOTE — Cardiovascular Report (Signed)
Summary: Office Visit   Office Visit   Imported By: Sallee Provencal 08/12/2010 16:16:24  _____________________________________________________________________  External Attachment:    Type:   Image     Comment:   External Document

## 2010-12-26 NOTE — Assessment & Plan Note (Signed)
Summary: wt gain/pt needs to set up with pcp  Medications Added HYDRALAZINE HCL 25 MG TABS (HYDRALAZINE HCL) Take 1 & 1/2  tablet by mouth three times a day      Allergies Added: NKDA  Visit Type:  Follow-up Primary Provider:  [name]  CC:  weight gain.  History of Present Illness: [name] is a  45 year old male with history of congestive heart failure due to nonischemic cardiomyopathy inital EF 15-25%.  He also has a history of hypertension, diabetes, previous tobacco and alcohol use as well as medical noncompliance.   He was hospitalized in 12/10 of last year with low output state and was found to have worsening of his LV function with an EF of 15%.  He required intrope support. Since that time he has underwent placement of a BiVICD. Weight on d/c was 184 pounds.   Over last week weight up about 10 pounds. + chest tightness, orthopnea, pnd. Called [place] earlier in the week and started metolazone. now back down to baseline Down 8 pounds.  Still gets SOB with mild exertion. Can walk a few blocks before he has to stop. Not smoking or drinking.  Current Medications (verified): 1)  Carvedilol 6.25 Mg Tabs (Carvedilol) .... Take One Tablet By Mouth Twice A Day 2)  Torsemide 20 Mg Tabs (Torsemide) .... Take One Tablet By Mouth Two Times A Day 3)  Digoxin 0.25 Mg Tabs (Digoxin) .... Take One Tablet By Mouth Daily 4)  Janumet 50-1000 Mg Tabs (Sitagliptin-Metformin Hcl) .... Take One Tablet By Mouth Twice Daily. 5)  Glucosamine Sulfate   Powd (Glucosamine Sulfate) .... Two Times A Day N 6)  Allopurinol 100 Mg Tabs (Allopurinol) .... 2 Tabs Once Daily 7)  Hydralazine Hcl 25 Mg Tabs (Hydralazine Hcl) .... Take 1 Tablet By Mouth Three Times A Day 8)  Lisinopril 5 Mg Tabs (Lisinopril) .... Take One Tablet By Mouth Daily 9)  Spironolactone 25 Mg Tabs (Spironolactone) .... Take One Tablet By Mouth Daily 10)  Metolazone 2.5 Mg Tabs (Metolazone) .... Take One Tablet By Mouth Daily As  Directed 11)  Potassium Chloride Crys Cr 20 Meq Cr-Tabs (Potassium Chloride Crys Cr) .... Take One Tablet By Mouth Daily As Directed  Allergies (verified): No Known Drug Allergies  Vital Signs:  Patient profile:   45 year old male Height:      64 inches Weight:      188 pounds BMI:     32.39 Pulse rate:   98 / minute BP sitting:   96 / 60  (left arm) Cuff size:   regular  Vitals Entered By: Mignon Pine, RMA (January 24, 2010 4:29 PM)  Physical Exam  General:  Looks ok. No acute distress. Respirations unlabored. HEENT: normal Neck: supple. JVP flat. Carotids 2+ bilat; no bruits. No lymphadenopathy or thryomegaly appreciated. Cor: PMI laterally displaced.Tachycardic and regular.+ soft s3 No rubs.  2/6 TR Lungs: clear Abdomen:No distended Non tender. Good bowel sounds. Extremities: no cyanosis, clubbing, rash. no edema Neuro: alert & orientedx3, cranial nerves grossly intact. moves all 4 extremities w/o difficulty. affect pleasant     ICD Specifications Following MD:  Thompson Grayer, MD     Referring MD:  [facility] ICD Vendor:  [employer]tronic     ICD Model Number:  [id number]     ICD Serial Number:  [id number] H ICD DOI:  11/20/2009     ICD Implanting MD:  Thompson Grayer, MD  Lead 1:    Location: RA  DOI: 11/20/2009     Model #: [id number]     Serial #: [id number]     Status: active Lead 2:    Location: RV     DOI: 11/20/2009     Model #: [id number]     Serial #: [id number]     Status: active Lead 3:    Location: LV     DOI: 11/20/2009     Model #: [id number]     Serial #: [id number]     Status: active  Indications::  CM   ICD Follow Up ICD Dependent:  No       ICD Device Measurements Configuration: LV TIP TO RV COIL  Brady Parameters Mode DDD     Lower Rate Limit:  60     Upper Rate Limit 130 PAV 130     Sensed AV Delay:  100  Tachy Zones VF:  200     Impression & Recommendations:  Problem # 1:  SYSTOLIC HEART FAILURE, CHRONIC (ICD-428.22) Volume status much improved with metolzaone.  Will continue to use as needed. He remains tenuous with NYHA Class III symptoms. Increase hydralazine to 37.5 three times a day. Check electrolytes, BMET and serum cotinine levels tomorrow. Will likely need transplant down the road. Needs 1 year free ot tobacco and ETOH.  Other Orders: EKG w/ Interpretation (93000)  Patient Instructions: 1)  Metolazone 2.5mg  as needed 2)  Increase Hydralazine to 25mg  1 & 1/2 tab three times a day  3)  Labs on Monday--bmet, bnp, cotinine level 4)  Follow up on 3/29 Prescriptions: HYDRALAZINE HCL 25 MG TABS (HYDRALAZINE HCL) Take 1 & 1/2  tablet by mouth three times a day  #135 x 6   Entered by:   [name], RN   Authorized by:   Jolaine Artist, MD, [facility]   Signed by:   [name], RN on 01/24/2010   Method used:   Electronically to        [facility] (retail)       [address]       [place], [place]  [address]       Ph: [phone]       Fax: [phone]   [id number] METOLAZONE 2.5 MG TABS (METOLAZONE) Take one tablet by mouth daily as directed  #30 x 6   Entered by:   [name], RN   Authorized by:   Jolaine Artist, MD, [facility]   Signed by:   [name], RN on 01/24/2010   Method used:   Electronically to        [facility] (retail)       [address]       [place], [place]  [address]       Ph: [phone]       Fax: [phone]   RxID:   [id number]

## 2010-12-26 NOTE — Progress Notes (Signed)
Summary: Needs surgical clearance   Phone Note Call from Patient Call back at Home Phone [phone]   Caller: Patient Summary of Call: Pt needs surgical clearance Initial call taken by: Delsa Sale,  June 10, 2010 4:17 PM  Follow-up for Phone Call        per Dr Gillermina Hu note on the nurse visit pt can have surgery w/local or reginal anesthesia only, no general anesthesia na x 7 rings [name], RN  June 10, 2010 5:18 PM  na x 7 rings [name], RN  June 11, 2010 10:54 AM  na x 7 rings [name], RN  June 11, 2010 4:59 PM  left mess w/male for pt to call back [name], RN  June 12, 2010 12:54 PM   spoke w/pt he is aware, note faxed to Dr [name] at [facility], RN  June 12, 2010 3:24 PM

## 2010-12-26 NOTE — Progress Notes (Signed)
Summary: Question about medication changes   Phone Note Call from Patient Call back at Home Phone [phone]   Caller: Patient Summary of Call: Pt have question about medication changes Initial call taken by: Delsa Sale,  June 19, 2010 9:23 AM  Follow-up for Phone Call        The pt called back because he wants to make sure that Dr Haroldine Laws knows another physician adjusted his medications.  I once again reviewed the pt's lab results and explained the reason why his meds were being adjusted. The pt is also pending wrist surgery and is taking Diclofenac and Hydrocodone/APAP as needed for pain.  The pt is not using these medications on a daily basis. I will forward this information to Dr Haroldine Laws to review. Follow-up by: Theodosia Quay, RN, BSN,  June 19, 2010 9:39 AM     Appended Document: Question about medication changes would avoid diclofenac.   Appended Document: Question about medication changes pt is concerned because he frequently has pain and inflamation and needs to know what he can take to help with this.  States Dr Haroldine Laws asked him to ask his primary care MD about what pain meds to take but when he does ask and the primary care RXs something Dr Haroldine Laws tells him not to take it.  Pt is very frustrated and asking for something for his discomfort.  Instructed pt I will send this information for review.

## 2010-12-26 NOTE — Cardiovascular Report (Signed)
Summary: Pre-Cath Orders  Pre-Cath Orders   Imported By: Marilynne Drivers 07/18/2010 16:39:50  _____________________________________________________________________  External Attachment:    Type:   Image     Comment:   External Document

## 2010-12-26 NOTE — Progress Notes (Signed)
   Request recieved from DDS sent to [place]  Apr 23, 2010 12:40 PM

## 2010-12-26 NOTE — Letter (Signed)
Summary: disability  Press [facility], [facility]  [address] [place]   [place], [place] [address]   Phone: [phone]  Fax: [phone]        November 12, 2009  RE: Braydyn Delarocha MRN: [id number]   To Whom It May Concern,  [name] is a 45 year old male whom I follow in our Cardiology clinic for severe biventricular heart failure. He was recently admitted for two weeks for his heart failure and required IV inotropic support. Given the severity of his heart failure, he will not be able to work for the forseeable future and may even require assessment for possible heart transplantation. Thus, I have suggested that he apply for disability and Medicaid. I appreciate your consideration in this matter. Please do not hesistate to contact me with any questions.    Sincerely,  Shaune Pascal Bensimhon, MDCo-director, Congestive Heart Failure and pulmonary Hypertension Clinics [facility] [phone]  This letter has been electronically signed by your physician.  Appended Document: disability pt aware to pick up

## 2010-12-26 NOTE — Procedures (Signed)
Summary: Cardiology Device Clinic    Allergies: No Known Drug Allergies   ICD Specifications Following MD:  Thompson Grayer, MD     Referring MD:  BENSIMHON ICD Vendor:  Medtronic     ICD Model Number:  [id number]     ICD Serial Number:  [id number] H ICD DOI:  11/20/2009     ICD Implanting MD:  Thompson Grayer, MD  Lead 1:    Location: RA     DOI: 11/20/2009     Model #: [id number]     Serial #: [id number]     Status: active Lead 2:    Location: RV     DOI: 11/20/2009     Model #: [id number]     Serial #: [id number]     Status: active Lead 3:    Location: LV     DOI: 11/20/2009     Model #: [id number]     Serial #[id number]     Status: active  Indications::  CM   ICD Follow Up Remote Check?  No Battery Voltage:  3.16 V     Charge Time:  9.2 seconds     Underlying rhythm:  Loletha Grayer ICD Dependent:  No       ICD Device Measurements Atrium:  Amplitude: 2.1 mV, Impedance: 475 ohms, Threshold: 0.5 V at 0.4 msec Right Ventricle:  Amplitude: 7.4 mV, Impedance: 342 ohms, Threshold: 1.0 V at 0.4 msec Left Ventricle:  Impedance: 418 ohms, Threshold: 0.375 V at 0.4 msec Configuration: LV TIP TO RV COIL Shock Impedance: 35/46 ohms   Episodes MS Episodes:  2     Percent Mode Switch:  <0.1%     Coumadin:  No Shock:  0     ATP:  0     Nonsustained:  4      Brady Parameters Mode DDD     Lower Rate Limit:  60     Upper Rate Limit 130 PAV 130     Sensed AV Delay:  100  Tachy Zones VF:  200     VT:  monitor 171-200     Next Cardiology Appt Due:  10/24/2010 Tech Comments:  No parameter change.  Optivol and thoracic impedance borderline abnormal.  Mr. Sokalski was seen today during his scheduled appt. with Dr. Haroldine Laws.  We will see him back in 3 months. Alma Friendly, LPN  September  6, 624THL 10:18 AM

## 2010-12-26 NOTE — Progress Notes (Signed)
Summary: refill med- pt will pick up at office/2nd call  Phone Note Refill Request Call back at Home Phone [phone] Message from:  Patient on November 22, 2010 9:48 AM  Refills Requested: Medication #1:  JANUMET 50-1000 MG TABS Take one tablet by mouth twice daily.  Method Requested: Pick up at Office Initial call taken by: Neil Crouch,  November 22, 2010 9:49 AM Caller: Patient  Follow-up for Phone Call        No samples of this in our office. cdm Follow-up by: Lauree Chandler, MD,  November 22, 2010 2:07 PM  Additional Follow-up for Phone Call Additional follow up Details #1::        pt wants to pick up a rescription for rx at the office. pt would like to talk to a nurse. 11/27/10--0900am--spoke to pt to get understanding about med (janumet) and from what i understand , he needs rx for med, but dr bensimhon not available until 1/5--evidently dr bensimhon will be made aware of problem and we will take care of prescribing janumet on 11/28/10--ny Additional Follow-up by: Regan Lemming,  November 26, 2010 3:40 PM    Additional Follow-up for Phone Call Additional follow up Details #2::    PT AWARE MD NOT I N OFFICE TODAY TO DISCUSS RE FILLING JANUMET. PAGED DR Julianne Handler BASED ON EARLIER MESSAGE WITH RESPONSE FROM DR Angelena Form FORTUNATELY PT SEES DR BENSIMHON NOT  IN OFFICE TIL 11/28/10 .WILL FORWARD TO DR BENSIMHON TO DISCUSS. Follow-up by: Devra Dopp, LPN,  January  3, [id number] 3:55 PM

## 2010-12-27 ENCOUNTER — Encounter: Payer: Self-pay | Admitting: Internal Medicine

## 2010-12-27 ENCOUNTER — Ambulatory Visit (HOSPITAL_COMMUNITY): Payer: Medicaid Other

## 2010-12-27 ENCOUNTER — Ambulatory Visit (INDEPENDENT_AMBULATORY_CARE_PROVIDER_SITE_OTHER): Payer: Medicaid Other | Admitting: Internal Medicine

## 2010-12-27 ENCOUNTER — Other Ambulatory Visit: Payer: Self-pay

## 2010-12-27 ENCOUNTER — Other Ambulatory Visit: Payer: Self-pay | Admitting: Internal Medicine

## 2010-12-27 ENCOUNTER — Inpatient Hospital Stay (HOSPITAL_COMMUNITY)
Admission: AD | Admit: 2010-12-27 | Discharge: 2010-12-30 | DRG: 292 | Disposition: A | Discharge status: Home / Self Care | Payer: Medicaid Other | Source: Ambulatory Visit | Attending: Internal Medicine | Admitting: Internal Medicine

## 2010-12-27 ENCOUNTER — Other Ambulatory Visit (INDEPENDENT_AMBULATORY_CARE_PROVIDER_SITE_OTHER): Payer: Medicaid Other

## 2010-12-27 ENCOUNTER — Ambulatory Visit: Admit: 2010-12-27 | Payer: Self-pay | Admitting: Internal Medicine

## 2010-12-27 DIAGNOSIS — E119 Type 2 diabetes mellitus without complications: Secondary | ICD-10-CM | POA: Diagnosis present

## 2010-12-27 DIAGNOSIS — I472 Ventricular tachycardia, unspecified: Secondary | ICD-10-CM | POA: Diagnosis present

## 2010-12-27 DIAGNOSIS — R197 Diarrhea, unspecified: Secondary | ICD-10-CM

## 2010-12-27 DIAGNOSIS — K219 Gastro-esophageal reflux disease without esophagitis: Secondary | ICD-10-CM

## 2010-12-27 DIAGNOSIS — I498 Other specified cardiac arrhythmias: Secondary | ICD-10-CM | POA: Diagnosis present

## 2010-12-27 DIAGNOSIS — N179 Acute kidney failure, unspecified: Secondary | ICD-10-CM | POA: Diagnosis present

## 2010-12-27 DIAGNOSIS — I5023 Acute on chronic systolic (congestive) heart failure: Secondary | ICD-10-CM

## 2010-12-27 DIAGNOSIS — I428 Other cardiomyopathies: Secondary | ICD-10-CM | POA: Diagnosis present

## 2010-12-27 DIAGNOSIS — I509 Heart failure, unspecified: Secondary | ICD-10-CM | POA: Diagnosis present

## 2010-12-27 DIAGNOSIS — I129 Hypertensive chronic kidney disease with stage 1 through stage 4 chronic kidney disease, or unspecified chronic kidney disease: Secondary | ICD-10-CM | POA: Diagnosis present

## 2010-12-27 DIAGNOSIS — I4729 Other ventricular tachycardia: Secondary | ICD-10-CM | POA: Diagnosis present

## 2010-12-27 DIAGNOSIS — N189 Chronic kidney disease, unspecified: Secondary | ICD-10-CM | POA: Diagnosis present

## 2010-12-27 LAB — CARBOXYHEMOGLOBIN
Carboxyhemoglobin: 1.5 % (ref 0.5–1.5)
Carboxyhemoglobin: 1.8 % — ABNORMAL HIGH (ref 0.5–1.5)
Methemoglobin: 0.8 % (ref 0.0–1.5)
Methemoglobin: 0.8 % (ref 0.0–1.5)
O2 Saturation: 71.9 %
O2 Saturation: 54.5 %
Total hemoglobin: 13.4 g/dL — ABNORMAL LOW (ref 13.5–18.0)
Total hemoglobin: 12.7 g/dL — ABNORMAL LOW (ref 13.5–18.0)

## 2010-12-27 LAB — BASIC METABOLIC PANEL
BUN: 76 mg/dL — ABNORMAL HIGH (ref 6–23)
CO2: 25 mEq/L (ref 19–32)
Calcium: 10 mg/dL (ref 8.4–10.5)
Chloride: 101 mEq/L (ref 96–112)
Creatinine, Ser: 4.3 mg/dL — ABNORMAL HIGH (ref 0.4–1.5)
GFR: 19.31 mL/min — ABNORMAL LOW (ref >60.00)
Glucose, Bld: 140 mg/dL — ABNORMAL HIGH (ref 70–99)
Potassium: 4.6 mEq/L (ref 3.5–5.1)
Sodium: 137 mEq/L (ref 135–145)

## 2010-12-27 LAB — BRAIN NATRIURETIC PEPTIDE: Pro B Natriuretic peptide (BNP): 207.4 pg/mL — ABNORMAL HIGH (ref 0.0–100.0)

## 2010-12-28 DIAGNOSIS — I509 Heart failure, unspecified: Secondary | ICD-10-CM

## 2010-12-28 LAB — BASIC METABOLIC PANEL
BUN: 72 mg/dL — ABNORMAL HIGH (ref 6–23)
CO2: 24 mEq/L (ref 19–32)
Calcium: 9.9 mg/dL (ref 8.4–10.5)
Chloride: 101 mEq/L (ref 96–112)
Creatinine, Ser: 2.8 mg/dL — ABNORMAL HIGH (ref 0.4–1.5)
GFR calc Af Amer: 30 mL/min — ABNORMAL LOW (ref >60)
GFR calc non Af Amer: 25 mL/min — ABNORMAL LOW (ref >60)
Glucose, Bld: 177 mg/dL — ABNORMAL HIGH (ref 70–99)
Potassium: 4.5 mEq/L (ref 3.5–5.1)
Sodium: 137 mEq/L (ref 135–145)

## 2010-12-28 LAB — CBC
HCT: 32.4 % — ABNORMAL LOW (ref 39.0–52.0)
Hemoglobin: 10.8 g/dL — ABNORMAL LOW (ref 13.0–17.0)
MCH: 28.7 pg (ref 26.0–34.0)
MCHC: 33.3 g/dL (ref 30.0–36.0)
MCV: 86.2 fL (ref 78.0–100.0)
Platelets: 119 10*3/uL — ABNORMAL LOW (ref 150–400)
RBC: 3.76 MIL/uL — ABNORMAL LOW (ref 4.22–5.81)
RDW: 16.4 % — ABNORMAL HIGH (ref 11.5–15.5)
WBC: 5.4 10*3/uL (ref 4.0–10.5)

## 2010-12-28 LAB — DIGOXIN LEVEL: Digoxin Level: 0.7 ng/mL — ABNORMAL LOW (ref 0.8–2.0)

## 2010-12-28 LAB — MAGNESIUM: Magnesium: 1.6 mg/dL (ref 1.5–2.5)

## 2010-12-29 LAB — BASIC METABOLIC PANEL
BUN: 53 mg/dL — ABNORMAL HIGH (ref 6–23)
CO2: 22 mEq/L (ref 19–32)
Calcium: 9.4 mg/dL (ref 8.4–10.5)
Chloride: 99 mEq/L (ref 96–112)
Creatinine, Ser: 1.75 mg/dL — ABNORMAL HIGH (ref 0.4–1.5)
GFR calc Af Amer: 51 mL/min — ABNORMAL LOW (ref >60)
GFR calc non Af Amer: 42 mL/min — ABNORMAL LOW (ref >60)
Glucose, Bld: 154 mg/dL — ABNORMAL HIGH (ref 70–99)
Potassium: 4.3 mEq/L (ref 3.5–5.1)
Sodium: 133 mEq/L — ABNORMAL LOW (ref 135–145)

## 2010-12-29 LAB — GLUCOSE, CAPILLARY
Glucose-Capillary: 212 mg/dL — ABNORMAL HIGH (ref 70–99)
Glucose-Capillary: 193 mg/dL — ABNORMAL HIGH (ref 70–99)
Glucose-Capillary: 207 mg/dL — ABNORMAL HIGH (ref 70–99)

## 2010-12-29 LAB — CARBOXYHEMOGLOBIN
Carboxyhemoglobin: 1.3 % (ref 0.5–1.5)
Methemoglobin: 0.5 % (ref 0.0–1.5)
O2 Saturation: 61.6 %
Total hemoglobin: 11.1 g/dL — ABNORMAL LOW (ref 13.5–18.0)

## 2010-12-29 LAB — MRSA PCR SCREENING: MRSA by PCR: NEGATIVE

## 2010-12-29 LAB — HEMOCCULT GUIAC POC 1CARD (OFFICE): Fecal Occult Bld: POSITIVE

## 2010-12-30 DIAGNOSIS — I5023 Acute on chronic systolic (congestive) heart failure: Secondary | ICD-10-CM

## 2010-12-30 LAB — BASIC METABOLIC PANEL
BUN: 51 mg/dL — ABNORMAL HIGH (ref 6–23)
CO2: 22 mEq/L (ref 19–32)
Calcium: 9 mg/dL (ref 8.4–10.5)
Chloride: 102 mEq/L (ref 96–112)
Creatinine, Ser: 1.7 mg/dL — ABNORMAL HIGH (ref 0.4–1.5)
GFR calc Af Amer: 53 mL/min — ABNORMAL LOW (ref >60)
GFR calc non Af Amer: 44 mL/min — ABNORMAL LOW (ref >60)
Glucose, Bld: 129 mg/dL — ABNORMAL HIGH (ref 70–99)
Potassium: 4.1 mEq/L (ref 3.5–5.1)
Sodium: 134 mEq/L — ABNORMAL LOW (ref 135–145)

## 2010-12-30 LAB — GLUCOSE, CAPILLARY
Glucose-Capillary: 187 mg/dL — ABNORMAL HIGH (ref 70–99)
Glucose-Capillary: 174 mg/dL — ABNORMAL HIGH (ref 70–99)

## 2010-12-30 LAB — CARBOXYHEMOGLOBIN
Carboxyhemoglobin: 1.5 % (ref 0.5–1.5)
Methemoglobin: 0.8 % (ref 0.0–1.5)
O2 Saturation: 58.9 %
Total hemoglobin: 11 g/dL — ABNORMAL LOW (ref 13.5–18.0)

## 2010-12-31 ENCOUNTER — Encounter: Payer: Self-pay | Admitting: Internal Medicine

## 2011-01-01 NOTE — Letter (Signed)
Summary: Return To Work  [employer] [place]  [address] [place]   [place], [place] [address]   Phone: [phone]  Fax: [phone]    12/27/2010  TO: WHOM IT MAY CONCERN   RE: David Gregory [place]   The above named individual is under my medical care and may resume work with no restrictions and rest breaks as needed.  If you have any further questions or need additional information, please call.     Sincerely,    Glori Bickers

## 2011-01-01 NOTE — Miscellaneous (Signed)
Summary: Orders Update  Clinical Lists Changes  Orders: Added new Test order of T-Nicotine Assay & Metabolites, Serum [phone]) - Signed

## 2011-01-01 NOTE — Progress Notes (Signed)
Summary: [facility]-pt missed dose-fyi  Phone Note From Pharmacy   Caller: [facility] [phone] kerry epps Summary of Call: pt has missed dose of med last night, kerry checked on him at [place] this morning and gave him his med, vitals are stable, pt was instructed on the importance to taking each dose-fyi pls call if any questions Initial call taken by: Lorenda Hatchet,  December 25, 2010 9:44 AM  Follow-up for Phone Call        I spoke with Levada Dy and she said the pt's Milirinone ran out last night about 10 PM and the pt just turned pump off.  The pt called [facility] this morning at 8 to let them know that his medication ran out.  Levada Dy evaluated the pt and educated the pt on the importance on med and notifying [facility] when there is a problem with IV.  The pt's weight is down to 192 today, no sob, bp good, pulse 86, the pt does have 2 plus edema.  The pt is scheduled to see Dr Haroldine Laws on 12/27/10.  BMP drawn by [facility] and they will fax results.  Follow-up by: Theodosia Quay, RN, BSN,  December 25, 2010 10:25 AM     Appended Document: [facility] missed dose Thanks. Is he stilltaking extra diuretic? His weight is still up so I would keep him on increased dose as long as Cr < 2.0 thanks -dan

## 2011-01-01 NOTE — Assessment & Plan Note (Signed)
Summary: eph/hms   Vital Signs:  Patient profile:   45 year old male Height:      64 inches Weight:      202 pounds BMI:     34.80 Pulse rate:   101 / minute  Vitals Entered By: Mignon Pine, RMA (December 27, 2010 10:30 AM)  Visit Type:  Follow-up Primary Provider:  [name]  CC:  edema.  History of Present Illness: David Gregory is a 45 year old male with h/o CHF due to nonischemic cardiomyopathy iniital EF 15-25%.  He also has a history of HTN, DM, CRI, previous tobacco and alcohol use (now abstinent since 12/10).   He was hospitalized in 12/10 with low output state and was found to have worsening of his LV function with an EF of 15%.  He required inotrope support. Since that time he has underwent placement of a BiVICD and had been doing fairly well.   ACE-I dose previously limited by hyperkalemia. Also reason he is no longer spironolactone. Remains on hydralazine but nor on Imdur due to using Viagra.   CPX (10/11): pVO2 12.6 (40%) Slope 51 RER 1.13 O2 pulse 53%    I saw him in clinic on December 05, 2010, and felt that he had worsening HF.  He underwent an outpatient RHC on December 11, 2010.  This showed severe biventricular HF with low output. RA 26 PA 61/28 (42) wedge 23 with a V-wave of 27.  Fick 2.7/1.4 PVR  6.9 Woods units.  PA sats were 40% and 45%.Based on the results of catheterization - admitted and started on milrinone 0.375 mcg/kg/minute and Adcirca 20 mg a day. He progressed quite nicely.  His creatinine came down from 2.5 to 1.36. Renal u/sshowed normal kidneys with normal  Repeat RHC 12/20/10 showed marked improvement on milrinone. RA 18 PA pressure of 39/26 mean 32. PCWP 21  PA saturations on room air were 53% and 54%. Fick 3.9/2.0. PVR 2.8. D/c'd home on milrinone and ACE-i restarted.   Over this week has had significant edema with worsening renal fx with cr up to 2.7. Also notes frequent runny diarrhea 6-8x/day. Weight up 5-8 pounds. ACE-i and spiro stopped. +orthopnea.     Current Medications (verified): 1)  Carvedilol 12.5 Mg Tabs (Carvedilol) .... Take One Tablet By Mouth Twice A Day 2)  Torsemide 20 Mg Tabs (Torsemide) .... Take One Tablet By Mouth Once Daily 3)  Janumet 50-1000 Mg Tabs (Sitagliptin-Metformin Hcl) .... Take One Tablet By Mouth Twice Daily. 4)  Glucosamine Sulfate   Powd (Glucosamine Sulfate) .... Two Times A Day 5)  Allopurinol 100 Mg Tabs (Allopurinol) .... 2 Tabs Once Daily 6)  Hydralazine Hcl 50 Mg Tabs (Hydralazine Hcl) .... Take One Tablet By Mouth Three Times A Day 7)  Accupril 5 Mg Tabs (Quinapril Hcl) .... Take 1 Tablet By Mouth Two Times A Day 8)  Viagra 100 Mg Tabs (Sildenafil Citrate) .... Take One Tablet As Needed As Directed 9)  Milrinone .... Iv 10)  Amaryl 4 Mg Tabs (Glimepiride) .... Once Daily  Allergies (verified): No Known Drug Allergies  Physical Exam  General:  Alert, No acute distress. Respirations unlabored. HEENT: normal Neck: supple. JVP elevated at least to jaw Carotids 2+ bilat; no bruits. No lymphadenopathy or thryomegaly appreciated. Cor: PMI laterally displaced.Regular tachy. prominent s3. No rubs.  2/6 sem Lungs: clear Abdomen:+Distended Non tender. Good bowel sounds. Extremities: no cyanosis, clubbing, rash. tr-1 edema Neuro: alert & orientedx3, cranial nerves grossly intact. moves all 4 extremities w/o  difficulty. affect pleasant    Impression & Recommendations:  Problem # 1:  SYSTOLIC HEART FAILURE, ACUTE ON CHRONIC (ICD-428.23) I am very worried about him. His presentation of worsening edema in the setting of deteriorating function is concerning to me for low output despite the recent initiation of milrinone. Will check labs today including BMET, BNP and co-ox. If renal function is getting worse will admit for futher evaluation. Continue to hold lisinopril and spironolactone. Continue demadex at current dose.   Problem # 2:  Diarrhea Will check c.diff, O&P and stool cultures.   Problem # 3:   GERD Start portonix 40 once daily.  Other Orders: EKG w/ Interpretation (93000) TLB-BMP (Basic Metabolic Panel-BMET) (99991111) TLB-BNP (B-Natriuretic Peptide) (83880-BNPR) Stool C-Diff toxin assay- [place] [id number]) T-Stool Culture [id number]) T-Stool for O&P [id number])  Patient Instructions: 1)  Start Protonix 40mg  daily 2)  Labs today 3)  Go to [place] at [facility] adn give them lab order. 4)  Go to [facility]. [place] [facility] to get stool cups 5)  Follow up in 1 week--01/02/11 9:45 Prescriptions: PROTONIX 40 MG SOLR (PANTOPRAZOLE SODIUM) Take 1 tablet by mouth once a day  #30 x 6   Entered by:   [name], RN   Authorized by:   Jolaine Artist, MD, [facility]   Signed by:   [name], RN on 12/27/2010   Method used:   Electronically to        [facility] [address] (retail)       [address]       [place], [place]  [address]       Ph: [phone]       Fax: [phone]   RxID:   [id number]    Orders Added: 1)  EKG w/ Interpretation [93000] 2)  TLB-BMP (Basic Metabolic Panel-BMET) 123456 3)  TLB-BNP (B-Natriuretic Peptide) [83880-BNPR] 4)  Stool C-Diff toxin assay- [place] [id number] 5)  T-Stool Culture [id number])  T-Stool for O&P [id number]

## 2011-01-02 ENCOUNTER — Inpatient Hospital Stay (HOSPITAL_COMMUNITY)
Admission: AD | Admit: 2011-01-02 | Discharge: 2011-01-03 | DRG: 292 | Disposition: A | Discharge status: Other Acute Inpatient Hospital | Payer: Medicaid Other | Source: Ambulatory Visit | Attending: Internal Medicine | Admitting: Internal Medicine

## 2011-01-02 ENCOUNTER — Ambulatory Visit (INDEPENDENT_AMBULATORY_CARE_PROVIDER_SITE_OTHER): Payer: Medicaid Other | Admitting: Internal Medicine

## 2011-01-02 ENCOUNTER — Encounter: Payer: Self-pay | Admitting: Internal Medicine

## 2011-01-02 DIAGNOSIS — I5023 Acute on chronic systolic (congestive) heart failure: Secondary | ICD-10-CM

## 2011-01-02 DIAGNOSIS — R000 Tachycardia, unspecified: Secondary | ICD-10-CM | POA: Diagnosis present

## 2011-01-02 DIAGNOSIS — N183 Chronic kidney disease, stage 3 unspecified: Secondary | ICD-10-CM | POA: Diagnosis present

## 2011-01-02 DIAGNOSIS — R079 Chest pain, unspecified: Secondary | ICD-10-CM

## 2011-01-02 DIAGNOSIS — D649 Anemia, unspecified: Secondary | ICD-10-CM | POA: Diagnosis present

## 2011-01-02 DIAGNOSIS — E119 Type 2 diabetes mellitus without complications: Secondary | ICD-10-CM | POA: Diagnosis present

## 2011-01-02 DIAGNOSIS — N189 Chronic kidney disease, unspecified: Secondary | ICD-10-CM | POA: Diagnosis present

## 2011-01-02 DIAGNOSIS — R0602 Shortness of breath: Secondary | ICD-10-CM

## 2011-01-02 DIAGNOSIS — I428 Other cardiomyopathies: Secondary | ICD-10-CM | POA: Diagnosis present

## 2011-01-02 LAB — COMPREHENSIVE METABOLIC PANEL
ALT: 17 U/L (ref 0–53)
AST: 23 U/L (ref 0–37)
Albumin: 3.8 g/dL (ref 3.5–5.2)
Alkaline Phosphatase: 117 U/L (ref 39–117)
BUN: 47 mg/dL — ABNORMAL HIGH (ref 6–23)
CO2: 20 mEq/L (ref 19–32)
Calcium: 9 mg/dL (ref 8.4–10.5)
Chloride: 102 mEq/L (ref 96–112)
Creatinine, Ser: 1.77 mg/dL — ABNORMAL HIGH (ref 0.4–1.5)
GFR calc Af Amer: 51 mL/min — ABNORMAL LOW (ref >60)
GFR calc non Af Amer: 42 mL/min — ABNORMAL LOW (ref >60)
Glucose, Bld: 211 mg/dL — ABNORMAL HIGH (ref 70–99)
Potassium: 3.9 mEq/L (ref 3.5–5.1)
Sodium: 134 mEq/L — ABNORMAL LOW (ref 135–145)
Total Bilirubin: 1.4 mg/dL — ABNORMAL HIGH (ref 0.3–1.2)
Total Protein: 7.5 g/dL (ref 6.0–8.3)

## 2011-01-02 LAB — CBC
HCT: 32.6 % — ABNORMAL LOW (ref 39.0–52.0)
Hemoglobin: 10.9 g/dL — ABNORMAL LOW (ref 13.0–17.0)
MCH: 28.5 pg (ref 26.0–34.0)
MCHC: 33.4 g/dL (ref 30.0–36.0)
MCV: 85.1 fL (ref 78.0–100.0)
Platelets: 165 10*3/uL (ref 150–400)
RBC: 3.83 MIL/uL — ABNORMAL LOW (ref 4.22–5.81)
RDW: 16.6 % — ABNORMAL HIGH (ref 11.5–15.5)
WBC: 4.4 10*3/uL (ref 4.0–10.5)

## 2011-01-02 LAB — T4, FREE: Free T4: 1.34 ng/dL (ref 0.80–1.80)

## 2011-01-02 LAB — T3, FREE: T3, Free: 2.6 pg/mL (ref 2.3–4.2)

## 2011-01-02 LAB — GLUCOSE, CAPILLARY: Glucose-Capillary: 189 mg/dL — ABNORMAL HIGH (ref 70–99)

## 2011-01-02 LAB — TSH: TSH: 2.674 u[IU]/mL (ref 0.350–4.500)

## 2011-01-02 LAB — BRAIN NATRIURETIC PEPTIDE: Pro B Natriuretic peptide (BNP): 552 pg/mL — ABNORMAL HIGH (ref 0.0–100.0)

## 2011-01-02 LAB — MRSA PCR SCREENING: MRSA by PCR: NEGATIVE

## 2011-01-03 DIAGNOSIS — I5023 Acute on chronic systolic (congestive) heart failure: Secondary | ICD-10-CM

## 2011-01-03 LAB — GLUCOSE, CAPILLARY
Glucose-Capillary: 178 mg/dL — ABNORMAL HIGH (ref 70–99)
Glucose-Capillary: 217 mg/dL — ABNORMAL HIGH (ref 70–99)
Glucose-Capillary: 175 mg/dL — ABNORMAL HIGH (ref 70–99)
Glucose-Capillary: 150 mg/dL — ABNORMAL HIGH (ref 70–99)

## 2011-01-03 LAB — BASIC METABOLIC PANEL
BUN: 48 mg/dL — ABNORMAL HIGH (ref 6–23)
CO2: 20 mEq/L (ref 19–32)
Calcium: 9 mg/dL (ref 8.4–10.5)
Chloride: 104 mEq/L (ref 96–112)
Creatinine, Ser: 1.65 mg/dL — ABNORMAL HIGH (ref 0.4–1.5)
GFR calc Af Amer: 55 mL/min — ABNORMAL LOW (ref >60)
GFR calc non Af Amer: 45 mL/min — ABNORMAL LOW (ref >60)
Glucose, Bld: 200 mg/dL — ABNORMAL HIGH (ref 70–99)
Potassium: 3.9 mEq/L (ref 3.5–5.1)
Sodium: 134 mEq/L — ABNORMAL LOW (ref 135–145)

## 2011-01-03 LAB — CARBOXYHEMOGLOBIN
Carboxyhemoglobin: 1.5 % (ref 0.5–1.5)
Methemoglobin: 0.8 % (ref 0.0–1.5)
O2 Saturation: 71.4 %
Total hemoglobin: 10.9 g/dL — ABNORMAL LOW (ref 13.5–18.0)

## 2011-01-03 LAB — BRAIN NATRIURETIC PEPTIDE: Pro B Natriuretic peptide (BNP): 682 pg/mL — ABNORMAL HIGH (ref 0.0–100.0)

## 2011-01-09 NOTE — Assessment & Plan Note (Signed)
Summary: f1w/per Nira Conn S./jml   Visit Type:  Follow-up Primary Provider:  [name]  CC:  shortness of breath and some chest pain.  History of Present Illness: David Gregory is a 45 year old male with h/o CHF due to nonischemic cardiomyopathy  EF 15-25% s/p BiVICD.  He also has a history of HTN, DM, CRI, previous tobacco and alcohol use (now abstinent since 12/10).   ACE-I dose and spiro previously limited by hyperkalemia. Remains on hydralazine but nor on Imdur due to PDE-5 inhibitor therapy.   CPX (10/11): pVO2 12.6 (40%) Slope 51 RER 1.13 O2 pulse 53%    Underwent  outpatient RHC on December 11, 2010 for increasing sx.  This showed severe biventricular HF with low output. RA 26 PA 61/28 (42) wedge 23 with a V-wave of 27.  Fick 2.7/1.4 PVR  6.9 Woods units.  PA sats were 40% and 45%.Based on the results of catheterization - admitted and started on milrinone 0.375 mcg/kg/minute and Adcirca 20 mg a day. He progressed quite nicely.  His creatinine came down from 2.5 to 1.36. Renal u/s showed normal kidneys with normal  Repeat RHC 12/20/10 showed marked improvement on milrinone and Adirca. RA 18 PA pressure of 39/26 mean 32. PCWP 21  PA saturations on room air were 53% and 54%. Fick 3.9/2.0. PVR 2.8. D/c'd home on milrinone and ACE-I restarted.   Admitted last week for complaints of LE edema and worsening renal fx. CR 4.3. Admitted. Diuretics held and renal function came back to baseline. Swelling thought possibly due to recent inititation of Adcirca. Home demadex decreased to once daily and ACE-I held. Returns today for f/u.   Feels terrible. C/o severe DOE, edema and orthopnea.  Weight up about 7 pounds.  Has started taking demadex two times a day without help. Hasn't tried metolazone. Schedule d to see Dr. Jerilee Hoh at [facility] transplant clinic February 15.   Problems Prior to Update: 1)  Diarrhea  (ICD-787.91) 2)  Chest Pain Unspecified  (ICD-786.50) 3)  Carpal Tunnel Syndrome  (ICD-354.0) 4)   Ventricular Tachycardia  (99991111) 5)  Systolic Heart Failure, Acute On Chronic  (99991111) 6)  Systolic Heart Failure, Acute On Chronic  (ICD-428.23) 7)  Gout, Unspecified  (ICD-274.9) 8)  Gout With Other Specified Manifestations  (ICD-274.89) 9)  Erectile Dysfunction, Organic  (ICD-607.84) 10)  Tobacco Abuse  (0000000) 11)  Systolic Heart Failure, Chronic  (ICD-428.22) 12)  Congestive Heart Failure Unspecified  (ICD-428.0) 13)  Edema  (ICD-782.3)  Medications Prior to Update: 1)  Carvedilol 12.5 Mg Tabs (Carvedilol) .... Take One Tablet By Mouth Twice A Day 2)  Torsemide 20 Mg Tabs (Torsemide) .... Take One Tablet By Mouth Once Daily 3)  Janumet 50-1000 Mg Tabs (Sitagliptin-Metformin Hcl) .... Take One Tablet By Mouth Twice Daily. 4)  Glucosamine Sulfate   Powd (Glucosamine Sulfate) .... Two Times A Day 5)  Allopurinol 100 Mg Tabs (Allopurinol) .... 2 Tabs Once Daily 6)  Hydralazine Hcl 50 Mg Tabs (Hydralazine Hcl) .... Take One Tablet By Mouth Three Times A Day 7)  Accupril 5 Mg Tabs (Quinapril Hcl) .... Take 1 Tablet By Mouth Two Times A Day 8)  Viagra 100 Mg Tabs (Sildenafil Citrate) .... Take One Tablet As Needed As Directed 9)  Milrinone .... Iv 10)  Amaryl 4 Mg Tabs (Glimepiride) .... Once Daily 11)  Protonix 40 Mg Solr (Pantoprazole Sodium) .... Take 1 Tablet By Mouth Once A Day  Current Medications (verified): 1)  Carvedilol 12.5 Mg Tabs (Carvedilol) .... Take  One Tablet By Mouth Twice A Day 2)  Glucosamine Sulfate   Powd (Glucosamine Sulfate) .... Take 1 Tablet By Mouth Once A Day 3)  Allopurinol 100 Mg Tabs (Allopurinol) .... 2 Tabs Once Daily 4)  Hydralazine Hcl 50 Mg Tabs (Hydralazine Hcl) .... Take One Tablet By Mouth Three Times A Day 5)  Milrinone .... Iv 6)  Amaryl 4 Mg Tabs (Glimepiride) .... Once Daily 7)  Adcirca 20 Mg Tabs (Tadalafil (Pah)) .... Daily 8)  Demadex 20 Mg Tabs (Torsemide) .... Daily, Also As Needed To Keep Weight Around 193 9)  Digoxin 0.25  Mg Tabs (Digoxin) .... Daily  Allergies (verified): No Known Drug Allergies  Past History:  Past Medical History: CHF due to NICM    --normal cors cath 2006    --multiple admissions for low output HF - outpatient milrionone started Jan 2012    --last echo 12/10. EF 15-20%. Restrictive physiology    --s/p BiVICd    --CPX (10/11): pVO2 12.6 (40%) Slope 51 RER 1.13 O2 pulse 53%  HTN DM2 LBBB Gout h/o Tobacco & ETOH use - quit since December 2010 (all cotinine tests negative)  Family History: Reviewed history from 10/25/2009 and no changes required.  Positive for hypertension and diabetes.   Social History: Reviewed history from 07/14/2009 and no changes required.   Previously worked as a Dealer.  Now on disability. Previous tobaccoaand ETOH use quit 2010.     Review of Systems       As per HPI and past medical history; otherwise all systems negative.   Vital Signs:  Patient profile:   45 year old male Height:      64 inches Weight:      209.50 pounds BMI:     36.09 Pulse rate:   111 / minute BP sitting:   110 / 78  (left arm) Cuff size:   regular  Vitals Entered By: Marlis Edelson CNA (January 02, 2011 9:50 AM)  Physical Exam  General:  Tachypneic and dyspneic at rest HEENT: normal Neck: supple. JVP elevated at least to jaw Carotids 2+ bilat; no bruits. No lymphadenopathy or thryomegaly appreciated. Cor: PMI laterally displaced.Regular tachy. prominent s3. No rubs.  2/6 sem LSB Lungs: clear Abdomen:+Distended Non tender. Good bowel sounds. Extremities: no cyanosis, clubbing, rash. 2-3+ edema Neuro: alert & orientedx3, cranial nerves grossly intact. moves all 4 extremities w/o difficulty. affect pleasant     ICD Specifications Following MD:  Thompson Grayer, MD     Referring MD:  [facility] ICD Vendor:  Medtronic     ICD Model Number:  [id number]     ICD Serial Number:  [id number] ICD DOI:  11/20/2009     ICD Implanting MD:  Thompson Grayer, MD  Lead 1:     Location: RA     DOI: 11/20/2009     Model #: [id number]     Serial #: [id number]     Status: active Lead 2:    Location: RV     DOI: 11/20/2009     Model #: [id number]     Serial #: [id number]     Status: active Lead 3:    Location: LV     DOI: 11/20/2009     Model #: [id number]     Serial #: [id number]     Status: active  Indications::  CM   ICD Follow Up ICD Dependent:  No       ICD Device Measurements Configuration: LV TIP TO RV COIL  Episodes  Coumadin:  No  Brady Parameters Mode DDD     Lower Rate Limit:  60     Upper Rate Limit 130 PAV 130     Sensed AV Delay:  100  Tachy Zones VF:  200     VT:  monitor 171-200     Impression & Recommendations:  Problem # 1:  SYSTOLIC HEART FAILURE, ACUTE ON CHRONIC (ICD-428.23) He is clearly getting worse despite inotropic therapy and compliance with his medical regimen. He is more tachycardic and we are unable to maintain euvolemia without significant renal dysfunction. I  think we have reached the point where he needs to be considered for advanced therapies. We will admit him and start IV diuresis. I have discussed the case with Dr. Jerilee Hoh at [facility] and we will transfer there for evalaution.

## 2011-01-13 ENCOUNTER — Telehealth (INDEPENDENT_AMBULATORY_CARE_PROVIDER_SITE_OTHER): Payer: Self-pay | Admitting: *Deleted

## 2011-01-14 ENCOUNTER — Ambulatory Visit: Payer: Self-pay | Admitting: Internal Medicine

## 2011-01-15 ENCOUNTER — Telehealth (INDEPENDENT_AMBULATORY_CARE_PROVIDER_SITE_OTHER): Payer: Self-pay | Admitting: *Deleted

## 2011-01-15 NOTE — Cardiovascular Report (Signed)
Summary: Office Visit Remote   Office Visit Remote   Imported By: Sallee Provencal 01/07/2011 14:37:46  _____________________________________________________________________  External Attachment:    Type:   Image     Comment:   External Document

## 2011-01-15 NOTE — Procedures (Signed)
Summary: Cardiology Device Clinic   Current Medications (verified): 1)  Carvedilol 12.5 Mg Tabs (Carvedilol) .... Take One Tablet By Mouth Twice A Day 2)  Torsemide 20 Mg Tabs (Torsemide) .... Take One Tablet By Mouth Once Daily 3)  Janumet 50-1000 Mg Tabs (Sitagliptin-Metformin Hcl) .... Take One Tablet By Mouth Twice Daily. 4)  Glucosamine Sulfate   Powd (Glucosamine Sulfate) .... Two Times A Day 5)  Allopurinol 100 Mg Tabs (Allopurinol) .... 2 Tabs Once Daily 6)  Hydralazine Hcl 50 Mg Tabs (Hydralazine Hcl) .... Take One Tablet By Mouth Three Times A Day 7)  Accupril 5 Mg Tabs (Quinapril Hcl) .... Take 1 Tablet By Mouth Two Times A Day 8)  Viagra 100 Mg Tabs (Sildenafil Citrate) .... Take One Tablet As Needed As Directed 9)  Milrinone .... Iv 10)  Amaryl 4 Mg Tabs (Glimepiride) .... Once Daily 11)  Protonix 40 Mg Solr (Pantoprazole Sodium) .... Take 1 Tablet By Mouth Once A Day  Allergies (verified): No Known Drug Allergies   ICD Specifications Following MD:  Thompson Grayer, MD     Referring MD:  BENSIMHON ICD Vendor:  [employer]tronic     ICD Model Number:  [id number]     ICD Serial Number:  [id number] ICD DOI:  11/20/2009     ICD Implanting MD:  Thompson Grayer, MD  Lead 1:    Location: RA     DOI: 11/20/2009     Model #: [id number]     Serial #: [id number]     Status: active Lead 2:    Location: RV     DOI: 11/20/2009     Model #: [id number]     Serial #: [id number]     Status: active Lead 3:    Location: LV     DOI: 11/20/2009     Model #: [id number]     Serial #[id number]     Status: active  Indications::  CM   ICD Follow Up Battery Voltage:  3.13 V     Charge Time:  9.5 seconds     Underlying rhythm:  SR @ 103 ICD Dependent:  No       ICD Device Measurements Atrium:  Amplitude: 3.0 mV, Impedance: 475 ohms, Threshold: 0.50 V at 0.40 msec Right Ventricle:  Amplitude: 5.9 mV, Impedance: 342 ohms, Threshold: 0.50 V at 0.40 msec Left Ventricle:  Impedance: 437 ohms, Threshold: 0.50 V at 0.40  msec Configuration: LV TIP TO RV COIL Shock Impedance: 35/42 ohms   Episodes MS Episodes:  10     Coumadin:  No Shock:  0     ATP:  0     Nonsustained:  10     Atrial Therapies:  0 Atrial Pacing:  1.0%     Ventricular Pacing:  99.7%  Brady Parameters Mode DDD     Lower Rate Limit:  60     Upper Rate Limit 130 PAV 130     Sensed AV Delay:  100  Tachy Zones VF:  200     VT:  monitor 171-200     Next Cardiology Appt Due:  03/25/2011 Tech Comments:  10 NST EPISODES.  NORMAL DEVICE FUNCTION. NO CHANGES MADE. ROV IN [facility] W/DEVICE CLINIC. Shelly Bombard  December 29, 2010 12:13 PM

## 2011-01-15 NOTE — Letter (Signed)
Summary: Certificate of Medical Necessity   Certificate of Medical Necessity   Imported By: Sallee Provencal 01/09/2011 11:46:57  _____________________________________________________________________  External Attachment:    Type:   Image     Comment:   External Document

## 2011-01-15 NOTE — Discharge Summary (Signed)
  [name], [name]               ACCOUNT NO.:  [id number]  MEDICAL RECORD NO.:  [id number]           PATIENT TYPE:  I  LOCATION:  2921                         FACILITY:  [facility]  PHYSICIAN:  Shaune Pascal. Khaidyn Staebell, MDDATE OF BIRTH:  07/07/1966  DATE OF ADMISSION:  12/27/2010 DATE OF DISCHARGE:  12/30/2010                              DISCHARGE SUMMARY   DISCHARGE DIAGNOSES: 1. Acute on chronic renal failure secondary to over diuresis. 2. Advanced congestive heart failure secondary to nonischemic     cardiomyopathy, currently on milrinone. 3. Nonsustained ventricular tachycardia.  David Gregory is a 45 year old male well-known to me from the [facility].  He has advanced congestive heart failure secondary to nonischemic cardiomyopathy.  He was recently started on home inotropes due to a low output state.  We also started him on Adcirca due to pulmonary hypertension and in an effort to lower his pulmonary vascular resistance.  He has pending evaluation with Dr. Dorien Chihuahua at the [facility] later this month.  He was discharged from the hospital about a week or two ago after initiating home inotropes.  He has called the office shortly thereafter complaining of increased swelling.  His diuretics were increased. Unfortunately, he developed progressive renal insufficiency bumping his creatinine from 1.5 progressively to 4.3.  Given his complaint, it was unclear if he was volume overloaded or actually volume depleted.  We brought him in to the hospital.  His diuretics, ACE inhibitor, were held.  His beta-blocker was cut.  His Coreg was cut in half.  He cooperated nicely and his creatinine came back to near baseline at 1.7. His Demadex was started at half the previous dose now at 20 mg a day. His weight in the hospital came down to 89 kg, which apparently is his new baseline.  While in the hospital, he also had a 30-beat run of nonsustained VT which broke spontaneously.   Co-oximetry in the hospital was in the 59-62% range, which was up from his pre-inotrope values in the mid 40s.  On the day of discharge, he felt well.  He was ambulating in his room without any difficulty.  He did have persistent mild sinus tachycardia.  MEDICATIONS ON DISCHARGE: 1. Coreg 12.5 b.i.d. 2. Milrinone 0.375 mcg/kg per minute by continuous infusion. 3. Adcirca 20 mg a day. 4. Hydralazine 50 mg t.i.d. 5. Demadex 20 mg a day, he will take an extra tablet as needed to keep     his weight around 193 pounds. 6. Amaryl 4 mg a day. 7. Allopurinol 100 b.i.d. 8. Potassium 20 a day. 9. Digoxin 0.25 a day. 10.Glucosamine 1 tablet a day.  He will have followup labs on this Thursday including a BMET, CBC, iron, ferritin, iron saturation, TSH, T4, and T3.  I will see him back in the heart failure clinic within 1 week.     Shaune Pascal. Flora Parks, MD     DRB/MEDQ  D:  12/30/2010  T:  12/30/2010  Job:  [id number]  Electronically Signed by Glori Bickers MD on 01/15/2011 01:53:05 PM

## 2011-01-15 NOTE — Discharge Summary (Signed)
NAMEEDWARDO, David Gregory               ACCOUNT NO.:  [id number]  MEDICAL RECORD NO.:  [id number]           PATIENT TYPE:  I  LOCATION:  2921                         FACILITY:  [facility]  PHYSICIAN:  Shaune Pascal. Lynnda Wiersma, MDDATE OF BIRTH:  24-Nov-1966  DATE OF ADMISSION:  01/02/2011 DATE OF DISCHARGE:  01/03/2011                              DISCHARGE SUMMARY   DISCHARGE DIAGNOSES: 1. Acute on chronic systolic heart failure with volume overload. 2. Nonischemic cardiomyopathy with ejection fraction of 28% status     post by Bi-V ICD. 3. Diabetes. 4. Chronic renal insufficiency, baseline creatinine about 1.7.  BRIEF HISTORY OF PRESENT ILLNESS AND HOSPITAL COURSE:  David Gregory is a 45- year-old male with history of congestive heart failure secondary to nonischemic cardiomyopathy with ejection fraction about 20%.  He is status post by Bi-V ICD.  He was recently admitted for low output heart failure for which he was started on milrinone with marked improvement in his hemodynamics.  Unfortunately, he has struggled with volume overload and progressive tachycardia complicated by the cardiorenal syndrome.  He was recently admitted due to worsening renal function with the creatinine that peaked at 4.3.  We cut his diuretics back from Demadex 20 b.i.d. to once a day.  He subsequently presented to the office several days later with significant volume overload and class IV symptoms.  He was hospitalized once again for IV diuresis.  We started him on Lasix 80 IV b.i.d. and his diuresis was very sluggish.  We did check a cooximetry, which was 71% suggesting adequate cardiac output. Given his progressive difficulties managing his volume and his renal dysfunction, it was felt that he had reached the point where he need to be considered for advanced therapies.  I discussed this with Dr. Dorien Gregory at [facility] who agrees.  His recent hospital course has been well outlined in my clinic note of January 02, 2011.  On January 03, 2011, we notified that he had a bed available at [facility] and he is being prepared for transport.  At the time of discharge, blood pressure was 96/72 with a heart rate about 105.  His weight was 90.6 kg, which was down from 93 kg.  His baseline weight runs between 86 and 89 kilos.  MEDICINES ON DISCHARGE: 1. Lasix 80 mg IV t.i.d. 2. Hydralazine 50 mg p.o. t.i.d. 3. Carvedilol 12.5 mg p.o. b.i.d. 4. Digoxin 0.25 mg a day. 5. Allopurinol 200 a day. 6. Glimepiride 4 mg a day. 7. Adcirca 20 mg a day. 8. Milrinone 0.375 mcg/kg per minute. 9. Glucosamine.  Of note, he has not been on an ACE inhibitor or spironolactone due to worsening renal function and hyperkalemia.  LABORATORIES ON DISCHARGE:  Sodium 134, potassium 3.9, chloride 104, bicarb of 20, BUN of 48, creatinine 1.65 down from 1.77, BNP was 682 from 552.  White count 4.4, hemoglobin 10.9, platelets of 165.  His thyroid panel was normal.  Cooximetry blood gas showed an oxygen saturation of 71%.  FOLLOWUP:  He is being transferred to [facility] today for evaluation by the transplant team.  He will follow up with  me in the very near future as dictated by the transplant team.  We appreciate their efforts and help with David Gregory.  Total time of discharge encounter including all paperwork was 40 minutes.     Shaune Pascal. Walsie Smeltz, MD     DRB/MEDQ  D:  01/03/2011  T:  01/03/2011  Job:  [id number]  Electronically Signed by Glori Bickers MD on 01/15/2011 01:53:12 PM

## 2011-01-21 NOTE — Progress Notes (Signed)
  Phone Note Other Incoming   Request: Send information Summary of Call: Recieved Request for records from [place]. 2 pages forwarded to [employer].

## 2011-01-21 NOTE — Progress Notes (Signed)
  [employer] Request received sent to [place]  January 13, 2011 2:15 PM

## 2011-01-22 ENCOUNTER — Telehealth: Payer: Self-pay | Admitting: Internal Medicine

## 2011-01-30 NOTE — Letter (Signed)
Summary: [place] By: Marilynne Drivers 01/23/2011 15:47:35  _____________________________________________________________________  External Attachment:    Type:   Image     Comment:   External Document

## 2011-01-30 NOTE — Letter (Signed)
Summary: [facility] Enrollment Form  [facility] Enrollment Form   Imported By: Marilynne Drivers 01/23/2011 15:13:56  _____________________________________________________________________  External Attachment:    Type:   Image     Comment:   External Document

## 2011-01-30 NOTE — Progress Notes (Signed)
Summary: pt wants a call from dr h tomorrow  Phone Note Call from Patient   Caller: Patient [phone] Reason for Call: Talk to Nurse Summary of Call: pt wants dr Callen Zuba to call him at [facility] tomorrow Initial call taken by: Lorenda Hatchet,  January 22, 2011 1:44 PM  Follow-up for Phone Call        pt aware Dr Haroldine Laws out of office, will send him mess [name], RN  January 22, 2011 4:07 PM   Additional Follow-up for Phone Call Additional follow up Details #1::        will call Jolaine Artist, MD, [facility]  January 23, 2011 7:15 PM

## 2011-02-04 NOTE — Miscellaneous (Signed)
Summary: [facility] Presc[facility] Orders   [facility] Orders   Imported By: Sallee Provencal 01/30/2011 15:31:37  _____________________________________________________________________  External Attachment:    Type:   Image     Comment:   External Document

## 2011-02-06 LAB — POCT I-STAT 3, VENOUS BLOOD GAS (G3P V)
Acid-Base Excess: 1 mmol/L (ref 0.0–2.0)
Acid-Base Excess: 1 mmol/L (ref 0.0–2.0)
Bicarbonate: 25.7 mEq/L — ABNORMAL HIGH (ref 20.0–24.0)
Bicarbonate: 25.9 mEq/L — ABNORMAL HIGH (ref 20.0–24.0)
O2 Saturation: 55 %
O2 Saturation: 57 %
TCO2: 27 mmol/L (ref 0–100)
TCO2: 27 mmol/L (ref 0–100)
pCO2, Ven: 40.8 mmHg — ABNORMAL LOW (ref 45.0–50.0)
pCO2, Ven: 41.3 mmHg — ABNORMAL LOW (ref 45.0–50.0)
pH, Ven: 7.401 — ABNORMAL HIGH (ref 7.250–7.300)
pH, Ven: 7.411 — ABNORMAL HIGH (ref 7.250–7.300)
pO2, Ven: 29 mmHg — CL (ref 30.0–45.0)
pO2, Ven: 30 mmHg (ref 30.0–45.0)

## 2011-02-06 LAB — POCT I-STAT 3, ART BLOOD GAS (G3+)
Acid-Base Excess: 2 mmol/L (ref 0.0–2.0)
Bicarbonate: 25.6 mEq/L — ABNORMAL HIGH (ref 20.0–24.0)
O2 Saturation: 92 %
TCO2: 27 mmol/L (ref 0–100)
pCO2 arterial: 36.1 mmHg (ref 35.0–45.0)
pH, Arterial: 7.46 — ABNORMAL HIGH (ref 7.350–7.450)
pO2, Arterial: 59 mmHg — ABNORMAL LOW (ref 80.0–100.0)

## 2011-02-06 LAB — POCT I-STAT GLUCOSE
Glucose, Bld: 144 mg/dL — ABNORMAL HIGH (ref 70–99)
Operator id: 178271

## 2011-02-11 NOTE — Letter (Signed)
Summary: [facility]   Imported By: Marilynne Drivers 02/04/2011 14:53:07  _____________________________________________________________________  External Attachment:    Type:   Image     Comment:   External Document

## 2011-02-24 LAB — CBC
HCT: 44.3 % (ref 39.0–52.0)
Hemoglobin: 15 g/dL (ref 13.0–17.0)
MCHC: 33.7 g/dL (ref 30.0–36.0)
MCV: 84.5 fL (ref 78.0–100.0)
Platelets: 144 10*3/uL — ABNORMAL LOW (ref 150–400)
RBC: 5.25 MIL/uL (ref 4.22–5.81)
RDW: 16.2 % — ABNORMAL HIGH (ref 11.5–15.5)
WBC: 3.4 10*3/uL — ABNORMAL LOW (ref 4.0–10.5)

## 2011-02-24 LAB — GLUCOSE, CAPILLARY
Glucose-Capillary: 115 mg/dL — ABNORMAL HIGH (ref 70–99)
Glucose-Capillary: 123 mg/dL — ABNORMAL HIGH (ref 70–99)
Glucose-Capillary: 142 mg/dL — ABNORMAL HIGH (ref 70–99)
Glucose-Capillary: 149 mg/dL — ABNORMAL HIGH (ref 70–99)
Glucose-Capillary: 151 mg/dL — ABNORMAL HIGH (ref 70–99)
Glucose-Capillary: 152 mg/dL — ABNORMAL HIGH (ref 70–99)

## 2011-02-24 LAB — BASIC METABOLIC PANEL
BUN: 42 mg/dL — ABNORMAL HIGH (ref 6–23)
CO2: 22 mEq/L (ref 19–32)
Calcium: 9.6 mg/dL (ref 8.4–10.5)
Chloride: 102 mEq/L (ref 96–112)
Creatinine, Ser: 1.38 mg/dL (ref 0.4–1.5)
GFR calc Af Amer: >60 mL/min (ref >60)
GFR calc non Af Amer: 56 mL/min — ABNORMAL LOW (ref >60)
Glucose, Bld: 191 mg/dL — ABNORMAL HIGH (ref 70–99)
Potassium: 4.6 mEq/L (ref 3.5–5.1)
Sodium: 137 mEq/L (ref 135–145)

## 2011-02-24 LAB — PROTIME-INR
INR: 1.18 (ref 0.00–1.49)
Prothrombin Time: 14.9 seconds (ref 11.6–15.2)

## 2011-02-24 LAB — MAGNESIUM: Magnesium: 2.1 mg/dL (ref 1.5–2.5)

## 2011-02-24 LAB — APTT: aPTT: 30 seconds (ref 24–37)

## 2011-02-25 LAB — BASIC METABOLIC PANEL
BUN: 14 mg/dL (ref 6–23)
BUN: 14 mg/dL (ref 6–23)
BUN: 15 mg/dL (ref 6–23)
BUN: 15 mg/dL (ref 6–23)
BUN: 15 mg/dL (ref 6–23)
BUN: 17 mg/dL (ref 6–23)
BUN: 17 mg/dL (ref 6–23)
BUN: 17 mg/dL (ref 6–23)
BUN: 17 mg/dL (ref 6–23)
BUN: 18 mg/dL (ref 6–23)
BUN: 23 mg/dL (ref 6–23)
BUN: 28 mg/dL — ABNORMAL HIGH (ref 6–23)
BUN: 32 mg/dL — ABNORMAL HIGH (ref 6–23)
BUN: 40 mg/dL — ABNORMAL HIGH (ref 6–23)
CO2: 23 mEq/L (ref 19–32)
CO2: 26 mEq/L (ref 19–32)
CO2: 26 mEq/L (ref 19–32)
CO2: 27 mEq/L (ref 19–32)
CO2: 27 mEq/L (ref 19–32)
CO2: 27 mEq/L (ref 19–32)
CO2: 27 mEq/L (ref 19–32)
CO2: 27 mEq/L (ref 19–32)
CO2: 28 mEq/L (ref 19–32)
CO2: 29 mEq/L (ref 19–32)
CO2: 29 mEq/L (ref 19–32)
CO2: 30 mEq/L (ref 19–32)
CO2: 30 mEq/L (ref 19–32)
CO2: 30 mEq/L (ref 19–32)
Calcium: 7.5 mg/dL — ABNORMAL LOW (ref 8.4–10.5)
Calcium: 7.7 mg/dL — ABNORMAL LOW (ref 8.4–10.5)
Calcium: 8.2 mg/dL — ABNORMAL LOW (ref 8.4–10.5)
Calcium: 8.4 mg/dL (ref 8.4–10.5)
Calcium: 8.6 mg/dL (ref 8.4–10.5)
Calcium: 8.7 mg/dL (ref 8.4–10.5)
Calcium: 8.7 mg/dL (ref 8.4–10.5)
Calcium: 8.8 mg/dL (ref 8.4–10.5)
Calcium: 8.8 mg/dL (ref 8.4–10.5)
Calcium: 8.8 mg/dL (ref 8.4–10.5)
Calcium: 8.9 mg/dL (ref 8.4–10.5)
Calcium: 9.1 mg/dL (ref 8.4–10.5)
Calcium: 9.5 mg/dL (ref 8.4–10.5)
Calcium: 9.5 mg/dL (ref 8.4–10.5)
Chloride: 92 mEq/L — ABNORMAL LOW (ref 96–112)
Chloride: 95 mEq/L — ABNORMAL LOW (ref 96–112)
Chloride: 95 mEq/L — ABNORMAL LOW (ref 96–112)
Chloride: 95 mEq/L — ABNORMAL LOW (ref 96–112)
Chloride: 96 mEq/L (ref 96–112)
Chloride: 97 mEq/L (ref 96–112)
Chloride: 97 mEq/L (ref 96–112)
Chloride: 97 mEq/L (ref 96–112)
Chloride: 98 mEq/L (ref 96–112)
Chloride: 98 mEq/L (ref 96–112)
Chloride: 98 mEq/L (ref 96–112)
Chloride: 98 mEq/L (ref 96–112)
Chloride: 98 mEq/L (ref 96–112)
Chloride: 99 mEq/L (ref 96–112)
Creatinine, Ser: 0.63 mg/dL (ref 0.4–1.5)
Creatinine, Ser: 0.79 mg/dL (ref 0.4–1.5)
Creatinine, Ser: 0.83 mg/dL (ref 0.4–1.5)
Creatinine, Ser: 0.85 mg/dL (ref 0.4–1.5)
Creatinine, Ser: 0.85 mg/dL (ref 0.4–1.5)
Creatinine, Ser: 0.86 mg/dL (ref 0.4–1.5)
Creatinine, Ser: 0.89 mg/dL (ref 0.4–1.5)
Creatinine, Ser: 0.9 mg/dL (ref 0.4–1.5)
Creatinine, Ser: 0.91 mg/dL (ref 0.4–1.5)
Creatinine, Ser: 0.93 mg/dL (ref 0.4–1.5)
Creatinine, Ser: 0.97 mg/dL (ref 0.4–1.5)
Creatinine, Ser: 1.01 mg/dL (ref 0.4–1.5)
Creatinine, Ser: 1.03 mg/dL (ref 0.4–1.5)
Creatinine, Ser: 1.04 mg/dL (ref 0.4–1.5)
GFR calc Af Amer: >60 mL/min (ref >60)
GFR calc Af Amer: >60 mL/min (ref >60)
GFR calc Af Amer: >60 mL/min (ref >60)
GFR calc Af Amer: >60 mL/min (ref >60)
GFR calc Af Amer: >60 mL/min (ref >60)
GFR calc Af Amer: >60 mL/min (ref >60)
GFR calc Af Amer: >60 mL/min (ref >60)
GFR calc Af Amer: >60 mL/min (ref >60)
GFR calc Af Amer: >60 mL/min (ref >60)
GFR calc Af Amer: >60 mL/min (ref >60)
GFR calc Af Amer: >60 mL/min (ref >60)
GFR calc Af Amer: >60 mL/min (ref >60)
GFR calc Af Amer: >60 mL/min (ref >60)
GFR calc Af Amer: >60 mL/min (ref >60)
GFR calc non Af Amer: >60 mL/min (ref >60)
GFR calc non Af Amer: >60 mL/min (ref >60)
GFR calc non Af Amer: >60 mL/min (ref >60)
GFR calc non Af Amer: >60 mL/min (ref >60)
GFR calc non Af Amer: >60 mL/min (ref >60)
GFR calc non Af Amer: >60 mL/min (ref >60)
GFR calc non Af Amer: >60 mL/min (ref >60)
GFR calc non Af Amer: >60 mL/min (ref >60)
GFR calc non Af Amer: >60 mL/min (ref >60)
GFR calc non Af Amer: >60 mL/min (ref >60)
GFR calc non Af Amer: >60 mL/min (ref >60)
GFR calc non Af Amer: >60 mL/min (ref >60)
GFR calc non Af Amer: >60 mL/min (ref >60)
GFR calc non Af Amer: >60 mL/min (ref >60)
Glucose, Bld: 154 mg/dL — ABNORMAL HIGH (ref 70–99)
Glucose, Bld: 159 mg/dL — ABNORMAL HIGH (ref 70–99)
Glucose, Bld: 161 mg/dL — ABNORMAL HIGH (ref 70–99)
Glucose, Bld: 168 mg/dL — ABNORMAL HIGH (ref 70–99)
Glucose, Bld: 169 mg/dL — ABNORMAL HIGH (ref 70–99)
Glucose, Bld: 176 mg/dL — ABNORMAL HIGH (ref 70–99)
Glucose, Bld: 182 mg/dL — ABNORMAL HIGH (ref 70–99)
Glucose, Bld: 194 mg/dL — ABNORMAL HIGH (ref 70–99)
Glucose, Bld: 206 mg/dL — ABNORMAL HIGH (ref 70–99)
Glucose, Bld: 208 mg/dL — ABNORMAL HIGH (ref 70–99)
Glucose, Bld: 210 mg/dL — ABNORMAL HIGH (ref 70–99)
Glucose, Bld: 212 mg/dL — ABNORMAL HIGH (ref 70–99)
Glucose, Bld: 270 mg/dL — ABNORMAL HIGH (ref 70–99)
Glucose, Bld: 345 mg/dL — ABNORMAL HIGH (ref 70–99)
Potassium: 3.7 mEq/L (ref 3.5–5.1)
Potassium: 3.8 mEq/L (ref 3.5–5.1)
Potassium: 4 mEq/L (ref 3.5–5.1)
Potassium: 4 mEq/L (ref 3.5–5.1)
Potassium: 4.1 mEq/L (ref 3.5–5.1)
Potassium: 4.2 mEq/L (ref 3.5–5.1)
Potassium: 4.2 mEq/L (ref 3.5–5.1)
Potassium: 4.2 mEq/L (ref 3.5–5.1)
Potassium: 4.3 mEq/L (ref 3.5–5.1)
Potassium: 4.3 mEq/L (ref 3.5–5.1)
Potassium: 4.3 mEq/L (ref 3.5–5.1)
Potassium: 4.4 mEq/L (ref 3.5–5.1)
Potassium: 4.4 mEq/L (ref 3.5–5.1)
Potassium: 4.8 mEq/L (ref 3.5–5.1)
Sodium: 126 mEq/L — ABNORMAL LOW (ref 135–145)
Sodium: 131 mEq/L — ABNORMAL LOW (ref 135–145)
Sodium: 132 mEq/L — ABNORMAL LOW (ref 135–145)
Sodium: 132 mEq/L — ABNORMAL LOW (ref 135–145)
Sodium: 132 mEq/L — ABNORMAL LOW (ref 135–145)
Sodium: 133 mEq/L — ABNORMAL LOW (ref 135–145)
Sodium: 134 mEq/L — ABNORMAL LOW (ref 135–145)
Sodium: 134 mEq/L — ABNORMAL LOW (ref 135–145)
Sodium: 134 mEq/L — ABNORMAL LOW (ref 135–145)
Sodium: 134 mEq/L — ABNORMAL LOW (ref 135–145)
Sodium: 134 mEq/L — ABNORMAL LOW (ref 135–145)
Sodium: 135 mEq/L (ref 135–145)
Sodium: 136 mEq/L (ref 135–145)
Sodium: 137 mEq/L (ref 135–145)

## 2011-02-25 LAB — CBC
HCT: 40.3 % (ref 39.0–52.0)
HCT: 41.5 % (ref 39.0–52.0)
HCT: 43.7 % (ref 39.0–52.0)
HCT: 45.1 % (ref 39.0–52.0)
HCT: 45.5 % (ref 39.0–52.0)
HCT: 47 % (ref 39.0–52.0)
Hemoglobin: 13.3 g/dL (ref 13.0–17.0)
Hemoglobin: 13.8 g/dL (ref 13.0–17.0)
Hemoglobin: 14.3 g/dL (ref 13.0–17.0)
Hemoglobin: 14.8 g/dL (ref 13.0–17.0)
Hemoglobin: 15 g/dL (ref 13.0–17.0)
Hemoglobin: 15.7 g/dL (ref 13.0–17.0)
MCHC: 32.4 g/dL (ref 30.0–36.0)
MCHC: 32.6 g/dL (ref 30.0–36.0)
MCHC: 33 g/dL (ref 30.0–36.0)
MCHC: 33.3 g/dL (ref 30.0–36.0)
MCHC: 33.3 g/dL (ref 30.0–36.0)
MCHC: 33.3 g/dL (ref 30.0–36.0)
MCV: 85.7 fL (ref 78.0–100.0)
MCV: 86.2 fL (ref 78.0–100.0)
MCV: 86.3 fL (ref 78.0–100.0)
MCV: 86.4 fL (ref 78.0–100.0)
MCV: 86.8 fL (ref 78.0–100.0)
MCV: 88.3 fL (ref 78.0–100.0)
Platelets: 171 10*3/uL (ref 150–400)
Platelets: 172 10*3/uL (ref 150–400)
Platelets: 173 10*3/uL (ref 150–400)
Platelets: 181 10*3/uL (ref 150–400)
Platelets: 191 10*3/uL (ref 150–400)
Platelets: 195 10*3/uL (ref 150–400)
RBC: 4.66 MIL/uL (ref 4.22–5.81)
RBC: 4.81 MIL/uL (ref 4.22–5.81)
RBC: 5.04 MIL/uL (ref 4.22–5.81)
RBC: 5.15 MIL/uL (ref 4.22–5.81)
RBC: 5.23 MIL/uL (ref 4.22–5.81)
RBC: 5.48 MIL/uL (ref 4.22–5.81)
RDW: 16.1 % — ABNORMAL HIGH (ref 11.5–15.5)
RDW: 16.5 % — ABNORMAL HIGH (ref 11.5–15.5)
RDW: 16.7 % — ABNORMAL HIGH (ref 11.5–15.5)
RDW: 16.8 % — ABNORMAL HIGH (ref 11.5–15.5)
RDW: 17 % — ABNORMAL HIGH (ref 11.5–15.5)
RDW: 17 % — ABNORMAL HIGH (ref 11.5–15.5)
WBC: 4.7 10*3/uL (ref 4.0–10.5)
WBC: 5 10*3/uL (ref 4.0–10.5)
WBC: 6.4 10*3/uL (ref 4.0–10.5)
WBC: 6.7 10*3/uL (ref 4.0–10.5)
WBC: 7 10*3/uL (ref 4.0–10.5)
WBC: 7.3 10*3/uL (ref 4.0–10.5)

## 2011-02-25 LAB — URINALYSIS, ROUTINE W REFLEX MICROSCOPIC
Glucose, UA: NEGATIVE mg/dL
Hgb urine dipstick: NEGATIVE
Ketones, ur: 15 mg/dL — AB
Nitrite: NEGATIVE
Protein, ur: 100 mg/dL — AB
Specific Gravity, Urine: 1.027 (ref 1.005–1.030)
Urobilinogen, UA: 4 mg/dL — ABNORMAL HIGH (ref 0.0–1.0)
pH: 6 (ref 5.0–8.0)

## 2011-02-25 LAB — GLUCOSE, CAPILLARY
Glucose-Capillary: 121 mg/dL — ABNORMAL HIGH (ref 70–99)
Glucose-Capillary: 124 mg/dL — ABNORMAL HIGH (ref 70–99)
Glucose-Capillary: 138 mg/dL — ABNORMAL HIGH (ref 70–99)
Glucose-Capillary: 144 mg/dL — ABNORMAL HIGH (ref 70–99)
Glucose-Capillary: 151 mg/dL — ABNORMAL HIGH (ref 70–99)
Glucose-Capillary: 151 mg/dL — ABNORMAL HIGH (ref 70–99)
Glucose-Capillary: 155 mg/dL — ABNORMAL HIGH (ref 70–99)
Glucose-Capillary: 156 mg/dL — ABNORMAL HIGH (ref 70–99)
Glucose-Capillary: 159 mg/dL — ABNORMAL HIGH (ref 70–99)
Glucose-Capillary: 161 mg/dL — ABNORMAL HIGH (ref 70–99)
Glucose-Capillary: 166 mg/dL — ABNORMAL HIGH (ref 70–99)
Glucose-Capillary: 169 mg/dL — ABNORMAL HIGH (ref 70–99)
Glucose-Capillary: 170 mg/dL — ABNORMAL HIGH (ref 70–99)
Glucose-Capillary: 173 mg/dL — ABNORMAL HIGH (ref 70–99)
Glucose-Capillary: 174 mg/dL — ABNORMAL HIGH (ref 70–99)
Glucose-Capillary: 177 mg/dL — ABNORMAL HIGH (ref 70–99)
Glucose-Capillary: 177 mg/dL — ABNORMAL HIGH (ref 70–99)
Glucose-Capillary: 183 mg/dL — ABNORMAL HIGH (ref 70–99)
Glucose-Capillary: 183 mg/dL — ABNORMAL HIGH (ref 70–99)
Glucose-Capillary: 184 mg/dL — ABNORMAL HIGH (ref 70–99)
Glucose-Capillary: 184 mg/dL — ABNORMAL HIGH (ref 70–99)
Glucose-Capillary: 185 mg/dL — ABNORMAL HIGH (ref 70–99)
Glucose-Capillary: 187 mg/dL — ABNORMAL HIGH (ref 70–99)
Glucose-Capillary: 187 mg/dL — ABNORMAL HIGH (ref 70–99)
Glucose-Capillary: 189 mg/dL — ABNORMAL HIGH (ref 70–99)
Glucose-Capillary: 194 mg/dL — ABNORMAL HIGH (ref 70–99)
Glucose-Capillary: 201 mg/dL — ABNORMAL HIGH (ref 70–99)
Glucose-Capillary: 202 mg/dL — ABNORMAL HIGH (ref 70–99)
Glucose-Capillary: 208 mg/dL — ABNORMAL HIGH (ref 70–99)
Glucose-Capillary: 209 mg/dL — ABNORMAL HIGH (ref 70–99)
Glucose-Capillary: 218 mg/dL — ABNORMAL HIGH (ref 70–99)
Glucose-Capillary: 226 mg/dL — ABNORMAL HIGH (ref 70–99)
Glucose-Capillary: 226 mg/dL — ABNORMAL HIGH (ref 70–99)
Glucose-Capillary: 231 mg/dL — ABNORMAL HIGH (ref 70–99)
Glucose-Capillary: 240 mg/dL — ABNORMAL HIGH (ref 70–99)
Glucose-Capillary: 242 mg/dL — ABNORMAL HIGH (ref 70–99)
Glucose-Capillary: 245 mg/dL — ABNORMAL HIGH (ref 70–99)
Glucose-Capillary: 262 mg/dL — ABNORMAL HIGH (ref 70–99)
Glucose-Capillary: 274 mg/dL — ABNORMAL HIGH (ref 70–99)
Glucose-Capillary: 285 mg/dL — ABNORMAL HIGH (ref 70–99)
Glucose-Capillary: 289 mg/dL — ABNORMAL HIGH (ref 70–99)
Glucose-Capillary: 292 mg/dL — ABNORMAL HIGH (ref 70–99)
Glucose-Capillary: 310 mg/dL — ABNORMAL HIGH (ref 70–99)
Glucose-Capillary: 311 mg/dL — ABNORMAL HIGH (ref 70–99)
Glucose-Capillary: 316 mg/dL — ABNORMAL HIGH (ref 70–99)
Glucose-Capillary: 320 mg/dL — ABNORMAL HIGH (ref 70–99)
Glucose-Capillary: 353 mg/dL — ABNORMAL HIGH (ref 70–99)
Glucose-Capillary: 371 mg/dL — ABNORMAL HIGH (ref 70–99)
Glucose-Capillary: 376 mg/dL — ABNORMAL HIGH (ref 70–99)

## 2011-02-25 LAB — HEPARIN LEVEL (UNFRACTIONATED)
Heparin Unfractionated: 0.41 IU/mL (ref 0.30–0.70)
Heparin Unfractionated: 0.48 IU/mL (ref 0.30–0.70)
Heparin Unfractionated: <0.1 IU/mL — ABNORMAL LOW (ref 0.30–0.70)

## 2011-02-25 LAB — COMPREHENSIVE METABOLIC PANEL
ALT: 13 U/L (ref 0–53)
AST: 28 U/L (ref 0–37)
Albumin: 3.6 g/dL (ref 3.5–5.2)
Alkaline Phosphatase: 121 U/L — ABNORMAL HIGH (ref 39–117)
BUN: 17 mg/dL (ref 6–23)
CO2: 26 mEq/L (ref 19–32)
Calcium: 9 mg/dL (ref 8.4–10.5)
Chloride: 98 mEq/L (ref 96–112)
Creatinine, Ser: 1.09 mg/dL (ref 0.4–1.5)
GFR calc Af Amer: >60 mL/min (ref >60)
GFR calc non Af Amer: >60 mL/min (ref >60)
Glucose, Bld: 166 mg/dL — ABNORMAL HIGH (ref 70–99)
Potassium: 3.6 mEq/L (ref 3.5–5.1)
Sodium: 134 mEq/L — ABNORMAL LOW (ref 135–145)
Total Bilirubin: 3.2 mg/dL — ABNORMAL HIGH (ref 0.3–1.2)
Total Protein: 6.8 g/dL (ref 6.0–8.3)

## 2011-02-25 LAB — DRUGS OF ABUSE SCREEN W/O ALC, ROUTINE URINE
Amphetamine Screen, Ur: NEGATIVE
Barbiturate Quant, Ur: NEGATIVE
Benzodiazepines.: NEGATIVE
Cocaine Metabolites: NEGATIVE
Creatinine,U: 24.2 mg/dL
Marijuana Metabolite: POSITIVE — AB
Methadone: NEGATIVE
Opiate Screen, Urine: NEGATIVE
Phencyclidine (PCP): NEGATIVE
Propoxyphene: NEGATIVE

## 2011-02-25 LAB — PROTIME-INR
INR: 1.22 (ref 0.00–1.49)
Prothrombin Time: 15.3 seconds — ABNORMAL HIGH (ref 11.6–15.2)

## 2011-02-25 LAB — CARBOXYHEMOGLOBIN
Carboxyhemoglobin: 1.2 % (ref 0.5–1.5)
Carboxyhemoglobin: 1.5 % (ref 0.5–1.5)
Carboxyhemoglobin: 1.6 % — ABNORMAL HIGH (ref 0.5–1.5)
Carboxyhemoglobin: 1.6 % — ABNORMAL HIGH (ref 0.5–1.5)
Carboxyhemoglobin: 1.7 % — ABNORMAL HIGH (ref 0.5–1.5)
Carboxyhemoglobin: 1.7 % — ABNORMAL HIGH (ref 0.5–1.5)
Carboxyhemoglobin: 1.8 % — ABNORMAL HIGH (ref 0.5–1.5)
Carboxyhemoglobin: 2 % — ABNORMAL HIGH (ref 0.5–1.5)
Carboxyhemoglobin: 2 % — ABNORMAL HIGH (ref 0.5–1.5)
Methemoglobin: 0.3 % (ref 0.0–1.5)
Methemoglobin: 0.5 % (ref 0.0–1.5)
Methemoglobin: 0.6 % (ref 0.0–1.5)
Methemoglobin: 0.6 % (ref 0.0–1.5)
Methemoglobin: 0.9 % (ref 0.0–1.5)
Methemoglobin: 1.1 % (ref 0.0–1.5)
Methemoglobin: 1.1 % (ref 0.0–1.5)
Methemoglobin: 1.1 % (ref 0.0–1.5)
Methemoglobin: 1.2 % (ref 0.0–1.5)
O2 Saturation: 45.3 %
O2 Saturation: 48.5 %
O2 Saturation: 49.4 %
O2 Saturation: 61.5 %
O2 Saturation: 64 %
O2 Saturation: 69.6 %
O2 Saturation: 72.7 %
O2 Saturation: 77.2 %
O2 Saturation: 88 %
Total hemoglobin: 12.9 g/dL — ABNORMAL LOW (ref 13.5–18.0)
Total hemoglobin: 13 g/dL — ABNORMAL LOW (ref 13.5–18.0)
Total hemoglobin: 13.9 g/dL (ref 13.5–18.0)
Total hemoglobin: 14 g/dL (ref 13.5–18.0)
Total hemoglobin: 14 g/dL (ref 13.5–18.0)
Total hemoglobin: 14.6 g/dL (ref 13.5–18.0)
Total hemoglobin: 14.7 g/dL (ref 13.5–18.0)
Total hemoglobin: 15.4 g/dL (ref 13.5–18.0)
Total hemoglobin: 15.5 g/dL (ref 13.5–18.0)

## 2011-02-25 LAB — CULTURE, BLOOD (ROUTINE X 2)
Culture: NO GROWTH
Culture: NO GROWTH

## 2011-02-25 LAB — URINE CULTURE
Colony Count: NO GROWTH
Culture: NO GROWTH

## 2011-02-25 LAB — POCT I-STAT 3, VENOUS BLOOD GAS (G3P V)
Acid-Base Excess: 2 mmol/L (ref 0.0–2.0)
Bicarbonate: 25.5 mEq/L — ABNORMAL HIGH (ref 20.0–24.0)
Bicarbonate: 26.8 mEq/L — ABNORMAL HIGH (ref 20.0–24.0)
O2 Saturation: 58 %
O2 Saturation: 58 %
TCO2: 27 mmol/L (ref 0–100)
TCO2: 28 mmol/L (ref 0–100)
pCO2, Ven: 41.6 mmHg — ABNORMAL LOW (ref 45.0–50.0)
pCO2, Ven: 42.7 mmHg — ABNORMAL LOW (ref 45.0–50.0)
pH, Ven: 7.395 — ABNORMAL HIGH (ref 7.250–7.300)
pH, Ven: 7.405 — ABNORMAL HIGH (ref 7.250–7.300)
pO2, Ven: 30 mmHg (ref 30.0–45.0)
pO2, Ven: 30 mmHg (ref 30.0–45.0)

## 2011-02-25 LAB — POCT I-STAT 3, ART BLOOD GAS (G3+)
Acid-Base Excess: 6 mmol/L — ABNORMAL HIGH (ref 0.0–2.0)
Bicarbonate: 27.8 mEq/L — ABNORMAL HIGH (ref 20.0–24.0)
O2 Saturation: 94 %
TCO2: 29 mmol/L (ref 0–100)
pCO2 arterial: 32.9 mmHg — ABNORMAL LOW (ref 35.0–45.0)
pH, Arterial: 7.535 — ABNORMAL HIGH (ref 7.350–7.450)
pO2, Arterial: 61 mmHg — ABNORMAL LOW (ref 80.0–100.0)

## 2011-02-25 LAB — URIC ACID, RANDOM URINE: Uric Acid, Urine: 3.1 mg/dL

## 2011-02-25 LAB — SODIUM, URINE, RANDOM: Sodium, Ur: 49 mEq/L

## 2011-02-25 LAB — TSH: TSH: 3.274 u[IU]/mL (ref 0.350–4.500)

## 2011-02-25 LAB — MAGNESIUM
Magnesium: 1.6 mg/dL (ref 1.5–2.5)
Magnesium: 1.7 mg/dL (ref 1.5–2.5)

## 2011-02-25 LAB — THC (MARIJUANA), URINE, CONFIRMATION: Marijuana, Ur-Confirmation: 55 ng/mL

## 2011-02-25 LAB — URINE MICROSCOPIC-ADD ON

## 2011-02-25 LAB — BRAIN NATRIURETIC PEPTIDE
Pro B Natriuretic peptide (BNP): 1460 pg/mL — ABNORMAL HIGH (ref 0.0–100.0)
Pro B Natriuretic peptide (BNP): 519 pg/mL — ABNORMAL HIGH (ref 0.0–100.0)

## 2011-02-25 LAB — PHOSPHORUS: Phosphorus: 4 mg/dL (ref 2.3–4.6)

## 2011-02-25 LAB — T4, FREE: Free T4: 1.64 ng/dL (ref 0.80–1.80)

## 2011-02-25 LAB — T3, FREE: T3, Free: 2.6 pg/mL (ref 2.3–4.2)

## 2011-02-25 LAB — DIGOXIN LEVEL: Digoxin Level: <0.2 ng/mL — ABNORMAL LOW (ref 0.8–2.0)

## 2011-02-26 LAB — CBC
HCT: 43 % (ref 39.0–52.0)
Hemoglobin: 14.3 g/dL (ref 13.0–17.0)
MCHC: 33.2 g/dL (ref 30.0–36.0)
MCV: 86.5 fL (ref 78.0–100.0)
Platelets: 172 10*3/uL (ref 150–400)
RBC: 4.98 MIL/uL (ref 4.22–5.81)
RDW: 16 % — ABNORMAL HIGH (ref 11.5–15.5)
WBC: 3.6 10*3/uL — ABNORMAL LOW (ref 4.0–10.5)

## 2011-02-26 LAB — POCT CARDIAC MARKERS
CKMB, poc: <1 ng/mL — ABNORMAL LOW (ref 1.0–8.0)
Myoglobin, poc: 22.9 ng/mL (ref 12–200)
Troponin i, poc: <0.05 ng/mL (ref 0.00–0.09)

## 2011-02-26 LAB — POCT I-STAT, CHEM 8
BUN: 16 mg/dL (ref 6–23)
BUN: 16 mg/dL (ref 6–23)
Calcium, Ion: 1.11 mmol/L — ABNORMAL LOW (ref 1.12–1.32)
Calcium, Ion: 1.11 mmol/L — ABNORMAL LOW (ref 1.12–1.32)
Chloride: 99 mEq/L (ref 96–112)
Chloride: 99 mEq/L (ref 96–112)
Creatinine, Ser: 0.9 mg/dL (ref 0.4–1.5)
Creatinine, Ser: 1 mg/dL (ref 0.4–1.5)
Glucose, Bld: 153 mg/dL — ABNORMAL HIGH (ref 70–99)
Glucose, Bld: 173 mg/dL — ABNORMAL HIGH (ref 70–99)
HCT: 46 % (ref 39.0–52.0)
HCT: 53 % — ABNORMAL HIGH (ref 39.0–52.0)
Hemoglobin: 15.6 g/dL (ref 13.0–17.0)
Hemoglobin: 18 g/dL — ABNORMAL HIGH (ref 13.0–17.0)
Potassium: 3.6 mEq/L (ref 3.5–5.1)
Potassium: 3.8 mEq/L (ref 3.5–5.1)
Sodium: 138 mEq/L (ref 135–145)
Sodium: 138 mEq/L (ref 135–145)
TCO2: 26 mmol/L (ref 0–100)
TCO2: 27 mmol/L (ref 0–100)

## 2011-02-26 LAB — DIFFERENTIAL
Basophils Absolute: 0 10*3/uL (ref 0.0–0.1)
Basophils Relative: 1 % (ref 0–1)
Eosinophils Absolute: 0.1 10*3/uL (ref 0.0–0.7)
Eosinophils Relative: 3 % (ref 0–5)
Lymphocytes Relative: 17 % (ref 12–46)
Lymphs Abs: 0.6 10*3/uL — ABNORMAL LOW (ref 0.7–4.0)
Monocytes Absolute: 0.5 10*3/uL (ref 0.1–1.0)
Monocytes Relative: 13 % — ABNORMAL HIGH (ref 3–12)
Neutro Abs: 2.4 10*3/uL (ref 1.7–7.7)
Neutrophils Relative %: 66 % (ref 43–77)

## 2011-02-26 LAB — BRAIN NATRIURETIC PEPTIDE: Pro B Natriuretic peptide (BNP): 688 pg/mL — ABNORMAL HIGH (ref 0.0–100.0)

## 2011-03-12 ENCOUNTER — Telehealth: Payer: Self-pay | Admitting: Internal Medicine

## 2011-03-12 NOTE — Telephone Encounter (Signed)
Spoke w/pt he is doing well, was d/c'd home yest and is going tom for his 1st f/u appt

## 2011-03-12 NOTE — Telephone Encounter (Signed)
Attempted to call pt back, no answer.

## 2011-03-12 NOTE — Telephone Encounter (Signed)
Pt states he is home from the hospital and he doing well

## 2011-03-13 ENCOUNTER — Telehealth: Payer: Self-pay | Admitting: Internal Medicine

## 2011-03-13 DIAGNOSIS — I509 Heart failure, unspecified: Secondary | ICD-10-CM

## 2011-03-13 NOTE — Telephone Encounter (Signed)
Patient called and states that he was discharged from [facility] on Tuesday and that he needs a follow up BMET. Advised him to come tomorrow after 830 am for a lab test.

## 2011-03-13 NOTE — Telephone Encounter (Signed)
PT RTN CALL

## 2011-03-14 ENCOUNTER — Other Ambulatory Visit: Payer: Medicaid Other | Admitting: *Deleted

## 2011-03-14 MED ORDER — FUROSEMIDE 40 MG PO TABS: 40.0000 mg | ORAL_TABLET | Freq: Two times a day (BID) | ORAL | Status: DC

## 2011-03-14 NOTE — Telephone Encounter (Signed)
Addended by: [name] on: 03/14/2011 11:42 AM   Modules accepted: Orders

## 2011-03-27 ENCOUNTER — Telehealth: Payer: Self-pay | Admitting: Internal Medicine

## 2011-03-27 NOTE — Telephone Encounter (Signed)
Pharmacist Claiborne Billings # [phone]. Has question re pt adcirca.

## 2011-03-31 ENCOUNTER — Telehealth: Payer: Self-pay | Admitting: Internal Medicine

## 2011-03-31 NOTE — Telephone Encounter (Signed)
Pharmacist Legrand Como have question re pt meds. [facility] # [phone] ext [phone].

## 2011-04-01 ENCOUNTER — Telehealth: Payer: Self-pay | Admitting: Internal Medicine

## 2011-04-01 NOTE — Telephone Encounter (Signed)
Documented in phone note dated 5/7

## 2011-04-01 NOTE — Telephone Encounter (Signed)
Pt was ordered Adcirca prior to heart transplant but was never started on med, want to know if pt should be started on med now or not, will check w/Dr Bensimhon and give them a call back

## 2011-04-01 NOTE — Telephone Encounter (Signed)
Pt rtn David Gregory's call from last week

## 2011-04-01 NOTE — Telephone Encounter (Signed)
Spoke w/pt he states that he was admitted to [facility] for fluid overload and is doing better now, he is going to [facility] once a week, he will let us know when he needs to come in here

## 2011-04-03 ENCOUNTER — Encounter: Payer: Self-pay | Admitting: Internal Medicine

## 2011-04-07 NOTE — Telephone Encounter (Signed)
Calling back to inquiry about Adcirca to see if pt needs to start this med.

## 2011-04-08 NOTE — Assessment & Plan Note (Signed)
[facility] OFFICE NOTE   David Gregory, David Gregory                        MRN:          [id number]  DATE:04/11/2008                            DOB:          February 25, 1966    INTERVAL HISTORY:  David Gregory is a  45 year old male with history of  congestive heart failure due to nonischemic cardiomyopathy.  He also has  a history of hypertension, diabetes, ongoing tobacco and alcohol use.   I saw him for the first time in over 3 years last week when he had a  complaint of severe heart failure symptoms and he had massive volume  overload.  I did a quick look echocardiogram.  Ejection fraction looked  to be about 40% to 45%.  I suggested he be hospitalized for diuresis,  but he refused.  We thus started him on Lasix 80 b.i.d. and Zaroxolyn  2.5 b.i.d..  I had him return today for follow-up.   He says he is doing great.  All his symptoms have resolved.  He is down  23 pounds.  He has not had any orthopnea, no PND.  No lower extremity  edema.  He does feel occasionally lightheaded.   CURRENT MEDICATIONS:  He is a bit unsure of these  Apparently he is  taking Coreg 25 once a day, enalapril 10 a day, Lasix 80 b.i.d. and  Janumet 50/1000 once a day.   PHYSICAL EXAMINATION:  He is no acute distress.  Ambulates around the  clinic without any respiratory difficulty.  Blood pressure is 122/80,  heart rate is 90 weight is 206.  HEENT:  Normal.  NECK:  Supple.  There is no JVD.  Carotids are 2+ bilaterally, without  any bruits.  There is no lymphadenopathy or thyromegaly.  CARDIAC:  PMI is nondisplaced.  He has regular rhythm, S4.  Soft  systolic murmur at the apex.  LUNGS:  Clear.  ABDOMEN:  Soft, nontender, nondistended.  There is no  hepatosplenomegaly, no bruits, no masses.  Good bowel sounds.  EXTREMITIES:  Warm with no cyanosis, clubbing or edema.  No rash.  NEURO:  Alert and oriented x3.  Cranial nerves 2-12 are intact.   Moves  all 4 extremities without difficulty.  Affect is pleasant.   ASSESSMENT/PLAN:  1. Acute on chronic systolic heart failure.  He is much improved with      diuresis.  He will now need extensive heart failure education.  I      have also counseled him on the need to stop smoking and stop      drinking, but I am not sure this is going to happen.  We will try      to smooth out his medicine regimen.  Will put him on Coreg 12.5      b.i.d.,  Lasix 40 mg the morning with instructions for a Lasix      sliding-scale.  Potassium 20 in the morning, enalapril 10 b.i.d.,      digoxin 0.25 every day and Janumet in the morning.  I will have him  follow up with Rosanne Sack in the heart failure clinic in the      next week or 2.  I have asked him to bring [place] all his      medications to that visit.  He will then see me back in several      weeks.  2. Hypertension well-controlled.     Shaune Pascal. Bensimhon, MD  Electronically Signed    DRB/MedQ  DD: 04/11/2008  DT: 04/11/2008  Job #: [id number]

## 2011-04-08 NOTE — Assessment & Plan Note (Signed)
[facility] OFFICE NOTE   David Gregory                        MRN:          [id number]  DATE:04/06/2008                            DOB:          1966/08/19    PRIMARY CARE:  [facility].   HISTORY OF PRESENT ILLNESS:  David Gregory is 45 year old male with a history  of severe congestive heart failure secondary to nonischemic  cardiomyopathy dating back to about that 2006.  Also has a history of  hypertension, diabetes, and ongoing tobacco use, and ongoing alcohol  abuse.  He presents today after not being seen here for over 3 years for  progressive heart failure symptoms.   His heart failure was diagnosed in January 2006 when he presented in  cardiogenic shock with respiratory failure.  He was intubated.  Fortunately, ejection fraction was 10 or 20% at that time.  He underwent  catheterization which showed no coronary artery disease but severe LV  dysfunction.  This was thought to be secondary to alcoholic  cardiomyopathy.  He had renal failure and required dobutamine for  inotropic support.  He did follow up in the office but he started  feeling better and then he did not come back.  His last visit here was  in February 2006.   Since that time, he says that he has been doing okay.  Occasionally, he  runs out of medications.  He gets more short of breath and a start his  medications back up.  However, over the past 2 months he has become  increasingly short of breath and noticed severe ankle swelling.  He has  had mild orthopnea.  He tried increasing his Lasix to 80 mg in the  morning and 40 mg at night.  This helped a little while but then stopped  working.  He was worried he was going to hurt his kidneys so he stopped.  He has started back all his medications but says he still feels quite  short of breath.  He occasionally has some tightness in his throat but  this is not regular.  He does have mild  orthopnea.  He gets short of  breath just walking 50 to 100 yards at the most.   He recently was in the hospital few weeks ago for bright red blood per  rectum.  He underwent EGD and colonoscopy and they are unable to find  the source of bleeding.   He continues to drink about a six-pack of beer a day, more on the  weekends and smokes three cigarettes a day.   REVIEW OF SYSTEMS:  Denies palpitations, syncope or presyncope.  No  focal neurologic symptoms.  No fevers or chills.  No nausea or vomiting.  The remainder of review of systems is as per HPI and problem list.   PAST MEDICAL HISTORY:  1. Congestive heart failure secondary to severe ischemic      cardiomyopathy as above.  2. Hypertension.  3. Hyperlipidemia.  4. Obesity.  5. Diabetes.  6. Alcohol abuse ongoing.  7. Tobacco abuse ongoing.   CURRENT MEDICATIONS:  1. Digoxin 0.25 a day.  2. Carvedilol 25 b.i.d.  3. Enalapril 10 a day.  4. Lasix 40 a day.  5. Janumet 50/1000.   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  He  continues to smoke and drink.   FAMILY HISTORY:  There is no significant family history of early heart  failure.   PHYSICAL EXAM:  He ambulates around the clinic without respiratory  difficulty.  He is not acutely ill-appearing.  Blood pressure is 125/85.  Heart rate 92 weights 229 which is up 16  pounds since we saw him last in 2006.  HEENT is normal.  Neck is thick and supple.  JVP appears elevated past the angle of the  jaw.  Carotids are 2+ bilateral bruits.  There is no lymphadenopathy or  thyromegaly.  CARDIAC:  PMI is laterally displaced.  He is regular.  I do not hear an  S3 no obvious murmur.  LUNGS:  Clear with mild basilar crackles.  ABDOMEN:  Soft, nontender, mildly distended.  No hepatosplenomegaly, no  bruits, no masses appreciated.  Extremities are warm.  There is at least 3+ edema bilaterally.  No rash.  No cyanosis or clubbing.  There are no cords.  NEURO:   Alert and x3.  Cranial nerves II-XII are intact.  Moves all 4  extremities without difficulty.  Affect is pleasant.   EKG shows normal sinus rhythm with at a rate of 93.  There is poor R  wave progression across the anterior precordium.  Nonspecific ST-T wave  abnormalities.  Bedside echo, I would perform an echo and his LV  ejection fraction looks to be about 40-45%, on the short axis there is  significant hypokinesis of the anterior wall which is a regional, there  is mild tricuspid regurgitation.  The RV is quite dilated and  hypokinetic.  There is only mild mitral regurgitation.   ASSESSMENT/PLAN:  1. Acute on chronic systolic heart failure.  His ejection fraction is      somewhat improved from previous.  However, he has significant      volume overload which is not responding to IV diuresis.  I have      suggested he be admitted to the hospital, but he actually refuses      to do this at this point.  Thus we will go ahead and try and      diurese him as an outpatient given Lasix 80 b.i.d. with Zaroxolyn      2.5 b.i.d. and some potassium 20 b.i.d.  We will check labs today.  2. Regional wall motion abnormalities.  Echocardiogram is quite      concerning for underlying coronary disease, although his      catheterization 3 years ago showed normal coronaries.  He has      enough risk factors that this may have progressed.  Also concerned      about the size of his right ventricle.  He has no obvious risks for      pulmonary embolus.  I suspect he may have obstructive sleep apnea.      Fortunately, his pulmonary pressures are not overly elevated by      echocardiogram.  At this point, we will continue to manage him      medically.  Will check a D-dimer.  If this is markedly elevated      will need to pursue that.  3. Alcohol abuse.  I suspect this is at  the root of some of his      cardiomyopathy.  Had a long talk with him about the need to stop      drinking and smoking but he admits  that this is a difficult for      him.  He is not sure he will be able to do it.  4. We will see him back in the clinic early next week for prompt      followup and to make sure he is getting better.  If not we will      have to re-encourage him for hospitalization and even possible      catheterization.  5. Tobacco use as above.  Counsel him on the need for smoking      cessation.   Total time with visit and chart, and dictation, about an hour and 15  minutes.     David Pascal. Bensimhon, MD  Electronically Signed    DRB/MedQ  DD: 04/06/2008  DT: 04/06/2008  Job #: [id number]

## 2011-04-08 NOTE — Telephone Encounter (Signed)
Per Dr Haroldine Laws pt does not need med, pharmacy is aware

## 2011-04-08 NOTE — H&P (Signed)
NAMEERYCK, COEN NO.:  [id number]   MEDICAL RECORD NO.:  [id number]          PATIENT TYPE:  INP   LOCATION:  [id number]                         FACILITY:  [facility]   PHYSICIAN:  Bartholomew Boards, MD      DATE OF BIRTH:  Jan 26, 1966   DATE OF ADMISSION:  02/16/2008  DATE OF DISCHARGE:                              HISTORY & PHYSICAL   ADMITTING SERVICE:  [facility] Hospitalist [facility].   CHIEF COMPLAINT:  Bright red blood per rectum.   HISTORY OF PRESENT ILLNESS:  This is a 45 year old African American male  with a history of diabetes and hypertension, who presents with bright  red blood per rectum.  He reports this has been intermittent since  having a bowel movement on February 13, 2008.  He did not have it on March  23 or March 24 but had profuse bright red blood per rectum overnight and  in the early morning today.  He had several bright red bloody bowel  movements of loose stool.  He denies recent hemorrhoids and denies a  history of diverticulosis.  He denies nausea, vomiting, fever or chills.  He denies hematemesis or hemoptysis.  He denies a history of peptic  ulcer disease.  He denies NSAID overuse.  The patient was seen by his  primary care Sultan Pargas and referred for admission for evaluation by  gastroenterology and monitoring for acute blood loss anemia.   PAST MEDICAL HISTORY:  1. Nonischemic cardiomyopathy with ejection fraction 15-20%.  2. Compensated CHF.  3. Hypertension.  4. Hyperlipidemia.  5. Diabetes mellitus.   CURRENT MEDICATIONS:  1. Enalapril 5 mg b.i.d.  2. Furosemide 40 mg b.i.d.  3. Glipizide 2.5 mg daily.  4. Digoxin 0.125 mg daily.  5. Coreg 3.125 mg b.i.d.  6. Aldactone 25 mg daily.   FAMILY HISTORY:  Positive for hypertension and diabetes.   SOCIAL HISTORY:  The patient drinks occasional alcohol and sometimes in  significant amounts.  The patient smokes three cigarettes per day.  Works as a Dealer.  Denies illicit drug  use.   PHYSICAL EXAMINATION:  Blood pressure is 96/60, pulse 83, temperature  98.1 degrees Fahrenheit, saturating 97% on room air, respirations 20.  GENERAL:  No acute distress.  A well-developed, well-nourished [place]  American male.  CARDIOVASCULAR:  Regular rate and rhythm without murmur, rub, or gallop.  NECK:  Supple without jugular venous distention.  LUNGS:  Clear to auscultation bilaterally without wheezes, rales or  rhonchi.  HEENT:  Oropharynx clear.  Extraocular movements intact.  ABDOMEN:  Obese, nontender to palpation, positive bowel sounds, no  organomegaly and no masses appreciated.  EXTREMITIES:  No clubbing, cyanosis, or edema.  SKIN:  No rashes or ulcerations.  RECTAL:  Dark stool with no formed stool in the vault.  No palpated  hemorrhoids or visualized hemorrhoids externally.  Hemoccult is  positive.   LABS AND STUDIES:  CBC shows a white count of 7.2, hemoglobin at 9,  platelets at 176.  Glucose of 143.  Otherwise laboratories were within  acceptable limits.   ASSESSMENT AND PLAN:  This is a 45 year old African American male with a  history of diabetes and hypertension, who presents with bright red blood  per rectum.   1. Admit for observation and evaluation of acute blood loss anemia      secondary to lower gastrointestinal bleed.  Gastroenterology has      evaluated the patient in the emergency department and plans      endoscopy in the morning.  We will volume-expand the patient due to      his hypotension with careful observation given his history of      reduced ejection fraction due to cardiac insufficiency.  We will      keep the patient n.p.o. after midnight and provide him with clears      prior to midnight.  We will check q.8 h. CBCs and type and screen      for possible need of blood transfusion.  We will hold his      antihypertensives except for his digoxin at this time as we will      try to attempt rate control.  If he does need rate control,  we will      add back his beta blocker first and pay close attention to his      blood pressure.  2. Diabetes mellitus.  We will place on sliding scale insulin.      Bartholomew Boards, MD  Electronically Signed     EWR/MEDQ  D:  02/17/2008  T:  02/17/2008  Job:  [id number]

## 2011-04-08 NOTE — Discharge Summary (Signed)
NAMERONEL, HABASH               ACCOUNT NO.:  [id number]   MEDICAL RECORD NO.:  [id number]          PATIENT TYPE:  INP   LOCATION:  6709                         FACILITY:  [facility]   PHYSICIAN:  Vernell Leep, MD     DATE OF BIRTH:  1966-05-07   DATE OF ADMISSION:  02/16/2008  DATE OF DISCHARGE:                               DISCHARGE SUMMARY   PRIMARY MEDICAL DOCTOR:  Lucianne Lei, MD.   DISCHARGE DIAGNOSES:  1. Gastrointestinal bleed.  Source not determined.  2. Antral erosions and superficial gastritis.  3. Acute blood loss anemia, status post 2 units of packed red blood      cell transfusion.  4. Diabetes mellitus.  5. Hypertension.  6. Compensated congestive heart failure secondary to nonischemic      cardiomyopathy.   DISCHARGE MEDICATIONS:  1. Digoxin 0.25 mg p.o. daily .  2. Lasix 40 mg p.o. daily.  3. Janumet at previous home dose.  4. Protonix 40 mg p.o. daily.   His medications held until followup with PMD early next week are:  1. Enalapril.  2. Colchicine.  3. Coreg.   PROCEDURES:  1. Colonoscopy by Dr. Collene Mares.  Impression:  A few scattered diverticula.      Poor preparation.  No fresh or old blood.  Small internal      hemorrhoids.  Recommended outpatient followup and capsule study.  2. Esophagogastroduodenoscopy by Dr. Collene Mares with superficial gastritis      and antral erosions.  Recommended PPI .   PERTINENT LABS:  CBC:  Hemoglobin 10.6, hematocrit 31.1, white blood  cells 6.2, platelet 206.  Coagulation indicates normal.  Basic metabolic  panel unremarkable with a BUN of 7, creatinine 0.83.  Anemia panel with  iron of 22, total iron-binding capacity 328, percentage saturation 7, B-  12 70, serum folate 14.5, ferritin 44, hemoglobin A1c of 7.8.  Hepatic  panel unremarkable except for a albumin of 2.7.   CONSULTATIONS:  Gastroenterology, Dr. Collene Mares.   HOSPITAL COURSE AND PATIENT DISPOSITION:  Please refer to the history  and physical note for initial admission  details.  In summary, Mr. Lindenmeyer  is a 45 year old African American male patient with history of diabetes,  hypertension, nonischemic cardiomyopathy, compensated CHF,  hyperlipidemia who presented with several episodes of bright red  bleeding per rectum.  He was admitted for further evaluation.  1. GI bleed.  The patient was admitted to the hospital.  His H&H were      frequently checked.  He was placed on clear liquids.  A GI consult      was obtained.  His antihypertensive medications were held because      of lowish blood pressures.  He was placed on IV fluids.  GI      proceeded to do an upper GI endoscopy first, with findings as      above, and then lower endoscopy this afternoon.  No clear source of      bleeding has been determined.  The patient obviously will need      further workup.  Dr. Collene Mares has indicated that the  patient can be      discharged and should follow up with her as an outpatient for a      capsule study.  The patient is instructed to seek immediate medical      attention if there is any further deterioration of his condition.      He has been advised to avoid NSAIDs and alcohol.  2. Acute blood loss anemia.  The patient had dropped hemoglobin to      8.2, following which he was transfused with 2 units of blood, with      appropriate response.  The patient is to follow up with his PMD      early next week with repeat CBCs.  3. Diabetes has been poorly controlled as an outpatient.  The patient      indicates he takes Janumet alone, the dose of which is not clear.      The patient is to continue the same and will need adjustment of his      meds as an outpatient.  4. Hypertension.  The patient has been running low normal blood      pressures in the 100s to 110s/60s to 80s.  His lisinopril and Coreg      have been temporarily held.  He is to follow up with his PMD and      consider resuming if his blood pressures are better.  5. Compensated congestive heart failure.   Will resume Lasix.  6. Antral erosions and superficial gastritis.  Proton pump inhibitor      and avoid NSAIDs, alcohol and tobacco use.      Vernell Leep, MD  Electronically Signed     AH/MEDQ  D:  02/18/2008  T:  02/18/2008  Job:  [id number]   cc:   Nelwyn Salisbury, M.D.  Lucianne Lei, M.D.

## 2011-04-08 NOTE — Op Note (Signed)
NAMEGAEGE, DAN               ACCOUNT NO.:  [id number]   MEDICAL RECORD NO.:  [id number]          PATIENT TYPE:  INP   LOCATION:  6709                         FACILITY:  [facility]   PHYSICIAN:  Nelwyn Salisbury, M.D.  DATE OF BIRTH:  12-21-1965   DATE OF PROCEDURE:  02/17/2008  DATE OF DISCHARGE:                               OPERATIVE REPORT   PROCEDURE PERFORMED:  Esophagogastroduodenoscopy with antral biopsies.   ENDOSCOPIST:  Nelwyn Salisbury, M.D.   INSTRUMENT USED:  Pentax video panendoscope.   INDICATIONS FOR PROCEDURE:  45 year old African American male with a  history of diabetes, hypertension, and congestive heart failure,  undergoing an EGD for black stools.  The patient had an almost 8 gram  drop in hemoglobin, rule out peptic ulcer disease, esophagitis,  gastritis, etc.   PRE-PROCEDURE PREPARATION:  Informed consent was procured from the  patient. The patient was fasted for 8 hours prior to the procedure.  The  risks and benefits of the procedure were discussed with the patient in  great detail.   PRE-PROCEDURE PHYSICAL:  The patient had stable vital signs.  Neck  supple.  Chest clear to auscultation.  S1 and S2 regular.  Abdomen soft  with normal bowel sounds.   DESCRIPTION OF PROCEDURE:  The patient was placed in the left lateral  decubitus position and sedated with 150 mcg of Fentanyl and 7.5 mg of  Versed given intravenously in slow incremental doses.  Once the patient  was adequately sedated and maintained on low flow oxygen and continuous  cardiac monitoring, the Pentax video panendoscope was advanced through  the mouthpiece over the tongue into the esophagus under direct vision.  The entire esophagus was widely patent with no evidence of ring,  stricture, mass, esophagitis or Barrett's mucosa.  The scope was  advanced into the stomach.  No fresh or old blood was seen in the  stomach or the proximal small bowel. A superficial ulcer with antral  erosions was  noted in the distal stomach.  Biopsies were done to rule  out the presence of H. pylori by pathology.  The proximal small bowel  appeared normal.  There was no outlet obstruction.  The patient  tolerated the procedure well without immediate complications.   IMPRESSION:  1. Normal appearing esophagus and gastroesophageal junction.  2. Antral erosions with a superficial ulcer noted in the prepyloric      area, biopsies done to rule out H. pylori.  3. Normal proximal small bowel.   RECOMMENDATIONS:  1. Await pathology results.  2. Avoid all nonsteroidals for now.  3. Proceed with a colonoscopy at this time.  The patient will be      prepped and the procedure will be done      tomorrow.  Further recommendations made in follow up.  4. Continue serial CBCs number  5. Transfusion to keep hemoglobin above 10 gm/dL.      Nelwyn Salisbury, M.D.  Electronically Signed     JNM/MEDQ  D:  02/17/2008  T:  02/18/2008  Job:  [id number]   cc:   Lucianne Lei,  M.D. 

## 2011-04-08 NOTE — Consult Note (Signed)
NAMEZACHAREY, [name]               ACCOUNT NO.:  [id number]   MEDICAL RECORD NO.:  [id number]          PATIENT TYPE:  INP   LOCATION:  6709                         FACILITY:  [facility]   PHYSICIAN:  Nelwyn Salisbury, M.D.  DATE OF BIRTH:  [birth year]   DATE OF CONSULTATION:  02/16/2008  DATE OF DISCHARGE:                                 CONSULTATION   REASON FOR CONSULTATION:  Blood in stool.   ASSESSMENT:  1. Blood in stool.  Rule out peptic ulcer disease, arteriovenous      malformations, colonic polyps, masses, etc.  2. History of nonsteroidal abuse.  The patient takes 5-10 Aleve per      day for ankle pain about 3-4 times per week.  3. History of adult-onset diabetes mellitus on Janumet.  4. Hypertension on Enalapril.  5. History of congestive heart failure on Lasix and digoxin.  6. History of bilateral forearm fractures in the past.  7. History of ankle fractures 12 years ago with surgery done at that      time.  The patient has had open fixation with pins placed.  8. History of alcohol abuse with a 6-pack per day.   RECOMMENDATIONS:  1. A clear liquid diet for now.  2. Protonix 40 mg IV daily.  3. He will be n.p.o. after midnight for EGD in the a.m.  4. Avoid all nonsteroidals for now.  5. Check coagulations and hepatic function panel.  Rule out      coagulopathy with history of alcohol abuse.   HISTORY OF PRESENT ILLNESS:  David Gregory is a 45 year old, very  pleasant, African American male with the above-mentioned problems who  was in his usual state of health until February 14, 2008, when at about 6  p.m., he started noticing some blood in his stool.  He had several loose  bowel movements at night and claims it was mostly blood.  He felt a  little better on Monday morning, February 15, 2008, and his symptoms seemed  to abate.  He felt constipated and took a laxative and then had several  bowel movements again that evening with darker blood in them.  He denies  a history of  frank melena.  The bleeding has continued since then and  through the night yesterday.  He felt dizzy at work and therefore  presented to his PCP, Dr. Lucianne Lei, for further evaluation.  He was  found to be hypotensive and had orthostatic changes with his blood  pressure in the office and was advised to come to [facility] for  emergency admission and evaluation from a GI standpoint.  Dr. Louis Matte has  been contacted, with the [facility] service, who is going to admit the  patient for Dr. Criss Rosales.  I have discussed my evaluation of the patient  with him over the phone.  The patient denies any abdominal pain, nausea,  vomiting, fevers, chills or rigors.  His appetite has been fairly good.  There is no history of abnormal weight loss or weight gain.  He uses 5-  10 Aleve per day three times a  week for ankle pain.  There is no  previous history of ulcer disease.  Denies a family history of colon  cancer.  He drinks a 6-pack of beer per day on a regular basis, if not  more.   PAST MEDICAL HISTORY:  See list above.   ALLERGIES:  No known drug allergies.   MEDICATIONS:  Lasix, Janumet, Enalapril, digoxin, Aleve.  The patient  does not remember the dosages.   SOCIAL HISTORY:  He is a single father of two.  He smokes 3-4 cigarettes  per day and drinks a 6-pack per day.  He is employed as an Programmer, multimedia.   FAMILY HISTORY:  Noncontributory.   PHYSICAL EXAMINATION:  GENERAL:  This is a very pleasant, middle-aged,  African American male, sitting up in a chair in no acute distress.  VITAL SIGNS:  Stable except for blood pressure of 96/60, temperature  98.1, pulse 83 per minute, respirations 20.  HEENT:  Atraumatic, normocephalic head.  Facial symmetry intact.  Oropharyngeal mucosa without exudates.  NECK:  Supple.  No JVD, thyromegaly or lymphadenopathy.  CHEST:  Clear to auscultation bilaterally.  HEART:  S1, S2 regular.  No murmurs, rubs or gallops.  ABDOMEN:  Soft., obese, nontender with  normal bowel sounds.  I could not  appreciate any hepatosplenomegaly as the patient was not in a recumbent  position.  He was sitting in a chair.  RECTAL:  Digital rectal examination was not done as the patient was in  the triage area and had not been assigned.   LABORATORY DATA AND X-RAY FINDINGS:  Labs have been drawn and no results  available at this time.   Plan as above.  Further recommendations to follow up.      Nelwyn Salisbury, M.D.  Electronically Signed     JNM/MEDQ  D:  02/16/2008  T:  02/17/2008  Job:  [id number]   cc:   Lucianne Lei, M.D.

## 2011-04-10 ENCOUNTER — Encounter: Payer: Self-pay | Admitting: Internal Medicine

## 2011-04-10 NOTE — Telephone Encounter (Signed)
error 

## 2011-04-11 NOTE — H&P (Signed)
David Gregory, David Gregory               ACCOUNT NO.:  [id number]   MEDICAL RECORD NO.:  [id number]          PATIENT TYPE:  EMS   LOCATION:  MAJO                         FACILITY:  [facility]   PHYSICIAN:  Mobolaji B. Bakare, M.D.DATE OF BIRTH:  07/30/1966   DATE OF ADMISSION:  01/23/2006  DATE OF DISCHARGE:                                HISTORY & PHYSICAL   PRIMARY CARE PHYSICIAN:  Dr.  Alyson Ingles.   CHIEF COMPLAINT:  High blood sugar.   HISTORY OF PRESENTING COMPLAINT:  David Gregory is a 45 year old African  American male who has a history of type 2 diabetes mellitus, nonischemic  cardiomyopathy, hypercholesterolemia, hypertension, ejection fraction of 15  to 20% on cardiac catheterization in 2006, January.  The patient stated that  he has not been using his medications for the past two weeks because he has  not had time to go to the doctor.  In addition, he has been drinking heavily  for about two weeks liquor (bourbon and [employer]).  He drinks because of his  stressful job.  On arrival in the emergency room, his blood sugar was 687.  He has received  regular insulin 10 units and he is currently on insulin infusion.  Blood  sugar has trended down and currently 260 mg/dL.  The patient is not ketotic.  His urinalysis showed no ketones.  His bicarb is 21 and anion gap of 15.  David Gregory is adamant that he does not want to be hospitalized and he wants  to go home.  He is prepared to sign AMA.  I discussed the implication of  this with him and he did understand the risk of going home without  adequately controlling his blood sugar.  He stated he would follow up with  Dr. Alyson Ingles on Monday.  He will be given his p.o. medications prior to  going home and prescriptions will be written for all his home medications.  Apparently, he could not get prescriptions for his medications and that is  because he has not been compliant with followup as he has been out of  medications for two weeks.  The patient  received IV fluid, a total intake of 1,200 cc.  His creatinine  is 1.3.   REVIEW OF SYSTEMS:  No fever or cough, chest pain, shortness of breath.   PAST MEDICAL HISTORY:  1.  Nonischemic cardiomyopathy with ejection fraction of 15 to 20%.  2.  Compensated CHF.  3.  Hypertension.  4.  Hyperlipidemia.  5.  Diabetes mellitus.   CURRENT MEDICATIONS:  1.  Enalapril 5 mg b.i.d.  2.  Furosemide 40 mg b.i.d.  3.  Glipizide 2.5 mg daily.  4.  Digoxin 0.125 mg daily.  5.  Coreg 3.125 mg b.i.d.  6.  Aldactone 25 mg daily.   FAMILY HISTORY:  Noncontributory.   SOCIAL HISTORY:  The patient drinks alcohol.  He drinks significantly, hard  liquor.  He smokes cigarettes less than a pack a week.  He is a Dealer.   PHYSICAL EXAMINATION:  VITAL SIGNS:  Initial temperature 97.2, blood  pressure 135/87, pulse of  112, currently 85, respiratory rate of 20, O2 sats  of 96%.  GENERAL:  The patient is not in respiratory distress.  HEENT:  Normocephalic atraumatic head.  Pupils equal, round, and reactive to  light.  NECK:  No JVD.  No thyromegaly.  LUNGS:  Clear clinically to auscultation.  CARDIOVASCULAR:  S1 S2 regular.  No murmur.  No gallop.  No rub.  ABDOMEN:  Obese, soft, nontender.  Bowel sounds present.  EXTREMITIES:  No pedal edema.  No CVA tenderness.  CNS:  Nonfocal.  SKIN:  No rash.  No petechiae.   LABORATORY DATA:  Initial sodium 125, potassium 4.5, chloride 89, CO2 21,  glucose 687, BUN 17, creatinine 1.3, calcium 9.5.  Urinalysis showed glucose  greater than 1,000, clear in appearance, specific gravity of 1.025, negative  nitrite and leukocytes.  Digoxin level 1.1.  Hemoglobin 19.4, hematocrit  57.0.  Creatinine 0.3 on I-STAT and 1.3 on B-MET.   ASSESSMENT/PLAN:  David Gregory is a 45 year old African American male with a  history of diabetes mellitus who has been on glipizide 2.5 mg daily.  He is  noncompliant with followup and he has been out of medications for two weeks.  He is  now presenting with uncontrolled diabetes mellitus, non-ketotic.   The patient is going to sign AMA but I asked him to follow up with Dr.  Alyson Ingles in the office on Monday.  He does have Glucometer at home and he  knows how to use it.  His Mom helps him to get readings.   He will go home on:  1.  Glipizide 2.5 mg p.o. daily.  2.  Metformin 850 mg daily.   He should check his hemoglobin A1c with Dr. Alyson Ingles.  These medications can  be up titrated as necessary.  He will continue all other home medications.      Mobolaji B. Maia Petties, M.D.  Electronically Signed     MBB/MEDQ  D:  01/23/2006  T:  01/23/2006  Job:  [id number]   cc:   McKenzie, Dr.

## 2011-04-11 NOTE — Op Note (Signed)
David Gregory, David Gregory               ACCOUNT NO.:  [id number]   MEDICAL RECORD NO.:  [id number]          PATIENT TYPE:  INP   LOCATION:  6709                         FACILITY:  [facility]   PHYSICIAN:  Nelwyn Salisbury, M.D.  DATE OF BIRTH:  07-Mar-1966   DATE OF PROCEDURE:  02/21/2008  DATE OF DISCHARGE:  02/18/2008                               OPERATIVE REPORT   PROCEDURE PERFORMED:  Screening colonoscopy.   ENDOSCOPIST:  Nelwyn Salisbury, M.D.   INSTRUMENT USED:  Pentax video colonoscope.   INDICATIONS FOR PROCEDURE:  A 45 year old [place] American male with a  history of hypertension, adult-onset diabetes, hyperlipidemia, obesity  and rectal bleeding undergoing a colonoscopy.  The patient has had an 8  gram drop in hemoglobin.  Rule out colonic polyps, masses, etc.   PREPROCEDURE PREPARATION:  Informed consent was procured from the  patient.  The patient fasted for 4 hours prior to the procedure after  being prepped with a bottle of magnesium citrate and a gallon of  NuLytely the night prior to procedure.  The risks and benefits of the  procedure including a 10% miss rate of cancer and polyps were discussed  with the patient as well.   PREPROCEDURE PHYSICAL:  The patient had stable vital signs.  Neck  supple.  Chest clear to auscultation.  S1, S2 regular.  Abdomen soft  with normal bowel sounds.   DESCRIPTION OF PROCEDURE:  The patient was placed in the left lateral  decubitus position and sedated with 100 mcg of Fentanyl and 10 mg of  Versed given intravenously in slow incremental doses.  Once the patient  was adequately sedated and maintained on low-flow oxygen and continuous  cardiac monitoring the Pentax video colonoscope was advanced from the  rectum to cecum with difficulty.  There was significant amount of  residual stool in the colon.  Multiple washes were done.  Scattered  diverticula were noted.  The appendiceal orifice and ileocecal valve  were clearly visualized and  photographed.  The terminal ileum appeared  healthy without lesions.  No masses, polyps, erosions, ulcerations, etc.  were noted.  Retroflexion in bowel.  Small internal hemorrhoids.  The  patient tolerated the procedure well without complications.   IMPRESSION:  1. Small internal hemorrhoid.  2. Scattered diverticulosis.  3. Significant amount of residual stool in the colon.  Small lesions      could be missed.  4. No masses or polyps seen.   RECOMMENDATIONS:  1. Continue high fiber diet with liberal fluid intake.  2. Check CBC prior to discharge today.  3. Capsule study will be planned on outpatient basis and further      recommendations made in follow-up.  4. The patient has been advised to avoid all nonsteroidals for now.      Nelwyn Salisbury, M.D.  Electronically Signed     JNM/MEDQ  D:  02/21/2008  T:  02/21/2008  Job:  [id number]   cc:   Lucianne Lei, M.D.

## 2011-04-11 NOTE — Discharge Summary (Signed)
NAMEBISHOP, HASBROOK NO.:  [id number]   MEDICAL RECORD NO.:  [id number]          PATIENT TYPE:  INP   LOCATION:  2031                         FACILITY:  [facility]   PHYSICIAN:  Idolina Primer, MD       DATE OF BIRTH:  [birth year]   DATE OF ADMISSION:  12/09/2004  DATE OF DISCHARGE:  12/16/2004                                 DISCHARGE SUMMARY   DISCHARGE DIAGNOSES:  1.  Cardiogenic shock.  2.  Acute renal failure.  3.  Heart failure (systolic dysfunction with an ejection fraction of 15 to      20%).  4.  Non-ischemic cardiomyopathy.  5.  Hypertension.  6.  Diabetes mellitus type 2 (newly diagnosed).  7.  Hypothyroidism (newly diagnosed with TSH of 7.0).  8.  History of alcohol abuse.  9.  Tobacco abuse.  10. Marijuana use.   DISCHARGE MEDICATIONS:  1.  Enalapril 5 mg one tablet twice daily.  2.  Lasix 40 mg one tablet twice daily.  3.  Glipizide 2.5 mg one tablet daily.  4.  Digoxin 0.125 mg one tablet daily.  5.  Coreg 3.125 mg by mouth twice daily.  6.  Aldactone 25 mg one tablet by mouth once daily.   PROCEDURES:  1.  December 09, 2004:  Chest x-ray, two view, findings of cardiomegaly,      minimal atelectasis, no consolidations or effusions, no pulmonary edema.  2.  December 10, 2004:  Two view chest x-ray with no changes of chest x-ray      on January 16.  3.  December 10, 2004:  Two view x-ray of the abdomen with findings of air      fluid levels in the colon and no other findings noted.  4.  December 10, 2004:  Renal ultrasound with findings of mildly increased      renal parenchymal echotexture suggestive of medical renal disease, trace      ascites.  5.  December 10, 2004:  Ventilation perfusion scan with findings negative for      perfusion defect.  6.  December 11, 2004:  Portable one view chest x-ray with findings of no      acute changes from previous films on January 16 and January 17.  7.  December 12, 2004:  One view portable chest x-ray with  findings of small      left effusion, otherwise no changes from previous.  8.  December 16, 2004:  Two view chest x-ray with findings of stable      cardiomegaly, bibasilar linear atelectasis.  9.  December 10, 2004:  2D echocardiogram showing a moderately dilated left      ventricle, severely reduced systolic function with an ejection fraction      estimated to be 10 to 20%, no significant changes from an echocardiogram      done on July 30, 2004.  10. December 12, 2004:  Coronary artery catheterization showing no      significant stenoses seen consistent with non-ischemic cardiomyopathy.  11. Venous Doppler ultrasound of the lower extremities with findings of  no      lower extremity deep venous thrombosis or superficial thrombosis.   CONSULTATIONS:  1.  The patient was followed by the pulmonary critical care medicine team      while in the intensive care unit.  2.  [facility] followed the patient throughout his hospital stay.  3.  Dr. Lattie Haw made discharge recommendations regarding medications.   HISTORY AND PHYSICAL:  In brief, the patient is a 45 year old gentleman with  a past medical history most significant for hypertension, obesity and heart  failure secondary to cardiomyopathy of unknown etiology for greater than ten  years.  He presented to the [facility] ED on December 09, 2004 with  a complaint of chest pain and shortness of breath for approximately one to  two days prior to admission. He described retrosternal chest pain to 10 out  of 10.  He also complained of severe shortness of breath as well as pre-  syncope which he called lightheadedness.  The patient also related  diarrhea times two days prior to admission as well as one episode today.  The patient associates his current condition with the use of antibiotics  which he received from his primary care physician, Dr. Tomma Lightning, at  [facility].  He states that on Friday, December 06, 2004 he went for  his  routine checkup to receive medication refills for his known heart failure.  He mentioned to Dr. Tomma Lightning that he had a little bit of chest cold.  He  described this as a cough which was slightly productive and was given a  prescription for Azithromycin.  The patient took the medication as directed  with one dose on Friday, a dose on Saturday and a dose on Sunday as well as  a dose on Monday.  His symptoms began on Sunday, January 15, and by Monday,  January 16, he returned to the office to see Dr. Tomma Lightning because he  complained that he could barely walk across the room and felt like he was  going to fall out.   PHYSICAL EXAMINATION ON ADMISSION:  VITAL SIGNS:  Temperature 98.9, pulse  72, blood pressure 87/64, respirations 22, 02 saturation 98% on room air.  GENERAL:  The patient was lying on a bed in pain, agitated and upset.  EYES:  Pupils equal, round and reactive to light and accommodation.  Extraocular movements are intact.  ENT:  Dry mucous membranes.  NECK:  Supple with no thyromegaly.  RESPIRATORY:  Lungs are clear to auscultation bilaterally.  CARDIOVASCULAR:  Regular rate and rhythm.  GI:  Soft and non-tender with normal active bowel sounds.  EXTREMITIES:  Trace edema bilaterally.  SKIN:  Warm and dry.  LYMPH:  No lymphadenopathy.  MUSCULOSKELETAL:  No joint swelling.  NEUROLOGIC:  The patient is alert and oriented times three and moves all  four extremities without difficulty.  PSYCHIATRIC:  Appropriate affect.   LABORATORY DATA:  Sodium 133, potassium 5.3, chloride 103, bicarbonate 20,  BUN 64, creatinine 4.6, glucose 179.  White blood cell count 8.5, hemoglobin  13.6, hematocrit 43.0, platelets 245,000, neutrophil count of 6.6, MCV 91.0.  Bilirubin 0.9, phosphate 8.9, AST 209, ALT 149, protein 6.9, albumin 3.4,  calcium 8.4.  D-dimer was 17.25.  BNP was 851.8.  Creatinine kinase 205, MB 19.2, Troponin I 1.52.  Arterial blood gas was 7.352/25/92/13/97%.   HOSPITAL  COURSE:  1.  CARDIOGENIC SHOCK.  The patient presented hypotensive with elevated      cardiac enzymes as well  as an elevated D-dimer as well as an elevated      creatinine.  The differential diagnoses included ischemia secondary to      acute MI as well as pulmonary embolus.  The patient was admitted to the      cardiac intensive care unit where he received pressors to support his      blood pressure.  He was also anticoagulated with heparin.  He received a      VQ scan and venous Dopplers of the lower extremities as well as coronary      catheterization and all of these tests did not yield an inciting insult      for his presentation.  We also considered the possibility of acute      decompensation of his known congestive heart failure as well as his      recent use of a macrolide antibiotic.  There has been some evidence in      the literature to associate erythromycin with cardiac arrhythmias.      However, it is impossible to know if erythromycin played a role in this.      The patient improved steadily in the ICU and was weaned from pressor      support.  By hospital day number four, the patient was stable and moved      to a regular floor.  He continued to complain of lower extremity edema      which he described was worse then he had experienced at home. This was      treated with his normal dose of Lasix 40 mg b.i.d. as well as      supplemental Lasix to relieve the tense edema.  The patient had no      episodes of chest pain or further shortness of breath throughout the      hospital course.  He was followed by cardiology throughout his hospital      stay as well.  Although it is impossible to know for sure what the      initial insult leading to his cardiogenic shock was, it was thought the      most likely cause was an acute decompensation of his known heart      failure.  His medication regimen has been adjusted as reflected in the      discharge medication section and the  patient will follow-up with the      heart failure clinic at [facility] for further outpatient      management of his severe systolic dysfunction. Of note, cardiology      consultation on discharge day suggested anticoagulation with Coumadin      due to his impaired ejection fraction of less than 20%. However, it was      decided that he would start this therapy as an outpatient where he could      have his Protime INR monitored.  He was also started on a low dose of      Digoxin and this will likely need to be titrated and monitored as an      outpatient.  2.  ACUTE RENAL FAILURE.  The patient presented with a creatinine of 4.6      which was well above his baseline of 0.8 to 1.0.  Throughout his      hospital stay his renal function continued to improve such that his      creatinine had returned to 0.8.  We believe that this  acute renal     failure was most likely secondary to hypoperfusion as a result of his      cardiac failure.  3.  DIABETES MELLITUS TYPE 2.  The patient's blood glucose continued to run      in the 150's to 200 range throughout his admission. He was placed on      sliding scale insulin.  After further discussion with the patient, he      acknowledged that his primary care physician had discussed with him that      his sugars had been running high and that he had pre-diabetes.  It is      possible that the acute stress of the hospitalization has exacerbated      his hyperglycemia.  However, we have started him on a hypoglycemic oral      agent at a low dose for which he should be monitored as an outpatient      for the possible addition of other therapy for his diabetes.  4.  HYPOTHYROIDISM.  The patient was noted to have a TSH of 7.8 which was      slightly high.  We did not start him on Levothyroxine due to the mild      elevation as well as the possible contribution of the acute stress of      the hospital as contributing to this.  This should be followed up  in the      outpatient setting for the possible addition of Levothyroxine if this      remains persistently elevated.  5.  ALCOHOL ABUSE.  The patient related an approximately 24 year per week      drinking history.  He was counseled extensively regarding the possible      contribution of alcohol to his worsening heart failure and was advised      to at minimum decrease his intake and ideally ceasing all alcohol      intake.  6.  BACTEREMIA.  The patient was noted to have gram positive organisms seen      on his blood cultures drawn at admission. He was started empirically on      vancomycin.  It was subsequently discovered that these were gram      positive rods in one of the two bottles.  The vancomycin was      discontinued due to the probable contaminant source of the bacteria.   DISCHARGE LABORATORIES:  Sodium 137, potassium 3.3., chloride 105,  bicarbonate 23, glucose 140, BUN 17, creatinine 1.1, calcium 8.6.   DISPOSITION:  1.  The patient will follow-up with Dr. Nani Ravens of [facility] on      December 27, 2004 at 2:15 p.m.  2.  Primary care follow-up with Dr. Tomma Lightning at [facility] on Friday,      December 20, 2004 at 3:30 p.m.      HP/MEDQ  D:  12/17/2004  T:  12/17/2004  Job:  [id number]   cc:   Claiborne Billings L. Tomma Lightning, M.D.  [phone] [address]  [place] [address]  Fax: [phone]

## 2011-04-11 NOTE — Discharge Summary (Signed)
NAME:  David Gregory, David Gregory                         ACCOUNT NO.:  [id number]   MEDICAL RECORD NO.:  [id number]                   PATIENT TYPE:  INP   LOCATION:  5502                                 FACILITY:  [facility]   PHYSICIAN:  Randa Spike, MD               DATE OF BIRTH:  [birth year]   DATE OF ADMISSION:  07/27/2004  DATE OF DISCHARGE:  07/30/2004                                 DISCHARGE SUMMARY   ATTENDING PHYSICIAN:  Wells Guiles L. Alysia Penna, M.D.   DISCHARGE DIAGNOSES:  1.  Congestive heart failure .  2.  Hypertension.   DISCHARGE MEDICATIONS:  1.  Atenolol 50 mg daily.  2.  Enalapril 10 mg daily.  3.  Loratadine 10 p.o. daily.  4.  __________ 25 mg p.o. daily.  5.  Lasix 80 mg b.i.d.  6.  K-Dur 20 mEq daily.   HISTORY OF PRESENT ILLNESS:  Mr. Rashed is a 45 year old African American  male with a history of congestive heart failure diagnosed about 10 years  ago.  He presented to the ED complaining of improved shortness of breath for  the past two months.  He is also complaining of increased lower extremity  edema as well as abdominal distention over the past few weeks.   PHYSICAL EXAMINATION:  VITAL SIGNS:  Temperature 97.3, pulse 77, respiratory  rate 18, blood pressure 115/85, O2 saturation 98 to 99% on room air.  NECK:  No JVD.  CHEST:  No increased work of breathing.  ABDOMEN:  Mildly distended, nontender, tympanic with no fluid wave.  EXTREMITIES:  He had a 3+ pretibial edema bilaterally.   HOSPITAL COURSE:  PROBLEM #1 -  CONGESTIVE HEART FAILURE:  We treated the  patient with intravenous Lasix 160 mg IV for two days.  He had a good  diuresis through his admission and also had almost complete resolution of  his lower extremity pretibial edema.  The patient also went to have an  echocardiogram.  Results of the echocardiogram  are pending, to be followed  by his primary care physician at [facility].   PROBLEM #2 -  HYPERTENSION:  Blood pressures remained in good  control with  __________ during admission.   PROBLEM #3 -  We also put the patient on prophylaxis for deep venous  thrombosis and with Lovenox and we discontinued Lovenox at the time of  discharge.   DISCHARGE LABORATORY DATA:  The patient had a chest x-ray that showed  cardiac enlargement and vascular congestion without edema or effusion.   He also had a metabolic panel that showed sodium 136, potassium 3.4,  chloride 96, CO2 30, urea 16 and creatinine 1, glucose 125 and calcium 8.5.  He also had cardiac markers within normal limits and a B natriuretic peptide  of 491.6.   FOLLOW UP ISSUES:  Echocardiogram  was done on the day of discharge and  results are  to be followed up by  Kelly L. Tomma Lightning, M.D., at [facility].  The patient has an appointment for next Friday at 4:15 p.m.  It is  recommended to follow up echocardiogram  report an to perform another basic  metabolic panel.                                                Randa Spike, MD    AM/MEDQ  D:  08/01/2004  T:  08/01/2004  Job:  [id number]   cc:   Claiborne Billings L. Tomma Lightning, M.D.  [phone]  [place] [address]  Fax: [place] Alysia Penna, M.D.  [place]  [place] [address]  Fax: [phone]

## 2011-04-11 NOTE — Consult Note (Signed)
David Gregory, David [name]:  [id number]   MEDICAL RECORD NO.:  [id number]          PATIENT TYPE:  INP   LOCATION:  2903                         FACILITY:  [facility]   PHYSICIAN:  Scarlett Presto, M.D.   DATE OF BIRTH:  Apr 21, 1966   DATE OF CONSULTATION:  12/09/2004  DATE OF DISCHARGE:                                   CONSULTATION   CARDIOLOGY CONSULTATION:   PRIMARY CARE PHYSICIAN:  Dr. Tomma Lightning at [facility]; he has reportedly never  seen a cardiologist.   HISTORY OF PRESENT ILLNESS:  [name] is a 45 year old African-American  man who was presented to the ER complaining of shortness of breath and chest  discomfort.  He states that he was feeling poorly, thought he had an upper  respiratory infection and on Friday came to see his primary doctor, was  given an antibiotic and since that time has felt very short of breath with  progressive dyspnea and some chest discomfort which brought him to the ER  today.  He is currently in a hospital bed, states that he is very tired and  does not wish to discuss his symptoms with me nor is he interested in  providing me much of a history.  The history we get from his previous  admission history and physical states that he has been having chest  discomfort on and off over the course of the last few hours but his blood  pressure has been relatively low.  No specific other symptoms were  mentioned.  He was recently admitted in September 2005 for similar symptoms,  was evaluated, does not appear to have seen a cardiologist at that time and  was discharged.  He has a history of hypertension and tobacco abuse.  He has  a history of a cardiomyopathy which has been present for at least 5 years  with an ejection fraction on an echo done in September of 15-20% with  diffuse left ventricular hypokinesis and mild mitral regurgitation.  He  denies any illicit drug use.  He does drink alcohol.  He works as an Programmer, multimedia.  He is single,  lives by himself.  He is not allergic to any  medications.  His medications reportedly prior to admission were Enalapril  20 mg once a day, atenolol 50 mg once a day, Lasix 80 mg twice a day,  Aldactone 75 mg once a day, potassium chloride 20 mEq once a day and Zyrtec  10 mg once a day.  I am uncertain of what the antibiotic was.   REVIEW OF SYSTEMS/FAMILY HISTORY:  His review of systems was unobtainable as  was his family history.   PHYSICAL EXAMINATION:  He is a moderately obese African-American male in no  apparent distress.  Heart rate of 50, respiratory rate of 20, blood pressure  of 80/40, saturating 99% on 2 L when he wears the nasal cannula.  Examination of the head, ears, eyes, nose and throat is unremarkable.  His  dentition is slightly bad.  His neck is supple.  He has no jugular venous  distention or carotid  bruits.  His chest is clear to auscultation  bilaterally.  His cardiovascular exam is regular; he has a soft 2/6 systolic  murmur heard best at the apex.  His abdomen is protuberant but soft and  nontender.  GU and rectal exam are deferred.  His extremities are without  significant edema.  His pulses are all 1+.  His neurologic exam was not  performed.   STUDIES:  Chest x-ray shows cardiomegaly but no pulmonary edema.  His  electrocardiogram shows sinus rhythm at a rate of 72 with a right axis  deviation, inferior/inferolateral ST flattening but no obvious ischemic ST-T  wave changes and there is Q waves in the inferior leads which are  questionably diagnostic.   LABORATORIES:  White blood cell count 8.5, H&H of 16 and 48 with a platelet  count of 245.  Sodium 129, potassium 5.7, chloride of 104, bicarb of 14, BUN  of 62, creatinine of 4.3 and his blood sugar is 167.  His bilirubin is 0.9,  his AST is 209, his ALT is 145, alkaline phosphatase of 89.  His CK is 205,  his MB fraction is 19, and his troponin is 1.52.  INR of 1.4, PTT of 33.  His B-type natriuretic peptide  is 850.  His alcohol is negative.   IMPRESSION:  This is a gentleman who has a variety of things going on from a  cardiovascular standpoint.  He does have chest discomfort with an abnormal  troponin without an ischemic change on his EKG that seems obvious to me.  In  comparing with his old EKGs there is not a lot of change although his rate  is significantly slower.  He does have a metabolic acidosis of unknown  etiology.  The hypotension appears to be unclear to me of what its etiology  is as well.  He does have elevated D-dimer which is quite significant with a  blood gas that shows a significant respiratory component and the liver  function studies are abnormal.   RECOMMENDATIONS:  My recommendations are that a V/Q scan be performed,  certainly this could be a pulmonary embolus, it could explain the elevated  troponin, the chest discomfort, the shortness of breath and the elevated D-  dimer.  I agree with heparinizing him.  I think we should probably cycle his  cardiac enzymes and see what those show.  He will need an echo to assess his  LV function.  I have contacted Philippa Chester in critical care medicine to  evaluate this patient for his hypotension and I am going to start him on  some IV Dopamine in hopes of bringing his blood pressure up to a relatively  normal range.      Merry Proud   JH/MEDQ  D:  12/09/2004  T:  12/09/2004  Job:  [id number]

## 2011-04-11 NOTE — Cardiovascular Report (Signed)
NAMERONDIE, PARTEE NO.:  [id number]   MEDICAL RECORD NO.:  [id number]          PATIENT TYPE:  INP   LOCATION:  2927                         FACILITY:  [facility]   PHYSICIAN:  Eustace Quail, M.D. [facility] DATE OF BIRTH:  1966-04-20   DATE OF PROCEDURE:  12/12/2004  DATE OF DISCHARGE:                              CARDIAC CATHETERIZATION   CLINICAL HISTORY:  Mr. Rampey is 45 years old and has a known  cardiomyopathy.  He was admitted with hypotension, acidosis, and renal  insufficiency which improved with treatment with dobutamine and I believe  some hydration.  His creatinine was normal prior to this procedure.  He had  positive troponins and he was scheduled for a catheterization to rule out an  ischemic etiology to his recent admission symptoms.   PROCEDURE:  Right heart catheterization was performed percutaneously through  the right femoral vein using a mini sheath and Swan-Ganz thermodilution  catheter.  Left heart catheterization was performed percutaneously via the  right femoral artery using an arterial sheath and 6 French preformed  coronary catheters.  A front-wall arterial puncture was performed and  Omnipaque contrast was used.   RESULTS:  1.  The left main coronary artery is free of disease.  2.  The left anterior descending artery gave rise to two diagonal branches      and three septal perforators.  These were __________ were free of      significant disease.  3.  The circumflex artery gave rise to a ramus branch and two posterolateral      branches.  These vessels were free of significant disease.  4.  The right coronary artery is a moderately large vessel which gave rise      to a conus branch, a right ventricular branch, an acute marginal branch      which supplied part of the inferior septum and a short posterior      descending branch and a posterior lateral branch.  These vessels were      free of significant disease.  5.  No left ventriculogram  was performed.   HEMODYNAMIC DATA:  Right heart pressure was 24 mean.  The pulmonary artery  pressure was 78/43 with a mean of 56.  The pulmonary wedge pressure was 41  mean.  Left ventricular pressure was 131/50.  The aortic pressure was  131/100.  Cardiac output/cardiac index by Fick was 3.6/1.8 L/min/m sq.   CONCLUSION:  1.  Normal coronary angiography.  2.  Severe nonischemic cardiomyopathy with an ejection fraction of 15-20% by      echocardiography and markedly elevated pulmonary artery wedge pressure.   RECOMMENDATIONS:  Will plan continued medical therapy.  The patient will  need a vigorous but careful diuresis since he did develop hypotension and  renal insufficiency previously.       BB/MEDQ  D:  12/12/2004  T:  12/12/2004  Job:  [id number]   cc:   Claiborne Billings L. Tomma Lightning, M.D.  [phone]  [place] [address]  Fax: [phone]   Scarlett Presto, M.D.  Fax: [phone]  Thomas C. Wall, M.D.

## 2011-04-11 NOTE — H&P (Signed)
NAME:  David Gregory, David Gregory                         ACCOUNT NO.:  [id number]   MEDICAL RECORD NO.:  [id number]                   PATIENT TYPE:  INP   LOCATION:  5504                                 FACILITY:  [facility]   PHYSICIAN:  [place] A. Walker Kehr, M.D.                 DATE OF BIRTH:  Sep 05, 1966   DATE OF ADMISSION:  07/27/2004  DATE OF DISCHARGE:                                HISTORY & PHYSICAL   CHIEF COMPLAINT:  Shortness of breath.   HISTORY OF PRESENT ILLNESS:  David Gregory is a 45 year old African American  male with a history of congestive heart failure diagnosed at least ten years  ago, presents to the ED complaining of increasing shortness of breath times  the past 1-2 months.  He has been followed by his primary care physician at  [facility] for this problem.  Workup and treatment have included chest x-  ray which showed cardiomegaly but no infiltrates or fluid and increasing his  Lasix five days ago from 40 every day to 80 b.i.d.  He states that  increasing his Lasix has not helped.  He continues to be more short of  breath with exertion and better with rest.  He has a decrease in his ability  to do ADLs.  He sleeps sitting up and has noticed an increase in lower  extremity edema as well as abdominal distention, especially over the past  few weeks.   PAST MEDICAL HISTORY:  1.  Hypertension for greater than ten years.  2.  CHF, unable to find documentation.  3.  Chronic allergic rhinitis.   PRIMARY CARE PHYSICIAN:  [facility].   MEDICATIONS:  1.  Allegra 180 mg p.o. every day.  2.  Enalapril 10 mg p.o. every day.  3.  Spironolactone 25 mg p.o. every day.  4.  Lasix 40 mg every day, recently 80 mg b.i.d.  5.  Atenolol 50 mg p.o. every day.   ALLERGIES:  NKDA.   FAMILY MEDICAL HISTORY:  Father with a history of cardiomyopathy, initial MI  at the age of 38, died of an MI at the age of 77, also had hypertension and  diabetes.  Mother with diabetes.  Brothers and sisters are  healthy and  children ages 48 and 22 healthy.   SOCIAL HISTORY:  He lives alone, is a Pension scheme manager.  Tobacco, 1-2 cigarettes  a day for 20 years.  Positive for alcohol.  Admits to one 12 pack per week.  Denies illicit drug use.   REVIEW OF SYSTEMS:  Positive as in HPI, also cough x 2 months, mildly  productive with white sputum, no fevers.  Unsure of weight change, although  abdomen does feel more distended.  Denies any chest pain, chest tightness,  denies changes in his daily bowel movements.  No nausea, vomiting, diarrhea.   OBJECTIVE:  VITAL SIGNS:  Afebrile at 97.3, pulse 72-77, respiratory rate 18-  32, blood pressure systolic [id number], over diastolic [id number], O2 sat is 98-99%  on room air.  GENERAL:  This is an overweight black male, sitting on the side of the bed,  NAD, appears frustrated, wearing sunglasses.  HEENT:  PERRLA.  EOMI.  Oropharynx without lesion.  Poor dentition.  NECK:  Thick supple, no TM, no JVD appreciated, although appreciated earlier  by ED physician.  No lymphadenopathy.  CV:  RR.  LUNGS:  CTAB.  No increased work of breathing.  ABDOMEN:  __________  .  Soft, mildly distended, nontender.  Tympanic with  no fluid wave.  EXTREMITIES:  With +3 edema bilaterally pretibial.  SKIN:  With multiple tattoos on bilateral upper extremities.   LABS:  BNP is 491.6.  Sodium 136, potassium 4.5, chloride 102, CO2 24.5, BUN  21, creatinine 1.2, glucose 93.  I-STAT hemoglobin 18, hematocrit 53.  Point-  of-care enzymes negative.   Chest x-ray with cardiomegaly, no edema, no change since June 18, 2004.   ASSESSMENT/PLAN:  David Gregory is a 45 year old African American male.  1.  Shortness of breath.  This is most likely secondary to right sided heart      failure with his peripheral edema and abdominal distention.  We will      admit for increased dose of intravenous Lasix 160 mg intravenously now      and repeat in eight hours.  We will follow closely his diuresis by his       symptoms as well as strict input and output and daily weights.  We will      check a 2D echocardiogram.  It is likely that his recent resistance to      his Lasix is secondary to abdominal and gut edema and will need large      doses of Lasix to overcome this.  2.  Hypertension.  This is probably the cause of his congestive heart      failure, although congenital family history is also a possibility.  We      will continue on his home medications and follow closely, especially      with significant diuresis.  3.  Deep vein thrombosis prophylaxis.  We will provide prophylaxis with      Lovenox especially secondary to body habitus and decreased mobility.  4.  Congestive heart failure, right sided.  As per number one.  We will also      consider HCTZ if poor response to Lasix and discuss further with the      patient reduction of sodium.      Agustina Caroli, MD                        Patrick [place] A. Walker Kehr, M.D.    JS/MEDQ  D:  07/27/2004  T:  07/28/2004  Job:  [id number]

## 2011-04-17 ENCOUNTER — Encounter: Payer: Self-pay | Admitting: Internal Medicine

## 2011-05-29 ENCOUNTER — Telehealth: Payer: Self-pay | Admitting: Internal Medicine

## 2011-05-29 NOTE — Telephone Encounter (Signed)
Returning call back to heather.

## 2011-05-29 NOTE — Telephone Encounter (Signed)
Spoke w/pt he states he is doing pretty well, he did have some evidence of rejection w/last biopsy so they have him on a lot of prednisone but otherwise he is ok he will keep [place] updated

## 2011-06-11 ENCOUNTER — Telehealth: Payer: Self-pay | Admitting: Internal Medicine

## 2011-06-11 NOTE — Telephone Encounter (Signed)
Called patient back he says he has cut most salt out of his diet  He says that by the end of the day his feet and ankles are swollen(not unbearable and doesn't hurt) and they go down over night.  What can he do to help with this  I will forward to Dr Haroldine Laws and Nira Conn for review

## 2011-06-11 NOTE — Telephone Encounter (Signed)
Pt requesting a call from kelly, would not tell me why

## 2011-06-12 ENCOUNTER — Encounter: Payer: Self-pay | Admitting: Internal Medicine

## 2011-06-16 ENCOUNTER — Telehealth: Payer: Self-pay | Admitting: Internal Medicine

## 2011-06-16 NOTE — Telephone Encounter (Signed)
NA x 8 rings will try again later

## 2011-06-16 NOTE — Telephone Encounter (Signed)
Per pt call, pt needs to see Dr. Haroldine Laws ASAP. Pt c/o fluid in ankles. I told pt [facility] does not have any availability anytime soon. I offered to make pt appt with Richardson Dopp, pt declined stating he needs to see Dr. Haroldine Laws. Please return pt call to advise and/or discuss.

## 2011-06-16 NOTE — Telephone Encounter (Signed)
Pt states he has continued to have bilat LE edema from calves to ankles, he says [facility] does not seem to be to concerned so he would like to see Dr Haroldine Laws appt sch for Mon 7/30 at 1pm, he states he just had another biopsy that showed no rejection

## 2011-06-17 NOTE — Telephone Encounter (Signed)
Pt has an appointment with Nira Conn and Dr Haroldine Laws on Monday

## 2011-06-20 ENCOUNTER — Encounter: Payer: Self-pay | Admitting: *Deleted

## 2011-06-20 ENCOUNTER — Encounter: Payer: Self-pay | Admitting: Internal Medicine

## 2011-06-23 ENCOUNTER — Encounter: Payer: Self-pay | Admitting: Internal Medicine

## 2011-06-23 ENCOUNTER — Ambulatory Visit (INDEPENDENT_AMBULATORY_CARE_PROVIDER_SITE_OTHER): Payer: Medicaid Other | Admitting: Internal Medicine

## 2011-06-23 VITALS — BP 138/90 | HR 106 | Ht 64.0 in | Wt 199.0 lb

## 2011-06-23 DIAGNOSIS — E119 Type 2 diabetes mellitus without complications: Secondary | ICD-10-CM

## 2011-06-23 DIAGNOSIS — I5022 Chronic systolic (congestive) heart failure: Secondary | ICD-10-CM

## 2011-06-23 DIAGNOSIS — R609 Edema, unspecified: Secondary | ICD-10-CM

## 2011-06-23 LAB — BRAIN NATRIURETIC PEPTIDE: Pro B Natriuretic peptide (BNP): 141 pg/mL — ABNORMAL HIGH (ref 0.0–100.0)

## 2011-06-23 LAB — BASIC METABOLIC PANEL
BUN: 65 mg/dL — ABNORMAL HIGH (ref 6–23)
CO2: 26 mEq/L (ref 19–32)
Calcium: 9.5 mg/dL (ref 8.4–10.5)
Chloride: 105 mEq/L (ref 96–112)
Creatinine, Ser: 2.7 mg/dL — ABNORMAL HIGH (ref 0.4–1.5)
GFR: 33.15 mL/min — ABNORMAL LOW (ref >60.00)
Glucose, Bld: 178 mg/dL — ABNORMAL HIGH (ref 70–99)
Potassium: 4.3 mEq/L (ref 3.5–5.1)
Sodium: 141 mEq/L (ref 135–145)

## 2011-06-23 LAB — HEMOGLOBIN A1C: Hgb A1c MFr Bld: 9.2 % — ABNORMAL HIGH (ref 4.6–6.5)

## 2011-06-23 MED ORDER — CARVEDILOL 12.5 MG PO TABS: 18.7500 mg | ORAL_TABLET | Freq: Two times a day (BID) | ORAL | Status: DC

## 2011-06-23 NOTE — Assessment & Plan Note (Signed)
Suspect this is multifactorial due to venous insufficiency, RHF and possibly Revatio. Will increase lasix for the next 2 days and add compression hose. I placed a call to Para Skeans (transplant coordinator) to discuss.

## 2011-06-23 NOTE — Progress Notes (Signed)
HPI:  David Gregory is a 45 year old male with h/o CHF due to nonischemic cardiomyopathy  EF 15-25% s/p BiVICD.  He also has a history of HTN, DM, CRI, previous tobacco and alcohol use (now abstinent since 12/10). Underwent OHTx in March 2012.   Post-op course c/b by RH failure requiring an RVAD ann TV ring and renal insufficiency. Has had mild rejection in June by biopsy last week was OK.   Returns for f/u. Has been struggling with LE edema since transplant. In am legs look good but throughout the day legs start swelling. Has tried compression stockings without much help. Transplant clinic not wanting to increase lasix due to CRI.  Otherwise feels good. No dyspnea, orthopnea, PND. No palpitations. Weight up due to prednisone burst he got for rejection last month. Now taking 15mg /day.   Echo 06/12/11 LVEF >55% RV mildly dilated /HK no sig PAH  ROS: All systems negative except as listed in HPI, PMH and Problem List.  Past Medical History  Diagnosis Date  . VENTRICULAR TACHYCARDIA   . SYSTOLIC HEART FAILURE, CHRONIC   . SYSTOLIC HEART FAILURE, ACUTE ON CHRONIC   . CONGESTIVE HEART FAILURE UNSPECIFIED   . Gout, unspecified   . ERECTILE DYSFUNCTION, ORGANIC   . Edema   . Diarrhea   . CHEST PAIN UNSPECIFIED   . Carpal tunnel syndrome     Current Outpatient Prescriptions  Medication Sig Dispense Refill  . allopurinol (ZYLOPRIM) 100 MG tablet Take 200 mg by mouth daily.        . carvedilol (COREG) 12.5 MG tablet Take 12.5 mg by mouth 2 (two) times daily.        . digoxin (LANOXIN) 0.25 MG tablet Take 250 mcg by mouth daily.        . furosemide (LASIX) 40 MG tablet Take 1 tablet (40 mg total) by mouth 2 (two) times daily.  60 tablet  11  . glimepiride (AMARYL) 4 MG tablet Take 4 mg by mouth daily.        . Glucosamine Sulfate POWD 1 tablet daily.        . hydrALAZINE (APRESOLINE) 50 MG tablet Take 50 mg by mouth 3 (three) times daily.        . sodium chloride 0.9 % SOLN with milrinone 1 MG/ML SOLN  200 mcg/mL        . Tadalafil, PAH, (ADCIRCA) 20 MG TABS Take by mouth daily.           PHYSICAL EXAM: Filed Vitals:   06/23/11 1317  BP: 138/90  Pulse: 106   General:  Well appearing. No resp difficulty HEENT: normal Neck: supple. JVP flat. Carotids 2+ bilaterally; no bruits. No lymphadenopathy or thryomegaly appreciated. Cor: PMI normal. Regular rate & rhythm. No rubs, gallops or murmurs. Lungs: clear Abdomen: soft, nontender, nondistended. No hepatosplenomegaly. No bruits or masses. Good bowel sounds. Extremities: no cyanosis, clubbing, rash. 3+ ankle edema bilaterally Neuro: alert & orientedx3, cranial nerves grossly intact. Moves all 4 extremities w/o difficulty. Affect pleasant.    ECG: Sinus tach 103 RBBB  Increase coreg 18.75   ASSESSMENT & PLAN:

## 2011-06-23 NOTE — Patient Instructions (Signed)
Increase Lasix to 2 tabs in AM for 2 days Increase Carvedilol to 12.5 mg 1 & 1/2 tabs Twice daily   Labs today  We have given you a prescription for compression hose

## 2011-06-25 ENCOUNTER — Telehealth: Payer: Self-pay | Admitting: Internal Medicine

## 2011-06-25 DIAGNOSIS — N289 Disorder of kidney and ureter, unspecified: Secondary | ICD-10-CM

## 2011-06-25 NOTE — Telephone Encounter (Signed)
Spoke with pt, he voiced understanding to hold lasix for 3 days. He will return to the office on Monday for repeat labs. Labs faxed to dr anderson's office Fredia Beets

## 2011-06-25 NOTE — Telephone Encounter (Signed)
Per pt call, pt said David Gregory was suppose to fax some information to pt PCP. Pt said A1C and chemical panel need to be faxed to David Gregory at [facility].

## 2011-06-26 ENCOUNTER — Ambulatory Visit: Payer: Medicaid Other | Admitting: Internal Medicine

## 2011-06-26 NOTE — Assessment & Plan Note (Signed)
S/p transplant. Doing well from functional standpoint. Will continue to follow with [facility] transplant team.

## 2011-06-30 ENCOUNTER — Other Ambulatory Visit (INDEPENDENT_AMBULATORY_CARE_PROVIDER_SITE_OTHER): Payer: Medicaid Other | Admitting: *Deleted

## 2011-06-30 DIAGNOSIS — N289 Disorder of kidney and ureter, unspecified: Secondary | ICD-10-CM

## 2011-06-30 LAB — BASIC METABOLIC PANEL
BUN: 52 mg/dL — ABNORMAL HIGH (ref 6–23)
CO2: 25 mEq/L (ref 19–32)
Calcium: 9.2 mg/dL (ref 8.4–10.5)
Chloride: 98 mEq/L (ref 96–112)
Creatinine, Ser: 2 mg/dL — ABNORMAL HIGH (ref 0.4–1.5)
GFR: 46.2 mL/min — ABNORMAL LOW (ref >60.00)
Glucose, Bld: 177 mg/dL — ABNORMAL HIGH (ref 70–99)
Potassium: 4.2 mEq/L (ref 3.5–5.1)
Sodium: 138 mEq/L (ref 135–145)

## 2011-07-21 ENCOUNTER — Ambulatory Visit (HOSPITAL_COMMUNITY)
Admission: RE | Admit: 2011-07-21 | Discharge: 2011-07-21 | Disposition: A | Discharge status: Home / Self Care | Payer: Medicaid Other | Source: Ambulatory Visit | Attending: Internal Medicine | Admitting: Internal Medicine

## 2011-07-21 ENCOUNTER — Telehealth (HOSPITAL_COMMUNITY): Payer: Self-pay | Admitting: *Deleted

## 2011-07-21 DIAGNOSIS — R609 Edema, unspecified: Secondary | ICD-10-CM

## 2011-07-21 DIAGNOSIS — I5022 Chronic systolic (congestive) heart failure: Secondary | ICD-10-CM

## 2011-07-21 MED ORDER — SILDENAFIL CITRATE 20 MG PO TABS: 40.0000 mg | ORAL_TABLET | Freq: Three times a day (TID) | ORAL | Status: DC

## 2011-07-21 NOTE — Assessment & Plan Note (Addendum)
S/p transplant. Left side of his heart is doing well but he has been struggling with signifcant RHF. Now with marked volume overload in setting of tenuous renal function. I discussed with Dr. Lenell Antu ate the [facility]. Will increase Revatia to 40mg  TID and admit for IV diuresis with milrinone support as needed. He is reluctant for hospital admission at this time. He is agreeable to hospital admit 07-30-2011 . He is to add extra 40 mg Lasix every other day.  Total time spent 55 minutes  Patient seen and examined with Darrick Grinder, NP. We discussed all aspects of the encounter. I agree with the assessment and plan as stated above.

## 2011-07-21 NOTE — Progress Notes (Signed)
HPI:  David Gregory is a 45 year old male with h/o CHF due to nonischemic cardiomyopathy  EF 15-25% s/p BiVICD.  He also has a history of HTN, DM, CRI, previous tobacco and alcohol use (now abstinent since 12/10). Underwent OHTx in March 2012.   Post-op course c/b by RH failure with associated renal failure requiring an RVAD and TV ring annuloplasty. Has had mild rejection in June but all subsequent biopsies OK.   At last visit I felt he was fluid overloaded and we wrote to increase his lasix but Cr came back over 3 so we did not increase lasix.   Returns for f/u. Continues to struggle with volume overload. Weight is up 15 pounds since last visit. Abdomen distended.  Has tried compression stockings without much help. Transplant clinic reluctant to increase lasix due to CRI.  Feels ok but fatigue due to fluid. Dyspnea going up steps.  No  orthopnea, PND. No palpitations. Weight up due to prednisone burst he got for rejection last month. Now taking 12.5 mg/day. Weighs at home going up and last weight was 207. He felt better at 185. Feels full quickly due to increase abdominal girth.   RHC at [facility] in May 2012 RA 12 PA 53/30 (36) PCW 13 Echo at [facility] 8/16: LVEF normal RV moderate HK. TV ring with mean gradient 7. No TR  ROS: All systems negative except as listed in HPI, PMH and Problem List.  Past Medical History  Diagnosis Date  . VENTRICULAR TACHYCARDIA   . SYSTOLIC HEART FAILURE, CHRONIC   . SYSTOLIC HEART FAILURE, ACUTE ON CHRONIC   . CONGESTIVE HEART FAILURE UNSPECIFIED   . Gout, unspecified   . ERECTILE DYSFUNCTION, ORGANIC   . Edema   . Diarrhea   . CHEST PAIN UNSPECIFIED   . Carpal tunnel syndrome     Current Outpatient Prescriptions  Medication Sig Dispense Refill  . allopurinol (ZYLOPRIM) 100 MG tablet Take 200 mg by mouth daily.        . carvedilol (COREG) 12.5 MG tablet Take 1.5 tablets (18.75 mg total) by mouth 2 (two) times daily.  90 tablet  6  . digoxin (LANOXIN) 0.25 MG tablet  Take 250 mcg by mouth daily.        . furosemide (LASIX) 40 MG tablet Take 1 tablet (40 mg total) by mouth 2 (two) times daily.  60 tablet  11  . glimepiride (AMARYL) 4 MG tablet Take 4 mg by mouth daily.        . Glucosamine Sulfate POWD 1 tablet daily.        . hydrALAZINE (APRESOLINE) 50 MG tablet Take 50 mg by mouth 3 (three) times daily.        . sodium chloride 0.9 % SOLN with milrinone 1 MG/ML SOLN 200 mcg/mL        . Tadalafil, PAH, (ADCIRCA) 20 MG TABS Take by mouth daily.           PHYSICAL EXAM: Filed Vitals:   07/21/11 1332  BP: 140/82  Pulse: 103   General:  Well appearing. No resp difficulty HEENT: normal Neck: supple. JVP 12 Carotids 2+ bilaterally; no bruits. No lymphadenopathy or thryomegaly appreciated. Cor: PMI normal. Tachy. Regular rate & rhythm.   Lungs: clear Abdomen: distended. Non-tender. No bruits or masses. Good bowel sounds. Extremities: no cyanosis, clubbing, rash. 3+ ankle edema bilaterally Neuro: alert & orientedx3, cranial nerves grossly intact. Moves all 4 extremities w/o difficulty. Affect pleasant    ASSESSMENT & PLAN:

## 2011-07-21 NOTE — Assessment & Plan Note (Addendum)
Suspect this is multifactorial due to venous insufficiency, RHF and possibly Revatio. Continue compression hose. Dr Haroldine Laws  placed a call to Dr Stann Mainland to discuss.  See HF note.

## 2011-07-21 NOTE — Patient Instructions (Signed)
Increase Revatio to 40 mg Three times a day   Take an extra 40 mg of Lasix every other day  We will admit you to the hospital on Tue 07/29/11

## 2011-07-23 NOTE — Telephone Encounter (Signed)
Opened in error

## 2011-07-25 ENCOUNTER — Telehealth (HOSPITAL_COMMUNITY): Payer: Self-pay | Admitting: *Deleted

## 2011-07-25 NOTE — Telephone Encounter (Signed)
Pt came by and brought his med list, changes made to update list

## 2011-07-29 ENCOUNTER — Inpatient Hospital Stay (HOSPITAL_COMMUNITY)
Admission: RE | Admit: 2011-07-29 | Discharge: 2011-08-02 | DRG: 292 | Disposition: A | Discharge status: Home / Self Care | Payer: Medicaid Other | Source: Ambulatory Visit | Attending: Internal Medicine | Admitting: Internal Medicine

## 2011-07-29 ENCOUNTER — Inpatient Hospital Stay (HOSPITAL_COMMUNITY): Payer: Medicaid Other

## 2011-07-29 DIAGNOSIS — M109 Gout, unspecified: Secondary | ICD-10-CM | POA: Diagnosis present

## 2011-07-29 DIAGNOSIS — I5023 Acute on chronic systolic (congestive) heart failure: Secondary | ICD-10-CM

## 2011-07-29 DIAGNOSIS — Z79899 Other long term (current) drug therapy: Secondary | ICD-10-CM

## 2011-07-29 DIAGNOSIS — E119 Type 2 diabetes mellitus without complications: Secondary | ICD-10-CM | POA: Diagnosis present

## 2011-07-29 DIAGNOSIS — Z6836 Body mass index (BMI) 36.0-36.9, adult: Secondary | ICD-10-CM

## 2011-07-29 DIAGNOSIS — Z7982 Long term (current) use of aspirin: Secondary | ICD-10-CM

## 2011-07-29 DIAGNOSIS — E785 Hyperlipidemia, unspecified: Secondary | ICD-10-CM | POA: Diagnosis present

## 2011-07-29 DIAGNOSIS — I428 Other cardiomyopathies: Secondary | ICD-10-CM | POA: Diagnosis present

## 2011-07-29 DIAGNOSIS — E039 Hypothyroidism, unspecified: Secondary | ICD-10-CM | POA: Diagnosis present

## 2011-07-29 DIAGNOSIS — N189 Chronic kidney disease, unspecified: Secondary | ICD-10-CM | POA: Diagnosis present

## 2011-07-29 DIAGNOSIS — E669 Obesity, unspecified: Secondary | ICD-10-CM | POA: Diagnosis present

## 2011-07-29 DIAGNOSIS — N179 Acute kidney failure, unspecified: Secondary | ICD-10-CM | POA: Diagnosis present

## 2011-07-29 DIAGNOSIS — Z941 Heart transplant status: Secondary | ICD-10-CM

## 2011-07-29 DIAGNOSIS — K219 Gastro-esophageal reflux disease without esophagitis: Secondary | ICD-10-CM | POA: Diagnosis present

## 2011-07-29 DIAGNOSIS — Z794 Long term (current) use of insulin: Secondary | ICD-10-CM

## 2011-07-29 DIAGNOSIS — I509 Heart failure, unspecified: Secondary | ICD-10-CM | POA: Diagnosis present

## 2011-07-29 DIAGNOSIS — I2789 Other specified pulmonary heart diseases: Secondary | ICD-10-CM | POA: Diagnosis present

## 2011-07-29 DIAGNOSIS — I129 Hypertensive chronic kidney disease with stage 1 through stage 4 chronic kidney disease, or unspecified chronic kidney disease: Secondary | ICD-10-CM | POA: Diagnosis present

## 2011-07-29 DIAGNOSIS — IMO0002 Reserved for concepts with insufficient information to code with codable children: Secondary | ICD-10-CM

## 2011-07-29 LAB — DIFFERENTIAL
Basophils Absolute: 0 10*3/uL (ref 0.0–0.1)
Basophils Relative: 0 % (ref 0–1)
Eosinophils Absolute: 0.1 10*3/uL (ref 0.0–0.7)
Eosinophils Relative: 1 % (ref 0–5)
Lymphocytes Relative: 5 % — ABNORMAL LOW (ref 12–46)
Lymphs Abs: 0.3 10*3/uL — ABNORMAL LOW (ref 0.7–4.0)
Monocytes Absolute: 0.4 10*3/uL (ref 0.1–1.0)
Monocytes Relative: 7 % (ref 3–12)
Neutro Abs: 4.7 10*3/uL (ref 1.7–7.7)
Neutrophils Relative %: 87 % — ABNORMAL HIGH (ref 43–77)
WBC Morphology: INCREASED

## 2011-07-29 LAB — COMPREHENSIVE METABOLIC PANEL
ALT: 24 U/L (ref 0–53)
AST: 25 U/L (ref 0–37)
Albumin: 4.1 g/dL (ref 3.5–5.2)
Alkaline Phosphatase: 152 U/L — ABNORMAL HIGH (ref 39–117)
BUN: 68 mg/dL — ABNORMAL HIGH (ref 6–23)
CO2: 29 mEq/L (ref 19–32)
Calcium: 9.8 mg/dL (ref 8.4–10.5)
Chloride: 97 mEq/L (ref 96–112)
Creatinine, Ser: 2.11 mg/dL — ABNORMAL HIGH (ref 0.50–1.35)
GFR calc Af Amer: 41 mL/min — ABNORMAL LOW (ref >60)
GFR calc non Af Amer: 34 mL/min — ABNORMAL LOW (ref >60)
Glucose, Bld: 333 mg/dL — ABNORMAL HIGH (ref 70–99)
Potassium: 4.9 mEq/L (ref 3.5–5.1)
Sodium: 137 mEq/L (ref 135–145)
Total Bilirubin: 0.5 mg/dL (ref 0.3–1.2)
Total Protein: 7 g/dL (ref 6.0–8.3)

## 2011-07-29 LAB — GLUCOSE, CAPILLARY
Glucose-Capillary: 250 mg/dL — ABNORMAL HIGH (ref 70–99)
Glucose-Capillary: 318 mg/dL — ABNORMAL HIGH (ref 70–99)

## 2011-07-29 LAB — CBC
HCT: 35.7 % — ABNORMAL LOW (ref 39.0–52.0)
Hemoglobin: 10.8 g/dL — ABNORMAL LOW (ref 13.0–17.0)
MCH: 25.2 pg — ABNORMAL LOW (ref 26.0–34.0)
MCHC: 30.3 g/dL (ref 30.0–36.0)
MCV: 83.4 fL (ref 78.0–100.0)
Platelets: 208 10*3/uL (ref 150–400)
RBC: 4.28 MIL/uL (ref 4.22–5.81)
RDW: 19 % — ABNORMAL HIGH (ref 11.5–15.5)
WBC: 5.5 10*3/uL (ref 4.0–10.5)

## 2011-07-29 LAB — PRO B NATRIURETIC PEPTIDE: Pro B Natriuretic peptide (BNP): 1316 pg/mL — ABNORMAL HIGH (ref 0–125)

## 2011-07-29 LAB — MAGNESIUM: Magnesium: 1.7 mg/dL (ref 1.5–2.5)

## 2011-07-29 LAB — MRSA PCR SCREENING: MRSA by PCR: NEGATIVE

## 2011-07-30 ENCOUNTER — Inpatient Hospital Stay (HOSPITAL_COMMUNITY): Payer: Medicaid Other

## 2011-07-30 DIAGNOSIS — E8779 Other fluid overload: Secondary | ICD-10-CM

## 2011-07-30 DIAGNOSIS — R0902 Hypoxemia: Secondary | ICD-10-CM

## 2011-07-30 DIAGNOSIS — T8620 Unspecified complication of heart transplant: Secondary | ICD-10-CM

## 2011-07-30 DIAGNOSIS — Y830 Surgical operation with transplant of whole organ as the cause of abnormal reaction of the patient, or of later complication, without mention of misadventure at the time of the procedure: Secondary | ICD-10-CM

## 2011-07-30 LAB — CBC
HCT: 36.7 % — ABNORMAL LOW (ref 39.0–52.0)
Hemoglobin: 10.9 g/dL — ABNORMAL LOW (ref 13.0–17.0)
MCH: 25.1 pg — ABNORMAL LOW (ref 26.0–34.0)
MCHC: 29.7 g/dL — ABNORMAL LOW (ref 30.0–36.0)
MCV: 84.4 fL (ref 78.0–100.0)
Platelets: 199 10*3/uL (ref 150–400)
RBC: 4.35 MIL/uL (ref 4.22–5.81)
RDW: 19.1 % — ABNORMAL HIGH (ref 11.5–15.5)
WBC: 5.1 10*3/uL (ref 4.0–10.5)

## 2011-07-30 LAB — BASIC METABOLIC PANEL
BUN: 66 mg/dL — ABNORMAL HIGH (ref 6–23)
BUN: 65 mg/dL — ABNORMAL HIGH (ref 6–23)
CO2: 29 mEq/L (ref 19–32)
CO2: 27 mEq/L (ref 19–32)
Calcium: 9.6 mg/dL (ref 8.4–10.5)
Calcium: 10.1 mg/dL (ref 8.4–10.5)
Chloride: 102 mEq/L (ref 96–112)
Chloride: 100 mEq/L (ref 96–112)
Creatinine, Ser: 1.89 mg/dL — ABNORMAL HIGH (ref 0.50–1.35)
Creatinine, Ser: 2.02 mg/dL — ABNORMAL HIGH (ref 0.50–1.35)
GFR calc Af Amer: 47 mL/min — ABNORMAL LOW (ref >60)
GFR calc Af Amer: 43 mL/min — ABNORMAL LOW (ref >60)
GFR calc non Af Amer: 39 mL/min — ABNORMAL LOW (ref >60)
GFR calc non Af Amer: 36 mL/min — ABNORMAL LOW (ref >60)
Glucose, Bld: 152 mg/dL — ABNORMAL HIGH (ref 70–99)
Glucose, Bld: 212 mg/dL — ABNORMAL HIGH (ref 70–99)
Potassium: 4.2 mEq/L (ref 3.5–5.1)
Potassium: 4.7 mEq/L (ref 3.5–5.1)
Sodium: 141 mEq/L (ref 135–145)
Sodium: 140 mEq/L (ref 135–145)

## 2011-07-30 LAB — GLUCOSE, CAPILLARY
Glucose-Capillary: 200 mg/dL — ABNORMAL HIGH (ref 70–99)
Glucose-Capillary: 164 mg/dL — ABNORMAL HIGH (ref 70–99)
Glucose-Capillary: 262 mg/dL — ABNORMAL HIGH (ref 70–99)
Glucose-Capillary: 430 mg/dL — ABNORMAL HIGH (ref 70–99)

## 2011-07-30 LAB — TSH: TSH: 2.693 u[IU]/mL (ref 0.350–4.500)

## 2011-07-30 LAB — HEPARIN LEVEL (UNFRACTIONATED): Heparin Unfractionated: 0.64 IU/mL (ref 0.30–0.70)

## 2011-07-31 LAB — CBC
HCT: 34.9 % — ABNORMAL LOW (ref 39.0–52.0)
HCT: 35.6 % — ABNORMAL LOW (ref 39.0–52.0)
Hemoglobin: 10.9 g/dL — ABNORMAL LOW (ref 13.0–17.0)
Hemoglobin: 10.9 g/dL — ABNORMAL LOW (ref 13.0–17.0)
MCH: 26.3 pg (ref 26.0–34.0)
MCH: 25.8 pg — ABNORMAL LOW (ref 26.0–34.0)
MCHC: 31.2 g/dL (ref 30.0–36.0)
MCHC: 30.6 g/dL (ref 30.0–36.0)
MCV: 84.3 fL (ref 78.0–100.0)
MCV: 84.2 fL (ref 78.0–100.0)
Platelets: 178 10*3/uL (ref 150–400)
Platelets: 209 10*3/uL (ref 150–400)
RBC: 4.14 MIL/uL — ABNORMAL LOW (ref 4.22–5.81)
RBC: 4.23 MIL/uL (ref 4.22–5.81)
RDW: 19 % — ABNORMAL HIGH (ref 11.5–15.5)
RDW: 18.9 % — ABNORMAL HIGH (ref 11.5–15.5)
WBC: 4.6 10*3/uL (ref 4.0–10.5)
WBC: 4.9 10*3/uL (ref 4.0–10.5)

## 2011-07-31 LAB — BASIC METABOLIC PANEL
BUN: 68 mg/dL — ABNORMAL HIGH (ref 6–23)
BUN: 74 mg/dL — ABNORMAL HIGH (ref 6–23)
CO2: 26 mEq/L (ref 19–32)
CO2: 27 mEq/L (ref 19–32)
Calcium: 9.8 mg/dL (ref 8.4–10.5)
Calcium: 10.2 mg/dL (ref 8.4–10.5)
Chloride: 100 mEq/L (ref 96–112)
Chloride: 99 mEq/L (ref 96–112)
Creatinine, Ser: 2.02 mg/dL — ABNORMAL HIGH (ref 0.50–1.35)
Creatinine, Ser: 2.31 mg/dL — ABNORMAL HIGH (ref 0.50–1.35)
GFR calc Af Amer: 43 mL/min — ABNORMAL LOW (ref >60)
GFR calc Af Amer: 37 mL/min — ABNORMAL LOW (ref >60)
GFR calc non Af Amer: 36 mL/min — ABNORMAL LOW (ref >60)
GFR calc non Af Amer: 31 mL/min — ABNORMAL LOW (ref >60)
Glucose, Bld: 165 mg/dL — ABNORMAL HIGH (ref 70–99)
Glucose, Bld: 272 mg/dL — ABNORMAL HIGH (ref 70–99)
Potassium: 4.4 mEq/L (ref 3.5–5.1)
Potassium: 5.4 mEq/L — ABNORMAL HIGH (ref 3.5–5.1)
Sodium: 138 mEq/L (ref 135–145)
Sodium: 136 mEq/L (ref 135–145)

## 2011-07-31 LAB — GLUCOSE, CAPILLARY
Glucose-Capillary: 166 mg/dL — ABNORMAL HIGH (ref 70–99)
Glucose-Capillary: 201 mg/dL — ABNORMAL HIGH (ref 70–99)
Glucose-Capillary: 254 mg/dL — ABNORMAL HIGH (ref 70–99)
Glucose-Capillary: 163 mg/dL — ABNORMAL HIGH (ref 70–99)

## 2011-07-31 LAB — HEPARIN LEVEL (UNFRACTIONATED)
Heparin Unfractionated: 0.39 IU/mL (ref 0.30–0.70)
Heparin Unfractionated: 0.43 IU/mL (ref 0.30–0.70)
Heparin Unfractionated: 0.67 IU/mL (ref 0.30–0.70)

## 2011-08-01 LAB — BASIC METABOLIC PANEL
BUN: 81 mg/dL — ABNORMAL HIGH (ref 6–23)
BUN: 88 mg/dL — ABNORMAL HIGH (ref 6–23)
CO2: 24 mEq/L (ref 19–32)
CO2: 22 mEq/L (ref 19–32)
Calcium: 10.5 mg/dL (ref 8.4–10.5)
Calcium: 10.7 mg/dL — ABNORMAL HIGH (ref 8.4–10.5)
Chloride: 100 mEq/L (ref 96–112)
Chloride: 101 mEq/L (ref 96–112)
Creatinine, Ser: 2.7 mg/dL — ABNORMAL HIGH (ref 0.50–1.35)
Creatinine, Ser: 3.47 mg/dL — ABNORMAL HIGH (ref 0.50–1.35)
GFR calc Af Amer: 31 mL/min — ABNORMAL LOW (ref >60)
GFR calc Af Amer: 23 mL/min — ABNORMAL LOW (ref >60)
GFR calc non Af Amer: 26 mL/min — ABNORMAL LOW (ref >60)
GFR calc non Af Amer: 19 mL/min — ABNORMAL LOW (ref >60)
Glucose, Bld: 143 mg/dL — ABNORMAL HIGH (ref 70–99)
Glucose, Bld: 210 mg/dL — ABNORMAL HIGH (ref 70–99)
Potassium: 4.7 mEq/L (ref 3.5–5.1)
Potassium: 5.5 mEq/L — ABNORMAL HIGH (ref 3.5–5.1)
Sodium: 136 mEq/L (ref 135–145)
Sodium: 136 mEq/L (ref 135–145)

## 2011-08-01 LAB — CBC
HCT: 36.9 % — ABNORMAL LOW (ref 39.0–52.0)
Hemoglobin: 11.3 g/dL — ABNORMAL LOW (ref 13.0–17.0)
MCH: 25.7 pg — ABNORMAL LOW (ref 26.0–34.0)
MCHC: 30.6 g/dL (ref 30.0–36.0)
MCV: 83.9 fL (ref 78.0–100.0)
Platelets: 190 10*3/uL (ref 150–400)
RBC: 4.4 MIL/uL (ref 4.22–5.81)
RDW: 18.9 % — ABNORMAL HIGH (ref 11.5–15.5)
WBC: 4.8 10*3/uL (ref 4.0–10.5)

## 2011-08-01 LAB — GLUCOSE, CAPILLARY
Glucose-Capillary: 127 mg/dL — ABNORMAL HIGH (ref 70–99)
Glucose-Capillary: 220 mg/dL — ABNORMAL HIGH (ref 70–99)
Glucose-Capillary: 207 mg/dL — ABNORMAL HIGH (ref 70–99)
Glucose-Capillary: 270 mg/dL — ABNORMAL HIGH (ref 70–99)

## 2011-08-01 LAB — HEPARIN LEVEL (UNFRACTIONATED): Heparin Unfractionated: 0.65 IU/mL (ref 0.30–0.70)

## 2011-08-02 LAB — GLUCOSE, CAPILLARY: Glucose-Capillary: 142 mg/dL — ABNORMAL HIGH (ref 70–99)

## 2011-08-02 LAB — BASIC METABOLIC PANEL
BUN: 92 mg/dL — ABNORMAL HIGH (ref 6–23)
CO2: 23 mEq/L (ref 19–32)
Calcium: 10.2 mg/dL (ref 8.4–10.5)
Chloride: 101 mEq/L (ref 96–112)
Creatinine, Ser: 3.25 mg/dL — ABNORMAL HIGH (ref 0.50–1.35)
GFR calc Af Amer: 25 mL/min — ABNORMAL LOW (ref >60)
GFR calc non Af Amer: 21 mL/min — ABNORMAL LOW (ref >60)
Glucose, Bld: 123 mg/dL — ABNORMAL HIGH (ref 70–99)
Potassium: 4.4 mEq/L (ref 3.5–5.1)
Sodium: 138 mEq/L (ref 135–145)

## 2011-08-05 NOTE — Discharge Summary (Signed)
[name], [name] NO.:  [id number]  MEDICAL RECORD NO.:  [id number]  LOCATION:  2927                         FACILITY:  [facility]  PHYSICIAN:  Satira Sark, MD DATE OF BIRTH:  [birth year]  DATE OF ADMISSION:  07/29/2011 DATE OF DISCHARGE:  08/02/2011                              DISCHARGE SUMMARY   CARDIOLOGIST:  Dr. Glori Bickers.  ADMISSION HISTORY:  David Gregory is a 45 year old male with a history of CHF due to nonischemic cardiomyopathy, EF 15-25%, status post prior BiV ICD, hypertension, diabetes, chronic kidney disease, as well as previous tobacco and alcohol abuse.  He underwent heart transplant in March 2012. His post op course has been complicated by right heart failure with associated renal failure, requiring RVAD and tricuspid valve ring annuloplasty.    He was seen in the Heart Failure Clinic on July 21, 2011, by Dr. Haroldine Laws.  The patient had marked volume overload.  The case was discussed with Dr. Stann Mainland at the [facility] and it was suggested that his Revatio be increased to 40 mg 3 times a day and to be admitted for IV diuresis.  The patient refused this.  He was seen again in Heart Failure Clinic on September 4.  The patient was now noted to have a 21-pound weight gain and he agreed to admission.  HOSPITAL COURSE:  The patient was admitted to [facility] for IV diuresis  with Lasix 80 mg IV b.i.d.  He had minimal response to IV diuretics and agreed to undergo ultrafiltration.  Ultrafiltration was started by Dr. Haroldine Laws.  He showed improvement with this with steady decrease in his weight from 100.5 kg on admission to 93.4 kg on August 01, 2011.  The patient was evaluated by Dr. Aundra Dubin on August 01, 2011, who stopped his ultrafiltration.  He recommended switching him to torsemide 60 mg daily.  He also recommended increasing his Revatio to 60 mg 3 times a day.  The patient did develop worsening renal function.   His baseline creatinine on admission was 2.11.  This increased to 3.47 on August 01, 2011.    The patient was evaluated by Dr. Domenic Polite on September 8,2012.   His creatinine was somewhat improved to 3.25, but it was not ideal. It was felt that the patient should stay in the hospital for further titration of his medications and to further monitor his renal function. However, the patient insisted on going home.  He does have a followup early next week in the Heart Failure Clinic.  Therefore, the patient will be discharged to home on Demadex 60 mg a day and require a close followup next week with a basic metabolic panel.  In reviewing the patient's medications, his allopurinol was adjusted down to 100 mg a day given his worsening renal function.  As noted above, his Revatio was increased to 20 mg 3 tablets every 8 hours.  As noted above, he was placed on Demadex 20 mg 3 tablets daily as well as potassium 20 mEq 2 tablets daily.  His digoxin was held upon admission and this continued to be held at discharge given his worsening renal function. Hydralazine was also held during this  admission.  His blood pressure was stable at discharge; and therefore, his hydralazine was also held.  Of note, the diabetic coordinator did help adjust his diabetic regimen while in the hospital and his home dose of insulin was restarted.  The patient's pre-admission medication list was somewhat confusing.  He  mentioned that he was not taking several medications listed.  The discharge list below is as complete a list we have at the time of discharge.  DISCHARGE DIAGNOSES: 1. Acute-on-chronic systolic heart failure - improved at discharge. 2. Nonischemic cardiomyopathy, status post open heart transplant. 3. Pulmonary hypertension with right heart failure. 4. Acute-on-chronic renal failure with a discharge creatinine of 3.2. 5. Hypertension. 6. Diabetes mellitus. 7. Gout 8. Hypothyroidism 9.  Dyslipidemia 10.GERD  DISCHARGE MEDICATIONS: 1. Allopurinol 100 mg daily. 2. Calcium carbonate 500 mg. 3. Vitamin D 200 International Units twice daily. 4. Levothyroxine 25 mcg daily. 5. Methotrexate 2.5 mg twice a week. 6. Multivitamin daily. 7. Potassium chloride 20 mEq 2 tablets daily, which is new. 8. Pravastatin 20 mg nightly. 9. Revatio 20 mg 3 tablets every 8 hours. 10.Prograf 8 mg daily. 11.Prograf 7 mg nightly. 12.Demadex 20 mg 3 tablets daily, which is new. 13.Valtrex 500 mg 2 tablets daily. 14.Aspirin 81 mg daily. 15.Coreg 12.5 mg 1 and 1/2 tablets twice daily. 16.Glimepiride 4 mg daily with breakfast. 17.Glucosamine daily. 18.CellCept 500 mg 3 capsules by mouth twice daily. 19.Novolin 70/30 of 64 units in the morning and 26 units in the     evening. 20.Pantoprazole 40 mg daily. 21.Prednisone 5 mg 2-1/2 tablets daily.  The patient has been advised to stop taking his Lanoxin and hydralazine. It is also noted that his allopurinol has been decreased and his Revatio has been increased.  LABORATORY AND ANCILLARY DATA:  Hemoglobin 11.3, potassium 4.4 at discharge, creatinine 3.25 at discharge.  TSH 2.693.  Chest x-ray on July 30, 2011, right subclavian central line placement without pneumothorax.  ACTIVITY:  Increase activity slowly.  DIET:  Low-fat, low-sodium, diabetic diet.  FOLLOWUP:  With Dr. Clayborne [facility] on August 06, 2011, at 11:15 a.m.  He will need a basic metabolic panel drawn that day.  Total physician PA time 60 minutes on this discharge.     David Dopp, PA-C   ______________________________ Satira Sark, MD    SW/MEDQ  D:  08/02/2011  T:  08/02/2011  Job:  [id number]  cc:   Shaune Pascal. Bensimhon, MD  Electronically Signed by David Dopp PA-C on 08/03/2011 [id number] PM Electronically Signed by Rozann Lesches MD on 08/05/2011 01:00:45 PM

## 2011-08-06 ENCOUNTER — Ambulatory Visit (HOSPITAL_COMMUNITY)
Admission: RE | Admit: 2011-08-06 | Discharge: 2011-08-06 | Disposition: A | Discharge status: Home / Self Care | Payer: Medicaid Other | Source: Ambulatory Visit | Attending: Internal Medicine | Admitting: Internal Medicine

## 2011-08-06 VITALS — BP 131/82 | HR 100 | Wt 209.0 lb

## 2011-08-06 DIAGNOSIS — R0989 Other specified symptoms and signs involving the circulatory and respiratory systems: Secondary | ICD-10-CM

## 2011-08-06 DIAGNOSIS — I5022 Chronic systolic (congestive) heart failure: Secondary | ICD-10-CM | POA: Insufficient documentation

## 2011-08-06 DIAGNOSIS — I2721 Secondary pulmonary arterial hypertension: Secondary | ICD-10-CM | POA: Insufficient documentation

## 2011-08-06 DIAGNOSIS — I2789 Other specified pulmonary heart diseases: Secondary | ICD-10-CM

## 2011-08-06 MED ORDER — TORSEMIDE 20 MG PO TABS: 20.0000 mg | ORAL_TABLET | Freq: Two times a day (BID) | ORAL | Status: DC

## 2011-08-06 NOTE — Progress Notes (Signed)
HPI:  David Gregory is a 45 year old male with h/o CHF due to nonischemic cardiomyopathy  EF 15-25% s/p BiVICD.  He also has a history of HTN, DM, CRI, previous tobacco and alcohol use (now abstinent since 12/10). Underwent OHTx in March 2012.   Post-op course c/b by RH failure with associated renal failure requiring an RVAD and TV ring annuloplasty. Has had mild rejection in June but all subsequent biopsies OK.   RHC at [facility] in May 2012 RA 12 PA 53/30 (36) PCW 13 Echo at [facility] 8/16: LVEF normal RV moderate HK. TV ring with mean gradient 7. No TR  He has been experiencing fluid overload that was complicated by Cr>3 but with ~15 pound gain between visit it was best option would be hospitalization for diuresis.  He was admitted on 9/4 for ultrafiltration.  He diuresed ~7 kg but with elevated Cr of 3.25 ultrafiltration was stopped and he was transitioned to oral demadex.  During hospitalization his digoxin was discontinued secondary to his renal function and his hydralazine was discontinued secondary to soft BPs.  Revatio was also increased to 60 mg TID.    He returns today for follow up.  He has been maintaining his weight 205-206 lbs.  He denies dyspnea, orthopnea or PND.  He has been watching is salt and fluid intake.  He has been wearing his compression hose about every other day.  He still complains of increased abdominal girth but lower extremity edema greatly improved.     ROS: All systems negative except as listed in HPI, PMH and Problem List.  Past Medical History  Diagnosis Date  . VENTRICULAR TACHYCARDIA   . SYSTOLIC HEART FAILURE, CHRONIC   . SYSTOLIC HEART FAILURE, ACUTE ON CHRONIC   . CONGESTIVE HEART FAILURE UNSPECIFIED   . Gout, unspecified   . ERECTILE DYSFUNCTION, ORGANIC   . Edema   . Diarrhea   . CHEST PAIN UNSPECIFIED   . Carpal tunnel syndrome     Current Outpatient Prescriptions  Medication Sig Dispense Refill  . allopurinol (ZYLOPRIM) 100 MG tablet Take 100 mg by mouth  daily.        Marland Kitchen aspirin 81 MG tablet Take 81 mg by mouth daily.        . Calcium Carbonate-Vitamin D (CALCIUM-VITAMIN D) 600-200 MG-UNIT CAPS Take 1 capsule by mouth 2 (two) times daily.        . carvedilol (COREG) 12.5 MG tablet Take 12.5 mg by mouth 2 (two) times daily.        . insulin NPH-insulin regular (NOVOLIN 70/30) (70-30) 100 UNIT/ML injection Inject 56 Units into the skin daily with breakfast. 22 units in PM       . levothyroxine (SYNTHROID, LEVOTHROID) 25 MCG tablet Take 25 mcg by mouth daily.        . methotrexate (RHEUMATREX) 5 MG tablet Take 5 mg by mouth 2 (two) times a week. Caution: Chemotherapy. Protect from light.       . Multiple Vitamin (MULTIVITAMIN) tablet Take 1 tablet by mouth daily.        . mycophenolate (CELLCEPT) 500 MG tablet Take 1,500 mg by mouth 2 (two) times daily.        Marland Kitchen oxyCODONE (OXY IR/ROXICODONE) 5 MG immediate release tablet Take 5 mg by mouth every 4 (four) hours as needed.        . pantoprazole (PROTONIX) 40 MG tablet Take 40 mg by mouth daily.        . pravastatin (PRAVACHOL) 20 MG  tablet Take 20 mg by mouth daily.        . predniSONE (DELTASONE) 5 MG tablet Take 12.5 mg by mouth daily.        . sildenafil (REVATIO) 20 MG tablet Take 60 mg by mouth 3 (three) times daily.        . tacrolimus (PROGRAF) 1 MG capsule Take 8 mg in AM and 7 mg in PM       . torsemide (DEMADEX) 20 MG tablet Take 20 mg by mouth. Take 2 tabs in AM and 1 tab in PM      . valACYclovir (VALTREX) 1000 MG tablet Take 1,000 mg by mouth daily.           PHYSICAL EXAM: Filed Vitals:   08/06/11 1222  BP: 131/82  Pulse: 100  Wt 209  General:  Well appearing. No resp difficulty HEENT: normal Neck: supple. JVP 6-7. Carotids 2+ bilaterally; no bruits. No lymphadenopathy or thryomegaly appreciated. Cor: PMI normal. Regular rate & rhythm.   Lungs: clear Abdomen: distended. Non-tender. No bruits or masses. Good bowel sounds. Extremities: no cyanosis, clubbing, rash. 1+ ankle  edema bilaterally Neuro: alert & orientedx3, cranial nerves grossly intact. Moves all 4 extremities w/o difficulty. Affect pleasant    ASSESSMENT & PLAN:

## 2011-08-06 NOTE — Assessment & Plan Note (Addendum)
Volume status much improved post ultrafiltration.  NYHA II.  Agree with change of diuretic to demadex but worry his renal function may be worse on 60 mg daily.  Have asked him to hold demadex today and tomorrow then restart demadex 20 mg BID.  A BMET will be checked tomorrow at [facility] where he will undergo bx. Will not restart digoxin with elevated Cr.  He has been encouraged to wear compression stockings daily although he is unsure if he can do this but he will try.      Patient seen and examined with Leone Haven PA-C. We discussed all aspects of the encounter. I agree with the assessment and plan as stated above.

## 2011-08-06 NOTE — Assessment & Plan Note (Signed)
Revatio increased in the hospital to 60 mg TID.  He is tolerating increased dose well therefore no further changes at this time.

## 2011-08-06 NOTE — Patient Instructions (Signed)
Hold Torsemide tonight and tomorrow and then restart on Friday Torsemide 20 mg Twice daily   Your physician recommends that you schedule a follow-up appointment in: 2-3 weeks

## 2011-08-18 LAB — CBC
HCT: 23.9 — ABNORMAL LOW
HCT: 25 — ABNORMAL LOW
HCT: 26.5 — ABNORMAL LOW
HCT: 28.5 — ABNORMAL LOW
HCT: 31.1 — ABNORMAL LOW
Hemoglobin: 10.6 — ABNORMAL LOW
Hemoglobin: 8.2 — ABNORMAL LOW
Hemoglobin: 8.6 — ABNORMAL LOW
Hemoglobin: 9 — ABNORMAL LOW
Hemoglobin: 9.7 — ABNORMAL LOW
MCHC: 33.8
MCHC: 34.1
MCHC: 34.2
MCHC: 34.2
MCHC: 34.4
MCV: 87.9
MCV: 89
MCV: 89.3
MCV: 89.4
MCV: 89.8
Platelets: 168
Platelets: 176
Platelets: 180
Platelets: 191
Platelets: 206
RBC: 2.68 — ABNORMAL LOW
RBC: 2.79 — ABNORMAL LOW
RBC: 2.96 — ABNORMAL LOW
RBC: 3.2 — ABNORMAL LOW
RBC: 3.53 — ABNORMAL LOW
RDW: 13.9
RDW: 14.2
RDW: 14.2
RDW: 14.2
RDW: 14.4
WBC: 5.4
WBC: 5.7
WBC: 6.2
WBC: 6.2
WBC: 7.2

## 2011-08-18 LAB — COMPREHENSIVE METABOLIC PANEL
ALT: 18
AST: 26
Albumin: 2.7 — ABNORMAL LOW
Alkaline Phosphatase: 75
BUN: 19
CO2: 25
Calcium: 8.2 — ABNORMAL LOW
Chloride: 103
Creatinine, Ser: 1.13
GFR calc Af Amer: >60
GFR calc non Af Amer: >60
Glucose, Bld: 143 — ABNORMAL HIGH
Potassium: 4
Sodium: 134 — ABNORMAL LOW
Total Bilirubin: 0.5
Total Protein: 5.3 — ABNORMAL LOW

## 2011-08-18 LAB — HEMOGLOBIN A1C
Hgb A1c MFr Bld: 7.8 — ABNORMAL HIGH
Mean Plasma Glucose: 200

## 2011-08-18 LAB — DIFFERENTIAL
Basophils Absolute: 0
Basophils Relative: 0
Eosinophils Absolute: 0.2
Eosinophils Relative: 2
Lymphocytes Relative: 14
Lymphs Abs: 1
Monocytes Absolute: 0.8
Monocytes Relative: 11
Neutro Abs: 5.2
Neutrophils Relative %: 72

## 2011-08-18 LAB — POCT I-STAT, CHEM 8
BUN: 22
Calcium, Ion: 1.17
Chloride: 104
Creatinine, Ser: 1.2
Glucose, Bld: 187 — ABNORMAL HIGH
HCT: 26 — ABNORMAL LOW
Hemoglobin: 8.8 — ABNORMAL LOW
Potassium: 4.4
Sodium: 138
TCO2: 25

## 2011-08-18 LAB — IRON AND TIBC
Iron: 22 — ABNORMAL LOW
Saturation Ratios: 7 — ABNORMAL LOW
TIBC: 328
UIBC: 306

## 2011-08-18 LAB — PROTIME-INR
INR: 1
Prothrombin Time: 13.6

## 2011-08-18 LAB — FERRITIN: Ferritin: 44 (ref 22–322)

## 2011-08-18 LAB — RETICULOCYTES
RBC.: 2.91 — ABNORMAL LOW
Retic Count, Absolute: 107.7
Retic Ct Pct: 3.7 — ABNORMAL HIGH

## 2011-08-18 LAB — FOLATE: Folate: 14.5

## 2011-08-18 LAB — TYPE AND SCREEN
ABO/RH(D): B POS
Antibody Screen: NEGATIVE

## 2011-08-18 LAB — BASIC METABOLIC PANEL
BUN: 7
CO2: 26
Calcium: 8.4
Chloride: 103
Creatinine, Ser: 0.83
GFR calc Af Amer: >60
GFR calc non Af Amer: >60
Glucose, Bld: 132 — ABNORMAL HIGH
Potassium: 4
Sodium: 137

## 2011-08-18 LAB — SAMPLE TO BLOOD BANK

## 2011-08-18 LAB — VITAMIN B12: Vitamin B-12: 767 (ref 211–911)

## 2011-08-18 LAB — PREPARE RBC (CROSSMATCH)

## 2011-08-18 LAB — APTT: aPTT: 29

## 2011-08-18 LAB — ABO/RH: ABO/RH(D): B POS

## 2011-08-22 IMAGING — CR DG ABDOMEN 2V
3 series · 3 of 3 positions shown · non-contrast
Comparison: Abdomen films of 12/10/2004

CLINICAL DATA: Abdominal pain, particularly in the left upper
quadrant

ABDOMEN - 2 VIEW

[view not recorded (1 of 3)]
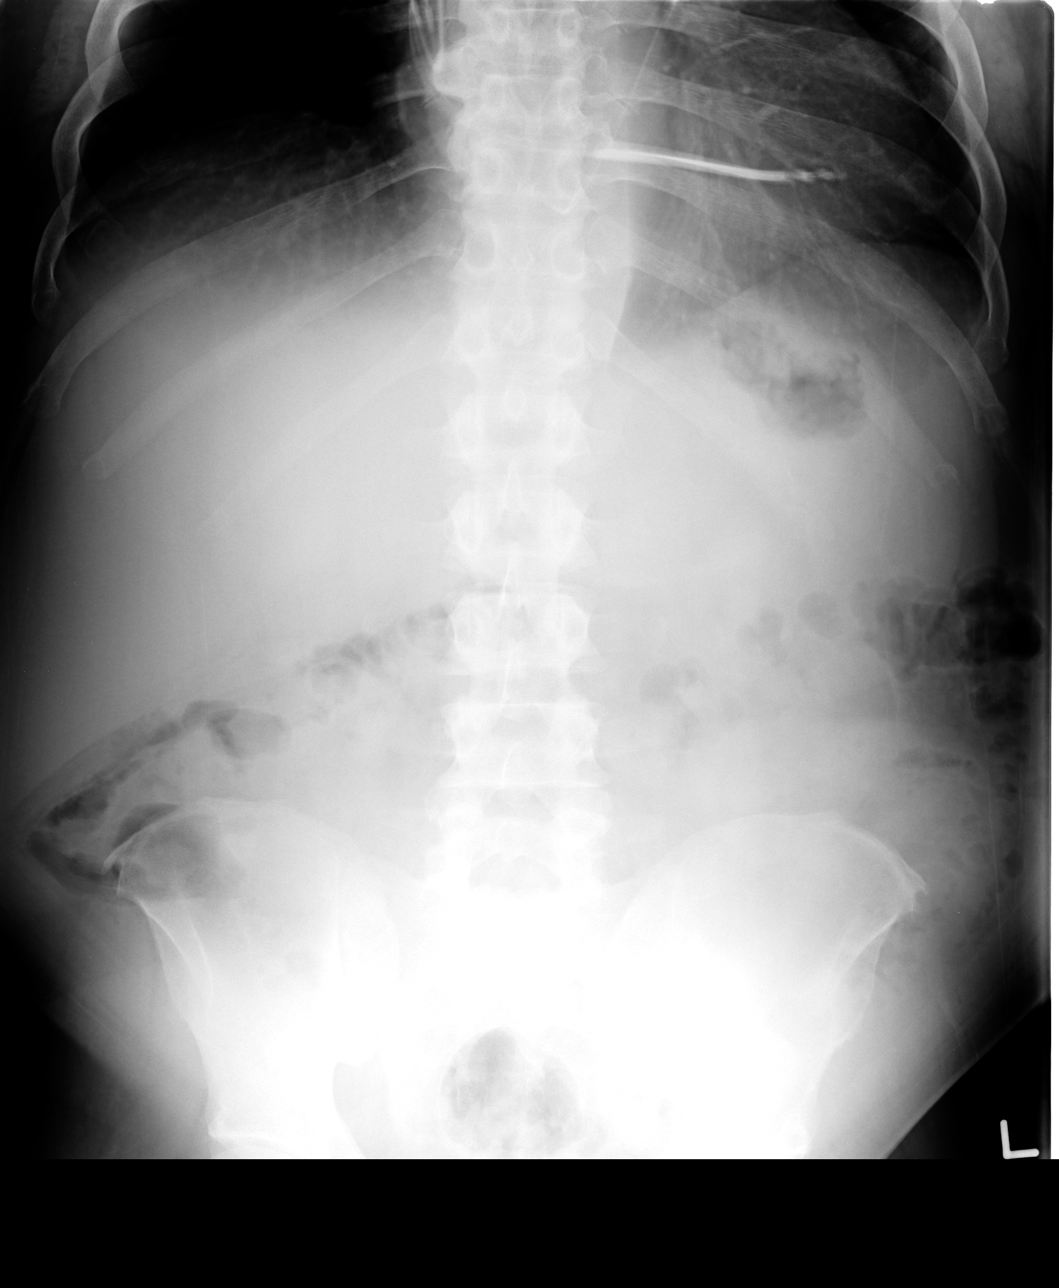

[view not recorded (2 of 3)]
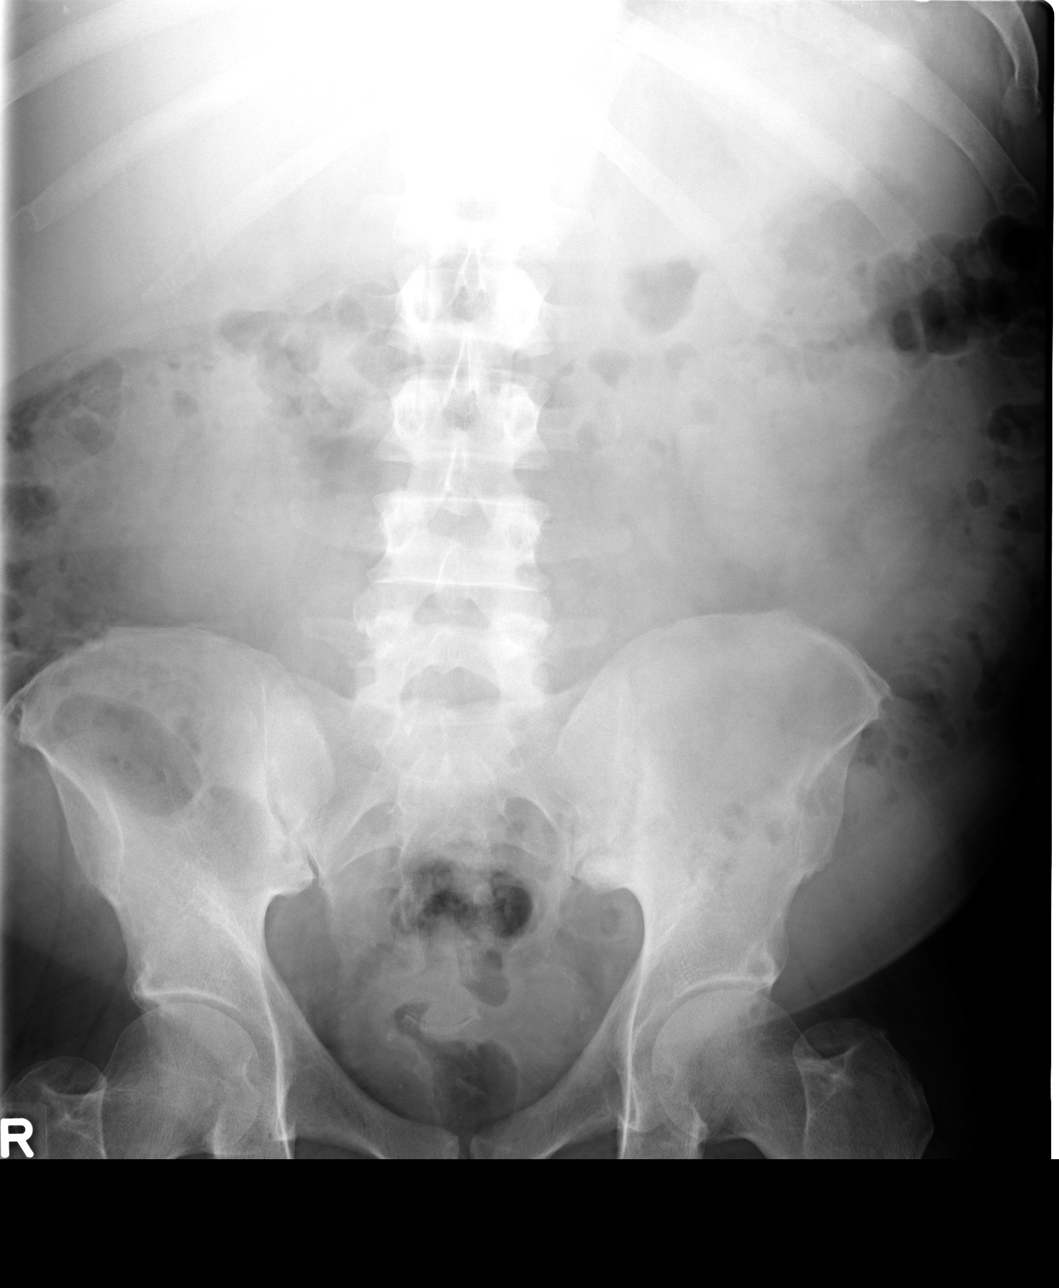

[view not recorded (3 of 3)]
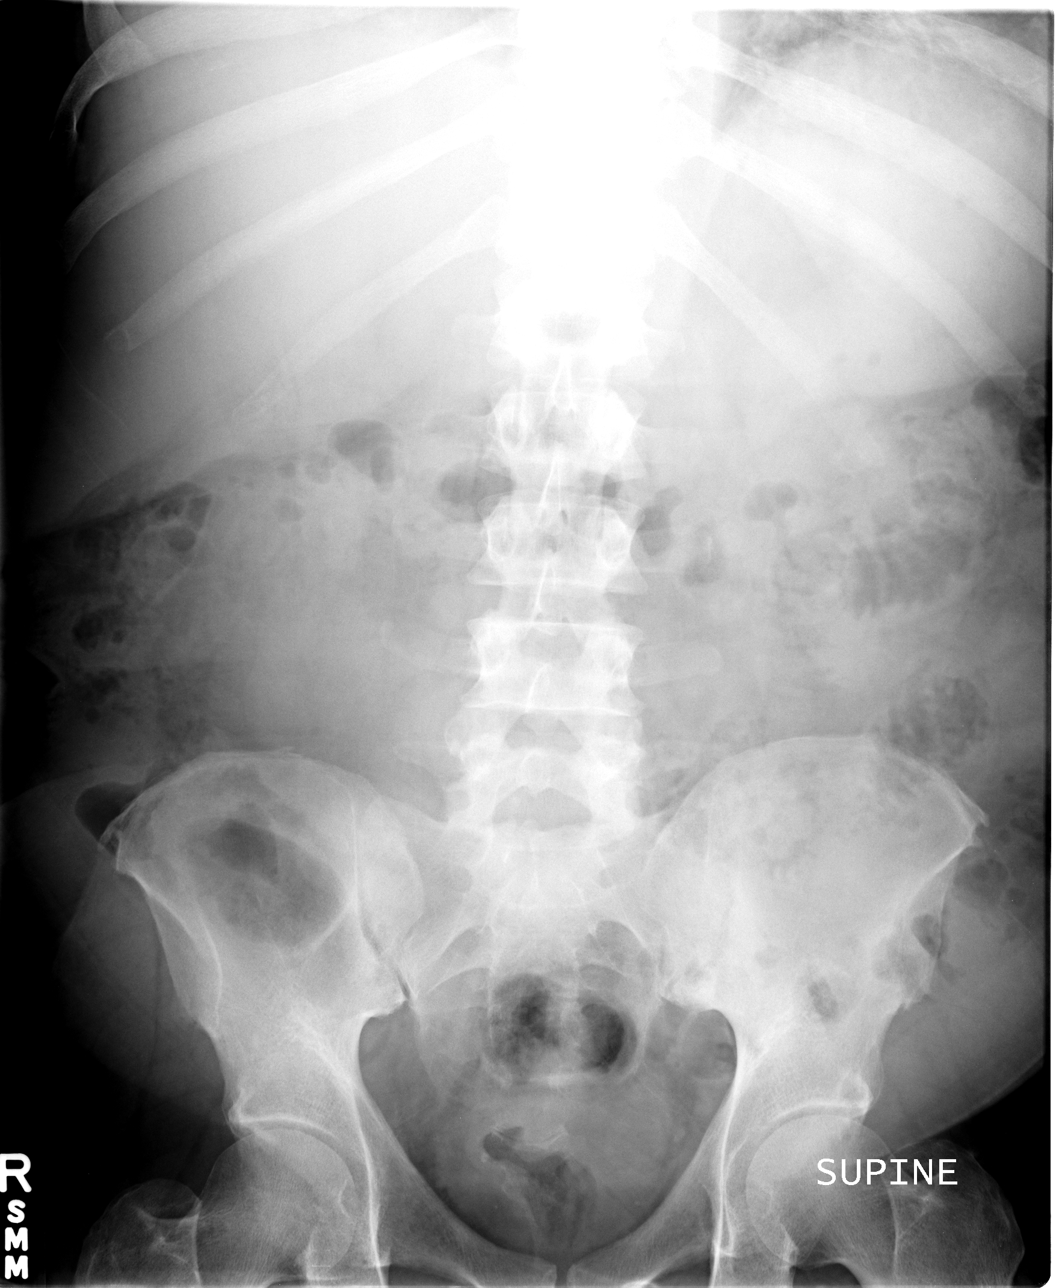

[3 of 3 positions shown; findings below may reference images not displayed]

FINDINGS: There is a moderate amount of feces throughout the colon.
Rounded air collections along the colon probably represent colonic
diverticula particularly within the descending and sigmoid colon.
No bowel obstruction is seen.  No opaque calculi are noted.  No
free air is seen on the erect view.
IMPRESSION: Moderate amount of feces in the colon with probable colonic
diverticula.  No obstruction or free air.

## 2011-08-25 ENCOUNTER — Encounter (HOSPITAL_COMMUNITY): Payer: Medicaid Other

## 2011-08-26 ENCOUNTER — Telehealth (HOSPITAL_COMMUNITY): Payer: Self-pay | Admitting: *Deleted

## 2011-08-26 MED ORDER — TORSEMIDE 20 MG PO TABS: 20.0000 mg | ORAL_TABLET | Freq: Two times a day (BID) | ORAL | Status: DC

## 2011-08-26 NOTE — Telephone Encounter (Signed)
David Gregory called this am, he is out of medication and needs a refill for his torsemide.

## 2011-08-26 NOTE — Telephone Encounter (Signed)
Pt aware rx was sent into pharmacy. °

## 2011-09-04 ENCOUNTER — Ambulatory Visit (HOSPITAL_COMMUNITY)
Admission: RE | Admit: 2011-09-04 | Discharge: 2011-09-04 | Disposition: A | Discharge status: Home / Self Care | Payer: Medicaid Other | Source: Ambulatory Visit | Attending: Internal Medicine | Admitting: Internal Medicine

## 2011-09-04 VITALS — BP 122/84 | HR 95 | Wt 227.0 lb

## 2011-09-04 DIAGNOSIS — I5022 Chronic systolic (congestive) heart failure: Secondary | ICD-10-CM

## 2011-09-04 MED ORDER — METOLAZONE 2.5 MG PO TABS: 2.5000 mg | ORAL_TABLET | ORAL | Status: DC | PRN

## 2011-09-04 NOTE — Patient Instructions (Signed)
Please take Metolazone 2.5 mg daily for two days  Follow up in 1 month

## 2011-09-04 NOTE — Assessment & Plan Note (Addendum)
NYHA II.  Weight up significantly. I think this is due to combination of fluid overload but also weight gain from prednisone is playing a significant role.  Will give Metolazone 2.5 mg daily for two days. He will have RHC and biopsy next week at [facility].Discussed diet compliance and he verbalized understanding.  Follow up in one month.   Patient seen and examined with Darrick Grinder, NP. We discussed all aspects of the encounter. I agree with the assessment and plan as stated above.

## 2011-09-04 NOTE — Progress Notes (Signed)
HPI:  David Gregory is a 45 year old male with h/o CHF due to nonischemic cardiomyopathy  EF 15-25% s/p BiVICD.  He also has a history of HTN, DM, CRI, previous tobacco and alcohol use (now abstinent since 12/10). Underwent OHTx in March 2012.   Post-op course c/b by RH failure with associated renal failure requiring an RVAD and TV ring annuloplasty. Has had mild rejection in June but all subsequent biopsies OK.   RHC at [facility] in May 2012 RA 12 PA 53/30 (36) PCW 13 Echo at [facility] 8/16: LVEF normal RV moderate HK. TV ring with mean gradient 7. No TR  He has been experiencing fluid overload that was complicated by Cr>3 but with ~15 pound gain between visit it was best option would be hospitalization for diuresis.  He was admitted on 9/4 for ultrafiltration.  He diuresed ~7 kg but with elevated Cr of 3.25 ultrafiltration was stopped and he was transitioned to oral demadex.  During hospitalization his digoxin was discontinued secondary to his renal function and his hydralazine was discontinued secondary to soft BPs.  Revatio was also increased to 60 mg TID.    Cardiac biopsy   He returns today for follow up.  He has been maintaining his weight 206-215 lbs. He is taking torsemide 40 mg BID.  SOB with additional weight.   He denies dyspnea, orthopnea or PND. Complaining of abdominal distention.   He has been wearing his compression hose about every other day.  He has been eating ham. No leg edema and he is wearing compression stockings.    ROS: All systems negative except as listed in HPI, PMH and Problem List.  Past Medical History  Diagnosis Date  . VENTRICULAR TACHYCARDIA   . SYSTOLIC HEART FAILURE, CHRONIC   . SYSTOLIC HEART FAILURE, ACUTE ON CHRONIC   . CONGESTIVE HEART FAILURE UNSPECIFIED   . Gout, unspecified   . ERECTILE DYSFUNCTION, ORGANIC   . Edema   . Diarrhea   . CHEST PAIN UNSPECIFIED   . Carpal tunnel syndrome     Current Outpatient Prescriptions  Medication Sig Dispense Refill    . allopurinol (ZYLOPRIM) 100 MG tablet Take 100 mg by mouth daily.        Marland Kitchen aspirin 81 MG tablet Take 81 mg by mouth daily.        . Calcium Carbonate-Vitamin D (CALCIUM-VITAMIN D) 600-200 MG-UNIT CAPS Take 1 capsule by mouth 2 (two) times daily.        . carvedilol (COREG) 12.5 MG tablet Take 12.5 mg by mouth 2 (two) times daily.        . insulin NPH-insulin regular (NOVOLIN 70/30) (70-30) 100 UNIT/ML injection Inject 56 Units into the skin daily with breakfast. 22 units in PM       . levothyroxine (SYNTHROID, LEVOTHROID) 25 MCG tablet Take 25 mcg by mouth daily.        . methotrexate (RHEUMATREX) 5 MG tablet Take 5 mg by mouth 2 (two) times a week. Caution: Chemotherapy. Protect from light.       . Multiple Vitamin (MULTIVITAMIN) tablet Take 1 tablet by mouth daily.        . mycophenolate (CELLCEPT) 500 MG tablet Take 1,500 mg by mouth 2 (two) times daily.        Marland Kitchen oxyCODONE (OXY IR/ROXICODONE) 5 MG immediate release tablet Take 5 mg by mouth every 4 (four) hours as needed.        . pantoprazole (PROTONIX) 40 MG tablet Take 40 mg by  mouth daily.        . pravastatin (PRAVACHOL) 20 MG tablet Take 20 mg by mouth daily.        . predniSONE (DELTASONE) 5 MG tablet Take 12.5 mg by mouth daily.        . sildenafil (REVATIO) 20 MG tablet Take 60 mg by mouth 3 (three) times daily.        . tacrolimus (PROGRAF) 1 MG capsule Take 8 mg in AM and 7 mg in PM       . torsemide (DEMADEX) 20 MG tablet Take 40 mg by mouth 2 (two) times daily.        . valACYclovir (VALTREX) 1000 MG tablet Take 1,000 mg by mouth daily.           PHYSICAL EXAM: Filed Vitals:   09/04/11 1446  BP: 122/84  Pulse: 95  Wt 227 (209)  General:  Well appearing. No resp difficulty HEENT: normal Neck: supple. JVP 10-11 Carotids 2+ bilaterally; no bruits. No lymphadenopathy or thryomegaly appreciated. Cor: PMI normal. Regular rate & rhythm.   Lungs: clear Abdomen: distended  Non-tender. No bruits or masses. Good bowel  sounds. Extremities: no cyanosis, clubbing, rash. RLE and LLE 2+ edema  Neuro: alert & orientedx3, cranial nerves grossly intact. Moves all 4 extremities w/o difficulty. Affect pleasant    ASSESSMENT & PLAN:

## 2011-09-21 NOTE — Progress Notes (Signed)
Patient seen and examined with Amy Clegg, NP. We discussed all aspects of the encounter. I agree with the assessment and plan as stated above.   

## 2011-09-21 NOTE — Progress Notes (Signed)
Patient seen and examined with Nicki Bradley PA-C. We discussed all aspects of the encounter. I agree with the assessment and plan as stated above.   

## 2011-09-25 ENCOUNTER — Telehealth (HOSPITAL_COMMUNITY): Payer: Self-pay | Admitting: *Deleted

## 2011-09-25 MED ORDER — TORSEMIDE 20 MG PO TABS: 40.0000 mg | ORAL_TABLET | Freq: Two times a day (BID) | ORAL | Status: DC

## 2011-09-25 NOTE — Telephone Encounter (Signed)
Pt called this am to report his wt is increasing for a while now, yesterday it was 225 and 229 lb today which is a lot for him, he states that he can tell he is retaining fluid.  He has been taking torsemide 40mg  bid, on 10/11 he was given metolazone for 2 days but states it did not help any.  He got his refill from [facility] yesterday but they gave him furosemide instead of torsemide.  Per Dr Haroldine Laws have pt take torsemide 80 mg bid today and call back tomorrow with update if wt not down and not urinating he will come to clinic for IV Lasix.  Pt is aware and agreeable, have called [facility] and straightened out meds

## 2011-09-26 ENCOUNTER — Telehealth (HOSPITAL_COMMUNITY): Payer: Self-pay | Admitting: *Deleted

## 2011-09-26 NOTE — Telephone Encounter (Signed)
Mr David Gregory called, returning your call

## 2011-09-26 NOTE — Telephone Encounter (Signed)
Called to check on pt this AM he reports wt down to 223 lbs today which is down 6 lbs from yesterday and he can tell a difference, per Dr Haroldine Laws torsemide 40 mg bid over weekend pt is aware and scheduled for f/u on Mon 11/5

## 2011-09-29 ENCOUNTER — Ambulatory Visit (HOSPITAL_COMMUNITY)
Admission: RE | Admit: 2011-09-29 | Discharge: 2011-09-29 | Disposition: A | Discharge status: Home / Self Care | Payer: Medicaid Other | Source: Ambulatory Visit | Attending: Internal Medicine | Admitting: Internal Medicine

## 2011-09-29 VITALS — BP 122/70 | HR 97 | Wt 228.5 lb

## 2011-09-29 DIAGNOSIS — I5022 Chronic systolic (congestive) heart failure: Secondary | ICD-10-CM

## 2011-09-29 LAB — BASIC METABOLIC PANEL
BUN: 74 mg/dL — ABNORMAL HIGH (ref 6–23)
CO2: 27 mEq/L (ref 19–32)
Calcium: 9.8 mg/dL (ref 8.4–10.5)
Chloride: 102 mEq/L (ref 96–112)
Creatinine, Ser: 2.98 mg/dL — ABNORMAL HIGH (ref 0.50–1.35)
GFR calc Af Amer: 27 mL/min — ABNORMAL LOW (ref >90)
GFR calc non Af Amer: 24 mL/min — ABNORMAL LOW (ref >90)
Glucose, Bld: 89 mg/dL (ref 70–99)
Potassium: 3.9 mEq/L (ref 3.5–5.1)
Sodium: 143 mEq/L (ref 135–145)

## 2011-09-29 NOTE — Patient Instructions (Signed)
Please continue to weigh and record daily  Follow up in 2 weeks.

## 2011-09-29 NOTE — Assessment & Plan Note (Addendum)
NYHA III. Volume status remains mildly elevated. Compliant with all medications. RHC results from [facility] assessed RA  27 This is a very difficult due to kidney function. Will check BMET today. Dr Haroldine Laws will follow up with Dr Posey Pronto.   Patient seen and examined with Darrick Grinder, NP. We discussed all aspects of the encounter. I agree with the assessment and plan as stated above. Biventricular pressures are significantly elevated and his right heart failure and renal insufficiency have made volume management a challenge. I suspect he is heading toward dialysis in the not too distant future. Will check labs and then adjusts diuretics as tolerated. He has f/u at [facility] this week as well. Reinforced need for dietary compliance.

## 2011-09-29 NOTE — Progress Notes (Signed)
HPI:  David Gregory is a 45 year old male with h/o CHF due to nonischemic cardiomyopathy  EF 15-25% s/p BiVICD.  He also has a history of HTN, DM, CRI, previous tobacco and alcohol use (now abstinent since 12/10). Underwent OHTx in March 2012.   Post-op course c/b by RH failure with associated renal failure requiring an RVAD and TV ring annuloplasty. Has had mild rejection in June but all subsequent biopsies OK.   RHC at [facility] in May 2012 RA 12 PA 53/30 (36) PCW 13 Echo at [facility] 8/16: LVEF normal RV moderate HK. TV ring with mean gradient 7. No TR  He has been experiencing fluid overload that was complicated by Cr>3 but with ~15 pound gain between visit it was best option would be hospitalization for diuresis.  He was admitted on 9/4 for ultrafiltration.  He diuresed ~7 kg but with elevated Cr of 3.25 ultrafiltration was stopped and he was transitioned to oral demadex.  During hospitalization his digoxin was discontinued secondary to his renal function and his hydralazine was discontinued secondary to soft BPs.  Revatio was also increased to 60 mg TID.    Cardiac biopsy   09/11/2011 [facility] RHC RA 27 RV 61/17 PA 61/23 (44) PCWP 23 V= 26 CI 3.5 PVR 2.8  He returns today for follow up.  Weight is up 223-227. He did follow up at [facility] 2 weeks ago ([facility] cardiac biopsy) and rejection was noted with no change made to the prednisone.  11/3 increased  torsemide 80 mg BID for that day only. (He reports a poor response from Metolazone in the past).  He does not feel like he has had a response from Torsemide.  SOB going up steps. He denies dyspnea, orthopnea or PND. Complaining of abdominal distention.  He has been wearing his compression hose every day except on weekend.   ROS: All systems negative except as listed in HPI, PMH and Problem List.  Past Medical History  Diagnosis Date  . VENTRICULAR TACHYCARDIA   . SYSTOLIC HEART FAILURE, CHRONIC   . SYSTOLIC HEART FAILURE, ACUTE ON CHRONIC   . CONGESTIVE HEART  FAILURE UNSPECIFIED   . Gout, unspecified   . ERECTILE DYSFUNCTION, ORGANIC   . Edema   . Diarrhea   . CHEST PAIN UNSPECIFIED   . Carpal tunnel syndrome     Current Outpatient Prescriptions  Medication Sig Dispense Refill  . allopurinol (ZYLOPRIM) 100 MG tablet Take 100 mg by mouth daily.        Marland Kitchen aspirin 81 MG tablet Take 81 mg by mouth daily.        . Calcium Carbonate-Vitamin D (CALCIUM-VITAMIN D) 600-200 MG-UNIT CAPS Take 1 capsule by mouth 2 (two) times daily.        . carvedilol (COREG) 12.5 MG tablet Take 12.5 mg by mouth 2 (two) times daily.        . insulin NPH-insulin regular (NOVOLIN 70/30) (70-30) 100 UNIT/ML injection Inject 56 Units into the skin daily with breakfast. 22 units in PM       . levothyroxine (SYNTHROID, LEVOTHROID) 25 MCG tablet Take 25 mcg by mouth daily.        . methotrexate (RHEUMATREX) 5 MG tablet Take 5 mg by mouth 2 (two) times a week. Caution: Chemotherapy. Protect from light.       . metolazone (ZAROXOLYN) 2.5 MG tablet Take 1 tablet (2.5 mg total) by mouth as needed.      . Multiple Vitamin (MULTIVITAMIN) tablet Take 1 tablet  by mouth daily.        . mycophenolate (CELLCEPT) 500 MG tablet Take 1,500 mg by mouth 2 (two) times daily.        Marland Kitchen oxyCODONE (OXY IR/ROXICODONE) 5 MG immediate release tablet Take 5 mg by mouth every 4 (four) hours as needed.        . pantoprazole (PROTONIX) 40 MG tablet Take 40 mg by mouth daily.        . pravastatin (PRAVACHOL) 20 MG tablet Take 20 mg by mouth daily.        . predniSONE (DELTASONE) 5 MG tablet Take 10 mg by mouth daily.        . sildenafil (REVATIO) 20 MG tablet Take 60 mg by mouth 3 (three) times daily.        . tacrolimus (PROGRAF) 1 MG capsule Take 8 mg in AM and 7 mg in PM       . torsemide (DEMADEX) 20 MG tablet Take 2 tablets (40 mg total) by mouth 2 (two) times daily.  120 tablet  6  . valACYclovir (VALTREX) 1000 MG tablet Take 1,000 mg by mouth daily.           PHYSICAL EXAM: Filed Vitals:    09/29/11 1053  BP: 122/70  Pulse: 97  Wt 228 (227)    General:  Well appearing. No resp difficulty HEENT: normal Neck: supple. JVP 8-9  Carotids 2+ bilaterally; no bruits. No lymphadenopathy or thryomegaly appreciated. Cor: PMI normal. Regular rate & rhythm.   Lungs: clear Abdomen: distended  Non-tender. No bruits or masses. Good bowel sounds. Extremities: no cyanosis, clubbing, rash. RLE and LLE  2+ edema  Neuro: alert & orientedx3, cranial nerves grossly intact. Moves all 4 extremities w/o difficulty. Affect pleasant    ASSESSMENT & PLAN:

## 2011-10-06 ENCOUNTER — Telehealth: Payer: Self-pay | Admitting: Internal Medicine

## 2011-10-06 NOTE — Telephone Encounter (Signed)
Pt calling asking to speak to [facility] even though pt is under the care of Dr. Haroldine Laws. Pt stated that Claiborne Billings told him to call and ask to speak with her. Please return pt call to discuss further.

## 2011-10-06 NOTE — Telephone Encounter (Signed)
Returned call to patient.

## 2011-10-10 ENCOUNTER — Telehealth (HOSPITAL_COMMUNITY): Payer: Self-pay | Admitting: *Deleted

## 2011-10-10 NOTE — Telephone Encounter (Signed)
Mr David Gregory called this am, he is having some trouble with his compression stockings and would like a call back.  Thanks!

## 2011-10-10 NOTE — Telephone Encounter (Signed)
Attempted to call pt, no answer. 

## 2011-10-11 NOTE — Progress Notes (Signed)
Patient seen and examined with Amy Clegg, NP. We discussed all aspects of the encounter. I agree with the assessment and plan as stated above.   

## 2011-10-13 ENCOUNTER — Ambulatory Visit (HOSPITAL_COMMUNITY)
Admission: RE | Admit: 2011-10-13 | Discharge: 2011-10-13 | Disposition: A | Discharge status: Home / Self Care | Payer: Medicaid Other | Source: Ambulatory Visit | Attending: Internal Medicine | Admitting: Internal Medicine

## 2011-10-13 VITALS — BP 124/84 | HR 95 | Wt 232.2 lb

## 2011-10-13 DIAGNOSIS — I5022 Chronic systolic (congestive) heart failure: Secondary | ICD-10-CM

## 2011-10-13 MED ORDER — TORSEMIDE 20 MG PO TABS: 40.0000 mg | ORAL_TABLET | Freq: Two times a day (BID) | ORAL | Status: DC

## 2011-10-13 NOTE — Telephone Encounter (Signed)
Pt was seen 11/19

## 2011-10-13 NOTE — Assessment & Plan Note (Addendum)
NYHA III. Volume status remains elevated. Reviewed BMET results completed at [facility] 11/8. Difficult situation due to kidney function. Dr Jeffie Pollock discussed with volume status with Dr Stann Mainland. He will increase Demadex 80 mg BID for the next 4 days then return to 40 mg bid.  Check BMET Friday. Follow up on Monday .  Patient seen and examined with Darrick Grinder, NP. We discussed all aspects of the encounter. I agree with the assessment and plan as stated above. Having problems with both L & R HFwith significant volume overload by recent RHC. Situation complicated by weight gain from steroids and significant renal impairment - suspect he will need HD within the next year.

## 2011-10-13 NOTE — Patient Instructions (Addendum)
Continue to weigh and record daily  Take Demadex 80 mg twice a day 11/20 11/21 11/22  11/23  Check BMET on Friday  Follow up in 1 weeks.

## 2011-10-13 NOTE — Progress Notes (Signed)
HPI:  David Gregory is a 45 year old male with h/o CHF due to nonischemic cardiomyopathy  EF 15-25% s/p BiVICD.  He also has a history of HTN, DM, CRI, previous tobacco and alcohol use (now abstinent since 12/10). Underwent OHTx in March 2012.   Post-op course c/b by RH failure with associated renal failure requiring an RVAD and TV ring annuloplasty. Has had mild rejection in June but all subsequent biopsies OK.   RHC at [facility] in May 2012 RA 12 PA 53/30 (36) PCW 13 Echo at [facility] 8/16: LVEF normal RV moderate HK. TV ring with mean gradient 7. No TR  He has been experiencing fluid overload that was complicated by Cr>3 but with ~15 pound gain between visit it was best option would be hospitalization for diuresis.  He was admitted on 9/4 for ultrafiltration.  He diuresed ~7 kg but with elevated Cr of 3.25 ultrafiltration was stopped and he was transitioned to oral demadex.  During hospitalization his digoxin was discontinued secondary to his renal function and his hydralazine was discontinued secondary to soft BPs.  Revatio was also increased to 60 mg TID.    Cardiac biopsy   09/11/2011 [facility] RHC RA 27 RV 61/17 PA 61/23 (44) PCWP 23 V= 26 CI 3.5 PVR 2.8  10/02/2011  Creatinine 2.9 Potassium 4.0 Magnesium 1.5  He returns today for follow up.  Weight at home 225.  Weight continue to increase He did follow at [facility] for biopsy of heart 11/8/ 20102. He continues on prednisone 10 mg daily. He continues on  torsemide 40 mg BID for that day only. (He reports a poor response from Metolazone in the past).  He does not feel like he has had a response from Torsemide. He is reports more dyspnea and fatigue.  Denies orthopnea or PND. Complaining of abdominal distention.  Lower extremity edema. He has been wearing his compression hose every day except on weekend.Following on low salt diet. Poor appetite.   ROS: All systems negative except as listed in HPI, PMH and Problem List.  Past Medical History  Diagnosis Date   . VENTRICULAR TACHYCARDIA   . SYSTOLIC HEART FAILURE, CHRONIC   . SYSTOLIC HEART FAILURE, ACUTE ON CHRONIC   . CONGESTIVE HEART FAILURE UNSPECIFIED   . Gout, unspecified   . ERECTILE DYSFUNCTION, ORGANIC   . Edema   . Diarrhea   . CHEST PAIN UNSPECIFIED   . Carpal tunnel syndrome     Current Outpatient Prescriptions  Medication Sig Dispense Refill  . allopurinol (ZYLOPRIM) 100 MG tablet Take 100 mg by mouth daily.        Marland Kitchen aspirin 81 MG tablet Take 81 mg by mouth daily.        . Calcium Carbonate-Vitamin D (CALCIUM-VITAMIN D) 600-200 MG-UNIT CAPS Take 1 capsule by mouth 2 (two) times daily.        . carvedilol (COREG) 12.5 MG tablet Take 12.5 mg by mouth 2 (two) times daily.        . colchicine 0.6 MG tablet Take 0.6 mg by mouth daily as needed.        . insulin NPH-insulin regular (NOVOLIN 70/30) (70-30) 100 UNIT/ML injection Inject 70 Units into the skin daily with breakfast. 26 units in PM      . levothyroxine (SYNTHROID, LEVOTHROID) 25 MCG tablet Take 25 mcg by mouth daily.        . methotrexate (RHEUMATREX) 5 MG tablet Take 5 mg by mouth 2 (two) times a week. Caution:  Chemotherapy. Protect from light.       . metolazone (ZAROXOLYN) 2.5 MG tablet Take 1 tablet (2.5 mg total) by mouth as needed.      . Multiple Vitamin (MULTIVITAMIN) tablet Take 1 tablet by mouth daily.        . mycophenolate (CELLCEPT) 500 MG tablet Take 1,500 mg by mouth 2 (two) times daily.        . pantoprazole (PROTONIX) 40 MG tablet Take 40 mg by mouth daily.        . pravastatin (PRAVACHOL) 20 MG tablet Take 20 mg by mouth daily.        . predniSONE (DELTASONE) 5 MG tablet Take 10 mg by mouth daily.        . sildenafil (REVATIO) 20 MG tablet Take 60 mg by mouth 3 (three) times daily.        . tacrolimus (PROGRAF) 1 MG capsule Take 8 mg in AM and 7 mg in PM       . torsemide (DEMADEX) 20 MG tablet Take 40 mg by mouth 2 (two) times daily.          PHYSICAL EXAM: Filed Vitals:   10/13/11 1019  BP: 124/84   Pulse: 95  Wt 232.25 (228.5)    General:  Well appearing. No resp difficulty HEENT: normal Neck: supple. JVP to ear Carotids 2+ bilaterally; no bruits. No lymphadenopathy or thryomegaly appreciated. Cor: PMI normal. Regular rate & rhythm.   Lungs: clear Abdomen: distended  Non-tender. No bruits or masses. Good bowel sounds. Extremities: no cyanosis, clubbing, rash. RLE and LLE  1+ edema  Neuro: alert & orientedx3, cranial nerves grossly intact. Moves all 4 extremities w/o difficulty. Affect pleasant    ASSESSMENT & PLAN:

## 2011-10-14 ENCOUNTER — Encounter (HOSPITAL_COMMUNITY): Payer: Medicaid Other

## 2011-10-16 NOTE — Progress Notes (Signed)
Patient seen and examined with Amy Clegg, NP. We discussed all aspects of the encounter. I agree with the assessment and plan as stated above.   

## 2011-10-17 ENCOUNTER — Other Ambulatory Visit: Payer: Self-pay | Admitting: *Deleted

## 2011-10-17 ENCOUNTER — Other Ambulatory Visit: Payer: Medicaid Other | Admitting: *Deleted

## 2011-10-17 DIAGNOSIS — I5022 Chronic systolic (congestive) heart failure: Secondary | ICD-10-CM

## 2011-10-17 LAB — BASIC METABOLIC PANEL
BUN: 71 mg/dL — ABNORMAL HIGH (ref 6–23)
CO2: 22 mEq/L (ref 19–32)
Calcium: 9.1 mg/dL (ref 8.4–10.5)
Chloride: 104 mEq/L (ref 96–112)
Creat: 2.86 mg/dL — ABNORMAL HIGH (ref 0.50–1.35)
Glucose, Bld: 115 mg/dL — ABNORMAL HIGH (ref 70–99)
Potassium: 4.3 mEq/L (ref 3.5–5.3)
Sodium: 141 mEq/L (ref 135–145)

## 2011-10-20 ENCOUNTER — Inpatient Hospital Stay (HOSPITAL_COMMUNITY): Admission: RE | Admit: 2011-10-20 | Payer: Medicaid Other | Source: Ambulatory Visit

## 2011-10-27 ENCOUNTER — Ambulatory Visit (HOSPITAL_COMMUNITY)
Admission: RE | Admit: 2011-10-27 | Discharge: 2011-10-27 | Disposition: A | Discharge status: Home / Self Care | Payer: Medicaid Other | Source: Ambulatory Visit | Attending: Internal Medicine | Admitting: Internal Medicine

## 2011-10-27 VITALS — BP 136/94 | HR 90 | Wt 225.8 lb

## 2011-10-27 DIAGNOSIS — I5022 Chronic systolic (congestive) heart failure: Secondary | ICD-10-CM | POA: Insufficient documentation

## 2011-10-27 MED ORDER — TORSEMIDE 20 MG PO TABS: 60.0000 mg | ORAL_TABLET | Freq: Two times a day (BID) | ORAL | Status: DC

## 2011-10-27 NOTE — Patient Instructions (Addendum)
Do the following things EVERYDAY: 1) Weigh yourself in the morning before breakfast. Write it down and keep it in a log. 2) Take your medicines as prescribed 3) Eat low salt foods-Limit salt (sodium) to 2000mg  per day.  4) Stay as active as you can everyday   Take Demadex 60 mg bid   Check BMET next Monday at [place]  Follow up in 2 weeks.

## 2011-10-27 NOTE — Progress Notes (Signed)
Patient ID: David Gregory, male   DOB: 11/20/1966, 45 y.o.   MRN: [id number]     HPI:  David Gregory is a 45 year old male with h/o CHF due to nonischemic cardiomyopathy  EF 15-25% s/p BiVICD.  He also has a history of HTN, DM, CRI, previous tobacco and alcohol use (now abstinent since 12/10). Underwent OHTx in March 2012.   Post-op course c/b by RH failure with associated renal failure requiring an RVAD and TV ring annuloplasty. Has had mild rejection in June but all subsequent biopsies OK.   RHC at [facility] in May 2012 RA 12 PA 53/30 (36) PCW 13 Echo at [facility] 8/16: LVEF normal RV moderate HK. TV ring with mean gradient 7. No TR  He has been experiencing fluid overload that was complicated by Cr>3 but with ~15 pound gain between visit it was best option would be hospitalization for diuresis.  He was admitted on 9/4 for ultrafiltration.  He diuresed ~7 kg but with elevated Cr of 3.25 ultrafiltration was stopped and he was transitioned to oral demadex.  During hospitalization his digoxin was discontinued secondary to his renal function and his hydralazine was discontinued secondary to soft BPs.  Revatio was also increased to 60 mg TID.    Cardiac biopsy   09/11/2011 [facility] RHC RA 27 RV 61/17 PA 61/23 (44) PCWP 23 V= 26 CI 3.5 PVR 2.8  10/02/2011  Creatinine 2.98  Potassium 4.0 Magnesium 1.5  10/17/2011 Creatinine 2.86 Potassium 4.3  He returns today for follow up.  Last visit increased  Demadex to 80 mg  BID for 4 days then back to 40 mg bid. He is taking extra Demadex once every two weeks.  Weight at home 220-222. Overall weight decreased 7 pounds. Breathing better. SOB walking up steps. Denies CP/dizziness/ PND/ orthopnea. Able to sleep flat in bed. Good appetite. Tries to follow low salt diet. Limiting fluid intake.   ROS: All systems negative except as listed in HPI, PMH and Problem List.  Past Medical History  Diagnosis Date  . VENTRICULAR TACHYCARDIA   . SYSTOLIC HEART FAILURE, CHRONIC   .  SYSTOLIC HEART FAILURE, ACUTE ON CHRONIC   . CONGESTIVE HEART FAILURE UNSPECIFIED   . Gout, unspecified   . ERECTILE DYSFUNCTION, ORGANIC   . Edema   . Diarrhea   . CHEST PAIN UNSPECIFIED   . Carpal tunnel syndrome     Current Outpatient Prescriptions  Medication Sig Dispense Refill  . allopurinol (ZYLOPRIM) 100 MG tablet Take 100 mg by mouth daily.        Marland Kitchen aspirin 81 MG tablet Take 81 mg by mouth daily.        . Calcium Carbonate-Vitamin D (CALCIUM-VITAMIN D) 600-200 MG-UNIT CAPS Take 1 capsule by mouth 2 (two) times daily.        . carvedilol (COREG) 12.5 MG tablet Take 12.5 mg by mouth 2 (two) times daily.        . colchicine 0.6 MG tablet Take 0.6 mg by mouth daily as needed.        . insulin NPH-insulin regular (NOVOLIN 70/30) (70-30) 100 UNIT/ML injection Inject 70 Units into the skin daily with breakfast. 26 units in PM      . levothyroxine (SYNTHROID, LEVOTHROID) 25 MCG tablet Take 25 mcg by mouth daily.        . methotrexate (RHEUMATREX) 5 MG tablet Take 5 mg by mouth 2 (two) times a week. Caution: Chemotherapy. Protect from light.       Marland Kitchen  metolazone (ZAROXOLYN) 2.5 MG tablet Take 1 tablet (2.5 mg total) by mouth as needed.      . Multiple Vitamin (MULTIVITAMIN) tablet Take 1 tablet by mouth daily.        . mycophenolate (CELLCEPT) 500 MG tablet Take 1,500 mg by mouth 2 (two) times daily.        . pantoprazole (PROTONIX) 40 MG tablet Take 40 mg by mouth daily.        . pravastatin (PRAVACHOL) 20 MG tablet Take 20 mg by mouth daily.        . predniSONE (DELTASONE) 5 MG tablet Take 10 mg by mouth daily.        . sildenafil (REVATIO) 20 MG tablet Take 60 mg by mouth 3 (three) times daily.        . tacrolimus (PROGRAF) 1 MG capsule Take 8 mg in AM and 7 mg in PM       . torsemide (DEMADEX) 20 MG tablet Take 2 tablets (40 mg total) by mouth 2 (two) times daily. Take 80 mg bid for the next 4 days.  68 tablet  6     PHYSICAL EXAM: Filed Vitals:   10/27/11 1025  BP: 136/94    Pulse: 90  Wt 225 (232.25)    General:  Well appearing. No resp difficulty HEENT: normal Neck: supple. JVP 8-9  Carotids 2+ bilaterally; no bruits. No lymphadenopathy or thryomegaly appreciated. Cor: PMI normal. Regular rate & rhythm.  S3 Lungs: clear Abdomen: distended  Non-tender. No bruits or masses. Good bowel sounds. Extremities: no cyanosis, clubbing, rash. RLE and LLE trace  edema  Neuro: alert & orientedx3, cranial nerves grossly intact. Moves all 4 extremities w/o difficulty. Affect pleasant    ASSESSMENT & PLAN:

## 2011-10-27 NOTE — Assessment & Plan Note (Addendum)
S/p heart transplant. NYHA III-III. Volume status improving however still 14 pounds up. Renal function reviewed with mild improvement noted. Will increase Demadex 60 mg bid. Check BMET next week. Follow up in 2 weeks.   Patient seen and examined with Darrick Grinder, NP. We discussed all aspects of the encounter. I agree with the assessment and plan as stated above. David Gregory continues to struggle with significant RHF and cardiorenal syndrome. Will continue to push diuresis. We discussed the fact that he will likely need dialysis at some point in the not to distant future to adequately manage his volume status.    Total MD time spent = 30 mins with over 70% of that time dedicated to counseling and discussions described above.

## 2011-10-31 ENCOUNTER — Other Ambulatory Visit: Payer: Medicaid Other | Admitting: *Deleted

## 2011-10-31 ENCOUNTER — Telehealth: Payer: Self-pay | Admitting: *Deleted

## 2011-10-31 DIAGNOSIS — I5022 Chronic systolic (congestive) heart failure: Secondary | ICD-10-CM

## 2011-10-31 DIAGNOSIS — N189 Chronic kidney disease, unspecified: Secondary | ICD-10-CM

## 2011-10-31 LAB — BASIC METABOLIC PANEL
BUN: 90 mg/dL (ref 6–23)
CO2: 23 mEq/L (ref 19–32)
Calcium: 9.4 mg/dL (ref 8.4–10.5)
Chloride: 107 mEq/L (ref 96–112)
Creatinine, Ser: 3.9 mg/dL — ABNORMAL HIGH (ref 0.4–1.5)
GFR: 21.6 mL/min — ABNORMAL LOW (ref >60.00)
Glucose, Bld: 104 mg/dL — ABNORMAL HIGH (ref 70–99)
Potassium: 3.4 mEq/L — ABNORMAL LOW (ref 3.5–5.1)
Sodium: 143 mEq/L (ref 135–145)

## 2011-10-31 MED ORDER — TORSEMIDE 20 MG PO TABS: 40.0000 mg | ORAL_TABLET | Freq: Two times a day (BID) | ORAL | Status: DC

## 2011-10-31 NOTE — Telephone Encounter (Signed)
Critical lab called, BUN 90

## 2011-10-31 NOTE — Telephone Encounter (Signed)
Spoke with DOD DR Stanford Breed, pt to reduce demadex to 40 mg bid and have repeat bmet in one week. Unable to make contact with pt so called his mother/Beatrice, she wrote down decrease to medication and the need to go to Dr Haroldine Laws for lab draw. Lab was ordered but not scheduled, will forward to  Dr Haroldine Laws and his nurse to make contact for lab draw.

## 2011-11-01 ENCOUNTER — Telehealth (HOSPITAL_COMMUNITY): Payer: Self-pay | Admitting: Adult Health

## 2011-11-01 NOTE — Telephone Encounter (Signed)
David Gregory spoke with patient this am and told him to hold demadex. Will check labs Monday.

## 2011-11-03 ENCOUNTER — Other Ambulatory Visit: Payer: Medicaid Other | Admitting: *Deleted

## 2011-11-03 ENCOUNTER — Telehealth (HOSPITAL_COMMUNITY): Payer: Self-pay | Admitting: *Deleted

## 2011-11-03 NOTE — Telephone Encounter (Signed)
He forgot to have labs done he will go tomorrow, he states wt is down

## 2011-11-03 NOTE — Telephone Encounter (Signed)
Mr Sater called today regarding his labs from last week.  He would like a call back.  Thanks

## 2011-11-03 NOTE — Telephone Encounter (Signed)
Spoke w/pt he forgot he was suppose to go get labs today, he will go in the AM

## 2011-11-04 ENCOUNTER — Other Ambulatory Visit (INDEPENDENT_AMBULATORY_CARE_PROVIDER_SITE_OTHER): Payer: Medicaid Other | Admitting: *Deleted

## 2011-11-04 DIAGNOSIS — N189 Chronic kidney disease, unspecified: Secondary | ICD-10-CM

## 2011-11-04 LAB — BASIC METABOLIC PANEL
BUN: 76 mg/dL — ABNORMAL HIGH (ref 6–23)
CO2: 19 mEq/L (ref 19–32)
Calcium: 9.7 mg/dL (ref 8.4–10.5)
Chloride: 112 mEq/L (ref 96–112)
Creatinine, Ser: 3.3 mg/dL — ABNORMAL HIGH (ref 0.4–1.5)
GFR: 25.76 mL/min — ABNORMAL LOW (ref >60.00)
Glucose, Bld: 146 mg/dL — ABNORMAL HIGH (ref 70–99)
Potassium: 3.9 mEq/L (ref 3.5–5.1)
Sodium: 141 mEq/L (ref 135–145)

## 2011-11-04 NOTE — Telephone Encounter (Signed)
Instructed to stop Demadex for two days and repeat labs Monday 12/10

## 2011-11-07 ENCOUNTER — Ambulatory Visit (HOSPITAL_COMMUNITY)
Admission: RE | Admit: 2011-11-07 | Discharge: 2011-11-07 | Disposition: A | Discharge status: Home / Self Care | Payer: Medicaid Other | Source: Ambulatory Visit | Attending: Internal Medicine | Admitting: Internal Medicine

## 2011-11-07 VITALS — BP 140/88 | HR 92 | Wt 217.5 lb

## 2011-11-07 DIAGNOSIS — I5022 Chronic systolic (congestive) heart failure: Secondary | ICD-10-CM | POA: Insufficient documentation

## 2011-11-07 LAB — BASIC METABOLIC PANEL
BUN: 67 mg/dL — ABNORMAL HIGH (ref 6–23)
CO2: 17 mEq/L — ABNORMAL LOW (ref 19–32)
Calcium: 9.5 mg/dL (ref 8.4–10.5)
Chloride: 109 mEq/L (ref 96–112)
Creatinine, Ser: 3.43 mg/dL — ABNORMAL HIGH (ref 0.50–1.35)
GFR calc Af Amer: 23 mL/min — ABNORMAL LOW (ref >90)
GFR calc non Af Amer: 20 mL/min — ABNORMAL LOW (ref >90)
Glucose, Bld: 170 mg/dL — ABNORMAL HIGH (ref 70–99)
Potassium: 3.7 mEq/L (ref 3.5–5.1)
Sodium: 142 mEq/L (ref 135–145)

## 2011-11-07 NOTE — Progress Notes (Signed)
Patient ID: David Gregory, male   DOB: [birth year], 45 y.o.   MRN: [id number] Patient ID: David Gregory, male   DOB: November 19, 1966, 45 y.o.   MRN: [id number]     HPI:  David Gregory is a 45 year old male with h/o CHF due to nonischemic cardiomyopathy  EF 15-25% s/p BiVICD.  He also has a history of HTN, DM, CRI, previous tobacco and alcohol use (now abstinent since 12/10). Underwent OHTx in March 2012.   Post-op course c/b by RH failure with associated renal failure requiring an RVAD and TV ring annuloplasty. Has had mild rejection in June but all subsequent biopsies OK.   RHC at [facility] in May 2012 RA 12 PA 53/30 (36) PCW 13 Echo at [facility] 8/16: LVEF normal RV moderate HK. TV ring with mean gradient 7. No TR  Has been struggling with volume overload and predominantly RHF.  He was admitted on 9/4 for ultrafiltration.  He diuresed ~7 kg but with elevated Cr of 3.25 ultrafiltration was stopped and he was transitioned to oral demadex.  Revatio increased to 60 mg TID.    09/11/2011 [facility] RHC RA 27 RV 61/17 PA 61/23 (44) PCWP 23 V= 26 CI 3.5 PVR 2.8  10/02/2011 Creatinine 2.98  Potassium 4.0  10/17/2011 Creatinine 2.86 Potassium 4.3 11/04/2011   Creatinine 3.3 Potassium 3.9  He returns today for follow up.  Last visit increased  Demadex held for two days and resumed Demadex Monday at 40 mg bid. He has not required any extra Demadex.   Weight at home 217. Breathing better. SOB walking up steps. Denies CP/dizziness/ PND/ orthopnea. Able to sleep flat in bed. Good appetite. Tries to follow low salt diet. Limiting fluid intake.   ROS: All systems negative except as listed in HPI, PMH and Problem List.  Past Medical History  Diagnosis Date  . VENTRICULAR TACHYCARDIA   . SYSTOLIC HEART FAILURE, CHRONIC   . SYSTOLIC HEART FAILURE, ACUTE ON CHRONIC   . CONGESTIVE HEART FAILURE UNSPECIFIED   . Gout, unspecified   . ERECTILE DYSFUNCTION, ORGANIC   . Edema   . Diarrhea   . CHEST PAIN UNSPECIFIED   . Carpal  tunnel syndrome     Current Outpatient Prescriptions  Medication Sig Dispense Refill  . allopurinol (ZYLOPRIM) 100 MG tablet Take 100 mg by mouth daily.        Marland Kitchen aspirin 81 MG tablet Take 81 mg by mouth daily.        . Calcium Carbonate-Vitamin D (CALCIUM-VITAMIN D) 600-200 MG-UNIT CAPS Take 1 capsule by mouth 2 (two) times daily.        . colchicine 0.6 MG tablet Take 0.6 mg by mouth daily as needed.        . insulin NPH-insulin regular (NOVOLIN 70/30) (70-30) 100 UNIT/ML injection Inject 70 Units into the skin daily with breakfast. 26 units in PM      . levothyroxine (SYNTHROID, LEVOTHROID) 25 MCG tablet Take 25 mcg by mouth daily.        . methotrexate (RHEUMATREX) 5 MG tablet Take 5 mg by mouth 2 (two) times a week. Caution: Chemotherapy. Protect from light.       . metolazone (ZAROXOLYN) 2.5 MG tablet Take 1 tablet (2.5 mg total) by mouth as needed.      . Multiple Vitamin (MULTIVITAMIN) tablet Take 1 tablet by mouth daily.        . mycophenolate (CELLCEPT) 500 MG tablet Take 1,500 mg by mouth 2 (two) times daily.        Marland Kitchen  pantoprazole (PROTONIX) 40 MG tablet Take 40 mg by mouth daily.        . pravastatin (PRAVACHOL) 20 MG tablet Take 20 mg by mouth daily.        . predniSONE (DELTASONE) 5 MG tablet Take 10 mg by mouth daily.        . sildenafil (REVATIO) 20 MG tablet Take 60 mg by mouth 3 (three) times daily.        . tacrolimus (PROGRAF) 1 MG capsule Take 8 mg in AM and 7 mg in PM       . torsemide (DEMADEX) 20 MG tablet Take 2 tablets (40 mg total) by mouth 2 (two) times daily.  180 tablet  6     PHYSICAL EXAM: Filed Vitals:   11/07/11 1015  BP: 140/88  Pulse: 92  Wt 217 (225)    General:  Well appearing. No resp difficulty HEENT: normal Neck: supple. JVP 9-10 Carotids 2+ bilaterally; no bruits. No lymphadenopathy or thryomegaly appreciated. Cor: PMI normal. Regular rate & rhythm.  S3 Lungs: clear Abdomen: distended  Non-tender. No bruits or masses. Good bowel  sounds. Extremities: no cyanosis, clubbing, rash. No lower extremity   edema  Neuro: alert & orientedx3, cranial nerves grossly intact. Moves all 4 extremities w/o difficulty. Affect pleasant    ASSESSMENT & PLAN:

## 2011-11-07 NOTE — Patient Instructions (Addendum)
Do the following things EVERYDAY: 1) Weigh yourself in the morning before breakfast. Write it down and keep it in a log. 2) Take your medicines as prescribed 3) Eat low salt foods-Limit salt (sodium) to 2000mg  per day.  4) Stay as active as you can everyday  Follow up in 4 weeks

## 2011-11-07 NOTE — Assessment & Plan Note (Addendum)
S/p OHT this year. Volume status improved however renal function up. Creatinine 3.3 . Demadex held for 2 days. Demadex reduced 40 mg  BID Monday . Discussed possible nephrology referral if kidney function continues to decline. Stressed the importance of dietary compliance. Follow up in 1 month.  Patient seen and examined with Darrick Grinder, NP. We discussed all aspects of the encounter. I agree with the assessment and plan as stated above. Volume status much improved. But renal fx some worse. Now on reduced dose demadex. Will recheck labs today. Discussed need for possible dialysis down the road and stressed importance of dietary compliance to help minimize diuretic requirements and hopefully forestall progression of cardiorenal syndrome.

## 2011-11-10 ENCOUNTER — Encounter (HOSPITAL_COMMUNITY): Payer: Medicaid Other

## 2011-11-11 ENCOUNTER — Other Ambulatory Visit: Payer: Medicaid Other | Admitting: *Deleted

## 2011-11-12 ENCOUNTER — Other Ambulatory Visit (INDEPENDENT_AMBULATORY_CARE_PROVIDER_SITE_OTHER): Payer: Medicaid Other | Admitting: *Deleted

## 2011-11-12 DIAGNOSIS — I5022 Chronic systolic (congestive) heart failure: Secondary | ICD-10-CM

## 2011-11-12 LAB — BASIC METABOLIC PANEL
BUN: 72 mg/dL — ABNORMAL HIGH (ref 6–23)
CO2: 18 mEq/L — ABNORMAL LOW (ref 19–32)
Calcium: 9.3 mg/dL (ref 8.4–10.5)
Chloride: 116 mEq/L — ABNORMAL HIGH (ref 96–112)
Creatinine, Ser: 3.7 mg/dL — ABNORMAL HIGH (ref 0.4–1.5)
GFR: 22.88 mL/min — ABNORMAL LOW (ref >60.00)
Glucose, Bld: 143 mg/dL — ABNORMAL HIGH (ref 70–99)
Potassium: 3.7 mEq/L (ref 3.5–5.1)
Sodium: 143 mEq/L (ref 135–145)

## 2011-11-16 IMAGING — CR DG CHEST 1V PORT
1 series · 1 of 1 positions shown · non-contrast
Comparison: 11/21/2009.

CLINICAL DATA: Heart failure.  Line placement.

PORTABLE CHEST - 1 VIEW

[view not recorded]
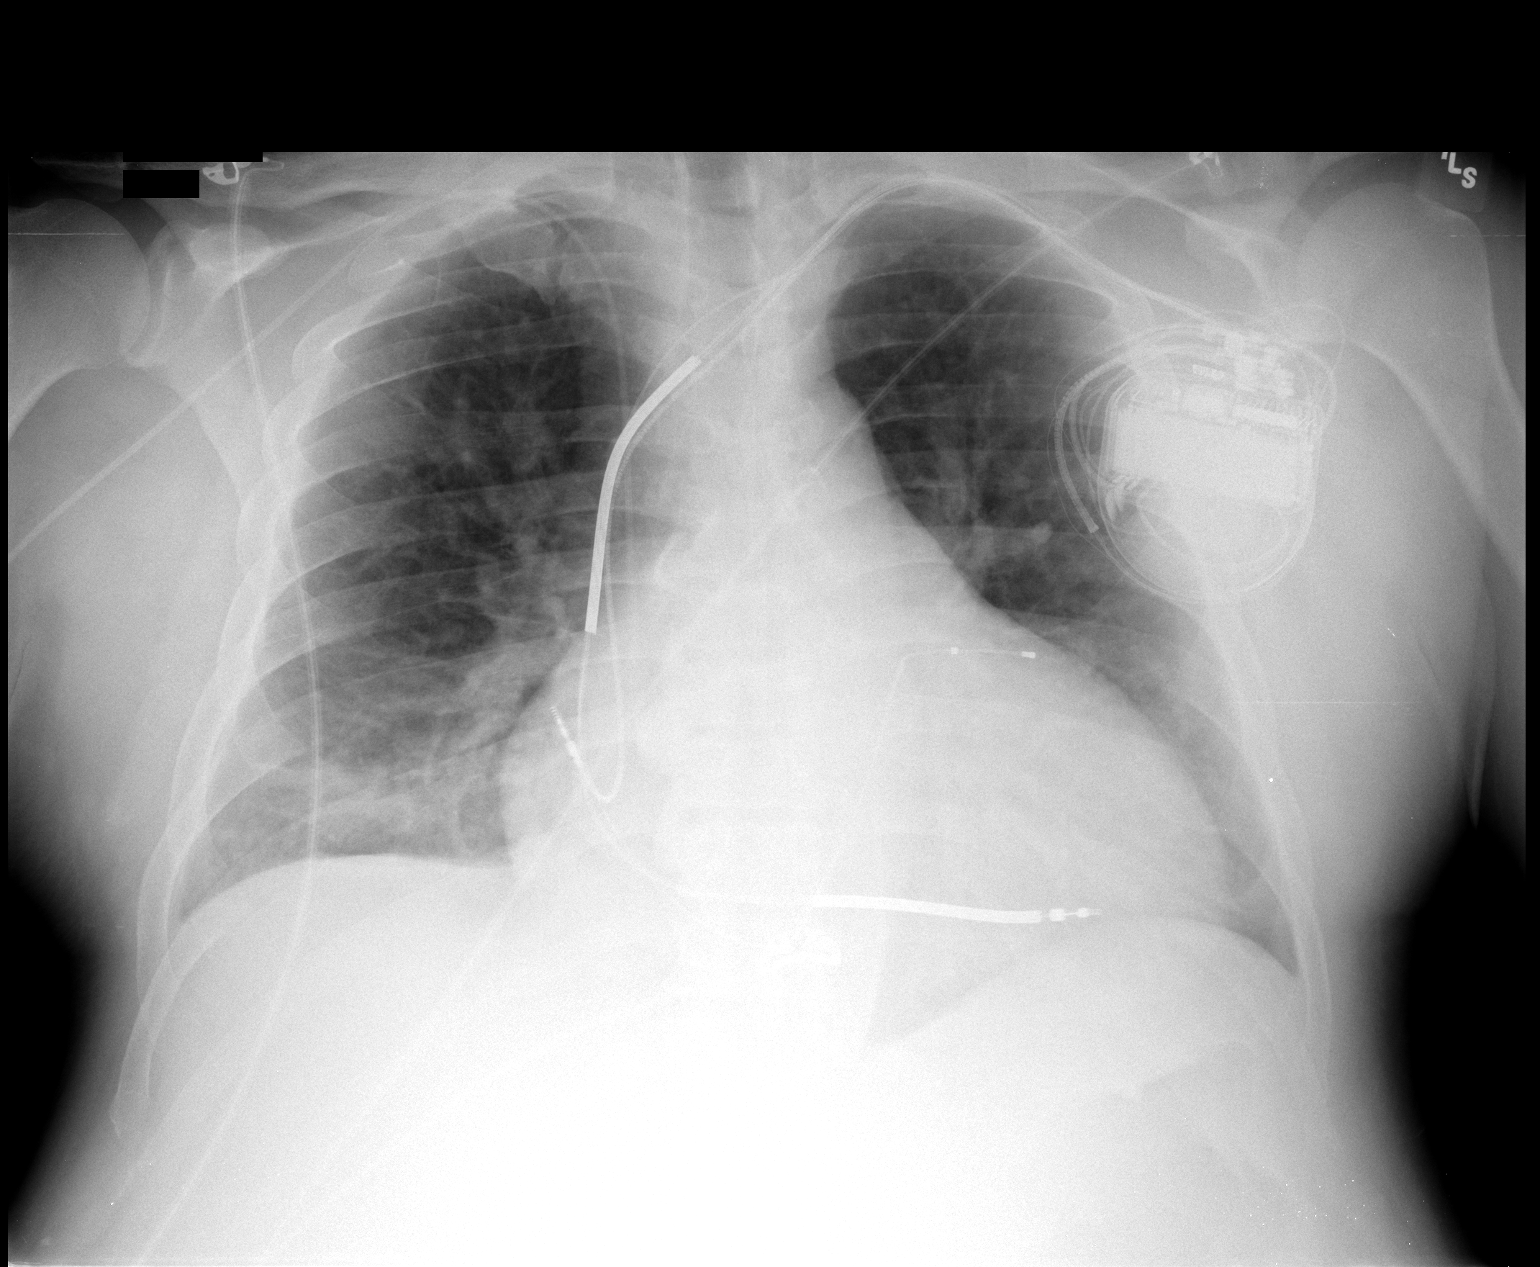

[1 of 1 positions shown; findings below may reference images not displayed]

FINDINGS: Cardiomegaly.  Unchanged cardiac pacemaker apparatus.
There is hazy opacity over both hemidiaphragms, greater on the
right than left.  This probably represents atelectasis in
conjunction with some airspace disease.  There is pulmonary
vascular congestion suggesting the cause of the airspace disease is
edema.

There is a new right upper extremity PICC with the tip terminating
in the lower SVC.  No pleural effusion is identified.
IMPRESSION: 1.  New right upper extremity PICC with the tip terminating in the
lower SVC.
2.  Cardiomegaly with bilateral basilar airspace disease and
atelectasis.

## 2011-11-17 ENCOUNTER — Telehealth (HOSPITAL_COMMUNITY): Payer: Self-pay | Admitting: *Deleted

## 2011-11-17 DIAGNOSIS — I2789 Other specified pulmonary heart diseases: Secondary | ICD-10-CM

## 2011-11-17 NOTE — Telephone Encounter (Signed)
Message copied by Scarlette Calico on Mon Nov 17, 2011  8:07 AM ------      Message from: [place], [place] D      Created: Thu Nov 13, 2011  4:21 PM       Hold Demadex for 2 days. Re-check BMET next week. Thank you Amy Ninfa Meeker

## 2011-11-20 ENCOUNTER — Other Ambulatory Visit (INDEPENDENT_AMBULATORY_CARE_PROVIDER_SITE_OTHER): Payer: Medicaid Other | Admitting: *Deleted

## 2011-11-20 DIAGNOSIS — I2789 Other specified pulmonary heart diseases: Secondary | ICD-10-CM

## 2011-11-20 LAB — BASIC METABOLIC PANEL
BUN: 60 mg/dL — ABNORMAL HIGH (ref 6–23)
CO2: 21 mEq/L (ref 19–32)
Calcium: 9.5 mg/dL (ref 8.4–10.5)
Chloride: 112 mEq/L (ref 96–112)
Creatinine, Ser: 2.9 mg/dL — ABNORMAL HIGH (ref 0.4–1.5)
GFR: 30.58 mL/min — ABNORMAL LOW (ref >60.00)
Glucose, Bld: 91 mg/dL (ref 70–99)
Potassium: 3.9 mEq/L (ref 3.5–5.1)
Sodium: 142 mEq/L (ref 135–145)

## 2011-11-20 IMAGING — CR DG CHEST 1V PORT
1 series · 1 of 1 positions shown · non-contrast
Comparison: [DATE]

CLINICAL DATA: PICC line placement

PORTABLE CHEST - 1 VIEW

[view not recorded]
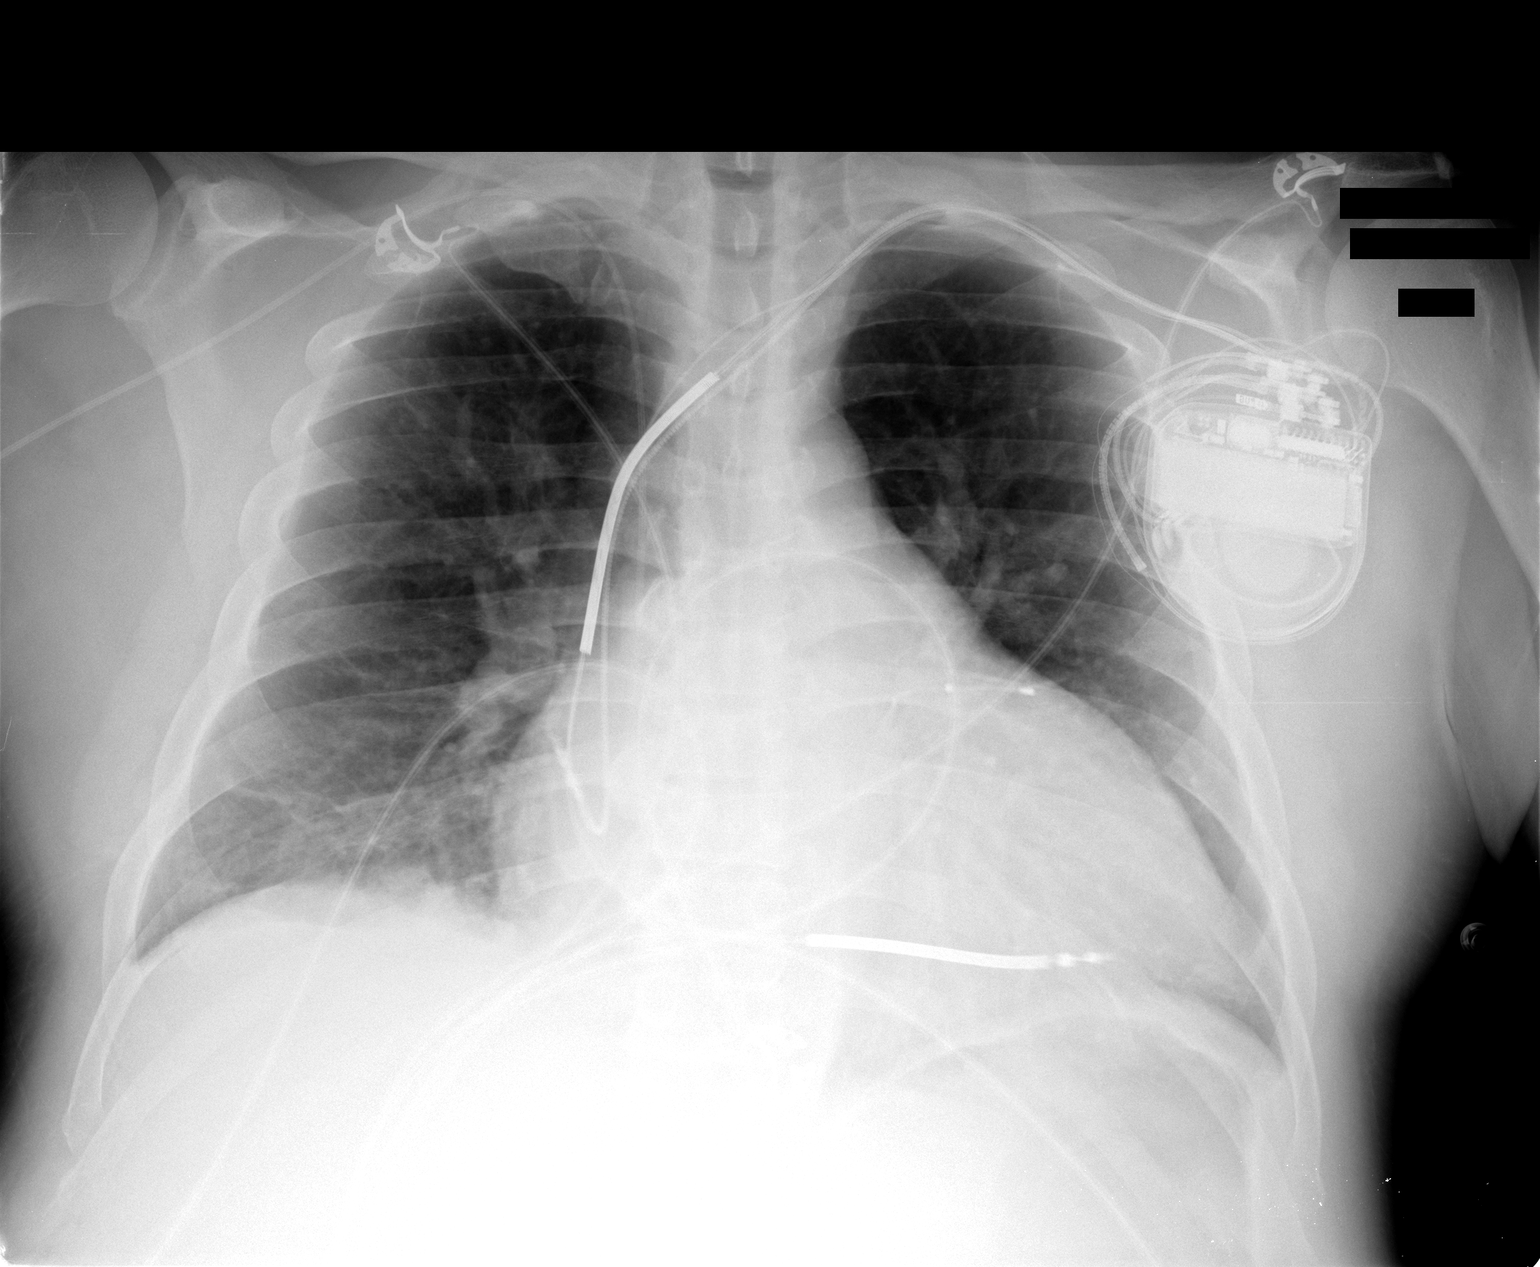

[1 of 1 positions shown; findings below may reference images not displayed]

FINDINGS: Right arm PICC has its tip just above the right atrium
within the SVC.  Pacemaker/AICD remains in place.  Cardiomegaly
persists.  Mild basilar atelectasis persists.  Upper lobes are
clear.
IMPRESSION: No significant change.  PICC tip in the SVC just above the right
atrium.  Mild basilar atelectasis.

## 2011-12-04 ENCOUNTER — Telehealth (HOSPITAL_COMMUNITY): Payer: Self-pay | Admitting: *Deleted

## 2011-12-04 MED ORDER — SILDENAFIL CITRATE 20 MG PO TABS: 60.0000 mg | ORAL_TABLET | Freq: Three times a day (TID) | ORAL | Status: DC

## 2011-12-04 NOTE — Telephone Encounter (Signed)
David Gregory called today. He needs his revatio to be called into the pharmacy.  He ran out 2 days ago and cannot wait til tomorrows appointment for it.  Thanks!

## 2011-12-04 NOTE — Telephone Encounter (Signed)
Pt aware rx was sent in 

## 2011-12-05 ENCOUNTER — Telehealth (HOSPITAL_COMMUNITY): Payer: Self-pay | Admitting: *Deleted

## 2011-12-05 ENCOUNTER — Ambulatory Visit (HOSPITAL_COMMUNITY)
Admission: RE | Admit: 2011-12-05 | Discharge: 2011-12-05 | Disposition: A | Discharge status: Home / Self Care | Payer: Medicaid Other | Source: Ambulatory Visit | Attending: Internal Medicine | Admitting: Internal Medicine

## 2011-12-05 VITALS — BP 130/80 | HR 100 | Wt 222.5 lb

## 2011-12-05 DIAGNOSIS — I5022 Chronic systolic (congestive) heart failure: Secondary | ICD-10-CM

## 2011-12-05 NOTE — Telephone Encounter (Signed)
Mr Provencher called this afternoon.  He says that he is a little confused about what medication he should be taking and would like to speak with someone about it.  Thanks.

## 2011-12-05 NOTE — Telephone Encounter (Signed)
Awaiting approval from insurance company for revatio.  Dawn has faxed information.  Pt aware.

## 2011-12-05 NOTE — Patient Instructions (Addendum)
Do the following things EVERYDAY: 1) Weigh yourself in the morning before breakfast. Write it down and keep it in a log. 2) Take your medicines as prescribed 3) Eat low salt foods-Limit salt (sodium) to 2000mg  per day.  4) Stay as active as you can everyday  Follow up in 6 weeks

## 2011-12-05 NOTE — Assessment & Plan Note (Addendum)
S/p OHT this year. Volume status improved. Continue Demadex 20 mg bid. He will follow up next week at [facility] for [place].  Re-educated to follow low salt diet. Will refer to Dr Halford Chessman for sleep study to rule out sleep apnea. Follow up in 6 weeks.   Patient seen and examined with Darrick Grinder, NP. We discussed all aspects of the encounter. I agree with the assessment and plan as stated above.Has been struggling with RHF. Volume status improved. Reinforced need for daily weights, salt restriction and reviewed use of sliding scale diuretics.

## 2011-12-05 NOTE — Progress Notes (Signed)
Patient ID: KEIF KIERAN, male   DOB: Jul 22, 1966, 46 y.o.   MRN: [id number] Patient ID: CARROLL BUMPUS, male   DOB: December 22, 1965, 46 y.o.   MRN: [id number] Patient ID: KASHDON DELMORE, male   DOB: 22-Apr-1966, 46 y.o.   MRN: [id number]     HPI:  David Gregory is a 46 year old male with h/o CHF due to nonischemic cardiomyopathy  EF 15-25% s/p BiVICD.  He also has a history of HTN, DM, CRI, previous tobacco and alcohol use (now abstinent since 12/10). Underwent OHTx in March 2012.   Post-op course c/b by RH failure with associated renal failure requiring an RVAD and TV ring annuloplasty. Has had mild rejection in June but all subsequent biopsies OK.   RHC at [facility] in May 2012 RA 12 PA 53/30 (36) PCW 13 Echo at [facility] 8/16: LVEF normal RV moderate HK. TV ring with mean gradient 7. No TR  Has been struggling with volume overload and predominantly RHF.  He was admitted on 9/4 for ultrafiltration.  He diuresed ~7 kg but with elevated Cr of 3.25 ultrafiltration was stopped and he was transitioned to oral demadex.  Revatio increased to 60 mg TID.    09/11/2011 [facility] RHC RA 27 RV 61/17 PA 61/23 (44) PCWP 23 V= 26 CI 3.5 PVR 2.8  10/02/2011 Creatinine 2.98  Potassium 4.0  10/17/2011 Creatinine 2.86 Potassium 4.3 11/04/2011   Creatinine 3.3 Potassium 3.9 12/27 Creatinine 2.9 Potassium 3.9  He returns today for follow up.  Instructed to hold Demadex for two daysDemadex held for 5 days due to increased Creatinine. He resumed Demadex at only 20 mg once a day, but he has increased Demadex 20 mg bid for the last 5 days. Weight at home 215.  Breathing better. SOB walking up steps. Denies CP/dizziness/ PND/ orthopnea. Able to sleep flat in bed. Good appetite. Tries to follow low salt diet but he liberalized diet over holidays. Abdomen remains full.  Limiting fluid intake. He will follow next week at [facility] for  [place] and biopsy. Dr Gilles Chiquito recommended sleep study .   ROS: All systems negative except as listed in HPI, PMH and  Problem List.  Past Medical History  Diagnosis Date  . VENTRICULAR TACHYCARDIA   . SYSTOLIC HEART FAILURE, CHRONIC   . SYSTOLIC HEART FAILURE, ACUTE ON CHRONIC   . CONGESTIVE HEART FAILURE UNSPECIFIED   . Gout, unspecified   . ERECTILE DYSFUNCTION, ORGANIC   . Edema   . Diarrhea   . CHEST PAIN UNSPECIFIED   . Carpal tunnel syndrome     Current Outpatient Prescriptions  Medication Sig Dispense Refill  . allopurinol (ZYLOPRIM) 100 MG tablet Take 100 mg by mouth daily.        Marland Kitchen aspirin 81 MG tablet Take 81 mg by mouth daily.        . Calcium Carbonate-Vitamin D (CALCIUM-VITAMIN D) 600-200 MG-UNIT CAPS Take 1 capsule by mouth 2 (two) times daily.        . carvedilol (COREG) 25 MG tablet Take 25 mg by mouth 2 (two) times daily with a meal.        . colchicine 0.6 MG tablet Take 0.6 mg by mouth daily as needed.        . insulin NPH-insulin regular (NOVOLIN 70/30) (70-30) 100 UNIT/ML injection Inject 78 Units into the skin daily with breakfast. 26 units in PM      . levothyroxine (SYNTHROID, LEVOTHROID) 25 MCG tablet Take 25 mcg by mouth daily.        Marland Kitchen  methotrexate (RHEUMATREX) 5 MG tablet Take 5 mg by mouth 2 (two) times a week. Caution: Chemotherapy. Protect from light. Takes on Mon and Tues      . metolazone (ZAROXOLYN) 2.5 MG tablet Take 1 tablet (2.5 mg total) by mouth as needed.      . Multiple Vitamin (MULTIVITAMIN) tablet Take 1 tablet by mouth daily.        . mycophenolate (CELLCEPT) 500 MG tablet Take 1,500 mg by mouth 2 (two) times daily.        . pantoprazole (PROTONIX) 40 MG tablet Take 40 mg by mouth daily.        . pravastatin (PRAVACHOL) 20 MG tablet Take 20 mg by mouth daily.        . predniSONE (DELTASONE) 5 MG tablet Take 10 mg by mouth daily.        . sildenafil (REVATIO) 20 MG tablet Take 3 tablets (60 mg total) by mouth 3 (three) times daily.  270 tablet  6  . tacrolimus (PROGRAF) 1 MG capsule Take 8 mg in AM and 7 mg in PM       . torsemide (DEMADEX) 20 MG tablet  Take 20 mg by mouth 2 (two) times daily.          PHYSICAL EXAM: Filed Vitals:   12/05/11 1145  BP: 130/80  Pulse: 100  Wt 222 (217)    General:  Well appearing. No resp difficulty HEENT: normal Neck: supple. JVP 6-7 hard to see ,  Carotids 2+ bilaterally; no bruits. No lymphadenopathy or thryomegaly appreciated. Cor: PMI normal. Regular rate & rhythm.  S3 Lungs: clear Abdomen: distended  Non-tender. No bruits or masses. Good bowel sounds. Extremities: no cyanosis, clubbing, rash. No lower extremity    Trace edema  Neuro: alert & orientedx3, cranial nerves grossly intact. Moves all 4 extremities w/o difficulty. Affect pleasant    ASSESSMENT & PLAN:

## 2011-12-08 ENCOUNTER — Telehealth (HOSPITAL_COMMUNITY): Payer: Self-pay | Admitting: *Deleted

## 2011-12-08 NOTE — Telephone Encounter (Signed)
Pt needed PA for his Revatio, called Medicaid at [phone] and got med approved for 1 year, Donovan and pt are both aware

## 2012-01-06 ENCOUNTER — Other Ambulatory Visit (HOSPITAL_COMMUNITY): Payer: Self-pay | Admitting: Orthopaedic Surgery

## 2012-01-07 ENCOUNTER — Encounter (HOSPITAL_COMMUNITY): Payer: Self-pay | Admitting: Pharmacy Technician

## 2012-01-09 ENCOUNTER — Encounter (HOSPITAL_COMMUNITY): Payer: Self-pay | Admitting: Vascular Surgery

## 2012-01-09 ENCOUNTER — Encounter (HOSPITAL_COMMUNITY): Payer: Self-pay

## 2012-01-09 ENCOUNTER — Encounter (HOSPITAL_COMMUNITY)
Admission: RE | Admit: 2012-01-09 | Discharge: 2012-01-09 | Disposition: A | Discharge status: Home / Self Care | Payer: Medicaid Other | Source: Ambulatory Visit | Attending: Orthopaedic Surgery | Admitting: Orthopaedic Surgery

## 2012-01-09 DIAGNOSIS — Z01812 Encounter for preprocedural laboratory examination: Secondary | ICD-10-CM | POA: Insufficient documentation

## 2012-01-09 DIAGNOSIS — Z01818 Encounter for other preprocedural examination: Secondary | ICD-10-CM | POA: Insufficient documentation

## 2012-01-09 HISTORY — DX: Encounter for other specified aftercare: Z51.89

## 2012-01-09 HISTORY — DX: Disorder of kidney and ureter, unspecified: N28.9

## 2012-01-09 HISTORY — DX: Essential (primary) hypertension: I10

## 2012-01-09 HISTORY — DX: Hypothyroidism, unspecified: E03.9

## 2012-01-09 HISTORY — DX: Reserved for inherently not codable concepts without codable children: IMO0001

## 2012-01-09 LAB — BASIC METABOLIC PANEL
BUN: 81 mg/dL — ABNORMAL HIGH (ref 6–23)
CO2: 19 mEq/L (ref 19–32)
Calcium: 9.7 mg/dL (ref 8.4–10.5)
Chloride: 104 mEq/L (ref 96–112)
Creatinine, Ser: 4.45 mg/dL — ABNORMAL HIGH (ref 0.50–1.35)
GFR calc Af Amer: 17 mL/min — ABNORMAL LOW (ref >90)
GFR calc non Af Amer: 15 mL/min — ABNORMAL LOW (ref >90)
Glucose, Bld: 213 mg/dL — ABNORMAL HIGH (ref 70–99)
Potassium: 3.8 mEq/L (ref 3.5–5.1)
Sodium: 137 mEq/L (ref 135–145)

## 2012-01-09 LAB — CBC
HCT: 34.8 % — ABNORMAL LOW (ref 39.0–52.0)
Hemoglobin: 11 g/dL — ABNORMAL LOW (ref 13.0–17.0)
MCH: 27.6 pg (ref 26.0–34.0)
MCHC: 31.6 g/dL (ref 30.0–36.0)
MCV: 87.2 fL (ref 78.0–100.0)
Platelets: 145 10*3/uL — ABNORMAL LOW (ref 150–400)
RBC: 3.99 MIL/uL — ABNORMAL LOW (ref 4.22–5.81)
RDW: 18.8 % — ABNORMAL HIGH (ref 11.5–15.5)
WBC: 5 10*3/uL (ref 4.0–10.5)

## 2012-01-09 LAB — SURGICAL PCR SCREEN
MRSA, PCR: NEGATIVE
Staphylococcus aureus: NEGATIVE

## 2012-01-09 NOTE — Anesthesia Preprocedure Evaluation (Signed)
Anesthesia Evaluation    Airway       Dental   Pulmonary  Pulmonary HTN  ?OSA, sleep study scheduled for 01/2012         Cardiovascular  Heart transplant 01/2011 Right HF, s/p TV ring annuloplasty   Neuro/Psych    GI/Hepatic   Endo/Other    Renal/GU CKD     Musculoskeletal   Abdominal   Peds  Hematology   Anesthesia Other Findings   Reproductive/Obstetrics                           Anesthesia Physical Anesthesia Plan Anesthesia Quick Evaluation

## 2012-01-09 NOTE — Progress Notes (Addendum)
Pt had a heart transplant in 2/12 at [facility]. Pt followed by Dr. Stann Mainland at [facility]. Pt reports cardiac cath 12/11/11. Referred pt to [place], [place] to see pt. Requested EKG, CXR, most recent Cardiac cath 12/11/11, cardiac notes, most recent stress and echo test from [facility].

## 2012-01-09 NOTE — Pre-Procedure Instructions (Signed)
20 David Gregory  01/09/2012   Your procedure is scheduled on:  February 19  Report to [place] at 1:25 PM.  Call this number if you have problems the morning of surgery: [phone]   Remember:   Do not eat food:After Midnight.  May have clear liquids: up to 4 Hours before arrival.  Clear liquids include soda, tea, black coffee, apple or grape juice, broth.  Take these medicines the morning of surgery with A SIP OF WATER: Coreg, Levothyroxine, Protonix, Prednisone, cellcept, sildenafil, prograf     Stop all vitamins today  Do not wear jewelry, make-up or nail polish.  Do not wear lotions, powders, or perfumes. You may wear deodorant.  Do not shave 48 hours prior to surgery.  Do not bring valuables to the hospital.  Contacts, dentures or bridgework may not be worn into surgery.  Leave suitcase in the car. After surgery it may be brought to your room.  For patients admitted to the hospital, checkout time is 11:00 AM the day of discharge.   Patients discharged the day of surgery will not be allowed to drive home.  Name and phone number of your driver: Thompson Grayer [phone]  Special Instructions: CHG Shower Use Special Wash: 1/2 bottle night before surgery and 1/2 bottle morning of surgery.   Please read over the following fact sheets that you were given: Pain Booklet, Coughing and Deep Breathing, MRSA Information and Surgical Site Infection Prevention

## 2012-01-09 NOTE — Consult Note (Addendum)
Anesthesia:  Patient is a 46 year old male scheduled for excision of a left middle finger mass on 01/13/12.  Currently it is posted to be done under [place].    His history is significant for orthotpic heart transplant on 02/22/11 at [facility]  (This was done due to non-ischemic CM with EF 15-20% s/p BiVICD).  His post-op course was complicated by right heart failure and associated renal failure requiring an RVAD and TR ring annuloplasty.  He required ultrafiltration in September and was diuresed ~ 7 kg.  Since his Cr was rising, ultrafiltration was stopped and he was started on Demadex.  He is on sildenafil for pulmonary HTN.  He also had mild rejection in June 2012, but all subsequent biopsies were okay.    Other history includes DM on insulin, hypothyroidism, systolic CHF, HTN, ED, former tobacco and ETOH abuse (quit 2010).  He is also going to be evaluated for potential OSA with a formal sleep study on March 7.  His [facility] Pulmonologist is Dr. Rich Reining.  Of note, his explanted heart showed evidence of Sarcoidosis.  He is also suppose to see a Nephrologist at [facility] in the near future.  He reports his baseline Cr is around 2.9, but was recently up to 4.23.    He is followed at [facility].  He has been seen by a [facility] Cardiologist (?Dr. Charlynn Grimes) and in [facility] by Dr. Glori Bickers both in January of this year.  Patient feels his CHF symptoms have stablizied.  He denies any significant weight changes or increasing SOB or edema.  He has not had any CP or known signs of rejection. His heart rate in PAT was documented at 114, but was at 98 during my exam.  Heart rate was RRR, lungs were clear, and there was no significant pre-tibial edema.  He is obese with a somewhat large neck.  His most recent [facility] records have been requested, but according to available records scanned into Epic, he had a cath in 08/2011 showing right atrial pressure of 27, RV pressure 61/17, PA pressure 61/23 with  mean of 44, pulmonary capillary wedge pressure of 23, cardiac output 7.2, cardiac index 3.57, pulmonary vascular resistance 2.9 wood U.    Echo in November 2012 showed normal/hyperdynamic LV systolic function, moderately enlarged and hypokinetic RV.  See below (Addendum) and attached copies for updated echo and RHC results.  His EKG on 06/23/11 showed ST at 103, possible LAE, right BBB, inferior T wave abnormality.  He reports a CXR within the last year done at [facility], which has been requested.  Labs noted.  BUN/Cr 81/4.45 (His reported Cr last month was 4.23).  Glucose 213.  H/H 11.0/34.8.  PLT 145.  I already reviewed his Cardiac and Renal history with Anesthesiologist Dr. Tobias Alexander.  As mentioned, he is to see a Nephrologist in the near future.  Dr. Tobias Alexander did not feel that this necessarily needed to be done preoperatively for this procedure. However, Anesthesia does request that Dr. Haroldine Laws provide pre-operative input.  I spoke with Sherri at Dr. Trevor Mace office, and they are already working on obtaining clearance.  Will follow-up with records as available.   Addendum: 01/12/12 1640  I have reviewed his most recent cardiac records from [place].  Since my above note, he has had another echo and RHC.    Echo on 12/25/11 showed normal LV systolic function with mild LVH, moderate RV systolic dysfunction, trivial AR/MR/TR, mild PR, evidence of TV ring.  Findings were not felt significantly changed from 10/02/11.  [place] on 12/25/11 showed a RA mean pressure of 16, RV 52/16, PA 52/25 with a mean of 33, PCW mean of 14.  His 2V CXR report from [place] on 04/17/11 was noted.   Dr. Haroldine Laws was notified of patient's planned procedure, however, he felt that since he was followed more closely by [facility] that they should be the one to ultimately clear him for surgery.  Sherri has contacted [facility] for clearance, but is yet to hear from them.  Patient's surgery is not scheduled until after 1500  tomorrow, so she will follow-up with [facility] first thing tomorrow morning (01/13/12).  She has been in communication with Mr. Decrescenzo as well today.

## 2012-01-12 ENCOUNTER — Telehealth (HOSPITAL_COMMUNITY): Payer: Self-pay | Admitting: *Deleted

## 2012-01-12 MED ORDER — CLINDAMYCIN PHOSPHATE 600 MG/50ML IV SOLN
600.0000 mg | INTRAVENOUS | Status: DC
  Filled 2012-01-12: qty 50

## 2012-01-12 NOTE — Telephone Encounter (Signed)
Advised clearance needs to come from his MD at [facility] who has been following him more closely, Barbera Setters will contact them

## 2012-01-12 NOTE — Telephone Encounter (Signed)
Sheri called today regarding David Gregory, he is scheduled for surgery tomorrow with Dr [name] and they need surgical clearance for him.  Please follow up

## 2012-01-13 ENCOUNTER — Ambulatory Visit (HOSPITAL_COMMUNITY): Admission: RE | Admit: 2012-01-13 | Payer: Medicaid Other | Source: Ambulatory Visit | Admitting: Orthopaedic Surgery

## 2012-01-13 ENCOUNTER — Encounter (HOSPITAL_COMMUNITY): Admission: RE | Payer: Self-pay | Source: Ambulatory Visit

## 2012-01-13 SURGERY — EXCISION MASS
Anesthesia: General | Laterality: Left

## 2012-01-22 ENCOUNTER — Telehealth (HOSPITAL_COMMUNITY): Payer: Self-pay | Admitting: Adult Health

## 2012-01-22 ENCOUNTER — Ambulatory Visit (HOSPITAL_COMMUNITY)
Admission: RE | Admit: 2012-01-22 | Discharge: 2012-01-22 | Disposition: A | Discharge status: Home / Self Care | Payer: Medicaid Other | Source: Ambulatory Visit | Attending: Internal Medicine | Admitting: Internal Medicine

## 2012-01-22 VITALS — BP 128/80 | HR 95 | Wt 217.5 lb

## 2012-01-22 DIAGNOSIS — E119 Type 2 diabetes mellitus without complications: Secondary | ICD-10-CM | POA: Insufficient documentation

## 2012-01-22 DIAGNOSIS — I129 Hypertensive chronic kidney disease with stage 1 through stage 4 chronic kidney disease, or unspecified chronic kidney disease: Secondary | ICD-10-CM | POA: Insufficient documentation

## 2012-01-22 DIAGNOSIS — Z79899 Other long term (current) drug therapy: Secondary | ICD-10-CM | POA: Insufficient documentation

## 2012-01-22 DIAGNOSIS — Z794 Long term (current) use of insulin: Secondary | ICD-10-CM | POA: Insufficient documentation

## 2012-01-22 DIAGNOSIS — E8779 Other fluid overload: Secondary | ICD-10-CM | POA: Insufficient documentation

## 2012-01-22 DIAGNOSIS — E039 Hypothyroidism, unspecified: Secondary | ICD-10-CM | POA: Insufficient documentation

## 2012-01-22 DIAGNOSIS — N189 Chronic kidney disease, unspecified: Secondary | ICD-10-CM | POA: Insufficient documentation

## 2012-01-22 DIAGNOSIS — Z941 Heart transplant status: Secondary | ICD-10-CM

## 2012-01-22 DIAGNOSIS — I5022 Chronic systolic (congestive) heart failure: Secondary | ICD-10-CM

## 2012-01-22 DIAGNOSIS — Z7982 Long term (current) use of aspirin: Secondary | ICD-10-CM | POA: Insufficient documentation

## 2012-01-22 DIAGNOSIS — I2789 Other specified pulmonary heart diseases: Secondary | ICD-10-CM

## 2012-01-22 DIAGNOSIS — I509 Heart failure, unspecified: Secondary | ICD-10-CM | POA: Insufficient documentation

## 2012-01-22 DIAGNOSIS — I428 Other cardiomyopathies: Secondary | ICD-10-CM | POA: Insufficient documentation

## 2012-01-22 LAB — BASIC METABOLIC PANEL
BUN: 81 mg/dL — ABNORMAL HIGH (ref 6–23)
CO2: 17 mEq/L — ABNORMAL LOW (ref 19–32)
Calcium: 10.3 mg/dL (ref 8.4–10.5)
Chloride: 108 mEq/L (ref 96–112)
Creatinine, Ser: 4.49 mg/dL — ABNORMAL HIGH (ref 0.50–1.35)
GFR calc Af Amer: 17 mL/min — ABNORMAL LOW (ref >90)
GFR calc non Af Amer: 14 mL/min — ABNORMAL LOW (ref >90)
Glucose, Bld: 126 mg/dL — ABNORMAL HIGH (ref 70–99)
Potassium: 3.7 mEq/L (ref 3.5–5.1)
Sodium: 142 mEq/L (ref 135–145)

## 2012-01-22 NOTE — Telephone Encounter (Signed)
Instructed to decrease Demadex 20 mg daily due to increased Creatinine. David Gregory verbalized understanding.

## 2012-01-22 NOTE — Progress Notes (Signed)
Patient ID: David Gregory, male   DOB: 01-19-1966, 46 y.o.   MRN: [id number]  HPI:  David Gregory is a 46 year old male with h/o CHF due to nonischemic cardiomyopathy  EF 15-25% s/p BiVICD.  He also has a history of HTN, DM, CRI, previous tobacco and alcohol use (now abstinent since 12/10). Underwent OHTx in March 2012.   Post-op course c/b by RH failure with associated renal failure requiring an RVAD and TV ring annuloplasty. Has had mild rejection in June but all subsequent biopsies OK.   RHC at [facility] in May 2012 RA 12 PA 53/30 (36) PCW 13 Echo at [facility] 8/16: LVEF normal RV moderate HK. TV ring with mean gradient 7. No TR  Has been struggling with volume overload and predominantly RHF.  He was admitted on 9/4 for ultrafiltration.  He diuresed ~7 kg but with elevated Cr of 3.25 ultrafiltration was stopped and he was transitioned to oral demadex.  Revatio increased to 60 mg TID.    09/11/2011 [facility] RHC RA 27 RV 61/17 PA 61/23 (44) PCWP 23 V= 26 CI 3.5 PVR 2.8  10/02/2011 Creatinine 2.98  Potassium 4.0  10/17/2011 Creatinine 2.86 Potassium 4.3 11/04/2011   Creatinine 3.3 Potassium 3.9 12/27 Creatinine 2.9 Potassium 3.9 2/15 Creatinine 4.45 BUN 81 Potassium 3.8  He returns today for follow up.  He is taking Demadex 20 mg twice daily. Denies SOB/PND/Orthopnea/ CP. Denies lower extremity edema. Weight at home 209. He will follow next week at [facility] for  [place] and biopsy. Dr Gilles Chiquito recommended sleep study . 3/5 Plan for R middle finger removal of cyst/ and nail. Plan for sleep study 3/7.  3/14  Evaluation nephrology at [facility].    ROS: All systems negative except as listed in HPI, PMH and Problem List.  Past Medical History  Diagnosis Date  . VENTRICULAR TACHYCARDIA   . SYSTOLIC HEART FAILURE, CHRONIC   . SYSTOLIC HEART FAILURE, ACUTE ON CHRONIC   . CONGESTIVE HEART FAILURE UNSPECIFIED   . Gout, unspecified   . ERECTILE DYSFUNCTION, ORGANIC   . Edema   . Diarrhea   . CHEST PAIN UNSPECIFIED   . Carpal  tunnel syndrome   . Hypertension   . Renal insufficiency     pt reports levels have been at 4.8  . Blood transfusion     with heart transplant  . Diabetes mellitus   . Hypothyroidism     Current Outpatient Prescriptions  Medication Sig Dispense Refill  . aspirin EC 81 MG tablet Take 81 mg by mouth daily.      . calcium-vitamin D (OSCAL WITH D) 500-200 MG-UNIT per tablet Take 1 tablet by mouth 2 (two) times daily.      . carvedilol (COREG) 25 MG tablet Take 25 mg by mouth 2 (two) times daily with a meal.        . colchicine 0.6 MG tablet Take 0.6 mg by mouth daily as needed. For gout      . insulin NPH-insulin regular (NOVOLIN 70/30) (70-30) 100 UNIT/ML injection Inject 26-78 Units into the skin See admin instructions. Inject 78 units in the morning and 26 units at night      . levothyroxine (SYNTHROID, LEVOTHROID) 25 MCG tablet Take 25 mcg by mouth daily.        . methotrexate (RHEUMATREX) 5 MG tablet Take 5 mg by mouth 2 (two) times a week. Takes on Mon and Tues      . metolazone (ZAROXOLYN) 2.5 MG tablet Take 2.5 mg by  mouth daily as needed. For fluid retention      . Multiple Vitamin (MULTIVITAMIN) tablet Take 1 tablet by mouth daily.        . mycophenolate (CELLCEPT) 500 MG tablet Take 1,500 mg by mouth 2 (two) times daily.        . pantoprazole (PROTONIX) 40 MG tablet Take 40 mg by mouth daily.        . pravastatin (PRAVACHOL) 20 MG tablet Take 20 mg by mouth daily.        . predniSONE (DELTASONE) 10 MG tablet Take 10 mg by mouth daily.      . sildenafil (REVATIO) 20 MG tablet Take 3 tablets (60 mg total) by mouth 3 (three) times daily.  270 tablet  6  . tacrolimus (PROGRAF) 1 MG capsule Take 6 mg by mouth 2 (two) times daily.       Marland Kitchen torsemide (DEMADEX) 20 MG tablet Take 20 mg by mouth 2 (two) times daily.          PHYSICAL EXAM: Filed Vitals:   01/22/12 0927  BP: 128/80  Pulse: 95  Wt 217 (222)    General:  Well appearing. No resp difficulty HEENT: normal Neck: supple.  JVP 6-7 hard to see ,  Carotids 2+ bilaterally; no bruits. No lymphadenopathy or thryomegaly appreciated. Cor: PMI normal. Regular rate & rhythm.  S3 Lungs: clear Abdomen: distended  Non-tender. No bruits or masses. Good bowel sounds. Extremities: no cyanosis, clubbing, rash. No lower extremity    Trace edema L middle finger Gouty nodule Neuro: alert & orientedx3, cranial nerves grossly intact. Moves all 4 extremities w/o difficulty. Affect pleasant    ASSESSMENT & PLAN:

## 2012-01-22 NOTE — Assessment & Plan Note (Addendum)
He is s/p OHTx. Post transplant course c/b RHF and renal insufficiency. Volume status looks very good but renal function continues to worsen. Creatinine 4.45 . Will continue Demadex. He will have repeat BMET and adjust Demadex if needed.  Follow up in two  months. He has f/u scheduled with Renal.   Patient seen and examined with Darrick Grinder, NP. We discussed all aspects of the encounter. I agree with the assessment and plan as stated above. Volume status looks great on exam but renal functioning worsening. Suspect it wont be long before he is on HD. Addendum: CR still 4.5 will decrease demadex to 20 bid.

## 2012-01-22 NOTE — Patient Instructions (Addendum)
    Repeat BMET today  Follow up in two months.

## 2012-01-23 ENCOUNTER — Encounter (HOSPITAL_COMMUNITY): Payer: Self-pay | Admitting: Pharmacy Technician

## 2012-01-23 LAB — TACROLIMUS LEVEL: Tacrolimus (FK506) - LabCorp: 8.7 ng/mL

## 2012-01-26 ENCOUNTER — Encounter (HOSPITAL_COMMUNITY): Payer: Self-pay | Admitting: *Deleted

## 2012-01-26 ENCOUNTER — Other Ambulatory Visit (HOSPITAL_COMMUNITY): Payer: Self-pay

## 2012-01-26 ENCOUNTER — Other Ambulatory Visit (HOSPITAL_COMMUNITY): Payer: Self-pay | Admitting: Orthopaedic Surgery

## 2012-01-26 ENCOUNTER — Other Ambulatory Visit (HOSPITAL_COMMUNITY): Payer: Self-pay | Admitting: *Deleted

## 2012-01-26 MED ORDER — CLINDAMYCIN PHOSPHATE 600 MG/50ML IV SOLN
600.0000 mg | INTRAVENOUS | Status: AC
  Administered 2012-01-27: 600 mg via INTRAVENOUS
  Filled 2012-01-26: qty 50

## 2012-01-27 ENCOUNTER — Encounter (HOSPITAL_COMMUNITY)
Admission: RE | Disposition: A | Discharge status: Home / Self Care | Payer: Self-pay | Source: Ambulatory Visit | Attending: Orthopaedic Surgery

## 2012-01-27 ENCOUNTER — Ambulatory Visit (HOSPITAL_COMMUNITY): Payer: Medicaid Other

## 2012-01-27 ENCOUNTER — Encounter (HOSPITAL_COMMUNITY): Payer: Self-pay | Admitting: Vascular Surgery

## 2012-01-27 ENCOUNTER — Ambulatory Visit (HOSPITAL_COMMUNITY): Payer: Medicaid Other | Admitting: Vascular Surgery

## 2012-01-27 ENCOUNTER — Ambulatory Visit (HOSPITAL_COMMUNITY)
Admission: RE | Admit: 2012-01-27 | Discharge: 2012-01-27 | Disposition: A | Discharge status: Home / Self Care | Payer: Medicaid Other | Source: Ambulatory Visit | Attending: Orthopaedic Surgery | Admitting: Orthopaedic Surgery

## 2012-01-27 ENCOUNTER — Encounter (HOSPITAL_COMMUNITY): Payer: Self-pay | Admitting: *Deleted

## 2012-01-27 DIAGNOSIS — E119 Type 2 diabetes mellitus without complications: Secondary | ICD-10-CM | POA: Insufficient documentation

## 2012-01-27 DIAGNOSIS — I509 Heart failure, unspecified: Secondary | ICD-10-CM | POA: Insufficient documentation

## 2012-01-27 DIAGNOSIS — M109 Gout, unspecified: Secondary | ICD-10-CM | POA: Insufficient documentation

## 2012-01-27 DIAGNOSIS — I10 Essential (primary) hypertension: Secondary | ICD-10-CM | POA: Insufficient documentation

## 2012-01-27 DIAGNOSIS — Z01812 Encounter for preprocedural laboratory examination: Secondary | ICD-10-CM | POA: Insufficient documentation

## 2012-01-27 DIAGNOSIS — I5022 Chronic systolic (congestive) heart failure: Secondary | ICD-10-CM | POA: Insufficient documentation

## 2012-01-27 DIAGNOSIS — G4730 Sleep apnea, unspecified: Secondary | ICD-10-CM | POA: Insufficient documentation

## 2012-01-27 DIAGNOSIS — R2232 Localized swelling, mass and lump, left upper limb: Secondary | ICD-10-CM

## 2012-01-27 HISTORY — DX: Unspecified osteoarthritis, unspecified site: M19.90

## 2012-01-27 HISTORY — PX: MASS EXCISION: SHX2000

## 2012-01-27 LAB — BASIC METABOLIC PANEL
BUN: 69 mg/dL — ABNORMAL HIGH (ref 6–23)
CO2: 18 mEq/L — ABNORMAL LOW (ref 19–32)
Calcium: 10 mg/dL (ref 8.4–10.5)
Chloride: 112 mEq/L (ref 96–112)
Creatinine, Ser: 3.5 mg/dL — ABNORMAL HIGH (ref 0.50–1.35)
GFR calc Af Amer: 23 mL/min — ABNORMAL LOW (ref >90)
GFR calc non Af Amer: 19 mL/min — ABNORMAL LOW (ref >90)
Glucose, Bld: 84 mg/dL (ref 70–99)
Potassium: 4.2 mEq/L (ref 3.5–5.1)
Sodium: 142 mEq/L (ref 135–145)

## 2012-01-27 LAB — CBC
HCT: 31.7 % — ABNORMAL LOW (ref 39.0–52.0)
Hemoglobin: 10.1 g/dL — ABNORMAL LOW (ref 13.0–17.0)
MCH: 28.2 pg (ref 26.0–34.0)
MCHC: 31.9 g/dL (ref 30.0–36.0)
MCV: 88.5 fL (ref 78.0–100.0)
Platelets: 140 10*3/uL — ABNORMAL LOW (ref 150–400)
RBC: 3.58 MIL/uL — ABNORMAL LOW (ref 4.22–5.81)
RDW: 17.5 % — ABNORMAL HIGH (ref 11.5–15.5)
WBC: 1.6 10*3/uL — ABNORMAL LOW (ref 4.0–10.5)

## 2012-01-27 LAB — GLUCOSE, CAPILLARY
Glucose-Capillary: 86 mg/dL (ref 70–99)
Glucose-Capillary: 56 mg/dL — ABNORMAL LOW (ref 70–99)
Glucose-Capillary: 52 mg/dL — ABNORMAL LOW (ref 70–99)
Glucose-Capillary: 54 mg/dL — ABNORMAL LOW (ref 70–99)
Glucose-Capillary: 56 mg/dL — ABNORMAL LOW (ref 70–99)
Glucose-Capillary: 98 mg/dL (ref 70–99)

## 2012-01-27 SURGERY — EXCISION MASS
Anesthesia: Monitor Anesthesia Care | Site: Finger | Laterality: Left | Wound class: Clean

## 2012-01-27 MED ORDER — ONDANSETRON HCL 4 MG/2ML IJ SOLN: 4.0000 mg | Freq: Once | INTRAMUSCULAR | Status: DC | PRN

## 2012-01-27 MED ORDER — DEXTROSE 50 % IV SOLN
INTRAVENOUS | Status: AC
  Administered 2012-01-27: 50 mL
  Filled 2012-01-27: qty 50

## 2012-01-27 MED ORDER — DEXTROSE 50 % IV SOLN
25.0000 mL | Freq: Once | INTRAVENOUS | Status: AC
  Administered 2012-01-27: 25 mL via INTRAVENOUS

## 2012-01-27 MED ORDER — LIDOCAINE HCL (PF) 1 % IJ SOLN
INTRAMUSCULAR | Status: DC | PRN
  Administered 2012-01-27: 7.5 mL

## 2012-01-27 MED ORDER — SODIUM CHLORIDE 0.9 % IV SOLN
INTRAVENOUS | Status: DC | PRN
  Administered 2012-01-27: 16:00:00 via INTRAVENOUS

## 2012-01-27 MED ORDER — MIDAZOLAM HCL 5 MG/5ML IJ SOLN
INTRAMUSCULAR | Status: DC | PRN
  Administered 2012-01-27 (×2): 1 mg via INTRAVENOUS

## 2012-01-27 MED ORDER — FLUMAZENIL 0.5 MG/5ML IV SOLN
INTRAVENOUS | Status: AC
  Filled 2012-01-27: qty 5

## 2012-01-27 MED ORDER — FENTANYL CITRATE 0.05 MG/ML IJ SOLN: 25.0000 ug | INTRAMUSCULAR | Status: DC | PRN

## 2012-01-27 MED ORDER — DEXTROSE 50 % IV SOLN
12.5000 g | INTRAVENOUS | Status: DC
  Administered 2012-01-27 (×2): 12.5 g via INTRAVENOUS

## 2012-01-27 MED ORDER — FENTANYL CITRATE 0.05 MG/ML IJ SOLN
INTRAMUSCULAR | Status: DC | PRN
  Administered 2012-01-27 (×2): 50 ug via INTRAVENOUS

## 2012-01-27 MED ORDER — OXYCODONE-ACETAMINOPHEN 5-325 MG PO TABS: 1.0000 | ORAL_TABLET | ORAL | Status: AC | PRN

## 2012-01-27 MED ORDER — DIPHENHYDRAMINE HCL 50 MG/ML IJ SOLN
INTRAMUSCULAR | Status: DC | PRN
  Administered 2012-01-27: 25 mg via INTRAVENOUS

## 2012-01-27 MED ORDER — BUPIVACAINE HCL 0.25 % IJ SOLN
INTRAMUSCULAR | Status: DC | PRN
  Administered 2012-01-27: 7.5 mL

## 2012-01-27 MED ORDER — MUPIROCIN 2 % EX OINT
TOPICAL_OINTMENT | CUTANEOUS | Status: AC
  Filled 2012-01-27: qty 22

## 2012-01-27 MED ORDER — PROPOFOL 10 MG/ML IV EMUL
INTRAVENOUS | Status: DC | PRN
  Administered 2012-01-27 (×3): 20 mg via INTRAVENOUS

## 2012-01-27 MED ORDER — SODIUM CHLORIDE 0.9 % IR SOLN
Status: DC | PRN
  Administered 2012-01-27: 1000 mL

## 2012-01-27 MED ORDER — ALBUTEROL SULFATE HFA 108 (90 BASE) MCG/ACT IN AERS
INHALATION_SPRAY | RESPIRATORY_TRACT | Status: DC | PRN
  Administered 2012-01-27: 2 via RESPIRATORY_TRACT

## 2012-01-27 MED ORDER — FLUMAZENIL 0.5 MG/5ML IV SOLN
0.3000 mg | Freq: Once | INTRAVENOUS | Status: AC
  Administered 2012-01-27: 0.3 mg via INTRAVENOUS

## 2012-01-27 MED ORDER — SODIUM CHLORIDE 0.9 % IV SOLN
INTRAVENOUS | Status: DC
  Administered 2012-01-27: 500 mL via INTRAVENOUS
  Administered 2012-01-27: 35 mL/h via INTRAVENOUS

## 2012-01-27 SURGICAL SUPPLY — 55 items
BANDAGE CONFORM 3  STR LF (GAUZE/BANDAGES/DRESSINGS) IMPLANT
BANDAGE ELASTIC 3 VELCRO ST LF (GAUZE/BANDAGES/DRESSINGS) IMPLANT
BANDAGE GAUZE 4  KLING STR (GAUZE/BANDAGES/DRESSINGS) ×1 IMPLANT
BLADE SURG 10 STRL SS (BLADE) ×2 IMPLANT
BNDG COHESIVE 1X5 TAN STRL LF (GAUZE/BANDAGES/DRESSINGS) ×1 IMPLANT
BNDG COHESIVE 4X5 TAN STRL (GAUZE/BANDAGES/DRESSINGS) ×1 IMPLANT
BNDG COHESIVE 6X5 TAN STRL LF (GAUZE/BANDAGES/DRESSINGS) ×2 IMPLANT
BNDG GAUZE STRTCH 6 (GAUZE/BANDAGES/DRESSINGS) ×3 IMPLANT
CLOTH BEACON ORANGE TIMEOUT ST (SAFETY) ×2 IMPLANT
CORDS BIPOLAR (ELECTRODE) ×1 IMPLANT
COVER SURGICAL LIGHT HANDLE (MISCELLANEOUS) ×2 IMPLANT
CUFF TOURNIQUET SINGLE 18IN (TOURNIQUET CUFF) ×1 IMPLANT
CUFF TOURNIQUET SINGLE 24IN (TOURNIQUET CUFF) IMPLANT
CUFF TOURNIQUET SINGLE 34IN LL (TOURNIQUET CUFF) IMPLANT
CUFF TOURNIQUET SINGLE 44IN (TOURNIQUET CUFF) IMPLANT
DRAPE ORTHO SPLIT 77X108 STRL (DRAPES) ×4
DRAPE SURG 17X23 STRL (DRAPES) ×1 IMPLANT
DRAPE SURG ORHT 6 SPLT 77X108 (DRAPES) ×2 IMPLANT
DRAPE U-SHAPE 47X51 STRL (DRAPES) ×1 IMPLANT
DURAPREP 26ML APPLICATOR (WOUND CARE) ×2 IMPLANT
ELECT CAUTERY BLADE 6.4 (BLADE) IMPLANT
ELECT REM PT RETURN 9FT ADLT (ELECTROSURGICAL)
ELECTRODE REM PT RTRN 9FT ADLT (ELECTROSURGICAL) IMPLANT
GAUZE XEROFORM 1X8 LF (GAUZE/BANDAGES/DRESSINGS) ×2 IMPLANT
GLOVE BIOGEL PI IND STRL 8 (GLOVE) ×1 IMPLANT
GLOVE BIOGEL PI INDICATOR 8 (GLOVE) ×1
GLOVE ORTHO TXT STRL SZ7.5 (GLOVE) ×2 IMPLANT
GOWN PREVENTION PLUS LG XLONG (DISPOSABLE) IMPLANT
GOWN PREVENTION PLUS XLARGE (GOWN DISPOSABLE) ×2 IMPLANT
GOWN STRL NON-REIN LRG LVL3 (GOWN DISPOSABLE) ×2 IMPLANT
HANDPIECE INTERPULSE COAX TIP (DISPOSABLE)
KIT BASIN OR (CUSTOM PROCEDURE TRAY) ×2 IMPLANT
KIT ROOM TURNOVER OR (KITS) ×2 IMPLANT
MANIFOLD NEPTUNE II (INSTRUMENTS) ×2 IMPLANT
NS IRRIG 1000ML POUR BTL (IV SOLUTION) ×2 IMPLANT
PACK ORTHO EXTREMITY (CUSTOM PROCEDURE TRAY) ×2 IMPLANT
PAD ARMBOARD 7.5X6 YLW CONV (MISCELLANEOUS) ×2 IMPLANT
PADDING CAST ABS 4INX4YD NS (CAST SUPPLIES) ×2
PADDING CAST ABS COTTON 4X4 ST (CAST SUPPLIES) ×2 IMPLANT
PADDING CAST COTTON 6X4 STRL (CAST SUPPLIES) ×2 IMPLANT
SET HNDPC FAN SPRY TIP SCT (DISPOSABLE) IMPLANT
SPONGE GAUZE 4X4 12PLY (GAUZE/BANDAGES/DRESSINGS) ×2 IMPLANT
SPONGE LAP 18X18 X RAY DECT (DISPOSABLE) ×2 IMPLANT
STOCKINETTE IMPERVIOUS 9X36 MD (GAUZE/BANDAGES/DRESSINGS) ×2 IMPLANT
SUT ETHILON 2 0 FS 18 (SUTURE) ×4 IMPLANT
SUT ETHILON 3 0 PS 1 (SUTURE) ×2 IMPLANT
SYR CONTROL 10ML LL (SYRINGE) ×1 IMPLANT
TOWEL OR 17X24 6PK STRL BLUE (TOWEL DISPOSABLE) ×2 IMPLANT
TOWEL OR 17X26 10 PK STRL BLUE (TOWEL DISPOSABLE) ×2 IMPLANT
TUBE ANAEROBIC SPECIMEN COL (MISCELLANEOUS) IMPLANT
TUBE CONNECTING 12X1/4 (SUCTIONS) ×2 IMPLANT
TUBE FEEDING 5FR 15 INCH (TUBING) IMPLANT
UNDERPAD 30X30 INCONTINENT (UNDERPADS AND DIAPERS) ×2 IMPLANT
WATER STERILE IRR 1000ML POUR (IV SOLUTION) ×2 IMPLANT
YANKAUER SUCT BULB TIP NO VENT (SUCTIONS) ×2 IMPLANT

## 2012-01-27 NOTE — Discharge Instructions (Signed)
Leave current dressing in place over your left middle finger for the next 3-4 days. After then, you may remove your dressing and place neosporin and a band-aid over the wound daily. You can get the finger and incision wet in 4 days. Call [employer] [phone] with questions/concerns. Call the office for a follow-up appointment in 10-12 days.

## 2012-01-27 NOTE — Anesthesia Preprocedure Evaluation (Addendum)
Anesthesia Evaluation  Patient identified by MRN, date of birth, ID band Patient awake    Reviewed: Allergy & Precautions, H&P , NPO status , Patient's Chart, lab work & pertinent test results, reviewed documented beta blocker date and time   Airway Mallampati: III TM Distance: >3 FB Neck ROM: Full    Dental  (+) Teeth Intact and Dental Advisory Given   Pulmonary sleep apnea ,    Pulmonary exam normal       Cardiovascular hypertension, Pt. on medications and Pt. on home beta blockers  S/p cardiac transplant 2012   Neuro/Psych    GI/Hepatic   Endo/Other  Diabetes mellitus-, Well Controlled, Type 2, Insulin DependentHypothyroidism Blood sugar 57 in holding treated with 1/2 amp D50  Renal/GU      Musculoskeletal   Abdominal Normal abdominal exam  (+)   Peds  Hematology   Anesthesia Other Findings   Reproductive/Obstetrics                         Anesthesia Physical Anesthesia Plan  ASA: III  Anesthesia Plan: MAC   Post-op Pain Management:    Induction: Intravenous  Airway Management Planned: Mask  Additional Equipment:   Intra-op Plan:   Post-operative Plan:   Informed Consent: I have reviewed the patients History and Physical, chart, labs and discussed the procedure including the risks, benefits and alternatives for the proposed anesthesia with the patient or authorized representative who has indicated his/her understanding and acceptance.   Dental advisory given  Plan Discussed with:   Anesthesia Plan Comments: (S/P Heart Transplant 1/12 at [facility] R. Heart failure requiring CVVH Renal insufficieny cr- 3.5 Type 2 DM glucose 56 1/2 amp D50 given Htn  Plan MAC with digital block  Roberts Gaudy, MD )     Anesthesia Quick Evaluation

## 2012-01-27 NOTE — Anesthesia Postprocedure Evaluation (Signed)
  Anesthesia Post-op Note  Patient: David Gregory  Procedure(s) Performed: Procedure(s) (LRB): EXCISION MASS (Left)  Patient Location: PACU  Anesthesia Type: MAC  Level of Consciousness: awake, alert  and oriented  Airway and Oxygen Therapy: Patient Spontanous Breathing  Post-op Pain: mild  Post-op Assessment: Post-op Vital signs reviewed and Patient's Cardiovascular Status Stable  Post-op Vital Signs: stable  Complications: No apparent anesthesia complications

## 2012-01-27 NOTE — Preoperative (Signed)
Beta Blockers   Reason not to administer Beta Blockers:Not Applicable 

## 2012-01-27 NOTE — H&P (Signed)
David Gregory is an 46 Gregory.o. male.   Chief Complaint:   Draining left middle finger mass consistent with gout HPI:   46 yo male with multiple medical problems including severe heart issues.  Has had a draining area on the dorsum of his left hand middle finger that has not responded to oral antibiotics or soaks.  ? Infection vs gout.  Past Medical History  Diagnosis Date  . VENTRICULAR TACHYCARDIA   . SYSTOLIC HEART FAILURE, CHRONIC   . SYSTOLIC HEART FAILURE, ACUTE ON CHRONIC   . CONGESTIVE HEART FAILURE UNSPECIFIED   . Gout, unspecified   . ERECTILE DYSFUNCTION, ORGANIC   . Edema   . Diarrhea   . CHEST PAIN UNSPECIFIED   . Hypertension   . Renal insufficiency     pt reports levels have been at 4.8  . Blood transfusion     with heart transplant  . Diabetes mellitus   . Hypothyroidism   . Carpal tunnel syndrome     Right  . Sleep apnea     sleep study  . Arthritis     "Every where"    Past Surgical History  Procedure Date  . Heart transplant feb/ 2012    Done at [facility] followed by Dr. Stann Mainland  . Fracture surgery     bilateral arms and ankle fixed  . Eye surgery     "styl removed"    Family History  Problem Relation Age of Onset  . Diabetes    . Hypertension    . Anesthesia problems Neg Hx    Social History:  reports that he quit smoking about 3 years ago. He does not have any smokeless tobacco history on file. He reports that he does not drink alcohol or use illicit drugs.  Allergies:  Allergies  Allergen Reactions  . Prednisone Rash    Medications Prior to Admission  Medication Dose Route Frequency Provider Last Rate Last Dose  . clindamycin (CLEOCIN) IVPB 600 mg  600 mg Intravenous 60 min Pre-Op Mcarthur Rossetti, MD      . mupirocin ointment (BACTROBAN) 2 %            Medications Prior to Admission  Medication Sig Dispense Refill  . aspirin EC 81 MG tablet Take 81 mg by mouth daily.      . calcium-vitamin D (OSCAL WITH D) 500-200 MG-UNIT per tablet  Take 1 tablet by mouth 2 (two) times daily.      . carvedilol (COREG) 25 MG tablet Take 25 mg by mouth 2 (two) times daily with a meal.       . colchicine 0.6 MG tablet Take 0.6 mg by mouth daily as needed. For gout      . insulin NPH-insulin regular (NOVOLIN 70/30) (70-30) 100 UNIT/ML injection Inject 26-78 Units into the skin See admin instructions. Inject 78 units in the morning and 26 units at night      . levothyroxine (SYNTHROID, LEVOTHROID) 25 MCG tablet Take 25 mcg by mouth daily.        . methotrexate (RHEUMATREX) 5 MG tablet Take 5 mg by mouth 2 (two) times a week. Takes on Mon and Tues      . metolazone (ZAROXOLYN) 2.5 MG tablet Take 2.5 mg by mouth daily as needed. For fluid retention      . Multiple Vitamin (MULTIVITAMIN) tablet Take 1 tablet by mouth daily.        . mycophenolate (CELLCEPT) 500 MG tablet Take 1,500 mg by  mouth 2 (two) times daily.       . pantoprazole (PROTONIX) 40 MG tablet Take 40 mg by mouth daily.        . pravastatin (PRAVACHOL) 20 MG tablet Take 20 mg by mouth daily.        . predniSONE (DELTASONE) 10 MG tablet Take 10 mg by mouth daily.      . sildenafil (REVATIO) 20 MG tablet Take 3 tablets (60 mg total) by mouth 3 (three) times daily.  270 tablet  6  . tacrolimus (PROGRAF) 1 MG capsule Take 6 mg by mouth 2 (two) times daily.       Marland Kitchen torsemide (DEMADEX) 20 MG tablet Take 20 mg by mouth 2 (two) times daily.         Results for orders placed during the hospital encounter of 01/27/12 (from the past 48 hour(s))  GLUCOSE, CAPILLARY     Status: Normal   Collection Time   01/27/12 12:43 PM      Component Value Range Comment   Glucose-Capillary 86  70 - 99 (mg/dL)    No results found.  Review of Systems  All other systems reviewed and are negative.    Blood pressure 117/84, pulse 99, temperature 98.4 F (36.9 C), temperature source Oral, resp. rate 18, height 5\' 4"  (1.626 m), weight 95.255 kg (210 lb), SpO2 97.00%. Physical Exam  Constitutional: He is  oriented to person, place, and time. He appears well-developed and well-nourished.  HENT:  Head: Normocephalic.  Eyes: Pupils are equal, round, and reactive to light.  Neck: Normal range of motion.  Cardiovascular: Normal rate.   Respiratory: Effort normal and breath sounds normal.  GI: Soft. Bowel sounds are normal.  Musculoskeletal:       Arms:      Left hand: He exhibits deformity.  Neurological: He is alert and oriented to person, place, and time.  Skin: Skin is warm and dry.  Psychiatric: He has a normal mood and affect.     Assessment/Plan To the OR for I&D with excision of his left middle finger lesion.  David Gregory 01/27/2012, 1:04 PM

## 2012-01-27 NOTE — Brief Op Note (Signed)
01/27/2012  4:39 PM  PATIENT:  David Gregory  46 y.o. male  PRE-OPERATIVE DIAGNOSIS:  left middle finger mass  POST-OPERATIVE DIAGNOSIS:  left middle finger mass  PROCEDURE:  Procedure(s) (LRB): EXCISION MASS (Left)  SURGEON:  Surgeon(s) and Role:    * Mcarthur Rossetti, MD - Primary  PHYSICIAN ASSISTANT:   ASSISTANTS: none   ANESTHESIA:   local and IV sedation  EBL:     BLOOD ADMINISTERED:none  DRAINS: none   LOCAL MEDICATIONS USED:  MARCAINE     SPECIMEN:  No Specimen  DISPOSITION OF SPECIMEN:  N/A  COUNTS:  YES  TOURNIQUET:   Total Tourniquet Time Documented: area (Left) - 16 minutes  DICTATION: .Other Dictation: Dictation Number [id number]  PLAN OF CARE: Discharge to home after PACU  PATIENT DISPOSITION:  PACU - hemodynamically stable.   Delay start of Pharmacological VTE agent (>24hrs) due to surgical blood loss or risk of bleeding: not applicable

## 2012-01-27 NOTE — Transfer of Care (Signed)
Immediate Anesthesia Transfer of Care Note  Patient: David Gregory  Procedure(s) Performed: Procedure(s) (LRB): EXCISION MASS (Left)  Patient Location: PACU  Anesthesia Type: MAC  Level of Consciousness: lethargic and responds to stimulation  Airway & Oxygen Therapy: Patient Spontanous Breathing and Patient connected to face mask oxygen  Post-op Assessment: Report given to PACU RN and Post -op Vital signs reviewed and stable  Post vital signs: Reviewed and stable  Complications: No apparent anesthesia complications

## 2012-01-28 ENCOUNTER — Encounter (HOSPITAL_COMMUNITY): Payer: Self-pay | Admitting: Orthopaedic Surgery

## 2012-01-28 ENCOUNTER — Institutional Professional Consult (permissible substitution): Payer: Medicaid Other | Admitting: Pulmonary Disease

## 2012-01-28 NOTE — Op Note (Signed)
David Gregory, TRETTEL NO.:  [id number]  MEDICAL RECORD NO.:  [id number]  LOCATION:  [facility]                         FACILITY:  [facility]  PHYSICIAN:  Lind Guest. Ninfa Linden, M.D.DATE OF BIRTH:  [birth year]  DATE OF PROCEDURE:  01/27/2012 DATE OF DISCHARGE:  01/27/2012                              OPERATIVE REPORT   PREOPERATIVE DIAGNOSIS:  Left third finger dorsal mass consistent with gouty tophi with erosive gout.  POSTOPERATIVE DIAGNOSIS:  Left third finger dorsal mass consistent with gouty tophi with erosive gout.  PROCEDURE:  Irrigation and debridement and excision of left middle finger mass.  SURGEON:  Lind Guest. Ninfa Linden, MD  ANESTHESIA: 1. Mask ventilation and IV sedation. 2. Local digital block with 0.25% plain Marcaine mixed with 1% plain     lidocaine.  BLOOD LOSS:  Minimal.  COMPLICATIONS:  None.  INDICATIONS:  Mr. Kari is a 46 year old gentleman who is in poor health with history of heart disease and transplant.  He has had also a terrible gout and had developed draining tophaceous gouty wound over his middle finger at the dorsal aspect near the DIP joint and the nail itself.  The head definitely find this consistent with gout, but the wound kept draining with minimal antibiotics even though it was not infected and with soaks.  He wishes to have this excised.  He understands that this is a tough surgery for someone even with it being a minimal surgery because of his heart disease.  PROCEDURE DESCRIPTION:  After informed consent was obtained, appropriate left middle finger was marked.  He was brought to the operating room and placed supine on the operating table with the left arm on armboard. Mask ventilation was obtained.  We then prepped his left hand with DuraPrep and sterile drapes.  Time-out was called and he was identified as correct patient, correct left hand and left middle finger.  I then used the mixture of 0.25% plain Marcaine  and 1% lidocaine and performed the digital block.  I then used a curette to curette gouty material all around the proximal aspect of his nail.  I did go down to the joint.  I made an incision over this as well to excise the gouty mass/tophi in its entirety.  I then copiously irrigated the wound with normal saline solution, dried and reapproximate the skin to get a good closure including the nail with 3-0 nylon suture.  Xeroform followed by well-padded sterile dressing was applied.  He was taken to the recovery room in stable condition.  All final counts were correct.  There were no complications noted.     Lind Guest. Ninfa Linden, M.D.     CYB/MEDQ  D:  01/27/2012  T:  01/28/2012  Job:  [id number]

## 2012-02-12 ENCOUNTER — Telehealth (HOSPITAL_COMMUNITY): Payer: Self-pay | Admitting: *Deleted

## 2012-02-12 NOTE — Telephone Encounter (Signed)
Pt was concerned b/c he was put on Keflex 500 mg bid for a rash, then he had surgery on his finger and that MD put him on doxycycline, he wasn't sure if he should take both or not, discussed w/Sally Putt, pharm D she recommends that pt stop Keflex for now and take the doxy he is aware and agreeable

## 2012-02-12 NOTE — Telephone Encounter (Signed)
Please call patient, he had operation and they gave him antibotics and he was already taking another antibotics, pt wants to make sure it is ok to take both antibotics.

## 2012-02-19 ENCOUNTER — Institutional Professional Consult (permissible substitution): Payer: Medicaid Other | Admitting: Pulmonary Disease

## 2012-03-02 ENCOUNTER — Institutional Professional Consult (permissible substitution): Payer: Medicaid Other | Admitting: Pulmonary Disease

## 2012-03-23 ENCOUNTER — Encounter (HOSPITAL_COMMUNITY): Payer: Medicaid Other

## 2012-03-29 ENCOUNTER — Encounter: Payer: Self-pay | Admitting: Pulmonary Disease

## 2012-03-29 ENCOUNTER — Ambulatory Visit (INDEPENDENT_AMBULATORY_CARE_PROVIDER_SITE_OTHER): Payer: Medicare Other | Admitting: Pulmonary Disease

## 2012-03-29 VITALS — BP 160/100 | HR 106 | Temp 98.7°F | Ht 64.0 in | Wt 229.0 lb

## 2012-03-29 DIAGNOSIS — R0989 Other specified symptoms and signs involving the circulatory and respiratory systems: Secondary | ICD-10-CM

## 2012-03-29 DIAGNOSIS — G4733 Obstructive sleep apnea (adult) (pediatric): Secondary | ICD-10-CM | POA: Insufficient documentation

## 2012-03-29 DIAGNOSIS — R0609 Other forms of dyspnea: Secondary | ICD-10-CM

## 2012-03-29 DIAGNOSIS — R0683 Snoring: Secondary | ICD-10-CM

## 2012-03-29 NOTE — Patient Instructions (Signed)
Will schedule sleep study Will call to schedule follow up after sleep study reviewed 

## 2012-03-29 NOTE — Assessment & Plan Note (Signed)
He has snoring, sleep disruption and daytime sleepiness.  He has a history of congestive heart failure s/p heart transplant, and diabetes.  I am concerned he could have sleep disordered breathing.  I have explained how sleep apnea can affect the patient's health.  Driving precautions and importance of weight loss were discussed.  Treatment options for sleep apnea were reviewed.  To further assess will need in lab sleep study.

## 2012-03-29 NOTE — Progress Notes (Deleted)
  Subjective:    Patient ID: David Gregory, male    DOB: [birth year], 46 y.o.   MRN: [id number]  HPI    Review of Systems  Constitutional: Positive for unexpected weight change. Negative for fever and appetite change.  HENT: Negative for ear pain, congestion, sore throat, sneezing, trouble swallowing and dental problem.   Respiratory: Positive for shortness of breath. Negative for cough.   Cardiovascular: Positive for leg swelling. Negative for chest pain and palpitations.  Gastrointestinal: Negative for abdominal pain.  Musculoskeletal: Positive for joint swelling and arthralgias.  Skin: Positive for rash.  Neurological: Negative for headaches.  Psychiatric/Behavioral: Negative for dysphoric mood. The patient is not nervous/anxious.        Objective:   Physical Exam        Assessment & Plan:

## 2012-03-29 NOTE — Progress Notes (Signed)
Chief Complaint  Patient presents with  . Advice Only    possible snoring    History of Present Illness: David Gregory is a 46 y.o. male for evaluation of snoring.  He has a history of congestive heart failure.  He had a heart transplantation at [facility].  He was advised by his cardiologist there that he could have sleep apnea.  He was then followed up with Dr. Haroldine Laws, and referred to pulmonary for further evaluation of his snoring.  He goes to bed at 10 pm.  He falls asleep quickly, and does not use anything to help him sleep.  He wakes up several times to use the bathroom.  He wake time varies, depending on his schedule.  He does not use anything to help him stay awake.  He does get sleepy during the day.  He can fall asleep easily when sitting quiet.  He denies morning headache.  He does snore, but is not sure if he stops breathing while asleep.  His mouth does get dry at night.  The patient denies sleep walking, sleep talking, bruxism, or nightmares.  There is no history of restless legs.  The patient denies sleep hallucinations, sleep paralysis, or cataplexy.  His Epworth score is 12 out of 24.  Past Medical History  Diagnosis Date  . VENTRICULAR TACHYCARDIA   . SYSTOLIC HEART FAILURE, CHRONIC   . SYSTOLIC HEART FAILURE, ACUTE ON CHRONIC   . CONGESTIVE HEART FAILURE UNSPECIFIED   . Gout, unspecified   . ERECTILE DYSFUNCTION, ORGANIC   . Edema   . Diarrhea   . CHEST PAIN UNSPECIFIED   . Hypertension   . Renal insufficiency     pt reports levels have been at 4.8  . Blood transfusion     with heart transplant  . Diabetes mellitus   . Hypothyroidism   . Carpal tunnel syndrome     Right  . Arthritis     "Every where"    Past Surgical History  Procedure Date  . Heart transplant feb/ 2012    Done at [facility] followed by Dr. Stann Mainland  . Fracture surgery     bilateral arms and l ankle fixed  . Eye surgery     "styl removed"  . Mass excision 01/27/2012    Procedure: EXCISION  MASS;  Surgeon: Mcarthur Rossetti, MD;  Location: [facility];  Service: Orthopedics;  Laterality: Left;  Excision left middle finger mass    Current Outpatient Prescriptions on File Prior to Visit  Medication Sig Dispense Refill  . aspirin EC 81 MG tablet Take 81 mg by mouth daily.      . calcium-vitamin D (OSCAL WITH D) 500-200 MG-UNIT per tablet Take 1 tablet by mouth 2 (two) times daily.      . carvedilol (COREG) 25 MG tablet Take 25 mg by mouth 2 (two) times daily with a meal.       . colchicine 0.6 MG tablet Take 0.6 mg by mouth daily as needed. For gout      . insulin NPH-insulin regular (NOVOLIN 70/30) (70-30) 100 UNIT/ML injection Inject 26-78 Units into the skin See admin instructions. Inject 78 units in the morning and 26 units at night      . levothyroxine (SYNTHROID, LEVOTHROID) 25 MCG tablet Take 25 mcg by mouth daily.        . metolazone (ZAROXOLYN) 2.5 MG tablet Take 2.5 mg by mouth daily as needed. For fluid retention      . Multiple  Vitamin (MULTIVITAMIN) tablet Take 1 tablet by mouth daily.        . mycophenolate (CELLCEPT) 500 MG tablet Take 1,500 mg by mouth 2 (two) times daily.       . pantoprazole (PROTONIX) 40 MG tablet Take 40 mg by mouth daily.        . pravastatin (PRAVACHOL) 20 MG tablet Take 20 mg by mouth daily.        . predniSONE (DELTASONE) 10 MG tablet Take 10 mg by mouth daily.      . sildenafil (REVATIO) 20 MG tablet Take 3 tablets (60 mg total) by mouth 3 (three) times daily.  270 tablet  6  . tacrolimus (PROGRAF) 1 MG capsule Take 6 mg by mouth 2 (two) times daily.       Marland Kitchen torsemide (DEMADEX) 20 MG tablet Take 20 mg by mouth 2 (two) times daily.         Allergies  Allergen Reactions  . Clindamycin Hcl     Wheezing and erythema/itching at IV site 01/27/12  . Prednisone Rash    family history includes Diabetes in his father and mother and Heart disease in his father.  There is no history of Anesthesia problems.   reports that he quit smoking about 2  years ago. He does not have any smokeless tobacco history on file. He reports that he does not drink alcohol or use illicit drugs.  Review of Systems   Constitutional: Positive for unexpected weight change. Negative for fever and appetite change.  HENT: Negative for ear pain, congestion, sore throat, sneezing, trouble swallowing and dental problem.   Respiratory: Positive for shortness of breath. Negative for cough.   Cardiovascular: Positive for leg swelling. Negative for chest pain and palpitations.  Gastrointestinal: Negative for abdominal pain.  Musculoskeletal: Positive for joint swelling and arthralgias.  Skin: Positive for rash.  Neurological: Negative for headaches.  Psychiatric/Behavioral: Negative for dysphoric mood. The patient is not nervous/anxious.     Physical Exam: BP 160/100  Pulse 106  Temp(Src) 98.7 F (37.1 C) (Oral)  Ht 5\' 4"  (1.626 m)  Wt 229 lb (103.874 kg)  BMI 39.31 kg/m2  SpO2 95%  Body mass index is 39.31 kg/(m^2).   General - No distress HEENT - Wears glasses, PERRLA, EOMI, no sinus tenderness, MP 4, no oral exudate, no LAN Cardiac - s1s2 regular, tachycardic Chest - good air entry, no wheeze/rales Abdomen - obese, soft, nontender Extremities - 1+ ankle edema Neurologic - normal strength, CN intact Skin - no rashes Psychiatric - normal mood, behavior  Assessment/Plan:  Outpatient Encounter Prescriptions as of 03/29/2012  Medication Sig Dispense Refill  . aspirin EC 81 MG tablet Take 81 mg by mouth daily.      . calcium-vitamin D (OSCAL WITH D) 500-200 MG-UNIT per tablet Take 1 tablet by mouth 2 (two) times daily.      . carvedilol (COREG) 25 MG tablet Take 25 mg by mouth 2 (two) times daily with a meal.       . colchicine 0.6 MG tablet Take 0.6 mg by mouth daily as needed. For gout      . insulin NPH-insulin regular (NOVOLIN 70/30) (70-30) 100 UNIT/ML injection Inject 26-78 Units into the skin See admin instructions. Inject 78 units in the  morning and 26 units at night      . levothyroxine (SYNTHROID, LEVOTHROID) 25 MCG tablet Take 25 mcg by mouth daily.        . metolazone (ZAROXOLYN) 2.5 MG tablet  Take 2.5 mg by mouth daily as needed. For fluid retention      . Multiple Vitamin (MULTIVITAMIN) tablet Take 1 tablet by mouth daily.        . mycophenolate (CELLCEPT) 500 MG tablet Take 1,500 mg by mouth 2 (two) times daily.       . pantoprazole (PROTONIX) 40 MG tablet Take 40 mg by mouth daily.        . pravastatin (PRAVACHOL) 20 MG tablet Take 20 mg by mouth daily.        . predniSONE (DELTASONE) 10 MG tablet Take 10 mg by mouth daily.      . sildenafil (REVATIO) 20 MG tablet Take 3 tablets (60 mg total) by mouth 3 (three) times daily.  270 tablet  6  . tacrolimus (PROGRAF) 1 MG capsule Take 6 mg by mouth 2 (two) times daily.       Marland Kitchen torsemide (DEMADEX) 20 MG tablet Take 20 mg by mouth 2 (two) times daily.       Marland Kitchen DISCONTD: methotrexate (RHEUMATREX) 5 MG tablet Take 5 mg by mouth 2 (two) times a week. Takes on Mon and Vianne Bulls [id number] 03/29/2012, 4:08 PM

## 2012-03-30 ENCOUNTER — Ambulatory Visit (HOSPITAL_COMMUNITY)
Admission: RE | Admit: 2012-03-30 | Discharge: 2012-03-30 | Disposition: A | Discharge status: Home / Self Care | Payer: Medicare Other | Source: Ambulatory Visit | Attending: Internal Medicine | Admitting: Internal Medicine

## 2012-03-30 ENCOUNTER — Encounter (HOSPITAL_COMMUNITY): Payer: Self-pay

## 2012-03-30 VITALS — BP 148/100 | HR 94 | Resp 18 | Ht 64.0 in | Wt 226.1 lb

## 2012-03-30 DIAGNOSIS — Z87891 Personal history of nicotine dependence: Secondary | ICD-10-CM | POA: Insufficient documentation

## 2012-03-30 DIAGNOSIS — I472 Ventricular tachycardia, unspecified: Secondary | ICD-10-CM | POA: Insufficient documentation

## 2012-03-30 DIAGNOSIS — I428 Other cardiomyopathies: Secondary | ICD-10-CM | POA: Insufficient documentation

## 2012-03-30 DIAGNOSIS — Z941 Heart transplant status: Secondary | ICD-10-CM | POA: Insufficient documentation

## 2012-03-30 DIAGNOSIS — N189 Chronic kidney disease, unspecified: Secondary | ICD-10-CM | POA: Insufficient documentation

## 2012-03-30 DIAGNOSIS — I4729 Other ventricular tachycardia: Secondary | ICD-10-CM | POA: Insufficient documentation

## 2012-03-30 DIAGNOSIS — I129 Hypertensive chronic kidney disease with stage 1 through stage 4 chronic kidney disease, or unspecified chronic kidney disease: Secondary | ICD-10-CM | POA: Insufficient documentation

## 2012-03-30 DIAGNOSIS — I509 Heart failure, unspecified: Secondary | ICD-10-CM | POA: Insufficient documentation

## 2012-03-30 DIAGNOSIS — Z7982 Long term (current) use of aspirin: Secondary | ICD-10-CM | POA: Insufficient documentation

## 2012-03-30 DIAGNOSIS — E119 Type 2 diabetes mellitus without complications: Secondary | ICD-10-CM | POA: Insufficient documentation

## 2012-03-30 DIAGNOSIS — Z794 Long term (current) use of insulin: Secondary | ICD-10-CM | POA: Insufficient documentation

## 2012-03-30 DIAGNOSIS — I5022 Chronic systolic (congestive) heart failure: Secondary | ICD-10-CM

## 2012-03-30 NOTE — Patient Instructions (Signed)
Follow up in 2 months  Do the following things EVERYDAY: 1) Weigh yourself in the morning before breakfast. Write it down and keep it in a log. 2) Take your medicines as prescribed 3) Eat low salt foods--Limit salt (sodium) to 2000mg  per day.  4) Stay as active as you can everyday

## 2012-03-30 NOTE — Assessment & Plan Note (Addendum)
He is S/P heart transplant 08/2011. Post transplant course c/b RHF and renal insufficiency. Volume status elevated. He continues on Demadex 20 mg bid. Has been told by [facility] not to titrate demadex if possible due to renal failure. They have been discussing possible PD. Given volume overload today we will have him double up Demadex just for the the next 2 days.Reinforced low salt diet and daily weights. Plan for RHC and biopsy at the end of the month at [facility]. Follow up in two months.

## 2012-04-03 NOTE — Progress Notes (Signed)
Patient ID: David Gregory, male   DOB: 09-24-1966, 46 y.o.   MRN: [id number]  Pulmonolgist: Dr Halford Chessman Nephrology:  Cardiologist at [facility]:  Dr Gilles Chiquito Orthopedic Surgeon: Dr Ninfa Linden   HPI:  David Gregory is a 46 year old male with h/o CHF due to nonischemic cardiomyopathy  EF 15-25% s/p BiVICD.  He also has a history of HTN, DM, CRI, previous tobacco and alcohol use (now abstinent since 12/10). Underwent OHTx in March 2012.   Post-op course c/b by RH failure with associated renal failure requiring an RVAD and TV ring annuloplasty. Has had mild rejection in June but all subsequent biopsies OK.   RHC at [facility] in May 2012 RA 12 PA 53/30 (36) PCW 13 Echo at [facility] 8/16: LVEF normal RV moderate HK. TV ring with mean gradient 7. No TR  Has been struggling with volume overload and predominantly RHF.  He was admitted on 9/4 for ultrafiltration.  He diuresed ~7 kg but with elevated Cr of 3.25 ultrafiltration was stopped and he was transitioned to oral demadex.  Revatio increased to 60 mg TID.    09/11/2011 [facility] RHC RA 27 RV 61/17 PA 61/23 (44) PCWP 23 V= 26 CI 3.5 PVR 2.8  10/02/2011 Creatinine 2.98  Potassium 4.0  10/17/2011 Creatinine 2.86 Potassium 4.3 11/04/2011   Creatinine 3.3 Potassium 3.9 12/27 Creatinine 2.9 Potassium 3.9 2/15 Creatinine 4.45 BUN 81 Potassium 3.8  He returns today for follow up.  Denies SOB/PND/Orthopnea/ CP. He continues on Demadex 20 mg bid. He has evaluated by nephrology at [facility] and he will eventually need dialysis. Plan for sleep study in June by Dr Halford Chessman. Weight at home 209-220. He been trying to follow low salt diet. Plan for RHC and biopsy at the end of the month.   ROS: All systems negative except as listed in HPI, PMH and Problem List.  Past Medical History  Diagnosis Date  . VENTRICULAR TACHYCARDIA   . SYSTOLIC HEART FAILURE, CHRONIC   . SYSTOLIC HEART FAILURE, ACUTE ON CHRONIC   . CONGESTIVE HEART FAILURE UNSPECIFIED   . Gout, unspecified   . ERECTILE DYSFUNCTION,  ORGANIC   . Edema   . Diarrhea   . CHEST PAIN UNSPECIFIED   . Hypertension   . Renal insufficiency     pt reports levels have been at 4.8  . Blood transfusion     with heart transplant  . Diabetes mellitus   . Hypothyroidism   . Carpal tunnel syndrome     Right  . Arthritis     "Every where"    Current Outpatient Prescriptions  Medication Sig Dispense Refill  . aspirin EC 81 MG tablet Take 81 mg by mouth daily.      . calcium-vitamin D (OSCAL WITH D) 500-200 MG-UNIT per tablet Take 1 tablet by mouth 2 (two) times daily.      . carvedilol (COREG) 25 MG tablet Take 25 mg by mouth 2 (two) times daily with a meal.       . colchicine 0.6 MG tablet Take 0.6 mg by mouth daily as needed. For gout      . insulin NPH-insulin regular (NOVOLIN 70/30) (70-30) 100 UNIT/ML injection Inject 26-78 Units into the skin See admin instructions. Inject 78 units in the morning and 26 units at night      . levothyroxine (SYNTHROID, LEVOTHROID) 25 MCG tablet Take 25 mcg by mouth daily.        . Multiple Vitamin (MULTIVITAMIN) tablet Take 1 tablet by mouth daily.        Marland Kitchen  mycophenolate (CELLCEPT) 500 MG tablet Take 1,500 mg by mouth 2 (two) times daily.       . pantoprazole (PROTONIX) 40 MG tablet Take 40 mg by mouth daily.        . pravastatin (PRAVACHOL) 20 MG tablet Take 20 mg by mouth daily.        . predniSONE (DELTASONE) 10 MG tablet Take 10 mg by mouth daily.      . sildenafil (REVATIO) 20 MG tablet Take 3 tablets (60 mg total) by mouth 3 (three) times daily.  270 tablet  6  . tacrolimus (PROGRAF) 1 MG capsule Take 6 mg by mouth 2 (two) times daily.       Marland Kitchen torsemide (DEMADEX) 20 MG tablet Take 20 mg by mouth 2 (two) times daily.          PHYSICAL EXAM: Filed Vitals:   03/30/12 1131  BP: 148/100  Pulse: 94  Resp: 18  Wt 226   General:  Well appearing. No resp difficulty HEENT: normal Neck: supple. JVP to jaw  Carotids 2+ bilaterally; no bruits. No lymphadenopathy or thryomegaly  appreciated. Cor: PMI normal. Regular rate & rhythm.  S3 Lungs: clear Abdomen: obese. Mildly distended  Non-tender. No bruits or masses. Good bowel sounds. Extremities: no cyanosis, clubbing, rash. No lower extremity   1+ edema  Neuro: alert & orientedx3, cranial nerves grossly intact. Moves all 4 extremities w/o difficulty. Affect pleasant    ASSESSMENT & PLAN:

## 2012-04-08 ENCOUNTER — Other Ambulatory Visit: Payer: Self-pay | Admitting: Cardiology

## 2012-04-27 ENCOUNTER — Ambulatory Visit (HOSPITAL_BASED_OUTPATIENT_CLINIC_OR_DEPARTMENT_OTHER): Payer: Medicare Other | Attending: Pulmonary Disease | Admitting: Radiology

## 2012-04-27 VITALS — Ht 64.0 in | Wt 215.0 lb

## 2012-04-27 DIAGNOSIS — Z941 Heart transplant status: Secondary | ICD-10-CM | POA: Insufficient documentation

## 2012-04-27 DIAGNOSIS — R0683 Snoring: Secondary | ICD-10-CM

## 2012-04-27 DIAGNOSIS — E119 Type 2 diabetes mellitus without complications: Secondary | ICD-10-CM | POA: Insufficient documentation

## 2012-04-27 DIAGNOSIS — I509 Heart failure, unspecified: Secondary | ICD-10-CM | POA: Insufficient documentation

## 2012-04-27 DIAGNOSIS — G4733 Obstructive sleep apnea (adult) (pediatric): Secondary | ICD-10-CM | POA: Insufficient documentation

## 2012-05-03 ENCOUNTER — Other Ambulatory Visit (HOSPITAL_COMMUNITY): Payer: Self-pay | Admitting: Adult Health

## 2012-05-11 ENCOUNTER — Telehealth: Payer: Self-pay | Admitting: Pulmonary Disease

## 2012-05-11 DIAGNOSIS — G4733 Obstructive sleep apnea (adult) (pediatric): Secondary | ICD-10-CM

## 2012-05-11 DIAGNOSIS — Z941 Heart transplant status: Secondary | ICD-10-CM

## 2012-05-11 DIAGNOSIS — E119 Type 2 diabetes mellitus without complications: Secondary | ICD-10-CM

## 2012-05-11 DIAGNOSIS — I509 Heart failure, unspecified: Secondary | ICD-10-CM

## 2012-05-11 NOTE — Telephone Encounter (Signed)
Order for CPAP set on pressure of 17 cm sent to [facility]. I called and spoke with patient and he is aware that this order has been placed. I asked [facility] to mail patient a financial form and also explained this to the patient. That there may be assistance available from the home care company, however, he must complete the form to be eligible.  Also explained to patient that [facility] would be doing an ONO on CPAP to see if pt would qualify for o2. Pt is aware of referral. Catha Gosselin

## 2012-05-11 NOTE — Procedures (Signed)
David Gregory, David [name]:  [id number]  MEDICAL RECORD NO.:  [id number]          PATIENT TYPE:  OUT  LOCATION:  SLEEP CENTER                 FACILITY:  [facility]  PHYSICIAN:  Chesley Mires, MD        DATE OF BIRTH:  Sep 01, 1966  DATE OF STUDY:  04/27/2012                           NOCTURNAL POLYSOMNOGRAM  REFERRING PHYSICIAN:  Chesley Mires, MD  FACILITY:  [facility] STUDY:  David Gregory is a 46 year old male who has a history of congestive heart failure, status post transplant and diabetes.  He also has snoring sleep disruption and daytime sleepiness. He was referred to the Sleep Lab for evaluation of hypersomnia with obstructive sleep apnea.  Height is 5 feet and 4 inches, weight is 215 pounds.  BMI is 37, neck size is 18.5 inches.  EPWORTH SLEEPINESS SCORE:  13.  MEDICATIONS:  Aspirin, calcium, Coreg, colchicine, Novolin, Synthroid, Zaroxolyn, CellCept, Protonix, Pravachol, prednisone, Revatio, Prograf, and Rheumatrex.  SLEEP ARCHITECTURE:  The patient followed a split night study protocol. During the diagnostic portion of the study, total recording time was 140 minutes, total sleep time was 120 minutes.  Sleep efficiency was 85%. Sleep latency was 14 minutes.  This portion of study was notable for lack of slow-wave sleep and REM sleep, and the patient slept in both supine and nonsupine positions.  During the titration portion of the study, total recording time was 245 minutes, total sleep time was 223 minutes.  Sleep efficiency was 91%. Sleep latency was 1 minute.  REM latency was 6.5 minutes.  This portion of the study was notable for the lack of slow-wave sleep and an increase in the percentage of REM sleep and he slept predominantly in the supine position.  RESPIRATORY DATA:  The average respiratory rate was 28.  Loud snoring was noted by the technician.  During the diagnostic portion of the study, the overall apnea-hypopnea  index was 139.5.  There were 7 mixed apneic events.  The remainder of the events were obstructive in nature.  During the titration portion of the study, the patient was started on CPAP of 5 and increased to 18 cm of water.  He was then transitioned to BiPAP starting at 21/17 and increased to 25/20.  He appeared to have good control of his sleep-disordered breathing with CPAP of 17 cm of water with reasonable control of his obstructive events.  Of note is that he has continued difficulties maintaining his oxygenation as well as central apneic events with higher pressures, on BiPAP.  OXYGEN DATA:  The baseline oxygenation was 91%.  The oxygen saturation nadir was 61%.  Again, the patient had difficulty maintaining his oxygen saturation in the absence of obstructive apneic events.  Of note is that the study was conducted without the use of supplemental oxygen.  CARDIAC DATA:  The average heart rate was 99 and the rhythm strip showed sinus rhythm with sinus tachycardia.  MOVEMENT-PARASOMNIA:  The periodic limb movement index was 0 and the patient had no restroom trips.  IMPRESSIONS-RECOMMENDATIONS:  This study shows evidence for very severe obstructive sleep apnea with an overall apnea-hypopnea index of 139.5 and oxygen saturation  nadir of 61%.  He had a suboptimal titration portion of the study.  It appeared that optimal initial setting would be to start at CPAP at 17 cm of water.  He had improved control of his sleep-disordered breathing with this pressure setting and he was observed in REM sleep and supine sleep at this pressure setting.  Of note, however, is that he had continued difficulties maintaining his oxygenation in the absence of obstructive respiratory events.  This should be consistent with sleep-related hyperventilation.  He did not have significant improvement with transitioned to BiPAP, but has had difficulties with more frequent central apneic events and  arousals.  In addition to diet, excise, and weight reduction, I would recommend that the patient would be started on CPAP at 17 cm of water.  He should then undergo an overnight oximetry on this setting to determine if he would require the use of supplemental oxygen in addition to CPAP therapy.  If this is unsuccessful in controlling his sleep-disordered breathing, then he may need to return to the Sleep Lab for a full-night titration study.     Chesley Mires, MD [place], [place] Board of Sleep Medicine    VS/MEDQ  D:  05/11/2012 08:56:43  T:  05/11/2012 10:31:48  Job:  [id number]

## 2012-05-11 NOTE — Telephone Encounter (Signed)
PSG 04/27/12 >> AHI 139.5, SpO2 low 61%.  CPAP 17 cm H2O.  PLMI 0.  Results reviewed with pt over phone.  Advised he needs to start CPAP 17 cm H2O, and then have ONO on his set up to determine if he needs supplemental oxygen.  He is concerned about expense, and would like to know how much this will cost him.  I emphasized that he has very severe sleep apnea, and this is likely contributing to his cardiovascular disease.  I will place order for CPAP 17 cm H2O and ONO.  Will have [facility] d/w patient about out of pocket expense for this.  Will have my nurse schedule ROV in 2 months after CPAP set up.

## 2012-05-13 NOTE — Telephone Encounter (Signed)
Pt is scheduled to come un aug 30 for follow up

## 2012-05-25 ENCOUNTER — Ambulatory Visit (HOSPITAL_COMMUNITY): Payer: Medicare Other

## 2012-05-31 ENCOUNTER — Telehealth (HOSPITAL_COMMUNITY): Payer: Self-pay | Admitting: *Deleted

## 2012-05-31 NOTE — Telephone Encounter (Signed)
David Gregory called. He has some questions regarding his recent visit to the sleep clinic and is hoping you can help him. Please call him back. Thanks.

## 2012-05-31 NOTE — Telephone Encounter (Signed)
Discussed purpose of CPAP machine. As noted by Dr Halford Chessman he has severe sleep apnea. Mr [place] plans to start CPAP.  Mr Damer was appreciative of the call back.

## 2012-06-17 ENCOUNTER — Encounter (HOSPITAL_COMMUNITY): Payer: Self-pay

## 2012-06-17 ENCOUNTER — Ambulatory Visit (HOSPITAL_COMMUNITY)
Admission: RE | Admit: 2012-06-17 | Discharge: 2012-06-17 | Disposition: A | Discharge status: Home / Self Care | Payer: Medicare Other | Source: Ambulatory Visit | Attending: Internal Medicine | Admitting: Internal Medicine

## 2012-06-17 VITALS — BP 145/85 | HR 95 | Ht 64.0 in | Wt 220.1 lb

## 2012-06-17 DIAGNOSIS — I5022 Chronic systolic (congestive) heart failure: Secondary | ICD-10-CM

## 2012-06-17 LAB — BASIC METABOLIC PANEL
BUN: 64 mg/dL — ABNORMAL HIGH (ref 6–23)
CO2: 18 mEq/L — ABNORMAL LOW (ref 19–32)
Calcium: 9.9 mg/dL (ref 8.4–10.5)
Chloride: 102 mEq/L (ref 96–112)
Creatinine, Ser: 3.27 mg/dL — ABNORMAL HIGH (ref 0.50–1.35)
GFR calc Af Amer: 24 mL/min — ABNORMAL LOW (ref >90)
GFR calc non Af Amer: 21 mL/min — ABNORMAL LOW (ref >90)
Glucose, Bld: 131 mg/dL — ABNORMAL HIGH (ref 70–99)
Potassium: 4.2 mEq/L (ref 3.5–5.1)
Sodium: 138 mEq/L (ref 135–145)

## 2012-06-17 NOTE — Assessment & Plan Note (Addendum)
He is doing well s/p OHTx however continues to battle RHF and renal insufficiency. Volume status actually looks good today. Due for BMET. Continue transplant f/u at [facility]. No symptoms of rejection at this point.

## 2012-06-17 NOTE — Assessment & Plan Note (Addendum)
Much improved with CPAP. F/u with Dr. Halford Chessman.

## 2012-06-17 NOTE — Patient Instructions (Addendum)
Follow up in 3 months   Do the following things EVERYDAY: 1) Weigh yourself in the morning before breakfast. Write it down and keep it in a log. 2) Take your medicines as prescribed 3) Eat low salt foods-Limit salt (sodium) to 2000 mg per day.  4) Stay as active as you can everyday 5) Limit all fluids for the day to less than 2 liters  

## 2012-06-20 NOTE — Progress Notes (Signed)
Patient ID: David Gregory, male   DOB: 29-May-1966, 46 y.o.   MRN: [id number] Pulmonolgist: Dr Halford Chessman Nephrology:  Cardiologist at [facility]:  Dr Gilles Chiquito Orthopedic Surgeon: Dr Ninfa Linden   HPI:  David Gregory is a 46 year old male with h/o CHF due to nonischemic cardiomyopathy  EF 15-25% s/p BiVICD.  He also has a history of HTN, DM, CRI, previous tobacco and alcohol use (now abstinent since 12/10). Underwent OHTx in March 2012.   Post-op course c/b by RH failure with associated renal failure requiring an RVAD and TV ring annuloplasty. Has had mild rejection in June but all subsequent biopsies OK.   RHC at [facility] in May 2012 RA 12 PA 53/30 (36) PCW 13 Echo at [facility] 8/16: LVEF normal RV moderate HK. TV ring with mean gradient 7. No TR  Has been struggling with volume overload and predominantly RHF.  He was admitted on 9/4 for ultrafiltration.  He diuresed ~7 kg but with elevated Cr of 3.25 ultrafiltration was stopped and he was transitioned to oral demadex.  Revatio increased to 60 mg TID.    09/11/2011 [facility] RHC RA 27 RV 61/17 PA 61/23 (44) PCWP 23 V= 26 CI 3.5 PVR 2.8  10/02/2011 Creatinine 2.98  Potassium 4.0  10/17/2011 Creatinine 2.86 Potassium 4.3 11/04/2011   Creatinine 3.3 Potassium 3.9 12/27 Creatinine 2.9 Potassium 3.9 2/15 Creatinine 4.45 BUN 81 Potassium 3.8  He returns today for follow up.  Denies SOB/PND/Orthopnea/ CP. He continues on Demadex 20 mg bid. He has not had any extra Demadex. Not weighing at home. Sleep study completed. Started using CPAP. Feels better using CPAP.  Limiting fluids. Last evaluated at [facility].   ROS: All systems negative except as listed in HPI, PMH and Problem List.  Past Medical History  Diagnosis Date  . VENTRICULAR TACHYCARDIA   . SYSTOLIC HEART FAILURE, CHRONIC   . SYSTOLIC HEART FAILURE, ACUTE ON CHRONIC   . CONGESTIVE HEART FAILURE UNSPECIFIED   . Gout, unspecified   . ERECTILE DYSFUNCTION, ORGANIC   . Edema   . Diarrhea   . CHEST PAIN UNSPECIFIED   .  Hypertension   . Renal insufficiency     pt reports levels have been at 4.8  . Blood transfusion     with heart transplant  . Diabetes mellitus   . Hypothyroidism   . Carpal tunnel syndrome     Right  . Arthritis     "Every where"    Current Outpatient Prescriptions  Medication Sig Dispense Refill  . aspirin EC 81 MG tablet Take 81 mg by mouth daily.      . calcium-vitamin D (OSCAL WITH D) 500-200 MG-UNIT per tablet Take 1 tablet by mouth 2 (two) times daily.      . carvedilol (COREG) 25 MG tablet Take 25 mg by mouth 2 (two) times daily with a meal.       . colchicine 0.6 MG tablet Take 0.6 mg by mouth daily as needed. For gout      . insulin NPH-insulin regular (NOVOLIN 70/30) (70-30) 100 UNIT/ML injection Inject 26-78 Units into the skin See admin instructions. Inject 78 units in the morning and 26 units at night      . levothyroxine (SYNTHROID, LEVOTHROID) 25 MCG tablet Take 25 mcg by mouth daily.        . Multiple Vitamin (MULTIVITAMIN) tablet Take 1 tablet by mouth daily.        . mycophenolate (CELLCEPT) 500 MG tablet Take 1,500 mg by mouth 2 (two)  times daily.       . pravastatin (PRAVACHOL) 20 MG tablet Take 20 mg by mouth daily.        . predniSONE (DELTASONE) 10 MG tablet Take 10 mg by mouth daily.      Marland Kitchen REVATIO 20 MG tablet TAKE THREE TABLETS BY MOUTH EVERY 8 HOURS  270 each  2  . tacrolimus (PROGRAF) 1 MG capsule Take 6 mg by mouth 2 (two) times daily.       Marland Kitchen torsemide (DEMADEX) 20 MG tablet Take 20 mg by mouth 2 (two) times daily. Two tabs in am 40 mg and 1 tab in pm 20 mg         PHYSICAL EXAM: Filed Vitals:   06/17/12 1050  BP: 145/85  Pulse: 95  Wt 220   General:  Well appearing. No resp difficulty HEENT: normal Neck: supple. JVP to jaw  Carotids 2+ bilaterally; no bruits. No lymphadenopathy or thryomegaly appreciated. Cor: PMI normal. Regular rate & rhythm.  S3 Lungs: clear Abdomen: obese. Mildly distended  Non-tender. No bruits or masses. Good bowel  sounds. Extremities: no cyanosis, clubbing, rash. No lower extremity  traceedema  Neuro: alert & orientedx3, cranial nerves grossly intact. Moves all 4 extremities w/o difficulty. Affect pleasant    ASSESSMENT & PLAN:

## 2012-07-02 IMAGING — CR DG CHEST 1V PORT
1 series · 1 of 1 positions shown · non-contrast
Comparison: 12/16/2010

CLINICAL DATA: Congestive heart failure

PORTABLE CHEST - 1 VIEW

[view not recorded]
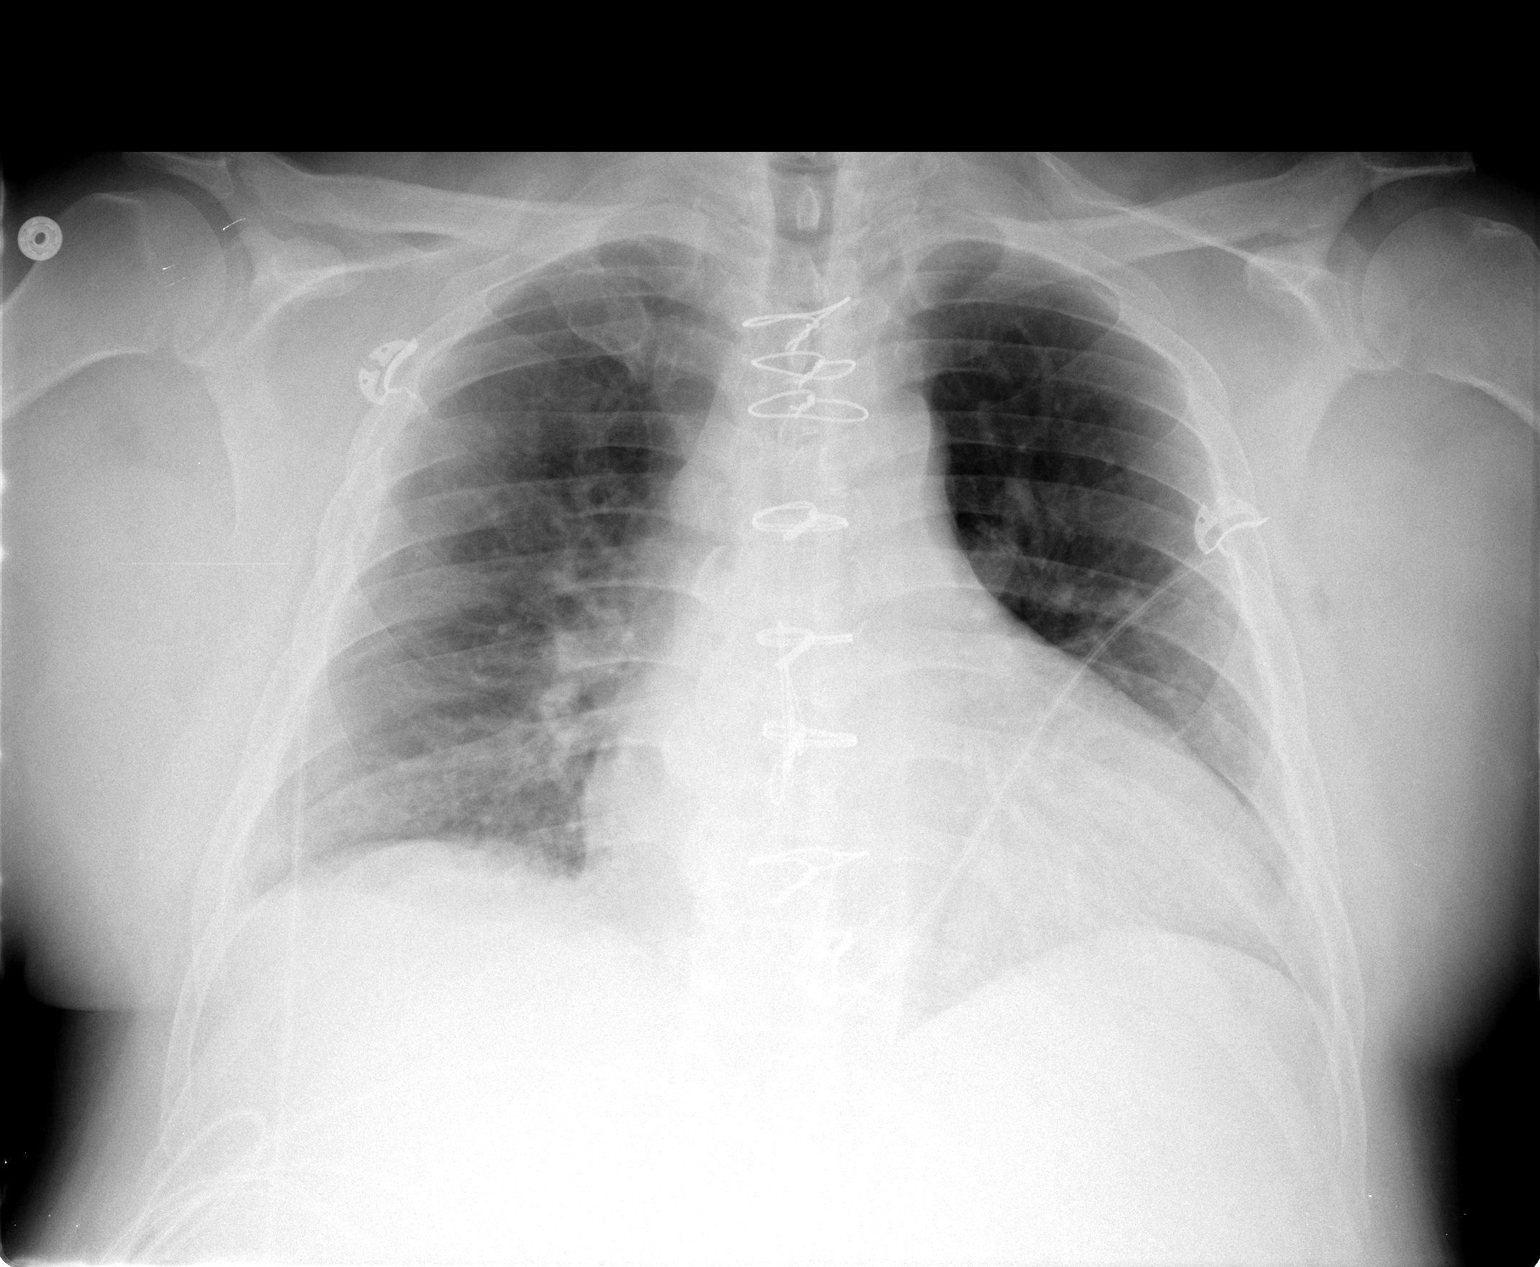

[1 of 1 positions shown; findings below may reference images not displayed]

FINDINGS: AICD has been removed.  Interval median sternotomy.
Cardiac enlargement.  Negative for infiltrate or edema or effusion.
IMPRESSION: Cardiac enlargement without acute abnormality.

## 2012-07-09 ENCOUNTER — Encounter: Payer: Self-pay | Admitting: Pulmonary Disease

## 2012-07-09 ENCOUNTER — Telehealth: Payer: Self-pay | Admitting: Pulmonary Disease

## 2012-07-09 DIAGNOSIS — IMO0002 Reserved for concepts with insufficient information to code with codable children: Secondary | ICD-10-CM

## 2012-07-09 DIAGNOSIS — G4736 Sleep related hypoventilation in conditions classified elsewhere: Secondary | ICD-10-CM

## 2012-07-09 DIAGNOSIS — G4733 Obstructive sleep apnea (adult) (pediatric): Secondary | ICD-10-CM

## 2012-07-09 NOTE — Telephone Encounter (Signed)
lmomtcb x1 

## 2012-07-09 NOTE — Telephone Encounter (Signed)
ONO with CPAP and RA 07/07/12>>Test time 4 hr 59 min.  Mean SpO2 91%, low SpO2 83%.  Spent 44 min with SpO2 < 89%  Attempted to call pt at both listed numbers.  Was informed that these numbers are incorrect, and pt does not live at these numbers.  He has oxygen desaturation in spite of using CPAP.  He will need to add 1 liter supplemental oxygen with CPAP and then repeat ONO.  I have sent order for this.  Will have my nurse attempt to contact pt by phone again to confirm plan.  If unable to do this will need to send letter.

## 2012-07-12 NOTE — Telephone Encounter (Signed)
I spoke with pt and he stated his home care company will be coming out today.

## 2012-07-12 NOTE — Telephone Encounter (Signed)
ATC pt did not have good cell phone reception wcb

## 2012-07-15 ENCOUNTER — Encounter: Payer: Self-pay | Admitting: Pulmonary Disease

## 2012-07-23 ENCOUNTER — Ambulatory Visit (INDEPENDENT_AMBULATORY_CARE_PROVIDER_SITE_OTHER): Payer: Medicare Other | Admitting: Pulmonary Disease

## 2012-07-23 ENCOUNTER — Encounter: Payer: Self-pay | Admitting: Pulmonary Disease

## 2012-07-23 VITALS — BP 124/82 | HR 92 | Temp 97.8°F | Ht 64.0 in | Wt 219.8 lb

## 2012-07-23 DIAGNOSIS — G4733 Obstructive sleep apnea (adult) (pediatric): Secondary | ICD-10-CM

## 2012-07-23 NOTE — Patient Instructions (Signed)
Will get copy of CPAP report and call with results Call if still having trouble with CPAP mask Follow up in 4 months

## 2012-07-23 NOTE — Progress Notes (Signed)
Chief Complaint  Patient presents with  . Follow-up    wears cpap everynight x 6 hrs a night. he has had to change his mask twice. He feels like the pressure is too high. he feels better since starter cpap. does not wake up tired    History of Present Illness: David Gregory is a 46 y.o. male with severe OSA using CPAP 17 cm H2O and 1 liter oxygen.  He is having trouble with his mask fit.  He also feels like his pressure is too high.  His mouth has been getting dry.  He is sleeping better, and feels better after using CPAP.   Past Medical History  Diagnosis Date  . VENTRICULAR TACHYCARDIA   . SYSTOLIC HEART FAILURE, CHRONIC   . SYSTOLIC HEART FAILURE, ACUTE ON CHRONIC   . CONGESTIVE HEART FAILURE UNSPECIFIED   . Gout, unspecified   . ERECTILE DYSFUNCTION, ORGANIC   . Edema   . Diarrhea   . CHEST PAIN UNSPECIFIED   . Hypertension   . Renal insufficiency     pt reports levels have been at 4.8  . Blood transfusion     with heart transplant  . Diabetes mellitus   . Hypothyroidism   . Carpal tunnel syndrome     Right  . Arthritis     "Every where"    Past Surgical History  Procedure Date  . Heart transplant feb/ 2012    Done at [facility] followed by Dr. Stann Mainland  . Fracture surgery     bilateral arms and l ankle fixed  . Eye surgery     "styl removed"  . Mass excision 01/27/2012    Procedure: EXCISION MASS;  Surgeon: Mcarthur Rossetti, MD;  Location: [facility];  Service: Orthopedics;  Laterality: Left;  Excision left middle finger mass    Outpatient Encounter Prescriptions as of 07/23/2012  Medication Sig Dispense Refill  . aspirin EC 81 MG tablet Take 81 mg by mouth daily.      . calcium-vitamin D (OSCAL WITH D) 500-200 MG-UNIT per tablet Take 1 tablet by mouth 2 (two) times daily.      . carvedilol (COREG) 25 MG tablet Take 25 mg by mouth 2 (two) times daily with a meal.       . colchicine 0.6 MG tablet Take 0.6 mg by mouth daily as needed. For gout      . insulin  NPH-insulin regular (NOVOLIN 70/30) (70-30) 100 UNIT/ML injection Inject 26-78 Units into the skin See admin instructions. Inject 78 units in the morning and 26 units at night      . levothyroxine (SYNTHROID, LEVOTHROID) 25 MCG tablet Take 25 mcg by mouth daily.        . Multiple Vitamin (MULTIVITAMIN) tablet Take 1 tablet by mouth daily.        . mycophenolate (CELLCEPT) 500 MG tablet Take 1,500 mg by mouth 2 (two) times daily.       . pravastatin (PRAVACHOL) 20 MG tablet Take 20 mg by mouth daily.        . predniSONE (DELTASONE) 10 MG tablet Take 10 mg by mouth daily.      Marland Kitchen REVATIO 20 MG tablet TAKE THREE TABLETS BY MOUTH EVERY 8 HOURS  270 each  2  . tacrolimus (PROGRAF) 1 MG capsule Take 6 mg by mouth 2 (two) times daily.       Marland Kitchen torsemide (DEMADEX) 20 MG tablet Take 20 mg by mouth 2 (two) times daily.  Allergies  Allergen Reactions  . Clindamycin Hcl     Wheezing and erythema/itching at IV site 01/27/12  . Prednisone Rash    Physical Exam:  Filed Vitals:   07/23/12 1339 07/23/12 1340  BP:  124/82  Pulse:  92  Temp: 97.8 F (36.6 C)   TempSrc: Oral   Height: 5\' 4"  (1.626 m)   Weight: 219 lb 12.8 oz (99.701 kg)   SpO2:  96%    Current Encounter SPO2  07/23/12 1340 96%  06/17/12 1050 99%  03/30/12 1131 96%     Body mass index is 37.73 kg/(m^2). Wt Readings from Last 2 Encounters:  07/23/12 219 lb 12.8 oz (99.701 kg)  06/17/12 220 lb 1.9 oz (99.846 kg)    General - No distress ENT - TM clear, no sinus tenderness, no oral exudate, no LAN, no thyromegaly Cardiac - s1s2 regular, no murmur, pulses symmetric, no edema Chest - normal respiratory excursion, good air entry, no wheeze/rales/dullness Back - no focal tenderness Abd - soft, non-tender, no organomegaly, + bowel sounds Ext - normal motor strength Neuro - Cranial nerves are normal. PERLA. EOM's intact. Skin - no discernible active dermatitis, erythema, urticaria or inflammatory process. Psych - normal  mood, and behavior.   Assessment/Plan:  Chesley Mires, MD [place] [facility]/Critical [facility]/Sleep Pager:  [phone] 07/23/2012, 1:59 PM

## 2012-07-23 NOTE — Assessment & Plan Note (Signed)
He has trouble with his mask fit and pressure settings.  He will see how he does with recent change to mask.  Will get his CPAP download and call him with results.  He may need to be switched to BPAP.

## 2012-08-02 ENCOUNTER — Encounter: Payer: Self-pay | Admitting: Pulmonary Disease

## 2012-08-06 ENCOUNTER — Telehealth: Payer: Self-pay | Admitting: Internal Medicine

## 2012-08-06 NOTE — Progress Notes (Signed)
Reviewed all notes from epic on pt with Dr Fitzgerald-needs cardiac clearence for Dr Haroldine Laws and can his carpal tunnel release be done at [facility] on [address]-out pt? Gisela at [facility] cardiology called. Will await response.

## 2012-08-06 NOTE — Telephone Encounter (Signed)
New problem:  Cardiac clearance from for carpal tunnel, last office visit  7/13. Under general regional . Can this be held day surgery center.

## 2012-08-06 NOTE — Progress Notes (Signed)
Waiting on cardiac clearance-will need istat dos-pt states he is doing well-no sob-to bring meds dos and cpap0to wear cpap when he gets home

## 2012-08-09 NOTE — Telephone Encounter (Signed)
Note faxed.

## 2012-08-09 NOTE — Telephone Encounter (Signed)
Will send to Dr Bensimhon for review 

## 2012-08-09 NOTE — Telephone Encounter (Signed)
[place] for carpal tunnel surgery.

## 2012-08-10 ENCOUNTER — Encounter (HOSPITAL_BASED_OUTPATIENT_CLINIC_OR_DEPARTMENT_OTHER): Admission: RE | Payer: Self-pay | Source: Ambulatory Visit

## 2012-08-10 ENCOUNTER — Ambulatory Visit (HOSPITAL_BASED_OUTPATIENT_CLINIC_OR_DEPARTMENT_OTHER): Admission: RE | Admit: 2012-08-10 | Payer: Medicare Other | Source: Ambulatory Visit | Admitting: Orthopedic Surgery

## 2012-08-10 ENCOUNTER — Telehealth: Payer: Self-pay | Admitting: Pulmonary Disease

## 2012-08-10 ENCOUNTER — Encounter: Payer: Self-pay | Admitting: Pulmonary Disease

## 2012-08-10 DIAGNOSIS — G4733 Obstructive sleep apnea (adult) (pediatric): Secondary | ICD-10-CM

## 2012-08-10 SURGERY — CARPAL TUNNEL RELEASE
Anesthesia: Regional | Laterality: Right

## 2012-08-10 NOTE — Telephone Encounter (Signed)
CPAP 07/01/12 to 07/30/12>>Used on 20 of 30 nights with average 5 hrs 43 min.  Average AHI 2 with CPAP 17 cm H2O.  Discussed with pt.  Will decrease CPAP to 15 cm H2O and see if this improves his tolerance.  Advised him to call back in two weeks if no improvement.

## 2012-08-26 ENCOUNTER — Other Ambulatory Visit: Payer: Self-pay | Admitting: Orthopedic Surgery

## 2012-09-01 NOTE — Progress Notes (Signed)
Surgery cancelled here-needed cardiac clearence from dr Narda Rutherford that now, to come in for bmet-called [facility] for recent ekg Reviewed with dr Manus Rudd

## 2012-09-01 NOTE — Progress Notes (Signed)
To bring all meds and cpap

## 2012-09-02 ENCOUNTER — Encounter (HOSPITAL_BASED_OUTPATIENT_CLINIC_OR_DEPARTMENT_OTHER)
Admission: RE | Admit: 2012-09-02 | Discharge: 2012-09-02 | Disposition: A | Discharge status: Home / Self Care | Payer: Medicare Other | Source: Ambulatory Visit | Attending: Orthopedic Surgery | Admitting: Orthopedic Surgery

## 2012-09-02 LAB — BASIC METABOLIC PANEL
BUN: 23 mg/dL (ref 6–23)
CO2: 21 mEq/L (ref 19–32)
Calcium: 9.9 mg/dL (ref 8.4–10.5)
Chloride: 106 mEq/L (ref 96–112)
Creatinine, Ser: 1.67 mg/dL — ABNORMAL HIGH (ref 0.50–1.35)
GFR calc Af Amer: 55 mL/min — ABNORMAL LOW (ref >90)
GFR calc non Af Amer: 47 mL/min — ABNORMAL LOW (ref >90)
Glucose, Bld: 130 mg/dL — ABNORMAL HIGH (ref 70–99)
Potassium: 4.5 mEq/L (ref 3.5–5.1)
Sodium: 141 mEq/L (ref 135–145)

## 2012-09-06 ENCOUNTER — Encounter (HOSPITAL_BASED_OUTPATIENT_CLINIC_OR_DEPARTMENT_OTHER): Payer: Self-pay | Admitting: Anesthesiology

## 2012-09-06 ENCOUNTER — Encounter (HOSPITAL_BASED_OUTPATIENT_CLINIC_OR_DEPARTMENT_OTHER): Payer: Self-pay

## 2012-09-06 ENCOUNTER — Encounter (HOSPITAL_BASED_OUTPATIENT_CLINIC_OR_DEPARTMENT_OTHER): Payer: Self-pay | Admitting: Certified Registered"

## 2012-09-06 ENCOUNTER — Ambulatory Visit (HOSPITAL_BASED_OUTPATIENT_CLINIC_OR_DEPARTMENT_OTHER)
Admission: RE | Admit: 2012-09-06 | Discharge: 2012-09-06 | Disposition: A | Discharge status: Home / Self Care | Payer: Medicare Other | Source: Ambulatory Visit | Attending: Orthopedic Surgery | Admitting: Orthopedic Surgery

## 2012-09-06 ENCOUNTER — Ambulatory Visit (HOSPITAL_BASED_OUTPATIENT_CLINIC_OR_DEPARTMENT_OTHER): Payer: Medicare Other | Admitting: Anesthesiology

## 2012-09-06 ENCOUNTER — Encounter (HOSPITAL_BASED_OUTPATIENT_CLINIC_OR_DEPARTMENT_OTHER)
Admission: RE | Disposition: A | Discharge status: Home / Self Care | Payer: Self-pay | Source: Ambulatory Visit | Attending: Orthopedic Surgery

## 2012-09-06 ENCOUNTER — Encounter (HOSPITAL_BASED_OUTPATIENT_CLINIC_OR_DEPARTMENT_OTHER): Payer: Self-pay | Admitting: Orthopedic Surgery

## 2012-09-06 DIAGNOSIS — G5600 Carpal tunnel syndrome, unspecified upper limb: Secondary | ICD-10-CM | POA: Insufficient documentation

## 2012-09-06 DIAGNOSIS — I10 Essential (primary) hypertension: Secondary | ICD-10-CM | POA: Insufficient documentation

## 2012-09-06 DIAGNOSIS — Z794 Long term (current) use of insulin: Secondary | ICD-10-CM | POA: Insufficient documentation

## 2012-09-06 DIAGNOSIS — G4730 Sleep apnea, unspecified: Secondary | ICD-10-CM | POA: Insufficient documentation

## 2012-09-06 DIAGNOSIS — Z01812 Encounter for preprocedural laboratory examination: Secondary | ICD-10-CM | POA: Insufficient documentation

## 2012-09-06 DIAGNOSIS — E119 Type 2 diabetes mellitus without complications: Secondary | ICD-10-CM | POA: Insufficient documentation

## 2012-09-06 HISTORY — PX: CARPAL TUNNEL RELEASE: SHX101

## 2012-09-06 LAB — GLUCOSE, CAPILLARY
Glucose-Capillary: 209 mg/dL — ABNORMAL HIGH (ref 70–99)
Glucose-Capillary: 160 mg/dL — ABNORMAL HIGH (ref 70–99)

## 2012-09-06 SURGERY — CARPAL TUNNEL RELEASE
Anesthesia: Monitor Anesthesia Care | Site: Wrist | Laterality: Right | Wound class: Clean

## 2012-09-06 MED ORDER — LACTATED RINGERS IV SOLN: INTRAVENOUS | Status: DC

## 2012-09-06 MED ORDER — LACTATED RINGERS IV SOLN
INTRAVENOUS | Status: DC
  Administered 2012-09-06: 13:00:00 via INTRAVENOUS

## 2012-09-06 MED ORDER — OXYCODONE HCL 5 MG/5ML PO SOLN: 5.0000 mg | Freq: Once | ORAL | Status: DC | PRN

## 2012-09-06 MED ORDER — CHLORHEXIDINE GLUCONATE 4 % EX LIQD: 60.0000 mL | Freq: Once | CUTANEOUS | Status: DC

## 2012-09-06 MED ORDER — OXYCODONE-ACETAMINOPHEN 5-325 MG PO TABS: 1.0000 | ORAL_TABLET | ORAL | Status: DC | PRN

## 2012-09-06 MED ORDER — ONDANSETRON HCL 4 MG/2ML IJ SOLN
INTRAMUSCULAR | Status: DC | PRN
  Administered 2012-09-06: 4 mg via INTRAVENOUS

## 2012-09-06 MED ORDER — FENTANYL CITRATE 0.05 MG/ML IJ SOLN
INTRAMUSCULAR | Status: DC | PRN
  Administered 2012-09-06: 25 ug via INTRAVENOUS
  Administered 2012-09-06: 50 ug via INTRAVENOUS
  Administered 2012-09-06: 25 ug via INTRAVENOUS

## 2012-09-06 MED ORDER — BUPIVACAINE HCL (PF) 0.25 % IJ SOLN
INTRAMUSCULAR | Status: DC | PRN
  Administered 2012-09-06: 10 mL

## 2012-09-06 MED ORDER — CEFAZOLIN SODIUM-DEXTROSE 2-3 GM-% IV SOLR
2.0000 g | INTRAVENOUS | Status: AC
  Administered 2012-09-06: 2 g via INTRAVENOUS

## 2012-09-06 MED ORDER — CEPHALEXIN 500 MG PO CAPS: 500.0000 mg | ORAL_CAPSULE | Freq: Four times a day (QID) | ORAL | Status: DC

## 2012-09-06 MED ORDER — MIDAZOLAM HCL 5 MG/5ML IJ SOLN
INTRAMUSCULAR | Status: DC | PRN
  Administered 2012-09-06: 1 mg via INTRAVENOUS
  Administered 2012-09-06: 0.5 mg via INTRAVENOUS

## 2012-09-06 MED ORDER — PROPOFOL 10 MG/ML IV EMUL
INTRAVENOUS | Status: DC | PRN
  Administered 2012-09-06: 25 ug/kg/min via INTRAVENOUS

## 2012-09-06 MED ORDER — HYDROMORPHONE HCL PF 1 MG/ML IJ SOLN: 0.2500 mg | INTRAMUSCULAR | Status: DC | PRN

## 2012-09-06 MED ORDER — ONDANSETRON HCL 4 MG/2ML IJ SOLN: 4.0000 mg | Freq: Once | INTRAMUSCULAR | Status: DC | PRN

## 2012-09-06 MED ORDER — LIDOCAINE HCL (PF) 0.5 % IJ SOLN
INTRAMUSCULAR | Status: DC | PRN
  Administered 2012-09-06: 35 mL via INTRAVENOUS

## 2012-09-06 MED ORDER — OXYCODONE HCL 5 MG PO TABS: 5.0000 mg | ORAL_TABLET | Freq: Once | ORAL | Status: DC | PRN

## 2012-09-06 MED ORDER — LIDOCAINE HCL (CARDIAC) 20 MG/ML IV SOLN
INTRAVENOUS | Status: DC | PRN
  Administered 2012-09-06: 30 mg via INTRAVENOUS

## 2012-09-06 SURGICAL SUPPLY — 37 items
BANDAGE GAUZE ELAST BULKY 4 IN (GAUZE/BANDAGES/DRESSINGS) ×2 IMPLANT
BLADE SURG 15 STRL LF DISP TIS (BLADE) ×1 IMPLANT
BLADE SURG 15 STRL SS (BLADE) ×2
BNDG CMPR 9X4 STRL LF SNTH (GAUZE/BANDAGES/DRESSINGS)
BNDG COHESIVE 3X5 TAN STRL LF (GAUZE/BANDAGES/DRESSINGS) ×2 IMPLANT
BNDG ESMARK 4X9 LF (GAUZE/BANDAGES/DRESSINGS) IMPLANT
CHLORAPREP W/TINT 26ML (MISCELLANEOUS) ×2 IMPLANT
CLOTH BEACON ORANGE TIMEOUT ST (SAFETY) ×2 IMPLANT
CORDS BIPOLAR (ELECTRODE) ×2 IMPLANT
COVER MAYO STAND STRL (DRAPES) ×2 IMPLANT
COVER TABLE BACK 60X90 (DRAPES) ×2 IMPLANT
CUFF TOURNIQUET SINGLE 18IN (TOURNIQUET CUFF) ×2 IMPLANT
DRAPE EXTREMITY T 121X128X90 (DRAPE) ×2 IMPLANT
DRAPE SURG 17X23 STRL (DRAPES) ×2 IMPLANT
DRSG KUZMA FLUFF (GAUZE/BANDAGES/DRESSINGS) ×2 IMPLANT
GAUZE XEROFORM 1X8 LF (GAUZE/BANDAGES/DRESSINGS) ×2 IMPLANT
GLOVE BIO SURGEON STRL SZ 6.5 (GLOVE) ×2 IMPLANT
GLOVE BIOGEL PI IND STRL 8 (GLOVE) ×1 IMPLANT
GLOVE BIOGEL PI INDICATOR 8 (GLOVE) ×1
GLOVE SURG ORTHO 8.0 STRL STRW (GLOVE) ×2 IMPLANT
GOWN BRE IMP PREV XXLGXLNG (GOWN DISPOSABLE) ×2 IMPLANT
GOWN PREVENTION PLUS XLARGE (GOWN DISPOSABLE) ×2 IMPLANT
NEEDLE 27GAX1X1/2 (NEEDLE) ×1 IMPLANT
NS IRRIG 1000ML POUR BTL (IV SOLUTION) ×2 IMPLANT
PACK BASIN DAY SURGERY FS (CUSTOM PROCEDURE TRAY) ×2 IMPLANT
PAD CAST 3X4 CTTN HI CHSV (CAST SUPPLIES) ×1 IMPLANT
PADDING CAST ABS 4INX4YD NS (CAST SUPPLIES) ×1
PADDING CAST ABS COTTON 4X4 ST (CAST SUPPLIES) ×1 IMPLANT
PADDING CAST COTTON 3X4 STRL (CAST SUPPLIES) ×2
SPONGE GAUZE 4X4 12PLY (GAUZE/BANDAGES/DRESSINGS) ×2 IMPLANT
STOCKINETTE 4X48 STRL (DRAPES) ×2 IMPLANT
SUT VICRYL 4-0 PS2 18IN ABS (SUTURE) IMPLANT
SUT VICRYL RAPIDE 4/0 PS 2 (SUTURE) ×2 IMPLANT
SYR BULB 3OZ (MISCELLANEOUS) ×2 IMPLANT
SYR CONTROL 10ML LL (SYRINGE) ×2 IMPLANT
TOWEL OR 17X24 6PK STRL BLUE (TOWEL DISPOSABLE) ×2 IMPLANT
UNDERPAD 30X30 INCONTINENT (UNDERPADS AND DIAPERS) ×2 IMPLANT

## 2012-09-06 NOTE — Anesthesia Preprocedure Evaluation (Signed)
Anesthesia Evaluation  Patient identified by MRN, date of birth, ID band Patient awake    Reviewed: Allergy & Precautions, H&P , NPO status , Patient's Chart, lab work & pertinent test results  Airway Mallampati: II TM Distance: >3 FB Neck ROM: Full    Dental  (+) Teeth Intact and Dental Advisory Given   Pulmonary sleep apnea and Continuous Positive Airway Pressure Ventilation ,  breath sounds clear to auscultation        Cardiovascular Exercise Tolerance: Poor hypertension, Pt. on medications Rhythm:Regular Rate:Normal     Neuro/Psych    GI/Hepatic   Endo/Other  diabetes, Well Controlled, Type 2, Insulin DependentMorbid obesity  Renal/GU Renal InsufficiencyRenal disease     Musculoskeletal   Abdominal   Peds  Hematology   Anesthesia Other Findings   Reproductive/Obstetrics                           Anesthesia Physical Anesthesia Plan  ASA: IV  Anesthesia Plan: MAC and Bier Block   Post-op Pain Management:    Induction: Intravenous  Airway Management Planned: Simple Face Mask  Additional Equipment:   Intra-op Plan:   Post-operative Plan:   Informed Consent: I have reviewed the patients History and Physical, chart, labs and discussed the procedure including the risks, benefits and alternatives for the proposed anesthesia with the patient or authorized representative who has indicated his/her understanding and acceptance.   Dental advisory given  Plan Discussed with: CRNA, Surgeon and Anesthesiologist  Anesthesia Plan Comments:         Anesthesia Quick Evaluation

## 2012-09-06 NOTE — Anesthesia Procedure Notes (Signed)
Procedure Name: MAC Date/Time: 09/06/2012 1:25 PM Performed by: Denny Levy Pre-anesthesia Checklist: Patient identified, Timeout performed, Emergency Drugs available, Suction available and Patient being monitored Patient Re-evaluated:Patient Re-evaluated prior to inductionOxygen Delivery Method: Simple face mask Placement Confirmation: positive ETCO2

## 2012-09-06 NOTE — H&P (Signed)
David Gregory is a 46 year-old right-hand dominant male who comes in complaining of pain, numbness and tingling of his right hand.  He has history of gout, a heart transplant that was done approximately 18 months ago. He has history of diabetes, arthritis.  He is complaining of pain, numbness and tingling in his right hand and wrist to the point that he will not let you touch it.  He has had a mass excised from his left middle finger. He complains of multiple knots on his hand.  He has had history of carpal tunnel syndrome with nerve conductions being positive in 2011.  He has pain in his right elbow.  He complains of constant, extremely severe throbbing aching pain with a feeling of numbness.  He has discrete significant pain with motion to his wrist on the right side.  He is not complaining of his left side.  He is not on any anticoagulant other than aspirin.   He has an acute gouty attack.  His uric acid was 10+.  He states that this has settled down.  He has had his nerve conductions on his right side.  There is no response to either the motor or sensory component of the nerve.  He had carpal tunnel syndrome along with cubital tunnel on his left side.    ALLERGIES:     Prednisone  MEDICATIONS:    Aspirin, calcium/D, carvedilol, CellCept, Colcrys, docusate sodium, furosemide, glucagon, hydrocodone/acetaminophen, insulin NPH/regular, levothyroxine, loratadine, multivitamins, nystatin, oxycodone, pantoprazole, pravastatin, prednisone, Prograf, Revatio, torsemide and valacyclovir   SURGICAL HISTORY:    He has had fractures of his arm and ankle.   FAMILY MEDICAL HISTORY:    Positive for diabetes, heart disease, high blood pressure and arthritis.  SOCIAL HISTORY:    He does not smoke or drink.  He is single.  REVIEW OF SYSTEMS:    Positive for glasses, high blood pressure, otherwise negative.  David Gregory is an 46 y.o. male.   Chief Complaint: CTS Rt HPI: see above  Past Medical History    Diagnosis Date  . VENTRICULAR TACHYCARDIA   . SYSTOLIC HEART FAILURE, CHRONIC   . SYSTOLIC HEART FAILURE, ACUTE ON CHRONIC   . CONGESTIVE HEART FAILURE UNSPECIFIED   . Gout, unspecified   . ERECTILE DYSFUNCTION, ORGANIC   . Edema   . Diarrhea   . CHEST PAIN UNSPECIFIED   . Hypertension   . Renal insufficiency     pt reports levels have been at 4.8  . Blood transfusion     with heart transplant  . Diabetes mellitus   . Hypothyroidism   . Carpal tunnel syndrome     Right  . Arthritis     "Every where"    Past Surgical History  Procedure Date  . Heart transplant feb/ 2012    Done at [facility] followed by Dr. Stann Mainland  . Fracture surgery     bilateral arms and l ankle fixed  . Eye surgery     "styl removed"  . Mass excision 01/27/2012    Procedure: EXCISION MASS;  Surgeon: Mcarthur Rossetti, MD;  Location: [place];  Service: Orthopedics;  Laterality: Left;  Excision left middle finger mass    Family History  Problem Relation Age of Onset  . Diabetes Mother   . Anesthesia problems Neg Hx   . Diabetes Father   . Heart disease Father    Social History:  reports that he quit smoking about 3 years ago. He does not  have any smokeless tobacco history on file. He reports that he does not drink alcohol or use illicit drugs.  Allergies:  Allergies  Allergen Reactions  . Clindamycin Hcl     Wheezing and erythema/itching at IV site 01/27/12  . Prednisone Rash    Medications Prior to Admission  Medication Sig Dispense Refill  . aspirin EC 81 MG tablet Take 81 mg by mouth daily.      . calcium-vitamin D (OSCAL WITH D) 500-200 MG-UNIT per tablet Take 1 tablet by mouth 2 (two) times daily.      . carvedilol (COREG) 25 MG tablet Take 25 mg by mouth 2 (two) times daily with a meal.       . colchicine 0.6 MG tablet Take 0.6 mg by mouth daily as needed. For gout      . insulin NPH-insulin regular (NOVOLIN 70/30) (70-30) 100 UNIT/ML injection Inject 26-78 Units into the skin See admin  instructions. Inject 78 units in the morning and 26 units at night      . levothyroxine (SYNTHROID, LEVOTHROID) 25 MCG tablet Take 25 mcg by mouth daily.        . Multiple Vitamin (MULTIVITAMIN) tablet Take 1 tablet by mouth daily.        . mycophenolate (CELLCEPT) 500 MG tablet Take 1,500 mg by mouth 2 (two) times daily.       . pravastatin (PRAVACHOL) 20 MG tablet Take 20 mg by mouth daily.        . predniSONE (DELTASONE) 10 MG tablet Take 10 mg by mouth daily.      Marland Kitchen REVATIO 20 MG tablet TAKE THREE TABLETS BY MOUTH EVERY 8 HOURS  270 each  2  . tacrolimus (PROGRAF) 1 MG capsule Take 6 mg by mouth 2 (two) times daily.       Marland Kitchen torsemide (DEMADEX) 20 MG tablet Take 20 mg by mouth 2 (two) times daily.         No results found for this or any previous visit (from the past 48 hour(s)).  No results found.   Pertinent items are noted in HPI.  Blood pressure 138/94, pulse 88, temperature 98.8 F (37.1 C), temperature source Oral, resp. rate 18, height 5\' 4"  (1.626 m), weight 96.276 kg (212 lb 4 oz), SpO2 96.00%.  General appearance: alert, cooperative and appears stated age Head: Normocephalic, without obvious abnormality Neck: no adenopathy and supple, symmetrical, trachea midline Resp: clear to auscultation bilaterally Cardio: regular rate and rhythm, S1, S2 normal, no murmur, click, rub or gallop GI: soft, non-tender; bowel sounds normal; no masses,  no organomegaly Extremities: extremities normal, atraumatic, no cyanosis or edema Pulses: 2+ and symmetric Skin: Skin color, texture, turgor normal. No rashes or lesions Neurologic: Grossly normal Incision/Wound: na  Assessment/Plan RADIOGRAPHS:    X-rays of both hands and his elbow reveal significant changes indicative of gout with loss of joint space in all PIP/DIP joints with significant erosions of his middle finger, significant change in his nail bed on his left middle finger.  His wrist does not show significant changes.  His elbow  shows loculations in the epicondyle.  DIAGNOSIS:    Acute gouty attack, right wrist with carpal tunnel syndrome.    We have discussed with him the possibility of carpal tunnel release, the pre, peri and postoperative course were discussed along with the risks and complications.  The patient is aware there is no guarantee with the surgery, possibility of infection, recurrence, injury to arteries, nerves, tendons, incomplete  relief of symptoms and dystrophy.  He would like to proceed to have the right carpal tunnel released.  This will be scheduled as an outpatient.   Syniah Berne R 09/06/2012, 11:54 AM

## 2012-09-06 NOTE — Brief Op Note (Signed)
09/06/2012  2:05 PM  PATIENT:  David Gregory  46 y.o. male  PRE-OPERATIVE DIAGNOSIS:  RIGHT CARPAL TUNNEL SYNDROME  POST-OPERATIVE DIAGNOSIS:  right carpal tunnel syndrome  PROCEDURE:  Procedure(s) (LRB) with comments: CARPAL TUNNEL RELEASE (Right) - FOREARM BLOCK  SURGEON:  Surgeon(s) and Role:    * Wynonia Sours, MD - Primary  PHYSICIAN ASSISTANT:   ASSISTANTS: none   ANESTHESIA:   local and regional  EBL:  Total I/O In: 700 [I.V.:700] Out: -   BLOOD ADMINISTERED:none  DRAINS: none   LOCAL MEDICATIONS USED:  MARCAINE     SPECIMEN:  No Specimen  DISPOSITION OF SPECIMEN:  N/A  COUNTS:  YES  TOURNIQUET:   Total Tourniquet Time Documented: Forearm (Right) - 27 minutes  DICTATION: .Other Dictation: Dictation Number [id number]  PLAN OF CARE: Discharge to home after PACU  PATIENT DISPOSITION:  PACU - hemodynamically stable.

## 2012-09-06 NOTE — Anesthesia Postprocedure Evaluation (Signed)
  Anesthesia Post-op Note  Patient: David Gregory  Procedure(s) Performed: Procedure(s) (LRB) with comments: CARPAL TUNNEL RELEASE (Right) - FOREARM BLOCK  Patient Location: PACU  Anesthesia Type: MAC and Bier block  Level of Consciousness: awake, alert  and oriented  Airway and Oxygen Therapy: Patient Spontanous Breathing and Patient connected to face mask oxygen  Post-op Pain: mild  Post-op Assessment: Post-op Vital signs reviewed  Post-op Vital Signs: Reviewed  Complications: No apparent anesthesia complications

## 2012-09-06 NOTE — Op Note (Signed)
Dictation Number [id number]

## 2012-09-06 NOTE — Transfer of Care (Signed)
Immediate Anesthesia Transfer of Care Note  Patient: David Gregory  Procedure(s) Performed: Procedure(s) (LRB) with comments: CARPAL TUNNEL RELEASE (Right) - FOREARM BLOCK  Patient Location: PACU  Anesthesia Type: Bier block  Level of Consciousness: awake, alert , oriented and patient cooperative  Airway & Oxygen Therapy: Patient Spontanous Breathing and Patient connected to face mask oxygen  Post-op Assessment: Report given to PACU RN and Post -op Vital signs reviewed and stable  Post vital signs: Reviewed and stable  Complications: No apparent anesthesia complications

## 2012-09-07 ENCOUNTER — Encounter (HOSPITAL_BASED_OUTPATIENT_CLINIC_OR_DEPARTMENT_OTHER): Payer: Self-pay | Admitting: Orthopedic Surgery

## 2012-09-07 LAB — POCT HEMOGLOBIN-HEMACUE: Hemoglobin: 11.8 g/dL — ABNORMAL LOW (ref 13.0–17.0)

## 2012-09-07 NOTE — Op Note (Signed)
NAMEMONTARIUS, [name]:  [id number]  MEDICAL RECORD NO.:  [id number]  LOCATION:                                 FACILITY:  PHYSICIAN:  Daryll Brod, M.D.       DATE OF BIRTH:  01-Aug-1966  DATE OF PROCEDURE:  09/06/2012 DATE OF DISCHARGE:                              OPERATIVE REPORT   PREOPERATIVE DIAGNOSIS:  Carpal tunnel syndrome, right hand.  POSTOPERATIVE DIAGNOSIS:  Carpal tunnel syndrome, right hand.  OPERATION:  Decompression of right median nerve.  SURGEON:  Daryll Brod, M.D.  ANESTHESIA:  Forearm-based IV regional with local infiltration.  ANESTHESIOLOGIST:  Lorrene Reid, M.D.  HISTORY:  The patient is a 46 year old male with a history of gout, carpal tunnel syndrome, EMG nerve conductions positive.  He has elected to undergo surgical decompression.  Pre, peri, postoperative course have been discussed along with risks and complications.  He is aware that there is no guarantee with surgery; possibility of infection; recurrence of injury to arteries, nerves, tendons, incomplete relief of symptoms, dystrophy.  In the preoperative area, the patient is seen, the extremity marked by both the patient and surgeon.  Antibiotic given.  PROCEDURE:  The patient was brought to the operating room where forearm- based IV regional anesthetic was carried out without difficulty.  He was prepped using ChloraPrep, supine position with the right arm free.  A 3- minute dry time was allowed.  Time-out confirmed patient and procedure. A longitudinal incision was made in the palm, right hand, carried down through subcutaneous tissue.  Bleeders were electrocauterized with bipolar.  Palmar fascia was split.  Superficial palmar arch identified. Flexor tendon of the ring and little finger identified to the ulnar side of the median nerve.  The carpal retinaculum was incised with sharp dissection.  Right angle and Sewall retractor were placed between skin and forearm fascia.   The fascia released for approximately 1.5 cm proximal to the wrist crease under direct vision.  Gouty tophaceous material was present throughout the entire area.  Significant tophaceous gout was present in the flexor tendons of the ring and little finger to the point that these were virtually solid with tophus.  Area compression to the nerve was apparent.  No further lesions were identified.  The wound was copiously irrigated with saline.  It was elected not to proceed to do anything to the flexor tendons and that there was no way to determine where the tendon was, where the tophaceous material was. This was over a distance of approximately 5 cm in each tendon.  The wound was copiously irrigated with saline.  The skin then closed with interrupted 5-0 Vicryl Rapide sutures.  A local infiltration with 0.25% Marcaine without epinephrine was given.  A sterile compressive dressing was applied.  10 mL of Marcaine was used. On deflation of the tourniquet, all fingers immediately pinked.  He was taken to the recovery room for observation in satisfactory condition. He will be discharged home to return to the [facility] in 1 week on Percocet.          ______________________________ Daryll Brod, M.D.     GK/MEDQ  D:  09/06/2012  T:  09/07/2012  Job:  [id number]

## 2012-11-01 ENCOUNTER — Ambulatory Visit (HOSPITAL_COMMUNITY): Payer: Medicare Other | Attending: Internal Medicine

## 2012-11-03 ENCOUNTER — Other Ambulatory Visit: Payer: Self-pay | Admitting: Internal Medicine

## 2012-11-09 ENCOUNTER — Other Ambulatory Visit: Payer: Self-pay | Admitting: Orthopedic Surgery

## 2012-11-22 ENCOUNTER — Encounter (HOSPITAL_BASED_OUTPATIENT_CLINIC_OR_DEPARTMENT_OTHER): Payer: Self-pay | Admitting: *Deleted

## 2012-11-22 ENCOUNTER — Encounter (HOSPITAL_BASED_OUTPATIENT_CLINIC_OR_DEPARTMENT_OTHER)
Admission: RE | Admit: 2012-11-22 | Discharge: 2012-11-22 | Disposition: A | Discharge status: Home / Self Care | Payer: Medicare Other | Source: Ambulatory Visit | Attending: Orthopedic Surgery | Admitting: Orthopedic Surgery

## 2012-11-22 LAB — BASIC METABOLIC PANEL
BUN: 42 mg/dL — ABNORMAL HIGH (ref 6–23)
CO2: 20 mEq/L (ref 19–32)
Calcium: 10.1 mg/dL (ref 8.4–10.5)
Chloride: 103 mEq/L (ref 96–112)
Creatinine, Ser: 2.11 mg/dL — ABNORMAL HIGH (ref 0.50–1.35)
GFR calc Af Amer: 41 mL/min — ABNORMAL LOW (ref >90)
GFR calc non Af Amer: 36 mL/min — ABNORMAL LOW (ref >90)
Glucose, Bld: 205 mg/dL — ABNORMAL HIGH (ref 70–99)
Potassium: 4.4 mEq/L (ref 3.5–5.1)
Sodium: 140 mEq/L (ref 135–145)

## 2012-11-22 NOTE — Progress Notes (Signed)
To come in for bmet Was here 10/13-will use cpap post op-go cardiac clearance in oct for ctr

## 2012-11-23 ENCOUNTER — Other Ambulatory Visit: Payer: Self-pay | Admitting: Orthopedic Surgery

## 2012-11-26 ENCOUNTER — Encounter (HOSPITAL_BASED_OUTPATIENT_CLINIC_OR_DEPARTMENT_OTHER): Payer: Self-pay | Admitting: Anesthesiology

## 2012-11-26 ENCOUNTER — Ambulatory Visit (HOSPITAL_BASED_OUTPATIENT_CLINIC_OR_DEPARTMENT_OTHER)
Admission: RE | Admit: 2012-11-26 | Discharge: 2012-11-26 | Disposition: A | Discharge status: Home / Self Care | Payer: Medicare Other | Source: Ambulatory Visit | Attending: Orthopedic Surgery | Admitting: Orthopedic Surgery

## 2012-11-26 ENCOUNTER — Encounter (HOSPITAL_BASED_OUTPATIENT_CLINIC_OR_DEPARTMENT_OTHER): Payer: Self-pay | Admitting: *Deleted

## 2012-11-26 ENCOUNTER — Ambulatory Visit (HOSPITAL_BASED_OUTPATIENT_CLINIC_OR_DEPARTMENT_OTHER): Payer: Medicare Other | Admitting: Anesthesiology

## 2012-11-26 ENCOUNTER — Encounter (HOSPITAL_BASED_OUTPATIENT_CLINIC_OR_DEPARTMENT_OTHER)
Admission: RE | Disposition: A | Discharge status: Home / Self Care | Payer: Self-pay | Source: Ambulatory Visit | Attending: Orthopedic Surgery

## 2012-11-26 DIAGNOSIS — Z794 Long term (current) use of insulin: Secondary | ICD-10-CM | POA: Insufficient documentation

## 2012-11-26 DIAGNOSIS — I10 Essential (primary) hypertension: Secondary | ICD-10-CM | POA: Insufficient documentation

## 2012-11-26 DIAGNOSIS — M129 Arthropathy, unspecified: Secondary | ICD-10-CM | POA: Insufficient documentation

## 2012-11-26 DIAGNOSIS — Z833 Family history of diabetes mellitus: Secondary | ICD-10-CM | POA: Insufficient documentation

## 2012-11-26 DIAGNOSIS — I509 Heart failure, unspecified: Secondary | ICD-10-CM | POA: Insufficient documentation

## 2012-11-26 DIAGNOSIS — E119 Type 2 diabetes mellitus without complications: Secondary | ICD-10-CM | POA: Insufficient documentation

## 2012-11-26 DIAGNOSIS — E039 Hypothyroidism, unspecified: Secondary | ICD-10-CM | POA: Insufficient documentation

## 2012-11-26 DIAGNOSIS — Z881 Allergy status to other antibiotic agents status: Secondary | ICD-10-CM | POA: Insufficient documentation

## 2012-11-26 DIAGNOSIS — M1A9XX1 Chronic gout, unspecified, with tophus (tophi): Secondary | ICD-10-CM | POA: Insufficient documentation

## 2012-11-26 DIAGNOSIS — Z8249 Family history of ischemic heart disease and other diseases of the circulatory system: Secondary | ICD-10-CM | POA: Insufficient documentation

## 2012-11-26 DIAGNOSIS — Z7982 Long term (current) use of aspirin: Secondary | ICD-10-CM | POA: Insufficient documentation

## 2012-11-26 HISTORY — PX: FINGER ARTHRODESIS: SHX5000

## 2012-11-26 LAB — POCT HEMOGLOBIN-HEMACUE: Hemoglobin: 10.7 g/dL — ABNORMAL LOW (ref 13.0–17.0)

## 2012-11-26 SURGERY — FUSION, JOINT, FINGER
Anesthesia: Regional | Site: Finger | Laterality: Right | Wound class: Other (see notes)

## 2012-11-26 MED ORDER — HYDROCODONE-ACETAMINOPHEN 5-325 MG PO TABS: 1.0000 | ORAL_TABLET | Freq: Four times a day (QID) | ORAL | Status: AC | PRN

## 2012-11-26 MED ORDER — MIDAZOLAM HCL 5 MG/5ML IJ SOLN
INTRAMUSCULAR | Status: DC | PRN
  Administered 2012-11-26 (×2): 1 mg via INTRAVENOUS

## 2012-11-26 MED ORDER — NITROGLYCERIN IN D5W 200-5 MCG/ML-% IV SOLN
INTRAVENOUS | Status: DC | PRN
  Administered 2012-11-26: 5 ug/min via INTRAVENOUS

## 2012-11-26 MED ORDER — CEPHALEXIN 250 MG PO CAPS: 250.0000 mg | ORAL_CAPSULE | Freq: Four times a day (QID) | ORAL | Status: DC

## 2012-11-26 MED ORDER — CEFAZOLIN SODIUM-DEXTROSE 2-3 GM-% IV SOLR
2.0000 g | INTRAVENOUS | Status: AC
  Administered 2012-11-26: 2 g via INTRAVENOUS

## 2012-11-26 MED ORDER — LIDOCAINE HCL (PF) 1 % IJ SOLN
INTRAMUSCULAR | Status: DC | PRN
  Administered 2012-11-26: 4 mL

## 2012-11-26 MED ORDER — CHLORHEXIDINE GLUCONATE 4 % EX LIQD: 60.0000 mL | Freq: Once | CUTANEOUS | Status: DC

## 2012-11-26 MED ORDER — FENTANYL CITRATE 0.05 MG/ML IJ SOLN
INTRAMUSCULAR | Status: DC | PRN
  Administered 2012-11-26 (×2): 50 ug via INTRAVENOUS

## 2012-11-26 MED ORDER — LACTATED RINGERS IV SOLN
INTRAVENOUS | Status: DC
  Administered 2012-11-26: 14:00:00 via INTRAVENOUS

## 2012-11-26 MED ORDER — FENTANYL CITRATE 0.05 MG/ML IJ SOLN: 25.0000 ug | INTRAMUSCULAR | Status: DC | PRN

## 2012-11-26 MED ORDER — OXYCODONE HCL 5 MG PO TABS: 5.0000 mg | ORAL_TABLET | Freq: Once | ORAL | Status: DC | PRN

## 2012-11-26 MED ORDER — METOPROLOL TARTRATE 25 MG PO TABS
25.0000 mg | ORAL_TABLET | Freq: Once | ORAL | Status: AC
  Administered 2012-11-26: 25 mg via ORAL

## 2012-11-26 MED ORDER — PROMETHAZINE HCL 25 MG/ML IJ SOLN: 6.2500 mg | INTRAMUSCULAR | Status: DC | PRN

## 2012-11-26 MED ORDER — OXYCODONE HCL 5 MG/5ML PO SOLN: 5.0000 mg | Freq: Once | ORAL | Status: DC | PRN

## 2012-11-26 MED ORDER — BUPIVACAINE HCL (PF) 0.25 % IJ SOLN
INTRAMUSCULAR | Status: DC | PRN
  Administered 2012-11-26: 4 mL

## 2012-11-26 MED ORDER — PROPOFOL 10 MG/ML IV EMUL
INTRAVENOUS | Status: DC | PRN
  Administered 2012-11-26: 75 ug/kg/min via INTRAVENOUS

## 2012-11-26 MED ORDER — LIDOCAINE HCL (CARDIAC) 20 MG/ML IV SOLN
INTRAVENOUS | Status: DC | PRN
  Administered 2012-11-26: 30 mg via INTRAVENOUS

## 2012-11-26 MED ORDER — MIDAZOLAM HCL 2 MG/2ML IJ SOLN: 0.5000 mg | Freq: Once | INTRAMUSCULAR | Status: DC | PRN

## 2012-11-26 MED ORDER — MEPERIDINE HCL 25 MG/ML IJ SOLN: 6.2500 mg | INTRAMUSCULAR | Status: DC | PRN

## 2012-11-26 SURGICAL SUPPLY — 55 items
BANDAGE GAUZE ELAST BULKY 4 IN (GAUZE/BANDAGES/DRESSINGS) ×2 IMPLANT
BLADE MINI RND TIP GREEN BEAV (BLADE) ×4 IMPLANT
BLADE SURG 15 STRL LF DISP TIS (BLADE) ×1 IMPLANT
BLADE SURG 15 STRL SS (BLADE) ×2
BNDG CMPR 9X4 STRL LF SNTH (GAUZE/BANDAGES/DRESSINGS) ×1
BNDG COHESIVE 1X5 TAN STRL LF (GAUZE/BANDAGES/DRESSINGS) ×1 IMPLANT
BNDG COHESIVE 3X5 TAN STRL LF (GAUZE/BANDAGES/DRESSINGS) ×2 IMPLANT
BNDG ESMARK 4X9 LF (GAUZE/BANDAGES/DRESSINGS) ×2 IMPLANT
BONE CHIP PRESERV 5CC PCAN5 (Bone Implant) ×2 IMPLANT
BUR FAST CUTTING MED (BURR) IMPLANT
CHLORAPREP W/TINT 26ML (MISCELLANEOUS) ×2 IMPLANT
CLOTH BEACON ORANGE TIMEOUT ST (SAFETY) ×2 IMPLANT
CORDS BIPOLAR (ELECTRODE) ×2 IMPLANT
COVER MAYO STAND STRL (DRAPES) ×2 IMPLANT
COVER TABLE BACK 60X90 (DRAPES) ×2 IMPLANT
CUFF TOURNIQUET SINGLE 18IN (TOURNIQUET CUFF) IMPLANT
DRAPE EXTREMITY T 121X128X90 (DRAPE) ×2 IMPLANT
DRAPE OEC MINIVIEW 54X84 (DRAPES) ×2 IMPLANT
DRAPE SURG 17X23 STRL (DRAPES) ×2 IMPLANT
GAUZE XEROFORM 1X8 LF (GAUZE/BANDAGES/DRESSINGS) ×2 IMPLANT
GLOVE BIO SURGEON STRL SZ 6.5 (GLOVE) ×2 IMPLANT
GLOVE BIOGEL PI IND STRL 7.5 (GLOVE) ×2 IMPLANT
GLOVE BIOGEL PI IND STRL 8.5 (GLOVE) ×1 IMPLANT
GLOVE BIOGEL PI INDICATOR 7.5 (GLOVE) ×2
GLOVE BIOGEL PI INDICATOR 8.5 (GLOVE) ×1
GLOVE ECLIPSE 7.0 STRL STRAW (GLOVE) ×4 IMPLANT
GLOVE SURG ORTHO 8.0 STRL STRW (GLOVE) ×2 IMPLANT
GOWN BRE IMP PREV XXLGXLNG (GOWN DISPOSABLE) ×2 IMPLANT
GOWN PREVENTION PLUS XLARGE (GOWN DISPOSABLE) ×2 IMPLANT
GOWN PREVENTION PLUS XXLARGE (GOWN DISPOSABLE) ×1 IMPLANT
GRAFT BNE CANC CHIPS 1-8 5CC (Bone Implant) IMPLANT
KWIRE 4.0 X .035IN (WIRE) ×2 IMPLANT
NEEDLE 27GAX1X1/2 (NEEDLE) ×1 IMPLANT
NS IRRIG 1000ML POUR BTL (IV SOLUTION) ×2 IMPLANT
PACK BASIN DAY SURGERY FS (CUSTOM PROCEDURE TRAY) ×2 IMPLANT
PAD CAST 3X4 CTTN HI CHSV (CAST SUPPLIES) ×1 IMPLANT
PADDING CAST ABS 3INX4YD NS (CAST SUPPLIES)
PADDING CAST ABS 4INX4YD NS (CAST SUPPLIES) ×1
PADDING CAST ABS COTTON 3X4 (CAST SUPPLIES) IMPLANT
PADDING CAST ABS COTTON 4X4 ST (CAST SUPPLIES) ×1 IMPLANT
PADDING CAST COTTON 3X4 STRL (CAST SUPPLIES) ×2
SLEEVE SCD COMPRESS KNEE MED (MISCELLANEOUS) ×1 IMPLANT
SPLINT FNGR BALL END 5/8X4.25 (SOFTGOODS) IMPLANT
SPLINT PLASTALUME BALL 4 1/4IN (SOFTGOODS) ×2
SPLINT PLASTER CAST XFAST 3X15 (CAST SUPPLIES) ×10 IMPLANT
SPLINT PLASTER XTRA FASTSET 3X (CAST SUPPLIES) ×10
SPONGE GAUZE 4X4 12PLY (GAUZE/BANDAGES/DRESSINGS) ×2 IMPLANT
STOCKINETTE 4X48 STRL (DRAPES) ×2 IMPLANT
SUT VICRYL 4-0 PS2 18IN ABS (SUTURE) ×2 IMPLANT
SUT VICRYL RAPIDE 4/0 PS 2 (SUTURE) ×2 IMPLANT
SYR BULB 3OZ (MISCELLANEOUS) ×2 IMPLANT
SYR CONTROL 10ML LL (SYRINGE) ×1 IMPLANT
TOWEL OR 17X24 6PK STRL BLUE (TOWEL DISPOSABLE) ×2 IMPLANT
UNDERPAD 30X30 INCONTINENT (UNDERPADS AND DIAPERS) ×2 IMPLANT
WATER STERILE IRR 1000ML POUR (IV SOLUTION) ×1 IMPLANT

## 2012-11-26 NOTE — H&P (Signed)
David Gregory is a 47 year-old right-hand dominant male who comes in complaining of pain, numbness and tingling of his right hand.  He has history of gout, a heart transplant that was done approximately 18 months ago. He has history of diabetes, arthritis.  He is complaining of pain, numbness and tingling in his right hand and wrist to the point that he will not let you touch it.  He has had a mass excised from his left middle finger. He complains of multiple knots on his hand.  He has had history of carpal tunnel syndrome with nerve conductions being positive in 2011.  He has pain in his right elbow.  He complains of constant, extremely severe throbbing aching pain with a feeling of numbness.  He has discrete significant pain with motion to his wrist on the right side.  He is not complaining of his left side.  He is not on any anticoagulant other than aspirin.    His uric acid was 10+.  He states that this has settled down.  He has had his nerve conductions on his right side.  There is no response to either the motor or sensory component of the nerve.  He had carpal tunnel syndrome along with cubital tunnel on his left side.  He is post right carpal tunnel release. He is doing quite well. He has no complaints of pain or discomfort. Sensation and circulation are intact. He can make a composite fist.   He is wondering about having something done to the DIP joint of his little finger right hand.  X-rays reveal a total destruction secondary to gout. We would recommend a fusion be performed along with excision of the tophus.   ALLERGIES:     Prednisone  MEDICATIONS:    Aspirin, calcium/D, carvedilol, CellCept, Colcrys, docusate sodium, furosemide, glucagon, hydrocodone/acetaminophen, insulin NPH/regular, levothyroxine, loratadine, multivitamins, nystatin, oxycodone, pantoprazole, pravastatin, prednisone, Prograf, Revatio, torsemide and valacyclovir   SURGICAL HISTORY:    He has had fractures of his arm and  ankle.   FAMILY MEDICAL HISTORY:    Positive for diabetes, heart disease, high blood pressure and arthritis.  SOCIAL HISTORY:    He does not smoke or drink.  He is single.  REVIEW OF SYSTEMS:    Positive for glasses, high blood pressure, otherwise negative.   David Gregory is an 47 y.o. male.   Chief Complaint: Gout destruction DIP rlf  HPI: see above  Past Medical History  Diagnosis Date  . VENTRICULAR TACHYCARDIA   . SYSTOLIC HEART FAILURE, CHRONIC   . SYSTOLIC HEART FAILURE, ACUTE ON CHRONIC   . CONGESTIVE HEART FAILURE UNSPECIFIED   . Gout, unspecified   . ERECTILE DYSFUNCTION, ORGANIC   . Edema   . Diarrhea   . CHEST PAIN UNSPECIFIED   . Hypertension   . Renal insufficiency     pt reports levels have been at 4.8  . Blood transfusion     with heart transplant  . Diabetes mellitus   . Hypothyroidism   . Carpal tunnel syndrome     Right  . Arthritis     "Every where"    Past Surgical History  Procedure Date  . Heart transplant feb/ 2012    Done at [facility] followed by Dr. Stann Mainland  . Fracture surgery     bilateral arms and l ankle fixed  . Eye surgery     "styl removed"  . Mass excision 01/27/2012    Procedure: EXCISION MASS;  Surgeon: Lind Guest  Ninfa Linden, MD;  Location: [place];  Service: Orthopedics;  Laterality: Left;  Excision left middle finger mass  . Carpal tunnel release 09/06/2012    Procedure: CARPAL TUNNEL RELEASE;  Surgeon: Wynonia Sours, MD;  Location: [place];  Service: Orthopedics;  Laterality: Right;  FOREARM BLOCK    Family History  Problem Relation Age of Onset  . Diabetes Mother   . Anesthesia problems Neg Hx   . Diabetes Father   . Heart disease Father    Social History:  reports that he quit smoking about 3 years ago. He does not have any smokeless tobacco history on file. He reports that he does not drink alcohol or use illicit drugs.  Allergies:  Allergies  Allergen Reactions  . Clindamycin Hcl     Wheezing and  erythema/itching at IV site 01/27/12  . Prednisone Rash    Medications Prior to Admission  Medication Sig Dispense Refill  . aspirin EC 81 MG tablet Take 81 mg by mouth daily.      . calcium-vitamin D (OSCAL WITH D) 500-200 MG-UNIT per tablet Take 1 tablet by mouth 2 (two) times daily.      . carvedilol (COREG) 25 MG tablet Take 25 mg by mouth 2 (two) times daily with a meal.       . colchicine 0.6 MG tablet Take 0.6 mg by mouth daily as needed. For gout      . insulin NPH-insulin regular (NOVOLIN 70/30) (70-30) 100 UNIT/ML injection Inject 26-78 Units into the skin See admin instructions. Inject 78 units in the morning and 26 units at night      . levothyroxine (SYNTHROID, LEVOTHROID) 25 MCG tablet Take 25 mcg by mouth daily.        . Multiple Vitamin (MULTIVITAMIN) tablet Take 1 tablet by mouth daily.        . mycophenolate (CELLCEPT) 500 MG tablet Take 1,500 mg by mouth 2 (two) times daily.       Marland Kitchen oxyCODONE-acetaminophen (ROXICET) 5-325 MG per tablet Take 1 tablet by mouth every 4 (four) hours as needed for pain.  30 tablet  0  . pravastatin (PRAVACHOL) 20 MG tablet Take 20 mg by mouth daily.        . predniSONE (DELTASONE) 10 MG tablet Take 10 mg by mouth daily.      Marland Kitchen REVATIO 20 MG tablet TAKE THREE TABLETS BY MOUTH EVERY 8 HOURS  270 each  2  . tacrolimus (PROGRAF) 1 MG capsule Take 6 mg by mouth 2 (two) times daily.       Marland Kitchen torsemide (DEMADEX) 20 MG tablet TAKE TWO TABLETS BY MOUTH TWICE DAILY  120 tablet  5    Results for orders placed during the hospital encounter of 11/26/12 (from the past 48 hour(s))  POCT HEMOGLOBIN-HEMACUE     Status: Abnormal   Collection Time   11/26/12 11:28 AM      Component Value Range Comment   Hemoglobin 10.7 (*) 13.0 - 17.0 g/dL     No results found.   Pertinent items are noted in HPI.  Blood pressure 158/126, pulse 90, temperature 97.9 F (36.6 C), temperature source Oral, resp. rate 20, height 5\' 4"  (1.626 m), weight 96.707 kg (213 lb 3.2 oz),  SpO2 100.00%.  General appearance: alert, cooperative and appears stated age Head: Normocephalic, without obvious abnormality Neck: no adenopathy Resp: clear to auscultation bilaterally Cardio: regular rate and rhythm, S1, S2 normal, no murmur, click, rub or gallop GI: soft,  non-tender; bowel sounds normal; no masses,  no organomegaly Extremities: extremities normal, atraumatic, no cyanosis or edema Pulses: 2+ and symmetric Skin: Skin color, texture, turgor normal. No rashes or lesions Neurologic: Grossly normal Incision/Wound: na  Assessment/Plan Gout with destruction DIP RSF The pre, peri and post op course are discussed along with risks and complications.  He is aware there is no guarantee with surgery, possibility of infection, recurrence, injury to arteries, nerves and tendons, incomplete relief of symptoms and dystrophy  Avagrace Botelho R 11/26/2012, 12:37 PM

## 2012-11-26 NOTE — Op Note (Signed)
Dictation Number [id number]

## 2012-11-26 NOTE — Anesthesia Preprocedure Evaluation (Signed)
Anesthesia Evaluation  Patient identified by MRN, date of birth, ID band Patient awake    Reviewed: Allergy & Precautions, H&P , NPO status , Patient's Chart, lab work & pertinent test results, reviewed documented beta blocker date and time   History of Anesthesia Complications Negative for: history of anesthetic complications  Airway Mallampati: III TM Distance: >3 FB Neck ROM: Full    Dental No notable dental hx. (+) Teeth Intact and Dental Advisory Given   Pulmonary sleep apnea and Continuous Positive Airway Pressure Ventilation , former smoker,  breath sounds clear to auscultation  Pulmonary exam normal       Cardiovascular hypertension, Pt. on medications and Pt. on home beta blockers +CHF + dysrhythmias (h/o VT) Rhythm:Regular Rate:Normal  S/p heart transplant '12 for severe BiV heart failure. Post op ECHO with some persistent RV dysfunction, normal systolic LV function   Neuro/Psych negative neurological ROS  negative psych ROS   GI/Hepatic negative GI ROS, Neg liver ROS,   Endo/Other  diabetes (glu 226), Well Controlled, Type 2, Insulin DependentHypothyroidism Morbid obesity  Renal/GU Renal InsufficiencyRenal disease (creat 2.11)     Musculoskeletal   Abdominal (+) + obese,   Peds  Hematology   Anesthesia Other Findings   Reproductive/Obstetrics                           Anesthesia Physical Anesthesia Plan  ASA: IV  Anesthesia Plan: MAC and Bier Block   Post-op Pain Management:    Induction:   Airway Management Planned: Natural Airway and Simple Face Mask  Additional Equipment:   Intra-op Plan:   Post-operative Plan:   Informed Consent: I have reviewed the patients History and Physical, chart, labs and discussed the procedure including the risks, benefits and alternatives for the proposed anesthesia with the patient or authorized representative who has indicated his/her  understanding and acceptance.   Dental advisory given  Plan Discussed with: CRNA and Surgeon  Anesthesia Plan Comments: (Plan routine monitors, IV Regional Lidocaine)        Anesthesia Quick Evaluation

## 2012-11-26 NOTE — Transfer of Care (Signed)
Immediate Anesthesia Transfer of Care Note  Patient: David Gregory  Procedure(s) Performed: Procedure(s) (LRB) with comments: ARTHRODESIS FINGER (Right) - EXCISION TOPHUS FUSION DIP (Right Little Finger)   Patient Location: PACU  Anesthesia Type:Bier block  Level of Consciousness: awake, sedated and patient cooperative  Airway & Oxygen Therapy: Patient Spontanous Breathing and Patient connected to face mask oxygen  Post-op Assessment: Report given to PACU RN and Post -op Vital signs reviewed and stable  Post vital signs: Reviewed and stable  Complications: No apparent anesthesia complications

## 2012-11-26 NOTE — Addendum Note (Signed)
Addendum  created 11/26/12 1629 by Midge Minium, MD   Modules edited:Notes Section

## 2012-11-26 NOTE — Brief Op Note (Signed)
11/26/2012  2:50 PM  PATIENT:  Suzanna Obey  47 y.o. male  PRE-OPERATIVE DIAGNOSIS:  GOUT RLF DIP  POST-OPERATIVE DIAGNOSIS:  * No post-op diagnosis entered *  PROCEDURE:  Procedure(s) (LRB) with comments: ARTHRODESIS FINGER (Right) - EXCISION TOPHUS FUSION DIP (Right Little Finger)  SURGEON:  Surgeon(s) and Role:    * Wynonia Sours, MD - Primary  PHYSICIAN ASSISTANT:   ASSISTANTS: none   ANESTHESIA:   local and regional  EBL:     BLOOD ADMINISTERED:none  DRAINS: none   LOCAL MEDICATIONS USED:  MARCAINE   And xylocaine  SPECIMEN:  No Specimen  DISPOSITION OF SPECIMEN:  N/A  COUNTS:  YES  TOURNIQUET:   Total Tourniquet Time Documented: Upper Arm (Right) - 43 minutes  DICTATION: .Other Dictation: Dictation Number [id number]  PLAN OF CARE: Discharge to home after PACU  PATIENT DISPOSITION:  PACU - hemodynamically stable.

## 2012-11-26 NOTE — Discharge Instructions (Addendum)

## 2012-11-26 NOTE — Anesthesia Postprocedure Evaluation (Addendum)
  Anesthesia Post-op Note  Patient: David Gregory  Procedure(s) Performed: Procedure(s) (LRB) with comments: ARTHRODESIS FINGER (Right) - EXCISION TOPHUS FUSION DIP (Right Little Finger)   Patient Location: PACU  Anesthesia Type:MAC and Bier block  Level of Consciousness: awake, alert , oriented and patient cooperative  Airway and Oxygen Therapy: Patient Spontanous Breathing  Post-op Pain: none  Post-op Assessment: Post-op Vital signs reviewed, Patient's Cardiovascular Status Stable, Respiratory Function Stable, Patent Airway, No signs of Nausea or vomiting, Adequate PO intake and Pain level controlled  Post-op Vital Signs: Reviewed and stable  Complications: No apparent anesthesia complications   Patient understands to take his usual home medicines ASAP, and to use CPAP when drowsy

## 2012-11-29 NOTE — Op Note (Signed)
David Gregory, David Gregory               ACCOUNT NO.:  [id number]  MEDICAL RECORD NO.:  [id number]  LOCATION:                                 FACILITY:  PHYSICIAN:  Daryll Brod, M.D.       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  OPERATION:  Excision of tophus with allograft and fusion of DIP joint, right small finger.  SURGEON:  Daryll Brod, M.D.  ANESTHESIA:  Upper arm IV regional with metacarpal block.  ANESTHESIOLOGIST:  Midge Minium, M.D.  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  He has destruction of the distal interphalangeal joint of his right little finger.  He is admitted for excision of large tophus, fusion of the distal interphalangeal joint, bone graft.  Pre, peri and postoperative course have been discussed along with risks and complications.  He is aware that there is no guarantee with surgery; possibility of infection; recurrence of injury to arteries, nerves, tendons; incomplete relief of symptoms and dystrophy.  In the preoperative area, the patient is seen, the extremity marked by both the patient and surgeon, and antibiotic given.  PROCEDURE:  The patient was brought to the operating room where an upper arm IV regional anesthetic was carried out without difficulty.  He was prepped using ChloraPrep, supine position, right arm free.  A 3-minute dry time was allowed.  Time-out taken, confirming the patient and procedure.  Metacarpal block was given with 0.25% Marcaine, 1% Xylocaine both without epinephrine and approximately 8 mL was used.  A curvilinear incision was made over the distal interphalangeal joint, carried down through  the subcutaneous tissue.  A large tophus was immediately encountered with total destruction of the distal interphalangeal joint, portions of the distal phalanx, distal portion of the middle phalanx. The entire tophus was removed.  The joint was then debrided.  This left a gap, which was irrigated and filled with cancellous chips.  Two 3.5 K- wires were then placed for fusion of the joint, these were in a cross manner, placed in an antegrade manner and then retrograde into the middle phalanx stabilizing the distal interphalangeal joint.  X-rays confirmed positioning in good position of the DIP joint and full extension with bone graft in good position.  The extensor tendon was then repaired with figure-of-eight 4-0 Vicryl suture and the skin with interrupted 4-0 Vicryl Rapide sutures.  The pins were bent, cut short, protruding through the skin.  A sterile compressive dressing and splint to the finger was applied.  On deflation of the tourniquet, remaining fingers were pinked.  He was taken to the recovery room for observation in satisfactory condition. He will be discharged to home to return to the [facility] in 1 week, on Vicodin and Keflex.          ______________________________ Daryll Brod, M.D.     GK/MEDQ  D:  11/26/2012  T:  11/26/2012  Job:  [id number]

## 2012-11-30 ENCOUNTER — Encounter (HOSPITAL_BASED_OUTPATIENT_CLINIC_OR_DEPARTMENT_OTHER): Payer: Self-pay | Admitting: Orthopedic Surgery

## 2012-11-30 LAB — GLUCOSE, CAPILLARY
Glucose-Capillary: 226 mg/dL — ABNORMAL HIGH (ref 70–99)
Glucose-Capillary: 170 mg/dL — ABNORMAL HIGH (ref 70–99)
Glucose-Capillary: 166 mg/dL — ABNORMAL HIGH (ref 70–99)

## 2012-12-07 ENCOUNTER — Ambulatory Visit: Payer: Medicare Other | Admitting: Pulmonary Disease

## 2012-12-22 ENCOUNTER — Ambulatory Visit (HOSPITAL_COMMUNITY): Payer: Medicare Other | Attending: Internal Medicine

## 2012-12-24 ENCOUNTER — Encounter: Payer: Self-pay | Admitting: Pulmonary Disease

## 2012-12-24 ENCOUNTER — Ambulatory Visit (INDEPENDENT_AMBULATORY_CARE_PROVIDER_SITE_OTHER): Payer: Medicare Other | Admitting: Pulmonary Disease

## 2012-12-24 VITALS — BP 160/98 | HR 108 | Temp 98.6°F | Ht 64.0 in | Wt 211.0 lb

## 2012-12-24 DIAGNOSIS — G4733 Obstructive sleep apnea (adult) (pediatric): Secondary | ICD-10-CM

## 2012-12-24 NOTE — Patient Instructions (Signed)
Follow-up in one year.

## 2012-12-24 NOTE — Progress Notes (Signed)
Chief Complaint  Patient presents with  . Sleep Apnea    Currently using CPAP every night for at least 5 hours. Denies any problems with the machine, mask or pressure.    History of Present Illness: David Gregory is a 47 y.o. male with OSA, OHS using CPAP and 1 liter oxygen.  He is doing well with CPAP.  He continues to use oxygen at night.  He goes to bed at 6 pm, and wakes up at 4 am.  He has a full face mask.  He feels CPAP helps, and he uses at least 5 hours per night.  TESTS: PSG 04/27/12 >> AHI 139.5, SpO2 low 61%.  CPAP 17 cm H2O.  PLMI 0 CPAP 07/01/12 to 07/30/12>>Used on 20 of 30 nights with average 5 hrs 43 min. Average AHI 2 with CPAP 17 cm H2O. ONO with CPAP and RA 07/07/12>>Test time 4 hr 59 min.  Mean SpO2 91%, low SpO2 83%.  Spent 44 min with SpO2 < 89% 08/10/12 Change to CPAP 15 cm H2O  Past Medical History  Diagnosis Date  . VENTRICULAR TACHYCARDIA   . SYSTOLIC HEART FAILURE, CHRONIC   . SYSTOLIC HEART FAILURE, ACUTE ON CHRONIC   . CONGESTIVE HEART FAILURE UNSPECIFIED   . Gout, unspecified   . ERECTILE DYSFUNCTION, ORGANIC   . Edema   . Diarrhea   . CHEST PAIN UNSPECIFIED   . Hypertension   . Renal insufficiency     pt reports levels have been at 4.8  . Blood transfusion     with heart transplant  . Diabetes mellitus   . Hypothyroidism   . Carpal tunnel syndrome     Right  . Arthritis     "Every where"    Past Surgical History  Procedure Date  . Heart transplant feb/ 2012    Done at [facility] followed by Dr. Stann Mainland  . Fracture surgery     bilateral arms and l ankle fixed  . Eye surgery     "styl removed"  . Mass excision 01/27/2012    Procedure: EXCISION MASS;  Surgeon: Mcarthur Rossetti, MD;  Location: [place];  Service: Orthopedics;  Laterality: Left;  Excision left middle finger mass  . Carpal tunnel release 09/06/2012    Procedure: CARPAL TUNNEL RELEASE;  Surgeon: Wynonia Sours, MD;  Location: [place];  Service: Orthopedics;   Laterality: Right;  FOREARM BLOCK  . Finger arthrodesis 11/26/2012    Procedure: ARTHRODESIS FINGER;  Surgeon: Wynonia Sours, MD;  Location: [place];  Service: Orthopedics;  Laterality: Right;  EXCISION TOPHUS FUSION DIP (Right Little Finger)     Outpatient Encounter Prescriptions as of 12/24/2012  Medication Sig Dispense Refill  . aspirin EC 81 MG tablet Take 81 mg by mouth daily.      . calcium-vitamin D (OSCAL WITH D) 500-200 MG-UNIT per tablet Take 1 tablet by mouth 2 (two) times daily.      . carvedilol (COREG) 25 MG tablet Take 25 mg by mouth 2 (two) times daily with a meal.       . cephALEXin (KEFLEX) 250 MG capsule Take 1 capsule (250 mg total) by mouth 4 (four) times daily.  28 capsule  0  . colchicine 0.6 MG tablet Take 0.6 mg by mouth daily as needed. For gout      . HYDROcodone-acetaminophen (NORCO) 5-325 MG per tablet Take 1 tablet by mouth every 6 (six) hours as needed for pain.  30 tablet  0  . insulin NPH-insulin regular (NOVOLIN 70/30) (70-30) 100 UNIT/ML injection Inject 26-78 Units into the skin See admin instructions. Inject 78 units in the morning and 26 units at night      . levothyroxine (SYNTHROID, LEVOTHROID) 25 MCG tablet Take 25 mcg by mouth daily.        . Multiple Vitamin (MULTIVITAMIN) tablet Take 1 tablet by mouth daily.        . mycophenolate (CELLCEPT) 500 MG tablet Take 1,500 mg by mouth 2 (two) times daily.       Marland Kitchen oxyCODONE-acetaminophen (ROXICET) 5-325 MG per tablet Take 1 tablet by mouth every 4 (four) hours as needed for pain.  30 tablet  0  . pravastatin (PRAVACHOL) 20 MG tablet Take 20 mg by mouth daily.        . predniSONE (DELTASONE) 10 MG tablet Take 10 mg by mouth daily.      . sildenafil (REVATIO) 20 MG tablet       . tacrolimus (PROGRAF) 1 MG capsule Take 6 mg by mouth 2 (two) times daily.       Marland Kitchen torsemide (DEMADEX) 20 MG tablet TAKE TWO TABLETS BY MOUTH TWICE DAILY  120 tablet  5  . [DISCONTINUED] REVATIO 20 MG tablet TAKE THREE  TABLETS BY MOUTH EVERY 8 HOURS  270 each  2    Allergies  Allergen Reactions  . Clindamycin Hcl     Wheezing and erythema/itching at IV site 01/27/12  . Prednisone Rash    Physical Exam:  Filed Vitals:   12/24/12 1434  BP: 160/98  Pulse: 108  Temp: 98.6 F (37 C)  TempSrc: Oral  Height: 5\' 4"  (1.626 m)  Weight: 211 lb (95.709 kg)  SpO2: 97%     Current Encounter SPO2  12/24/12 1434 97%  11/26/12 1600 98%  11/26/12 1545 97%  11/26/12 1530 100%  11/26/12 1515 100%  11/26/12 1500 100%  11/26/12 1112 100%  11/26/12 1600 98%  11/26/12 1545 97%  11/26/12 1530 100%  11/26/12 1515 100%  11/26/12 1500 100%  11/26/12 1112 100%     Body mass index is 36.22 kg/(m^2).   Wt Readings from Last 2 Encounters:  12/24/12 211 lb (95.709 kg)  11/26/12 213 lb 3.2 oz (96.707 kg)     General - No distress ENT - No sinus tenderness, MP 4, scalloped tongue border, no oral exudate, no LAN Cardiac - s1s2 regular, no murmur Chest - No wheeze/rales/dullness Back - No focal tenderness Abd - Soft, non-tender Ext - No edema Neuro - Normal strength Skin - No rashes Psych - normal mood, and behavior   Assessment/Plan:  David Mires, MD [place] [facility]/Critical Care/Sleep Pager:  [phone] 12/24/2012, 2:37 PM

## 2012-12-24 NOTE — Assessment & Plan Note (Signed)
Stable on his regimen of CPAP 15 cm H2O with 1 liter oxygen.  He is compliant with therapy and reports benefit from CPAP.

## 2012-12-31 IMAGING — CR DG CHEST 2V
2 series · 2 of 2 positions shown · non-contrast
Comparison: Chest x-ray 07/30/2011.

CLINICAL DATA: Preoperative study prior to the excision of the left
middle finger mass.

CHEST - 2 VIEW

[view not recorded (1 of 2)]
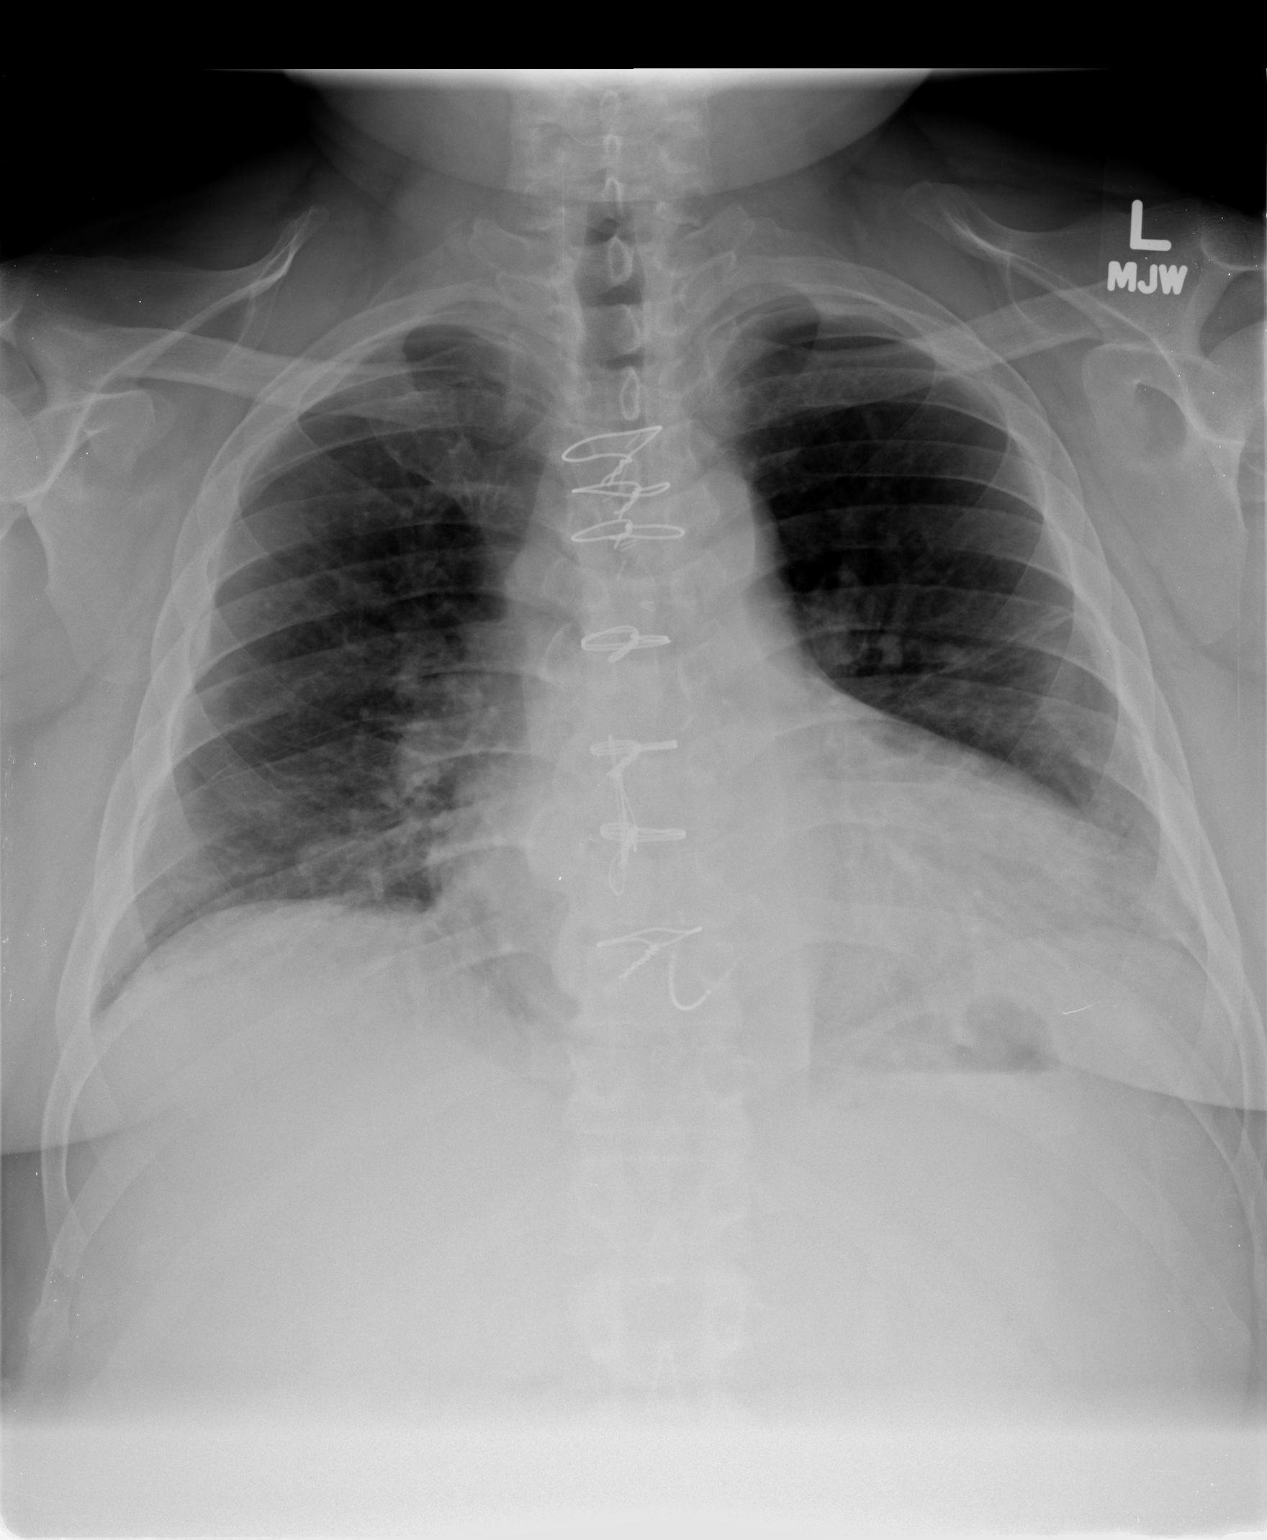

[view not recorded (2 of 2)]
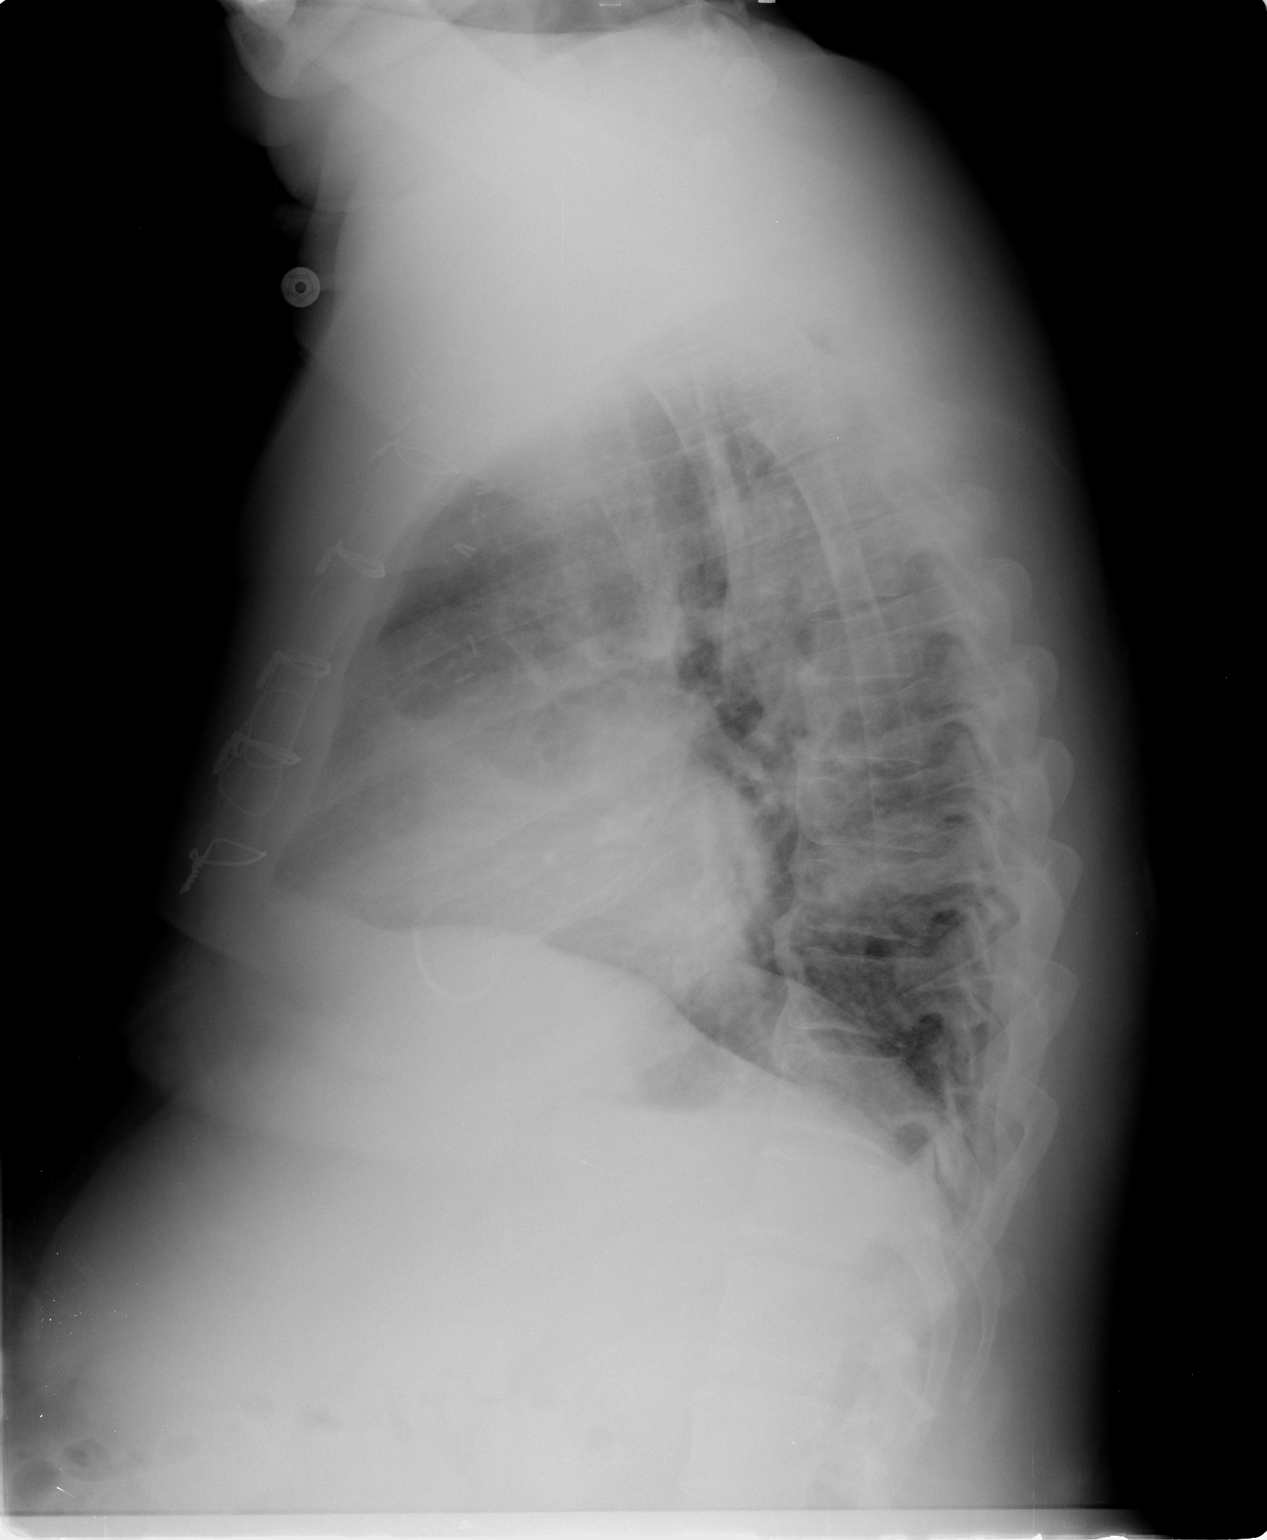

[2 of 2 positions shown; findings below may reference images not displayed]

FINDINGS: Lung volumes are low.  No consolidative airspace disease.
No definite pleural effusions.  Heart size is mildly enlarged
(unchanged), with dilatation of the left atrium.  Pulmonary
vasculature appears mildly engorged (likely accentuated by low lung
volumes), but there is no evidence of frank pulmonary edema.
Postoperative changes of mitral annuloplasty are noted.  Patient is
status post median sternotomy.  Mediastinal contours are
unremarkable.
IMPRESSION: 1.  Low lung volumes without definite radiographic evidence of
acute cardiopulmonary disease.
2.  Cardiomegaly with left atrial dilatation.  There is pulmonary
venous congestion, without frank pulmonary edema.
3.  Postoperative changes, as above.

## 2013-01-10 ENCOUNTER — Encounter (HOSPITAL_COMMUNITY): Payer: Self-pay

## 2013-01-10 ENCOUNTER — Ambulatory Visit (HOSPITAL_COMMUNITY)
Admission: RE | Admit: 2013-01-10 | Discharge: 2013-01-10 | Disposition: A | Discharge status: Home / Self Care | Payer: Medicare Other | Source: Ambulatory Visit | Attending: Internal Medicine | Admitting: Internal Medicine

## 2013-01-10 VITALS — BP 146/84 | HR 88 | Wt 211.8 lb

## 2013-01-10 DIAGNOSIS — M109 Gout, unspecified: Secondary | ICD-10-CM | POA: Insufficient documentation

## 2013-01-10 DIAGNOSIS — I5022 Chronic systolic (congestive) heart failure: Secondary | ICD-10-CM | POA: Insufficient documentation

## 2013-01-10 DIAGNOSIS — G4733 Obstructive sleep apnea (adult) (pediatric): Secondary | ICD-10-CM | POA: Insufficient documentation

## 2013-01-10 NOTE — Assessment & Plan Note (Signed)
NYHA II. Volume status stable. Continue current diuretic regimen. Follow up at [facility] next month for biopsy.  Follow up 4 months with Dr Haroldine Laws.

## 2013-01-10 NOTE — Progress Notes (Signed)
Patient ID: David Gregory, male   DOB: Apr 22, 1966, 47 y.o.   MRN: [id number] Pulmonolgist: Dr Halford Chessman Nephrology:  Cardiologist at [facility]:  Dr Gilles Chiquito Orthopedic Surgeon: Dr Ninfa Linden, Dr Fredna Dow  Rheumatolgist: Dr Annamaria Boots  HPI: Lord is a 47 year old male with h/o CHF due to nonischemic cardiomyopathy  EF 15-25% s/p BiVICD.  He also has a history of HTN, DM, CRI, previous tobacco and alcohol use (now abstinent since 12/10). Underwent OHTx in March 2012.   Post-op course c/b by RH failure with associated renal failure requiring an RVAD and TV ring annuloplasty. Has had mild rejection in June but all subsequent biopsies OK.   RHC at [facility] in May 2012 RA 12 PA 53/30 (36) PCW 13 Echo at [facility] 8/16: LVEF normal RV moderate HK. TV ring with mean gradient 7. No TR  Has been struggling with volume overload and predominantly RHF.  He was admitted on 9/4 for ultrafiltration.  He diuresed ~7 kg but with elevated Cr of 3.25 ultrafiltration was stopped and he was transitioned to oral demadex.  Revatio increased to 60 mg TID.    09/11/2011 [facility] RHC RA 27 RV 61/17 PA 61/23 (44) PCWP 23 V= 26 CI 3.5 PVR 2.8  10/02/2011 Creatinine 2.98  Potassium 4.0  10/17/2011 Creatinine 2.86 Potassium 4.3 11/04/2011   Creatinine 3.3 Potassium 3.9 11/20/2011 Creatinine 2.9 Potassium 3.9 01/09/2012 Creatinine 4.45 BUN 81 Potassium 3.8 11/22/2012 Creatinine 2.11 BUN 42 Potassium 4.4   He returns for follow up.  Denies SOB/PND/Orthopnea/ CP. He has limited activity due to gout pain. Recently started on Uloric by Dr Annamaria Boots (rheumatologist). Weight at home 210-215. He has not required any extra Torsemide. Continues to uses CPAP. Follow up at [facility] in March for follow biopsy.    ROS: All systems negative except as listed in HPI, PMH and Problem List.  Past Medical History  Diagnosis Date  . VENTRICULAR TACHYCARDIA   . SYSTOLIC HEART FAILURE, CHRONIC   . SYSTOLIC HEART FAILURE, ACUTE ON CHRONIC   . CONGESTIVE HEART FAILURE  UNSPECIFIED   . Gout, unspecified   . ERECTILE DYSFUNCTION, ORGANIC   . Edema   . Diarrhea   . CHEST PAIN UNSPECIFIED   . Hypertension   . Renal insufficiency     pt reports levels have been at 4.8  . Blood transfusion     with heart transplant  . Diabetes mellitus   . Hypothyroidism   . Carpal tunnel syndrome     Right  . Arthritis     "Every where"    Current Outpatient Prescriptions  Medication Sig Dispense Refill  . aspirin EC 81 MG tablet Take 81 mg by mouth daily.      . calcium-vitamin D (OSCAL WITH D) 500-200 MG-UNIT per tablet Take 1 tablet by mouth 2 (two) times daily.      . carvedilol (COREG) 25 MG tablet Take 25 mg by mouth 2 (two) times daily with a meal.       . cephALEXin (KEFLEX) 250 MG capsule Take 1 capsule (250 mg total) by mouth 4 (four) times daily.  28 capsule  0  . colchicine 0.6 MG tablet Take 0.6 mg by mouth daily as needed. For gout      . febuxostat (ULORIC) 40 MG tablet Take 80 mg by mouth daily.      Marland Kitchen HYDROcodone-acetaminophen (NORCO) 5-325 MG per tablet Take 1 tablet by mouth every 6 (six) hours as needed for pain.  30 tablet  0  . insulin  NPH-insulin regular (NOVOLIN 70/30) (70-30) 100 UNIT/ML injection Inject 26-78 Units into the skin See admin instructions. Inject 78 units in the morning and 26 units at night      . levothyroxine (SYNTHROID, LEVOTHROID) 25 MCG tablet Take 25 mcg by mouth daily.        . Multiple Vitamin (MULTIVITAMIN) tablet Take 1 tablet by mouth daily.        . mycophenolate (CELLCEPT) 500 MG tablet Take 1,500 mg by mouth 2 (two) times daily.       Marland Kitchen oxyCODONE-acetaminophen (ROXICET) 5-325 MG per tablet Take 1 tablet by mouth every 4 (four) hours as needed for pain.  30 tablet  0  . pravastatin (PRAVACHOL) 20 MG tablet Take 20 mg by mouth daily.        . predniSONE (DELTASONE) 10 MG tablet Take 10 mg by mouth daily.      . sildenafil (REVATIO) 20 MG tablet       . tacrolimus (PROGRAF) 1 MG capsule Take 6 mg by mouth 2 (two)  times daily.       Marland Kitchen torsemide (DEMADEX) 20 MG tablet        No current facility-administered medications for this encounter.     PHYSICAL EXAM: Filed Vitals:   01/10/13 1001  BP: 146/84  Pulse: 88  Wt 211 pounds  General:  Well appearing. No resp difficulty HEENT: normal Neck: supple. JVP 5-6 Carotids 2+ bilaterally; no bruits. No lymphadenopathy or thryomegaly appreciated. Cor: PMI normal. Regular rate & rhythm.  S3 Lungs: clear Abdomen: obese. Mildly distended  Non-tender. No bruits or masses. Good bowel sounds. Extremities: no cyanosis, clubbing, rash. No lower extremity  traceedema  Neuro: alert & orientedx3, cranial nerves grossly intact. Moves all 4 extremities w/o difficulty. Affect pleasant    ASSESSMENT & PLAN:

## 2013-01-10 NOTE — Assessment & Plan Note (Signed)
Continue CPAP nightly. °

## 2013-01-10 NOTE — Assessment & Plan Note (Signed)
Continue Uloric per rheumatology.

## 2013-01-10 NOTE — Patient Instructions (Addendum)
Follow up in 4 months with Dr Haroldine Laws  Do the following things EVERYDAY: 1) Weigh yourself in the morning before breakfast. Write it down and keep it in a log. 2) Take your medicines as prescribed 3) Eat low salt foods-Limit salt (sodium) to 2000 mg per day.  4) Stay as active as you can everyday 5) Limit all fluids for the day to less than 2 liters

## 2013-07-15 ENCOUNTER — Encounter: Payer: Self-pay | Admitting: Adult Health

## 2013-07-15 ENCOUNTER — Ambulatory Visit (INDEPENDENT_AMBULATORY_CARE_PROVIDER_SITE_OTHER): Payer: Medicare Other | Admitting: Adult Health

## 2013-07-15 VITALS — BP 140/100 | HR 87 | Temp 98.7°F | Ht 64.0 in | Wt 207.8 lb

## 2013-07-15 DIAGNOSIS — G4733 Obstructive sleep apnea (adult) (pediatric): Secondary | ICD-10-CM

## 2013-07-15 NOTE — Progress Notes (Signed)
Reviewed and agree with assessment/plan. 

## 2013-07-15 NOTE — Patient Instructions (Addendum)
Continue on CPAP At bedtime   We will set up for ONO on CPAP -RA for DME O2 recert.  follow up Dr. Halford Chessman  As planned

## 2013-07-15 NOTE — Progress Notes (Signed)
  Subjective:    Patient ID: Suzanna Obey, male    DOB: 20-Mar-1966, 47 y.o.   MRN: [id number]  HPI 47 yo male with known hx of OSA and OHS  On nocturnal CPAP  Hx of Heart Transplant 12/2010   07/15/2013 Follow up  Returns for a routine followup for his sleep apnea. He is on nocturnal CPAP with oxygen at night. 4 nocturnal desaturations despite CPAP, support. He says that he wears his CPAP every night consistently on average 6 hours. He needs any oxygen recertification through his DME company. He'll be set up for an overnight oximetry test to measure his oxygen level on room air with his CPAP. He says that he is doing well on CPAP and has no complaints.   TESTS: PSG 04/27/12 >> AHI 139.5, SpO2 low 61%.  CPAP 17 cm H2O.  PLMI 0 CPAP 07/01/12 to 07/30/12>>Used on 20 of 30 nights with average 5 hrs 43 min. Average AHI 2 with CPAP 17 cm H2O. ONO with CPAP and RA 07/07/12>>Test time 4 hr 59 min.  Mean SpO2 91%, low SpO2 83%.  Spent 44 min with SpO2 < 89% 08/10/12 Change to CPAP 15 cm H2O   Review of Systems     Objective:   Physical Exam        Assessment & Plan:

## 2013-07-15 NOTE — Assessment & Plan Note (Signed)
Continue on CPAP with oxygen for nocturnal desaturations with CPAP. We'll check a overnight oximetry level on room air with CPAP for her DME certification for oxygen. He is to return back as planned with Dr. Halford Chessman  and as needed

## 2013-12-15 ENCOUNTER — Telehealth: Payer: Self-pay | Admitting: Pulmonary Disease

## 2013-12-15 NOTE — Telephone Encounter (Signed)
Called pt and made him aware of recs. He reports he will call back if he wants [place] to schedule him for this. Nothing further needed

## 2013-12-15 NOTE — Telephone Encounter (Signed)
I am not aware of him having a pulmonary condition that would preclude him from being able to perform these tests with DMV.  Before signing forms he would need to have office evaluation with PFT and Chest xray to re-assess his pulmonary status.

## 2013-12-15 NOTE — Telephone Encounter (Signed)
Called and spoke with pt. He reports he is trying to get his Driver's License. Pt reports DMV wants to do a test that is similar to a "breathalizer" test. One test he would have to blow for 4 secs and the 2nd test he would have to blow for 10 seconds. He reports he could do the test blowing for 4 ses but not the second test. He has a form that would excuse him from this if Dr. Halford Chessman would fill it out. He was last seen  12/24/12 by Dr. Halford Chessman. No pending appt. Please advise Dr. Halford Chessman thanks

## 2013-12-20 ENCOUNTER — Telehealth: Payer: Self-pay | Admitting: Pulmonary Disease

## 2013-12-20 NOTE — Telephone Encounter (Signed)
Spoke with pt and he is trying to get his drivers license back.  They want to put the interlock system on his car but pt states he is unable to blow in this.  He needs to have a spirometry from two  different doctors.  PT wants to know if VS could do this.  Please advise.

## 2013-12-20 NOTE — Telephone Encounter (Signed)
Pt has been scheduled to come in on 12/22/13 at 2:30 for simple spiro.

## 2013-12-20 NOTE — Telephone Encounter (Signed)
He can come to office for spirometry.

## 2013-12-22 ENCOUNTER — Ambulatory Visit (INDEPENDENT_AMBULATORY_CARE_PROVIDER_SITE_OTHER): Payer: Medicare Other

## 2013-12-22 ENCOUNTER — Ambulatory Visit (INDEPENDENT_AMBULATORY_CARE_PROVIDER_SITE_OTHER): Payer: Medicare Other | Admitting: Pulmonary Disease

## 2013-12-22 ENCOUNTER — Other Ambulatory Visit: Payer: Self-pay | Admitting: Pulmonary Disease

## 2013-12-22 ENCOUNTER — Telehealth: Payer: Self-pay | Admitting: Pulmonary Disease

## 2013-12-22 DIAGNOSIS — G4733 Obstructive sleep apnea (adult) (pediatric): Secondary | ICD-10-CM

## 2013-12-22 DIAGNOSIS — R0602 Shortness of breath: Secondary | ICD-10-CM

## 2013-12-22 DIAGNOSIS — J984 Other disorders of lung: Secondary | ICD-10-CM

## 2013-12-22 NOTE — Progress Notes (Signed)
Spirometry done today. 

## 2013-12-22 NOTE — Telephone Encounter (Signed)
Spirometry 12/22/13 >> FEV1 1.39 (51%), FVC 1.69 (50%), FEV1% 83.  Will have my nurse inform pt that spirometry shows moderate restrictive defect (meaning that he can't expand his lungs normally).  He needs to have a 2 view PA/lateral chest xray to further assess this.  He can also leave any forms to be completed for his motor vehicle license application, and I will review these.

## 2013-12-23 NOTE — Telephone Encounter (Signed)
Kennyth Lose from dmv says she's faxing form over to get readings from pt's spirometry can be reached at [phone].Elnita Maxwell

## 2013-12-23 NOTE — Telephone Encounter (Signed)
lmtcb x1 

## 2013-12-23 NOTE — Telephone Encounter (Signed)
Pt is aware of spirometry results. Order has been placed for CXR. I will be on the look for the [facility] forms. Pt gave me the number to the lady who is taking care of his case. Gustavus Messing phone: [phone] fax: [phone]

## 2013-12-23 NOTE — Telephone Encounter (Signed)
Form has been received. The pt has to sign this form, he is now aware of this. VS will be in the office on 12/27/13. His portion will be filled out and signed on this day. Pt will come on 2/3 or 2/4 to sign this as well.  I will keep this message open to follow up on.

## 2013-12-28 ENCOUNTER — Telehealth: Payer: Self-pay | Admitting: Pulmonary Disease

## 2013-12-28 NOTE — Telephone Encounter (Signed)
Duplicate message on pt. See phone 12/23/13

## 2013-12-29 NOTE — Telephone Encounter (Signed)
Pt's paperwork is complete. The only thing that needs to be done before I can fax to the [facility], the pt needs to sign the form. I have left a message with him to come by the office to take care of this.  I will leave message open to follow up on.

## 2013-12-30 NOTE — Telephone Encounter (Signed)
Pt came by today. The form has been signed. This and his spirometry test have been faxed to the [facility]. Nothing further was needed at this time.

## 2014-01-03 ENCOUNTER — Inpatient Hospital Stay (HOSPITAL_COMMUNITY): Admission: RE | Admit: 2014-01-03 | Payer: Medicare Other | Source: Ambulatory Visit

## 2014-06-05 ENCOUNTER — Telehealth: Payer: Self-pay | Admitting: Pulmonary Disease

## 2014-06-05 NOTE — Telephone Encounter (Signed)
Received fax from [place].  They are requesting copy of sleep study from 04/27/12 be faxed to them for medicare chart review.  Fax # is [phone].  Will have my nurse fax results to [place].  He is also overdue for follow up appointment.  Please schedule him for next available ROV.

## 2014-06-06 NOTE — Telephone Encounter (Signed)
Documents have been faxed per [facility] request. Fax # [phone] [facility] x 1 for pt to call back for appt

## 2014-06-06 NOTE — Telephone Encounter (Signed)
Pt scheduled for appt with VS 06/16/14 at 3:45pm  -----  Pt wanting a copy of his co-pays paid in the last year-- expense report Will need to ask Daleen Snook 06/07/14 if this is something that we can print here in the office or if this is something he will need to contact billing office for. Pt aware that I will call him back once I find out this answer.

## 2014-06-07 NOTE — Telephone Encounter (Signed)
Pt made aware that he will have to contact [facility] billing dept for this report as we do not run these in our office.  Nothing further needed.

## 2014-06-16 ENCOUNTER — Encounter: Payer: Self-pay | Admitting: Pulmonary Disease

## 2014-06-16 ENCOUNTER — Ambulatory Visit (INDEPENDENT_AMBULATORY_CARE_PROVIDER_SITE_OTHER): Payer: Medicare Other | Admitting: Pulmonary Disease

## 2014-06-16 VITALS — BP 108/64 | HR 93 | Temp 97.9°F | Ht 64.0 in | Wt 204.6 lb

## 2014-06-16 DIAGNOSIS — G4733 Obstructive sleep apnea (adult) (pediatric): Secondary | ICD-10-CM

## 2014-06-16 DIAGNOSIS — E662 Morbid (severe) obesity with alveolar hypoventilation: Secondary | ICD-10-CM

## 2014-06-16 DIAGNOSIS — Z9989 Dependence on other enabling machines and devices: Principal | ICD-10-CM

## 2014-06-16 NOTE — Assessment & Plan Note (Signed)
He is to continue 1 liter oxygen at night with CPAP.

## 2014-06-16 NOTE — Patient Instructions (Signed)
Will get copy of CPAP report and call with results Follow up in 1 year 

## 2014-06-16 NOTE — Assessment & Plan Note (Signed)
He reports benefit from CPAP and compliance with therapy.  Will get his download and call him with results.

## 2014-06-16 NOTE — Progress Notes (Signed)
Chief Complaint  Patient presents with  . Follow-up    Pt reports good tolerance of CPAP. Denies issues with mask/pressure.     History of Present Illness: David Gregory is a 48 y.o. male with OSA/OHS.  He has been doing well with CPAP.  He reports using this every night.  He has full face mask.  He uses 1 liter oxygen at night also.  He is sleeping through the night and feels rested.  TESTS: PSG 04/27/12 >> AHI 139.5, SpO2 low 61%. CPAP 17 cm H2O. PLMI 0  CPAP 07/01/12 to 07/30/12>>Used on 20 of 30 nights with average 5 hrs 43 min. Average AHI 2 with CPAP 17 cm H2O.  ONO with CPAP and RA 07/07/12>>Test time 4 hr 59 min. Mean SpO2 91%, low SpO2 83%. Spent 44 min with SpO2 < 89%  08/10/12 Change to CPAP 15 cm H2O Spirometry 12/22/13 >> FEV1 1.39 (51%), FVC 1.69 (50%), FEV1% 83.   Past medical hx, Past surgical hx, Medications, Allergies, Family hx, Social hx all reviewed.   Physical Exam:  General - No distress ENT - No sinus tenderness, no oral exudate, no LAN, MP 4 Cardiac - s1s2 regular, no murmur Chest - No wheeze/rales/dullness Back - No focal tenderness Abd - Soft, non-tender Ext - No edema Neuro - Normal strength Skin - No rashes Psych - normal mood, and behavior   Assessment/Plan:  Chesley Mires, MD [place] Pulmonary/Critical Care/Sleep Pager:  [phone]

## 2014-06-24 LAB — PULMONARY FUNCTION TEST
FEF 25-75 Pre: 1.59 L/sec
FEF2575-%Pred-Pre: 55 %
FEV1-%Pred-Pre: 51 %
FEV1-Pre: 1.39 L
FEV1FVC-%Pred-Pre: 103 %
FEV6-%Pred-Pre: 51 %
FEV6-Pre: 1.69 L
FEV6FVC-%Pred-Pre: 103 %
FVC-%Pred-Pre: 50 %
FVC-Pre: 1.69 L
Pre FEV1/FVC ratio: 83 %
Pre FEV6/FVC Ratio: 100 %

## 2014-07-24 ENCOUNTER — Telehealth: Payer: Self-pay | Admitting: Pulmonary Disease

## 2014-07-24 NOTE — Telephone Encounter (Signed)
Spoke with pt--states that [facility] is requesting updated results of a Spirometry. Pt does not have a spirometry on file within the last 6 months.  Needs to be completed within 30 day window--needs done by/before 08/13/14 Form that needs to filled out of by Dr Halford Chessman as well.  Pt advised at last OV to follow up x 1 year-- 05/2015  Dr Halford Chessman, please advise if you would like this to be in a formal OV so that the test can be completed and forms filled out same day or if patient needs to drop off form and have [name] done during a nurse visit? Please advise Dr Halford Chessman. Thanks.

## 2014-07-25 NOTE — Telephone Encounter (Signed)
He just needs to come for spirometry.  He does not need ROV for this.

## 2014-07-27 NOTE — Telephone Encounter (Signed)
Pt returned call - [phone]

## 2014-07-27 NOTE — Telephone Encounter (Signed)
LMTC x 1  

## 2014-07-27 NOTE — Telephone Encounter (Signed)
I do not know if he needs post spirometry >> need to check his forms from [facility].  If he needs post spirometry, then needs to be done with Tammy.  Otherwise can be done with nurse.

## 2014-07-27 NOTE — Telephone Encounter (Signed)
Dr Halford Chessman does pt need Spirometry with Tammy or just simple office spirometry?  Does he needs pre and post?  Please advise

## 2014-07-27 NOTE — Telephone Encounter (Signed)
Called spoke with pt. He is going to drop off the forms tomorrow for [place] to review to see what type of spiro he needs to have done. Will await pt to come in

## 2014-07-28 NOTE — Telephone Encounter (Signed)
Dr. Halford Chessman please advise. Forms are in your look at.

## 2014-07-28 NOTE — Telephone Encounter (Signed)
Pt brought in forms i have put them in soods look at pt left his cell phone # [phone]

## 2014-08-01 NOTE — Telephone Encounter (Signed)
Pt states that he has already had one of the two spirometry's completed  Pt scheduled to have spiro in our office 9/10 at 9am On injection schedule.  Dr Halford Chessman has forms to be completed. Pt needs these back by Friday 08/04/14

## 2014-08-01 NOTE — Telephone Encounter (Signed)
Forms reviewed.  He only need spirometry >> no need for post-bronchodilator spirometry.  Please schedule this.  Please also inform pt that he needs to have this test done by separate doctors >> he will need to discuss with his PCP about arranging for second test session if he has not already done this.

## 2014-08-03 ENCOUNTER — Ambulatory Visit (INDEPENDENT_AMBULATORY_CARE_PROVIDER_SITE_OTHER): Payer: Medicare Other | Admitting: Pulmonary Disease

## 2014-08-03 DIAGNOSIS — I2789 Other specified pulmonary heart diseases: Secondary | ICD-10-CM

## 2014-08-03 NOTE — Telephone Encounter (Signed)
[name] completed this morning - results in EPIC and printed patient/DMV Pt left form that needs to be completed.  This is in my possession--needs to have this completed by 08/04/14

## 2014-08-03 NOTE — Progress Notes (Signed)
Spirometry completed today for [facility] for [employer].

## 2014-08-07 ENCOUNTER — Telehealth: Payer: Self-pay | Admitting: Emergency Medicine

## 2014-08-07 NOTE — Telephone Encounter (Signed)
Pt had dropped off forms from [facility] for VS to complete. VS completed the physician section but pt did not fill out his information nor sign the form. Called and spoke to pt. Informed him to drop by to fill out the required section and sign the form. Pt verbalized understanding and stated he would drop by on 9/15 to complete form. Pt aware to come to front and the form will be waiting for him. Forms given to [place].  Will forward to [place] because the form will need to be mailed directly from our office to the [facility].

## 2014-08-08 NOTE — Telephone Encounter (Addendum)
Virl Cagey, CMA at 08/08/2014 10:50 AM    Pt came by today and signed forms  Pt aware that forms have to be faxed by our office  Forms faxed to [phone]  Forms placed in scan folder to be scanned into patient chart Nothing further needed.

## 2014-08-08 NOTE — Telephone Encounter (Signed)
Has this been completed. Thanks.  

## 2014-08-08 NOTE — Telephone Encounter (Signed)
Pt came by and signed forms Pt aware that forms have to be faxed by our office  Forms faxed to [phone] Nothing further needed.

## 2014-08-09 NOTE — Telephone Encounter (Signed)
Called and LM with Gustavus Messing to return call. [phone] Want to ensure forms received.

## 2014-08-14 NOTE — Telephone Encounter (Addendum)
Spoke with Kennyth Lose at [employer] [facility]) Everything received and processed-- pt has been approved for the [employer] and spoke with pt--made aware Nothing further needed.   Forms placed to scan.

## 2015-02-08 ENCOUNTER — Telehealth: Payer: Self-pay | Admitting: Pulmonary Disease

## 2015-02-08 NOTE — Telephone Encounter (Signed)
Called and spoke to [place]. David Gregory requested sleep study interpretation from 2013. Faxed document to [phone]. David Gregory verbalized understanding and denied any further questions or concerns at this time.

## 2015-02-26 ENCOUNTER — Telehealth: Payer: Self-pay | Admitting: Pulmonary Disease

## 2015-02-26 NOTE — Telephone Encounter (Signed)
Spoke with pt.  States he needs to have spiro, with interpreted results, and form completed every 6 months for the [facility].  Pt states he had this done here 6 months ago.  He would like to schedule this - States it is due by March 12, 2015.    Last OV with Dr. Halford Chessman: 06/16/2014.  Pt was asked to f/u in 1 yr.   No pending appt. Per phone msg from 07/24/2014: pt needed simple spiro.  This was done on 08/03/14. Dr. Halford Chessman, please advise if you are ok with ordering spiro and completing forms for this.   Thank you.

## 2015-02-27 NOTE — Telephone Encounter (Signed)
He can come for spirometry.  He needs to schedule ROV also.

## 2015-02-27 NOTE — Telephone Encounter (Signed)
Spoke with pt, could not make only open VS appt between now and 4/18.  Scheduled pt for 4/8 with TP for spirometry and rov.  Nothing further needed.

## 2015-03-02 ENCOUNTER — Encounter: Payer: Self-pay | Admitting: Adult Health

## 2015-03-02 ENCOUNTER — Ambulatory Visit (INDEPENDENT_AMBULATORY_CARE_PROVIDER_SITE_OTHER): Payer: Medicare Other | Admitting: Adult Health

## 2015-03-02 VITALS — BP 116/86 | HR 94 | Temp 98.5°F | Ht 64.0 in | Wt 210.0 lb

## 2015-03-02 DIAGNOSIS — G4733 Obstructive sleep apnea (adult) (pediatric): Secondary | ICD-10-CM

## 2015-03-02 DIAGNOSIS — R0602 Shortness of breath: Secondary | ICD-10-CM

## 2015-03-02 NOTE — Assessment & Plan Note (Signed)
DMV paperwork completed.

## 2015-03-02 NOTE — Patient Instructions (Signed)
Follow up with Dr. Halford Chessman  As planned and As needed

## 2015-03-02 NOTE — Progress Notes (Signed)
   Subjective:    Patient ID: David Gregory, male    DOB: Dec 06, 1965, 49 y.o.   MRN: [id number]  HPI 49  yo male with known hx of OSA and OHS  On nocturnal CPAP /O2  Hx of Heart Transplant 12/2010   03/02/2015 Brief visit : DMV paperwork /[employer]ition [employer] Waiver Form  Pt with spirometry today >no sign change in moderate restrictive lung disease  FEV1 59%, ratio 83, FVC 58%.  Paperwork completed.  Declines exam today , paperwork only ov.    Review of Systems See HPI     Objective:   Physical Exam NAD  VSS        Assessment & Plan:

## 2015-03-12 ENCOUNTER — Telehealth: Payer: Self-pay | Admitting: Pulmonary Disease

## 2015-03-12 NOTE — Telephone Encounter (Signed)
Per TP's OV note, these forms were completed at her office visit. David Gregory were these faxed? Thanks.

## 2015-03-13 ENCOUNTER — Telehealth: Payer: Self-pay | Admitting: Adult Health

## 2015-03-13 NOTE — Telephone Encounter (Signed)
Pt in lobby wanting a copy of Spirometry report and is also wanting [place] to refax everything because they have not received all the document.  Forms refaxed while pt in office. Copies given. Nothing further needed.

## 2015-03-13 NOTE — Telephone Encounter (Signed)
Pt in lobby requesting to speak to nurse about forms

## 2015-03-13 NOTE — Telephone Encounter (Signed)
Spoke with Jess who faxed these forms to the [facility] on 4/13.  Pt came in stating that DMV did not receive the forms and requested a copy of the form Jess signed.  Gave pt a copy of the form.  Nothing further needed.

## 2015-04-26 ENCOUNTER — Ambulatory Visit: Payer: Medicare Other | Admitting: Pulmonary Disease

## 2015-07-09 ENCOUNTER — Ambulatory Visit (INDEPENDENT_AMBULATORY_CARE_PROVIDER_SITE_OTHER): Payer: Medicare Other | Admitting: Pulmonary Disease

## 2015-07-09 ENCOUNTER — Encounter: Payer: Self-pay | Admitting: Pulmonary Disease

## 2015-07-09 VITALS — BP 124/84 | HR 92 | Ht 64.0 in | Wt 216.0 lb

## 2015-07-09 DIAGNOSIS — G4733 Obstructive sleep apnea (adult) (pediatric): Secondary | ICD-10-CM

## 2015-07-09 DIAGNOSIS — E662 Morbid (severe) obesity with alveolar hypoventilation: Secondary | ICD-10-CM

## 2015-07-09 DIAGNOSIS — J984 Other disorders of lung: Secondary | ICD-10-CM | POA: Diagnosis not present

## 2015-07-09 NOTE — Progress Notes (Signed)
Chief Complaint  Patient presents with  . Follow-up    Using CPAP nightly x 7 hours. Denies any problems with mask/pressure.     History of Present Illness: David Gregory is a 49 y.o. male with OSA/OHS.  He has been doing with CPAP.  He is using 1 liter oxygen at night with CPAP.  He has full face mask, and no issue with mask fit.  He gets about 8.5 hrs sleep per night.  He feels rested during the day.   TESTS: PSG 04/27/12 >> AHI 139.5, SpO2 low 61%. CPAP 17 cm H2O. PLMI 0  CPAP 07/01/12 to 07/30/12>>Used on 20 of 30 nights with average 5 hrs 43 min. Average AHI 2 with CPAP 17 cm H2O.  ONO with CPAP and RA 07/07/12>>Test time 4 hr 59 min. Mean SpO2 91%, low SpO2 83%. Spent 44 min with SpO2 < 89%  08/10/12 Change to CPAP 15 cm H2O Spirometry 12/22/13 >> FEV1 1.39 (51%), FVC 1.69 (50%), FEV1% 83. CPAP 06/08/15 to 07/07/15 >> used on 30 of 30 nights with average 8 hrs and 44 min.  Average AHI is 2.3 with CPAP 15 cm H2O.   Past medical hx >> Chronic systolic heart failure s/p cardiac transplant 2012 followed at [facility] [place], HTN, CKD stage 4 followed at [facility], DM, Hypothyroidism, Gout  Past surgical hx, Medications, Allergies, Family hx, Social hx all reviewed.   Physical Exam: BP 124/84 mmHg  Pulse 92  Ht 5\' 4"  (1.626 m)  Wt 216 lb (97.977 kg)  BMI 37.06 kg/m2  SpO2 96%  General - No distress ENT - No sinus tenderness, no oral exudate, no LAN, MP 4 Cardiac - s1s2 regular, no murmur Chest - No wheeze/rales/dullness Back - No focal tenderness Abd - Soft, non-tender Ext - No edema Neuro - Normal strength Skin - No rashes Psych - normal mood, and behavior   Assessment/Plan:  Obstructive sleep apnea. Plan: - continue CPAP 15 cm H2O  Obesity hypoventilation syndrome. Plan: - continue 1 liter oxygen at night with CPAP  Restrictive lung disease. Plan: - he will need repeat spirometry at next visit for DMV clearance >> he gets 2nd spirometry at [facility]  Obesity. Plan: -  discussed importance of weight loss   Chesley Mires, MD [place] [facility]/Critical Care/Sleep Pager:  [phone]

## 2015-07-09 NOTE — Patient Instructions (Signed)
Follow up in 2 months with spirometry

## 2015-09-17 ENCOUNTER — Ambulatory Visit: Payer: Medicare Other | Admitting: Pulmonary Disease

## 2015-11-30 ENCOUNTER — Ambulatory Visit (HOSPITAL_COMMUNITY)
Admission: RE | Admit: 2015-11-30 | Discharge: 2015-11-30 | Disposition: A | Discharge status: Home / Self Care | Payer: Medicare Other | Source: Ambulatory Visit | Attending: Internal Medicine | Admitting: Internal Medicine

## 2015-11-30 VITALS — BP 140/98 | HR 100 | Wt 221.0 lb

## 2015-11-30 DIAGNOSIS — M109 Gout, unspecified: Secondary | ICD-10-CM | POA: Insufficient documentation

## 2015-11-30 DIAGNOSIS — I429 Cardiomyopathy, unspecified: Secondary | ICD-10-CM | POA: Insufficient documentation

## 2015-11-30 DIAGNOSIS — I5022 Chronic systolic (congestive) heart failure: Secondary | ICD-10-CM

## 2015-11-30 DIAGNOSIS — Z941 Heart transplant status: Secondary | ICD-10-CM | POA: Insufficient documentation

## 2015-11-30 DIAGNOSIS — Z7982 Long term (current) use of aspirin: Secondary | ICD-10-CM | POA: Insufficient documentation

## 2015-11-30 DIAGNOSIS — N183 Chronic kidney disease, stage 3 (moderate): Secondary | ICD-10-CM | POA: Diagnosis not present

## 2015-11-30 DIAGNOSIS — I129 Hypertensive chronic kidney disease with stage 1 through stage 4 chronic kidney disease, or unspecified chronic kidney disease: Secondary | ICD-10-CM | POA: Diagnosis not present

## 2015-11-30 DIAGNOSIS — Z794 Long term (current) use of insulin: Secondary | ICD-10-CM | POA: Diagnosis not present

## 2015-11-30 DIAGNOSIS — G4733 Obstructive sleep apnea (adult) (pediatric): Secondary | ICD-10-CM | POA: Insufficient documentation

## 2015-11-30 DIAGNOSIS — I272 Other secondary pulmonary hypertension: Secondary | ICD-10-CM | POA: Insufficient documentation

## 2015-11-30 DIAGNOSIS — Z79899 Other long term (current) drug therapy: Secondary | ICD-10-CM | POA: Diagnosis not present

## 2015-11-30 DIAGNOSIS — N189 Chronic kidney disease, unspecified: Secondary | ICD-10-CM | POA: Insufficient documentation

## 2015-11-30 DIAGNOSIS — Z7952 Long term (current) use of systemic steroids: Secondary | ICD-10-CM | POA: Insufficient documentation

## 2015-11-30 DIAGNOSIS — E119 Type 2 diabetes mellitus without complications: Secondary | ICD-10-CM | POA: Diagnosis not present

## 2015-11-30 DIAGNOSIS — I2721 Secondary pulmonary arterial hypertension: Secondary | ICD-10-CM

## 2015-11-30 NOTE — Progress Notes (Signed)
Patient ID: David Gregory, male   DOB: 07/09/1966, 50 y.o.   MRN: [id number] Pulmonolgist: Dr Halford Chessman Nephrology:  Cardiologist at [facility]:  Dr Gilles Chiquito Orthopedic Surgeon: Dr Ninfa Linden, Dr Fredna Dow  Rheumatolgist: Dr Annamaria Boots  HPI: David Gregory is a 50 year old male with h/o systolic HF due to nonischemic cardiomyopathy  EF 15-25% s/p BiVICD. Underwent OHTx in March 201 at [facility]   He also has a history of HTN, DM, CKD (creatinine ~ 2.8), previous tobacco and alcohol use (now abstinent since 12/10).    Post-op course c/b by RH failure with associated renal failure requiring an RVAD and TV ring annuloplasty.   He returns for HF follow up. Denies SOB/Orthopnea/PND. Not weighing at home. He continues on torsemide 20 mg daily. Does not need extra diuretics. Taking all medications. He has follow up [facility] February 2017. Has not had any rejection. States that he has occasional SOB since Revatio stopped.   RHC at [facility] in May 2012 RA 12 PA 53/30 (36) PCW 13 Echo at [facility] 8/16: LVEF normal RV moderate HK. TV ring with mean gradient 7. No TR   09/11/2011 [facility] RHC RA 27 RV 61/17 PA 61/23 (44) PCWP 23 V= 26 CI 3.5 PVR 2.8  10/02/2011 Creatinine 2.98  Potassium 4.0  10/17/2011 Creatinine 2.86 Potassium 4.3 11/04/2011   Creatinine 3.3 Potassium 3.9 11/20/2011 Creatinine 2.9 Potassium 3.9 01/09/2012 Creatinine 4.45 BUN 81 Potassium 3.8 11/22/2012 Creatinine 2.11 BUN 42 Potassium 4.4   ROS: All systems negative except as listed in HPI, PMH and Problem List.  Past Medical History  Diagnosis Date  . VENTRICULAR TACHYCARDIA   . SYSTOLIC HEART FAILURE, CHRONIC   . SYSTOLIC HEART FAILURE, ACUTE ON CHRONIC   . CONGESTIVE HEART FAILURE UNSPECIFIED   . Gout, unspecified   . ERECTILE DYSFUNCTION, ORGANIC   . Edema   . Diarrhea   . CHEST PAIN UNSPECIFIED   . Hypertension   . Renal insufficiency     pt reports levels have been at 4.8  . Blood transfusion     with heart transplant  . Diabetes mellitus   . Hypothyroidism    . Carpal tunnel syndrome     Right  . Arthritis     "Every where"    Current Outpatient Prescriptions  Medication Sig Dispense Refill  . aspirin EC 81 MG tablet Take 81 mg by mouth daily.    . calcium-vitamin D (OSCAL WITH D) 500-200 MG-UNIT per tablet Take 1 tablet by mouth 2 (two) times daily.    . carvedilol (COREG) 25 MG tablet Take 25 mg by mouth 2 (two) times daily with a meal.     . colchicine 0.6 MG tablet Take 0.6 mg by mouth daily as needed. For gout    . febuxostat (ULORIC) 40 MG tablet Take 80 mg by mouth daily.    Marland Kitchen HYDROcodone-acetaminophen (NORCO) 5-325 MG per tablet Take 1 tablet by mouth every 6 (six) hours as needed for pain. 30 tablet 0  . insulin NPH-insulin regular (NOVOLIN 70/30) (70-30) 100 UNIT/ML injection Inject 26-78 Units into the skin See admin instructions. Inject 78 units in the morning and 26 units at night    . levothyroxine (SYNTHROID, LEVOTHROID) 25 MCG tablet Take 25 mcg by mouth daily.      . Multiple Vitamin (MULTIVITAMIN) tablet Take 1 tablet by mouth daily.      . mycophenolate (CELLCEPT) 500 MG tablet Take 1,500 mg by mouth 2 (two) times daily.     . pantoprazole (PROTONIX) 40  MG tablet Take 40 mg by mouth daily.    . pravastatin (PRAVACHOL) 20 MG tablet Take 20 mg by mouth daily.      . predniSONE (DELTASONE) 10 MG tablet Take 5 mg by mouth daily.     . tacrolimus (PROGRAF) 1 MG capsule Take 6 mg by mouth 2 (two) times daily.     Marland Kitchen torsemide (DEMADEX) 20 MG tablet Take 20 mg by mouth daily.     . VOLTAREN 1 % GEL 3 (three) times daily.     No current facility-administered medications for this encounter.     PHYSICAL EXAM: Filed Vitals:   11/30/15 1400  BP: 140/98  Pulse: 100   General:  Well appearing. No resp difficulty HEENT: normal Neck: supple. JVP 5-6 Carotids 2+ bilaterally; no bruits. No lymphadenopathy or thryomegaly appreciated. Cor: PMI normal. Regular rate & rhythm.  Lungs: clear Abdomen: obese. Mildly distended   Non-tender. No bruits or masses. Good bowel sounds. Extremities: no cyanosis, clubbing, rash. No lower extremity. No edema  Neuro: alert & orientedx3, cranial nerves grossly intact. Moves all 4 extremities w/o difficulty. Affect pleasant  ASSESSMENT & PLAN:  1. Chronic Systolic Heart Failure -s/p OHTx 2012    --c/b RHF and PAH requiring TV ring 2. OSA 3. CKD stage III-IV 4. Gout   Overall doing well. No significant HF symptoms. Notes that he has occasional dyspnea after coming off Revatio. Can restart as needed. Renal function followed at [facility]. Continue current therapy.  Bensimhon, Daniel,MD 2:51 PM

## 2015-12-27 ENCOUNTER — Encounter: Payer: Self-pay | Admitting: Pulmonary Disease

## 2015-12-27 ENCOUNTER — Ambulatory Visit (INDEPENDENT_AMBULATORY_CARE_PROVIDER_SITE_OTHER): Payer: Medicare Other | Admitting: Pulmonary Disease

## 2015-12-27 VITALS — BP 142/92 | HR 102 | Ht 64.0 in | Wt 222.0 lb

## 2015-12-27 DIAGNOSIS — G4733 Obstructive sleep apnea (adult) (pediatric): Secondary | ICD-10-CM

## 2015-12-27 DIAGNOSIS — Z9989 Dependence on other enabling machines and devices: Principal | ICD-10-CM

## 2015-12-27 NOTE — Progress Notes (Signed)
Current Outpatient Prescriptions on File Prior to Visit  Medication Sig  . aspirin EC 81 MG tablet Take 81 mg by mouth daily.  . calcium-vitamin D (OSCAL WITH D) 500-200 MG-UNIT per tablet Take 1 tablet by mouth 2 (two) times daily.  . carvedilol (COREG) 25 MG tablet Take 25 mg by mouth 2 (two) times daily with a meal.   . colchicine 0.6 MG tablet Take 0.6 mg by mouth daily as needed. For gout  . febuxostat (ULORIC) 40 MG tablet Take 80 mg by mouth daily.  Marland Kitchen HYDROcodone-acetaminophen (NORCO) 5-325 MG per tablet Take 1 tablet by mouth every 6 (six) hours as needed for pain.  Marland Kitchen insulin NPH-insulin regular (NOVOLIN 70/30) (70-30) 100 UNIT/ML injection Inject 26-78 Units into the skin See admin instructions. Inject 78 units in the morning and 26 units at night  . levothyroxine (SYNTHROID, LEVOTHROID) 25 MCG tablet Take 25 mcg by mouth daily.    . Multiple Vitamin (MULTIVITAMIN) tablet Take 1 tablet by mouth daily.    . mycophenolate (CELLCEPT) 500 MG tablet Take 1,500 mg by mouth 2 (two) times daily.   . pantoprazole (PROTONIX) 40 MG tablet Take 40 mg by mouth daily.  . pravastatin (PRAVACHOL) 20 MG tablet Take 20 mg by mouth daily.    . predniSONE (DELTASONE) 10 MG tablet Take 5 mg by mouth daily.   . tacrolimus (PROGRAF) 1 MG capsule Take 6 mg by mouth 2 (two) times daily.   Marland Kitchen torsemide (DEMADEX) 20 MG tablet Take 20 mg by mouth daily.   . VOLTAREN 1 % GEL 3 (three) times daily.   No current facility-administered medications on file prior to visit.     Chief Complaint  Patient presents with  . Follow-up    Pt states that his CPAP is working well. Pt states that he wears the CPAP about 10 hours nightly. Pt denies daytime somnolence. Pt has not gotten any supplies recently due to a conflict with the DME. Pt would like a new DME.     Tests PSG 04/27/12 >> AHI 139.5, SpO2 low 61%. CPAP 17 cm H2O. PLMI 0  ONO with CPAP and RA 07/07/12>>Test time 4 hr 59 min. Mean SpO2 91%, low SpO2 83%.  Spent 44 min with SpO2 < 89%  08/10/12 Change to CPAP 15 cm H2O Spirometry 12/22/13 >> FEV1 1.39 (51%), FVC 1.69 (50%), FEV1% 83. CPAP 10/26/15 to 11/24/15 >> used on 29 of 30 nights with average 8 hrs and 13 min.  Average AHI is 1.8 with CPAP 15 cm H2O.   Past medical hx Chronic systolic heart failure s/p cardiac transplant 2012 followed at [facility] [place], HTN, CKD stage 4 followed at [facility], DM, Hypothyroidism, Gout  Past surgical hx, Allergies, Family hx, Social hx all reviewed.  Vital Signs BP 142/92 mmHg  Pulse 102  Ht 5\' 4"  (1.626 m)  Wt 222 lb (100.699 kg)  BMI 38.09 kg/m2  SpO2 96%  History of Present Illness DEANDREA BARIA is a 50 y.o. male with OSA, OHS, and restrictive lung disease.  He has been doing well with CPAP.  This helps his sleep.  He was getting financial assistance with CPAP >> his DME failed to renew this, and then started sending him bills which he could not afford.  He has not received new supplies since August 2016, and has his home oxygen set up removed.  Physical Exam  General - No distress ENT - No sinus tenderness, no oral exudate, no LAN, MP 3  Cardiac - s1s2 regular, no murmur Chest - No wheeze/rales/dullness Back - No focal tenderness Abd - Soft, non-tender Ext - No edema Neuro - Normal strength Skin - No rashes Psych - normal mood, and behavior   Assessment/Plan   Obstructive sleep apnea. Plan: - continue CPAP 15 cm H2O  Obesity hypoventilation syndrome. Plan: - will arrange for overnight oximetry with CPAP and room air  Restrictive lung disease. Plan: - he will call if he needs repeat spirometry for DMV clearance >> he gets 2nd spirometry at [facility]    Patient Instructions  Will arrange for overnight oximetry on CPAP  Will arrange for new CPAP supply company  Follow up in 1 year     Chesley Mires, MD [place] Pager:  [phone]

## 2015-12-27 NOTE — Patient Instructions (Signed)
Will arrange for overnight oximetry on CPAP  Will arrange for new CPAP supply company  Follow up in 1 year

## 2016-02-07 DIAGNOSIS — N289 Disorder of kidney and ureter, unspecified: Secondary | ICD-10-CM | POA: Diagnosis not present

## 2016-02-07 DIAGNOSIS — N184 Chronic kidney disease, stage 4 (severe): Secondary | ICD-10-CM | POA: Diagnosis not present

## 2016-02-07 DIAGNOSIS — I10 Essential (primary) hypertension: Secondary | ICD-10-CM | POA: Diagnosis not present

## 2016-02-25 DIAGNOSIS — M150 Primary generalized (osteo)arthritis: Secondary | ICD-10-CM | POA: Diagnosis not present

## 2016-02-25 DIAGNOSIS — M1A09X0 Idiopathic chronic gout, multiple sites, without tophus (tophi): Secondary | ICD-10-CM | POA: Diagnosis not present

## 2016-02-25 DIAGNOSIS — N183 Chronic kidney disease, stage 3 (moderate): Secondary | ICD-10-CM | POA: Diagnosis not present

## 2016-02-25 DIAGNOSIS — I509 Heart failure, unspecified: Secondary | ICD-10-CM | POA: Diagnosis not present

## 2016-02-25 DIAGNOSIS — M25522 Pain in left elbow: Secondary | ICD-10-CM | POA: Diagnosis not present

## 2016-04-03 DIAGNOSIS — R931 Abnormal findings on diagnostic imaging of heart and coronary circulation: Secondary | ICD-10-CM | POA: Diagnosis not present

## 2016-04-03 DIAGNOSIS — Z794 Long term (current) use of insulin: Secondary | ICD-10-CM | POA: Diagnosis not present

## 2016-04-03 DIAGNOSIS — E119 Type 2 diabetes mellitus without complications: Secondary | ICD-10-CM | POA: Diagnosis not present

## 2016-04-03 DIAGNOSIS — Z48298 Encounter for aftercare following other organ transplant: Secondary | ICD-10-CM | POA: Diagnosis not present

## 2016-04-03 DIAGNOSIS — Z941 Heart transplant status: Secondary | ICD-10-CM | POA: Diagnosis not present

## 2016-04-03 DIAGNOSIS — I517 Cardiomegaly: Secondary | ICD-10-CM | POA: Diagnosis not present

## 2016-04-03 DIAGNOSIS — Z4821 Encounter for aftercare following heart transplant: Secondary | ICD-10-CM | POA: Diagnosis not present

## 2016-04-03 DIAGNOSIS — N183 Chronic kidney disease, stage 3 (moderate): Secondary | ICD-10-CM | POA: Diagnosis not present

## 2016-04-03 DIAGNOSIS — Z792 Long term (current) use of antibiotics: Secondary | ICD-10-CM | POA: Diagnosis not present

## 2016-04-03 DIAGNOSIS — I130 Hypertensive heart and chronic kidney disease with heart failure and stage 1 through stage 4 chronic kidney disease, or unspecified chronic kidney disease: Secondary | ICD-10-CM | POA: Diagnosis not present

## 2016-04-03 DIAGNOSIS — I5189 Other ill-defined heart diseases: Secondary | ICD-10-CM | POA: Diagnosis not present

## 2016-04-03 DIAGNOSIS — Z79899 Other long term (current) drug therapy: Secondary | ICD-10-CM | POA: Diagnosis not present

## 2016-04-03 DIAGNOSIS — Z7982 Long term (current) use of aspirin: Secondary | ICD-10-CM | POA: Diagnosis not present

## 2016-04-03 DIAGNOSIS — I509 Heart failure, unspecified: Secondary | ICD-10-CM | POA: Diagnosis not present

## 2016-04-03 DIAGNOSIS — I519 Heart disease, unspecified: Secondary | ICD-10-CM | POA: Diagnosis not present

## 2016-04-03 DIAGNOSIS — E785 Hyperlipidemia, unspecified: Secondary | ICD-10-CM | POA: Diagnosis not present

## 2016-04-03 DIAGNOSIS — Z952 Presence of prosthetic heart valve: Secondary | ICD-10-CM | POA: Diagnosis not present

## 2016-04-04 DIAGNOSIS — N184 Chronic kidney disease, stage 4 (severe): Secondary | ICD-10-CM | POA: Diagnosis not present

## 2016-04-04 DIAGNOSIS — I10 Essential (primary) hypertension: Secondary | ICD-10-CM | POA: Diagnosis not present

## 2016-04-04 DIAGNOSIS — E118 Type 2 diabetes mellitus with unspecified complications: Secondary | ICD-10-CM | POA: Diagnosis not present

## 2016-04-04 DIAGNOSIS — I5022 Chronic systolic (congestive) heart failure: Secondary | ICD-10-CM | POA: Diagnosis not present

## 2016-04-04 DIAGNOSIS — E1165 Type 2 diabetes mellitus with hyperglycemia: Secondary | ICD-10-CM | POA: Diagnosis not present

## 2016-04-04 DIAGNOSIS — Z794 Long term (current) use of insulin: Secondary | ICD-10-CM | POA: Diagnosis not present

## 2016-04-04 DIAGNOSIS — E039 Hypothyroidism, unspecified: Secondary | ICD-10-CM | POA: Diagnosis not present

## 2016-08-14 DIAGNOSIS — E1122 Type 2 diabetes mellitus with diabetic chronic kidney disease: Secondary | ICD-10-CM | POA: Diagnosis not present

## 2016-08-14 DIAGNOSIS — I10 Essential (primary) hypertension: Secondary | ICD-10-CM | POA: Diagnosis not present

## 2016-08-14 DIAGNOSIS — Z794 Long term (current) use of insulin: Secondary | ICD-10-CM | POA: Diagnosis not present

## 2016-08-14 DIAGNOSIS — N183 Chronic kidney disease, stage 3 (moderate): Secondary | ICD-10-CM | POA: Diagnosis not present

## 2016-08-14 DIAGNOSIS — N184 Chronic kidney disease, stage 4 (severe): Secondary | ICD-10-CM | POA: Diagnosis not present

## 2016-08-14 DIAGNOSIS — Z23 Encounter for immunization: Secondary | ICD-10-CM | POA: Diagnosis not present

## 2016-08-14 DIAGNOSIS — I129 Hypertensive chronic kidney disease with stage 1 through stage 4 chronic kidney disease, or unspecified chronic kidney disease: Secondary | ICD-10-CM | POA: Diagnosis not present

## 2016-08-26 DIAGNOSIS — I509 Heart failure, unspecified: Secondary | ICD-10-CM | POA: Diagnosis not present

## 2016-08-26 DIAGNOSIS — N183 Chronic kidney disease, stage 3 (moderate): Secondary | ICD-10-CM | POA: Diagnosis not present

## 2016-08-26 DIAGNOSIS — M1A09X0 Idiopathic chronic gout, multiple sites, without tophus (tophi): Secondary | ICD-10-CM | POA: Diagnosis not present

## 2016-08-26 DIAGNOSIS — M25522 Pain in left elbow: Secondary | ICD-10-CM | POA: Diagnosis not present

## 2016-08-26 DIAGNOSIS — M150 Primary generalized (osteo)arthritis: Secondary | ICD-10-CM | POA: Diagnosis not present

## 2016-11-12 DIAGNOSIS — Z794 Long term (current) use of insulin: Secondary | ICD-10-CM | POA: Diagnosis not present

## 2016-11-12 DIAGNOSIS — Z79899 Other long term (current) drug therapy: Secondary | ICD-10-CM | POA: Diagnosis not present

## 2016-11-12 DIAGNOSIS — N184 Chronic kidney disease, stage 4 (severe): Secondary | ICD-10-CM | POA: Diagnosis not present

## 2016-11-12 DIAGNOSIS — E118 Type 2 diabetes mellitus with unspecified complications: Secondary | ICD-10-CM | POA: Diagnosis not present

## 2016-11-12 DIAGNOSIS — I10 Essential (primary) hypertension: Secondary | ICD-10-CM | POA: Diagnosis not present

## 2016-11-12 DIAGNOSIS — E1165 Type 2 diabetes mellitus with hyperglycemia: Secondary | ICD-10-CM | POA: Diagnosis not present

## 2016-11-12 DIAGNOSIS — I5022 Chronic systolic (congestive) heart failure: Secondary | ICD-10-CM | POA: Diagnosis not present

## 2016-11-12 DIAGNOSIS — E039 Hypothyroidism, unspecified: Secondary | ICD-10-CM | POA: Diagnosis not present

## 2016-11-12 DIAGNOSIS — E782 Mixed hyperlipidemia: Secondary | ICD-10-CM | POA: Diagnosis not present

## 2016-11-20 DIAGNOSIS — I10 Essential (primary) hypertension: Secondary | ICD-10-CM | POA: Diagnosis not present

## 2016-11-20 DIAGNOSIS — N184 Chronic kidney disease, stage 4 (severe): Secondary | ICD-10-CM | POA: Diagnosis not present

## 2016-11-26 DIAGNOSIS — Z6837 Body mass index (BMI) 37.0-37.9, adult: Secondary | ICD-10-CM | POA: Diagnosis not present

## 2016-11-26 DIAGNOSIS — I509 Heart failure, unspecified: Secondary | ICD-10-CM | POA: Diagnosis not present

## 2016-11-26 DIAGNOSIS — E669 Obesity, unspecified: Secondary | ICD-10-CM | POA: Diagnosis not present

## 2016-11-26 DIAGNOSIS — M1A09X0 Idiopathic chronic gout, multiple sites, without tophus (tophi): Secondary | ICD-10-CM | POA: Diagnosis not present

## 2016-11-26 DIAGNOSIS — M150 Primary generalized (osteo)arthritis: Secondary | ICD-10-CM | POA: Diagnosis not present

## 2016-11-26 DIAGNOSIS — N183 Chronic kidney disease, stage 3 (moderate): Secondary | ICD-10-CM | POA: Diagnosis not present

## 2016-11-26 DIAGNOSIS — M25522 Pain in left elbow: Secondary | ICD-10-CM | POA: Diagnosis not present

## 2017-01-16 ENCOUNTER — Ambulatory Visit (INDEPENDENT_AMBULATORY_CARE_PROVIDER_SITE_OTHER): Payer: Medicare Other | Admitting: Pulmonary Disease

## 2017-01-16 ENCOUNTER — Encounter: Payer: Self-pay | Admitting: Pulmonary Disease

## 2017-01-16 VITALS — BP 122/82 | HR 87 | Ht 64.0 in | Wt 219.4 lb

## 2017-01-16 DIAGNOSIS — Z9989 Dependence on other enabling machines and devices: Secondary | ICD-10-CM

## 2017-01-16 DIAGNOSIS — G4733 Obstructive sleep apnea (adult) (pediatric): Secondary | ICD-10-CM | POA: Diagnosis not present

## 2017-01-16 NOTE — Progress Notes (Signed)
Current Outpatient Prescriptions on File Prior to Visit  Medication Sig  . aspirin EC 81 MG tablet Take 81 mg by mouth daily.  . calcium-vitamin D (OSCAL WITH D) 500-200 MG-UNIT per tablet Take 1 tablet by mouth 2 (two) times daily.  . carvedilol (COREG) 25 MG tablet Take 25 mg by mouth 2 (two) times daily with a meal.   . colchicine 0.6 MG tablet Take 0.6 mg by mouth daily as needed. For gout  . febuxostat (ULORIC) 40 MG tablet Take 80 mg by mouth daily.  Marland Kitchen HYDROcodone-acetaminophen (NORCO) 5-325 MG per tablet Take 1 tablet by mouth every 6 (six) hours as needed for pain.  Marland Kitchen insulin NPH-insulin regular (NOVOLIN 70/30) (70-30) 100 UNIT/ML injection Inject 26-78 Units into the skin See admin instructions. Inject 78 units in the morning and 26 units at night  . levothyroxine (SYNTHROID, LEVOTHROID) 25 MCG tablet Take 25 mcg by mouth daily.    . Multiple Vitamin (MULTIVITAMIN) tablet Take 1 tablet by mouth daily.    . mycophenolate (CELLCEPT) 500 MG tablet Take 1,500 mg by mouth 2 (two) times daily.   . pantoprazole (PROTONIX) 40 MG tablet Take 40 mg by mouth daily.  . pravastatin (PRAVACHOL) 20 MG tablet Take 20 mg by mouth daily.    . predniSONE (DELTASONE) 10 MG tablet Take 5 mg by mouth daily.   . tacrolimus (PROGRAF) 1 MG capsule Take 6 mg by mouth 2 (two) times daily.   Marland Kitchen torsemide (DEMADEX) 20 MG tablet Take 20 mg by mouth daily.   . VOLTAREN 1 % GEL 3 (three) times daily.   No current facility-administered medications on file prior to visit.      Chief Complaint  Patient presents with  . Follow-up    Wears CPAP nightly. Denies problems with mask/pressure. DME: [facility]     Sleep tests PSG 04/27/12 >> AHI 139.5, SpO2 low 61%. CPAP 17 cm H2O. PLMI 0  ONO with CPAP and RA 07/07/12>>Test time 4 hr 59 min. Mean SpO2 91%, low SpO2 83%. Spent 44 min with SpO2 < 89%  08/10/12 Change to CPAP 15 cm H2O CPAP 10/26/15 to 11/24/15 >> used on 29 of 30 nights with average 8 hrs and 13 min.   Average AHI is 1.8 with CPAP 15 cm H2O.  Pulmonary tests Spirometry 12/22/13 >> FEV1 1.39 (51%), FEV1% 83 Spirometry 03/02/15 >> FEV1 1.67 (59%), FEV1% 83  Cardiac tests Echo 04/03/16 >> mild LVH, EF 55%  Past medical history Chronic systolic heart failure s/p cardiac transplant 2012 followed at [facility] [place], HTN, CKD stage 4 followed at [facility], DM, Hypothyroidism, Gout  Past surgical history, Family history, Social history, Allergies reviewed  Vital Signs BP 122/82 (BP Location: Left Arm, Cuff Size: Normal)   Pulse 87   Ht 5\' 4"  (1.626 m)   Wt 219 lb 6.4 oz (99.5 kg)   SpO2 96%   BMI 37.66 kg/m   History of Present Illness David Gregory is a 51 y.o. male with obstructive sleep apnea.  He has ben doing well with CPAP.  Uses nightly.  Has full face mask, and no issues with mask fit.  He reports no longer needing spirometry for DMV.  Physical Exam  General - pleasant ENT - no sinus tenderness, no oral exudate, MP 4, enlarged tongue Cardiac - regular, no murmur Chest - no wheeze/rales Back - no tenderness Abd - soft, non tender Ext - no edema Neuro - normal strength Skin - no rashes Psych -  normal mood   Assessment/Plan  Obstructive sleep apnea. - he reports compliance with CPAP and improvement with therapy - continue CPAP 15 cm H2O - will get copy of his download   Patient Instructions  Will get copy of CPAP report  Follow up in 1 year   Chesley Mires, MD [place] Pulmonary/Critical Care/Sleep Pager:  [phone] 01/16/2017, 10:05 AM

## 2017-01-16 NOTE — Patient Instructions (Signed)
Will get copy of CPAP report  Follow up in 1 year

## 2017-02-24 DIAGNOSIS — M25522 Pain in left elbow: Secondary | ICD-10-CM | POA: Diagnosis not present

## 2017-02-24 DIAGNOSIS — I509 Heart failure, unspecified: Secondary | ICD-10-CM | POA: Diagnosis not present

## 2017-02-24 DIAGNOSIS — M150 Primary generalized (osteo)arthritis: Secondary | ICD-10-CM | POA: Diagnosis not present

## 2017-02-24 DIAGNOSIS — N183 Chronic kidney disease, stage 3 (moderate): Secondary | ICD-10-CM | POA: Diagnosis not present

## 2017-02-24 DIAGNOSIS — E669 Obesity, unspecified: Secondary | ICD-10-CM | POA: Diagnosis not present

## 2017-02-24 DIAGNOSIS — M25531 Pain in right wrist: Secondary | ICD-10-CM | POA: Diagnosis not present

## 2017-02-24 DIAGNOSIS — Z6838 Body mass index (BMI) 38.0-38.9, adult: Secondary | ICD-10-CM | POA: Diagnosis not present

## 2017-02-24 DIAGNOSIS — M1A09X0 Idiopathic chronic gout, multiple sites, without tophus (tophi): Secondary | ICD-10-CM | POA: Diagnosis not present

## 2017-02-26 DIAGNOSIS — N183 Chronic kidney disease, stage 3 (moderate): Secondary | ICD-10-CM | POA: Diagnosis not present

## 2017-02-26 DIAGNOSIS — I10 Essential (primary) hypertension: Secondary | ICD-10-CM | POA: Diagnosis not present

## 2017-02-26 DIAGNOSIS — Z125 Encounter for screening for malignant neoplasm of prostate: Secondary | ICD-10-CM | POA: Diagnosis not present

## 2017-02-26 DIAGNOSIS — E669 Obesity, unspecified: Secondary | ICD-10-CM | POA: Diagnosis not present

## 2017-02-26 DIAGNOSIS — Z4821 Encounter for aftercare following heart transplant: Secondary | ICD-10-CM | POA: Diagnosis not present

## 2017-02-26 DIAGNOSIS — R0600 Dyspnea, unspecified: Secondary | ICD-10-CM | POA: Diagnosis not present

## 2017-02-26 DIAGNOSIS — Z23 Encounter for immunization: Secondary | ICD-10-CM | POA: Diagnosis not present

## 2017-02-26 DIAGNOSIS — Z941 Heart transplant status: Secondary | ICD-10-CM | POA: Diagnosis not present

## 2017-02-26 DIAGNOSIS — Z79899 Other long term (current) drug therapy: Secondary | ICD-10-CM | POA: Diagnosis not present

## 2017-03-25 DIAGNOSIS — H1851 Endothelial corneal dystrophy: Secondary | ICD-10-CM | POA: Diagnosis not present

## 2017-03-25 DIAGNOSIS — H40013 Open angle with borderline findings, low risk, bilateral: Secondary | ICD-10-CM | POA: Diagnosis not present

## 2017-03-25 DIAGNOSIS — H25813 Combined forms of age-related cataract, bilateral: Secondary | ICD-10-CM | POA: Diagnosis not present

## 2017-03-25 DIAGNOSIS — E119 Type 2 diabetes mellitus without complications: Secondary | ICD-10-CM | POA: Diagnosis not present

## 2017-03-26 DIAGNOSIS — I517 Cardiomegaly: Secondary | ICD-10-CM | POA: Diagnosis not present

## 2017-03-26 DIAGNOSIS — I129 Hypertensive chronic kidney disease with stage 1 through stage 4 chronic kidney disease, or unspecified chronic kidney disease: Secondary | ICD-10-CM | POA: Diagnosis not present

## 2017-03-26 DIAGNOSIS — I10 Essential (primary) hypertension: Secondary | ICD-10-CM | POA: Diagnosis not present

## 2017-03-26 DIAGNOSIS — N189 Chronic kidney disease, unspecified: Secondary | ICD-10-CM | POA: Diagnosis not present

## 2017-03-26 DIAGNOSIS — I519 Heart disease, unspecified: Secondary | ICD-10-CM | POA: Diagnosis not present

## 2017-03-26 DIAGNOSIS — Z4821 Encounter for aftercare following heart transplant: Secondary | ICD-10-CM | POA: Diagnosis not present

## 2017-03-26 DIAGNOSIS — N183 Chronic kidney disease, stage 3 (moderate): Secondary | ICD-10-CM | POA: Diagnosis not present

## 2017-04-25 DIAGNOSIS — J069 Acute upper respiratory infection, unspecified: Secondary | ICD-10-CM | POA: Diagnosis not present

## 2017-05-13 DIAGNOSIS — I10 Essential (primary) hypertension: Secondary | ICD-10-CM | POA: Diagnosis not present

## 2017-05-13 DIAGNOSIS — N184 Chronic kidney disease, stage 4 (severe): Secondary | ICD-10-CM | POA: Diagnosis not present

## 2017-05-13 DIAGNOSIS — Z794 Long term (current) use of insulin: Secondary | ICD-10-CM | POA: Diagnosis not present

## 2017-05-13 DIAGNOSIS — I509 Heart failure, unspecified: Secondary | ICD-10-CM | POA: Diagnosis not present

## 2017-05-13 DIAGNOSIS — Z0000 Encounter for general adult medical examination without abnormal findings: Secondary | ICD-10-CM | POA: Diagnosis not present

## 2017-05-13 DIAGNOSIS — E039 Hypothyroidism, unspecified: Secondary | ICD-10-CM | POA: Diagnosis not present

## 2017-05-13 DIAGNOSIS — E782 Mixed hyperlipidemia: Secondary | ICD-10-CM | POA: Diagnosis not present

## 2017-05-13 DIAGNOSIS — E1165 Type 2 diabetes mellitus with hyperglycemia: Secondary | ICD-10-CM | POA: Diagnosis not present

## 2017-05-13 DIAGNOSIS — J0140 Acute pansinusitis, unspecified: Secondary | ICD-10-CM | POA: Diagnosis not present

## 2017-05-13 DIAGNOSIS — R05 Cough: Secondary | ICD-10-CM | POA: Diagnosis not present

## 2017-05-13 DIAGNOSIS — Z941 Heart transplant status: Secondary | ICD-10-CM | POA: Diagnosis not present

## 2017-05-13 DIAGNOSIS — E118 Type 2 diabetes mellitus with unspecified complications: Secondary | ICD-10-CM | POA: Diagnosis not present

## 2017-05-26 DIAGNOSIS — I509 Heart failure, unspecified: Secondary | ICD-10-CM | POA: Diagnosis not present

## 2017-05-26 DIAGNOSIS — M1A09X0 Idiopathic chronic gout, multiple sites, without tophus (tophi): Secondary | ICD-10-CM | POA: Diagnosis not present

## 2017-05-26 DIAGNOSIS — M25522 Pain in left elbow: Secondary | ICD-10-CM | POA: Diagnosis not present

## 2017-05-26 DIAGNOSIS — M150 Primary generalized (osteo)arthritis: Secondary | ICD-10-CM | POA: Diagnosis not present

## 2017-05-26 DIAGNOSIS — N183 Chronic kidney disease, stage 3 (moderate): Secondary | ICD-10-CM | POA: Diagnosis not present

## 2017-05-26 DIAGNOSIS — Z6836 Body mass index (BMI) 36.0-36.9, adult: Secondary | ICD-10-CM | POA: Diagnosis not present

## 2017-05-26 DIAGNOSIS — M25531 Pain in right wrist: Secondary | ICD-10-CM | POA: Diagnosis not present

## 2017-05-26 DIAGNOSIS — E669 Obesity, unspecified: Secondary | ICD-10-CM | POA: Diagnosis not present

## 2017-06-09 DIAGNOSIS — K529 Noninfective gastroenteritis and colitis, unspecified: Secondary | ICD-10-CM | POA: Diagnosis not present

## 2017-06-09 DIAGNOSIS — I10 Essential (primary) hypertension: Secondary | ICD-10-CM | POA: Diagnosis not present

## 2017-06-11 DIAGNOSIS — R197 Diarrhea, unspecified: Secondary | ICD-10-CM | POA: Diagnosis not present

## 2017-06-12 DIAGNOSIS — I50810 Right heart failure, unspecified: Secondary | ICD-10-CM | POA: Diagnosis present

## 2017-06-12 DIAGNOSIS — Z794 Long term (current) use of insulin: Secondary | ICD-10-CM | POA: Diagnosis not present

## 2017-06-12 DIAGNOSIS — Z87891 Personal history of nicotine dependence: Secondary | ICD-10-CM | POA: Diagnosis not present

## 2017-06-12 DIAGNOSIS — N179 Acute kidney failure, unspecified: Secondary | ICD-10-CM | POA: Diagnosis not present

## 2017-06-12 DIAGNOSIS — N184 Chronic kidney disease, stage 4 (severe): Secondary | ICD-10-CM | POA: Diagnosis not present

## 2017-06-12 DIAGNOSIS — D899 Disorder involving the immune mechanism, unspecified: Secondary | ICD-10-CM | POA: Diagnosis not present

## 2017-06-12 DIAGNOSIS — K219 Gastro-esophageal reflux disease without esophagitis: Secondary | ICD-10-CM | POA: Diagnosis present

## 2017-06-12 DIAGNOSIS — G4733 Obstructive sleep apnea (adult) (pediatric): Secondary | ICD-10-CM | POA: Diagnosis present

## 2017-06-12 DIAGNOSIS — I2721 Secondary pulmonary arterial hypertension: Secondary | ICD-10-CM | POA: Diagnosis present

## 2017-06-12 DIAGNOSIS — I130 Hypertensive heart and chronic kidney disease with heart failure and stage 1 through stage 4 chronic kidney disease, or unspecified chronic kidney disease: Secondary | ICD-10-CM | POA: Diagnosis not present

## 2017-06-12 DIAGNOSIS — E039 Hypothyroidism, unspecified: Secondary | ICD-10-CM | POA: Diagnosis present

## 2017-06-12 DIAGNOSIS — F4321 Adjustment disorder with depressed mood: Secondary | ICD-10-CM | POA: Diagnosis present

## 2017-06-12 DIAGNOSIS — Z79899 Other long term (current) drug therapy: Secondary | ICD-10-CM | POA: Diagnosis not present

## 2017-06-12 DIAGNOSIS — K529 Noninfective gastroenteritis and colitis, unspecified: Secondary | ICD-10-CM | POA: Diagnosis not present

## 2017-06-12 DIAGNOSIS — R197 Diarrhea, unspecified: Secondary | ICD-10-CM | POA: Diagnosis not present

## 2017-06-12 DIAGNOSIS — Z7982 Long term (current) use of aspirin: Secondary | ICD-10-CM | POA: Diagnosis not present

## 2017-06-12 DIAGNOSIS — E785 Hyperlipidemia, unspecified: Secondary | ICD-10-CM | POA: Diagnosis present

## 2017-06-12 DIAGNOSIS — K861 Other chronic pancreatitis: Secondary | ICD-10-CM | POA: Diagnosis not present

## 2017-06-12 DIAGNOSIS — E1122 Type 2 diabetes mellitus with diabetic chronic kidney disease: Secondary | ICD-10-CM | POA: Diagnosis present

## 2017-06-12 DIAGNOSIS — N189 Chronic kidney disease, unspecified: Secondary | ICD-10-CM | POA: Diagnosis not present

## 2017-06-12 DIAGNOSIS — I129 Hypertensive chronic kidney disease with stage 1 through stage 4 chronic kidney disease, or unspecified chronic kidney disease: Secondary | ICD-10-CM | POA: Diagnosis not present

## 2017-06-12 DIAGNOSIS — Z941 Heart transplant status: Secondary | ICD-10-CM | POA: Diagnosis not present

## 2017-06-12 DIAGNOSIS — N289 Disorder of kidney and ureter, unspecified: Secondary | ICD-10-CM | POA: Diagnosis not present

## 2017-06-12 DIAGNOSIS — D869 Sarcoidosis, unspecified: Secondary | ICD-10-CM | POA: Diagnosis present

## 2017-06-12 DIAGNOSIS — E872 Acidosis: Secondary | ICD-10-CM | POA: Diagnosis not present

## 2017-06-12 DIAGNOSIS — K5730 Diverticulosis of large intestine without perforation or abscess without bleeding: Secondary | ICD-10-CM | POA: Diagnosis not present

## 2017-06-12 DIAGNOSIS — M109 Gout, unspecified: Secondary | ICD-10-CM | POA: Diagnosis present

## 2017-06-25 DIAGNOSIS — Z794 Long term (current) use of insulin: Secondary | ICD-10-CM | POA: Diagnosis not present

## 2017-06-25 DIAGNOSIS — Z79899 Other long term (current) drug therapy: Secondary | ICD-10-CM | POA: Diagnosis not present

## 2017-06-25 DIAGNOSIS — E1165 Type 2 diabetes mellitus with hyperglycemia: Secondary | ICD-10-CM | POA: Diagnosis not present

## 2017-06-25 DIAGNOSIS — N179 Acute kidney failure, unspecified: Secondary | ICD-10-CM | POA: Diagnosis not present

## 2017-06-25 DIAGNOSIS — E118 Type 2 diabetes mellitus with unspecified complications: Secondary | ICD-10-CM | POA: Diagnosis not present

## 2017-06-25 DIAGNOSIS — I10 Essential (primary) hypertension: Secondary | ICD-10-CM | POA: Diagnosis not present

## 2017-06-25 DIAGNOSIS — N189 Chronic kidney disease, unspecified: Secondary | ICD-10-CM | POA: Diagnosis not present

## 2017-07-01 DIAGNOSIS — N184 Chronic kidney disease, stage 4 (severe): Secondary | ICD-10-CM | POA: Diagnosis not present

## 2017-07-29 DIAGNOSIS — I10 Essential (primary) hypertension: Secondary | ICD-10-CM | POA: Diagnosis not present

## 2017-07-29 DIAGNOSIS — Z941 Heart transplant status: Secondary | ICD-10-CM | POA: Diagnosis not present

## 2017-07-29 DIAGNOSIS — Z79899 Other long term (current) drug therapy: Secondary | ICD-10-CM | POA: Diagnosis not present

## 2017-07-29 DIAGNOSIS — I2729 Other secondary pulmonary hypertension: Secondary | ICD-10-CM | POA: Diagnosis not present

## 2017-07-29 DIAGNOSIS — D899 Disorder involving the immune mechanism, unspecified: Secondary | ICD-10-CM | POA: Diagnosis not present

## 2017-07-29 DIAGNOSIS — N184 Chronic kidney disease, stage 4 (severe): Secondary | ICD-10-CM | POA: Diagnosis not present

## 2017-07-29 DIAGNOSIS — Z48298 Encounter for aftercare following other organ transplant: Secondary | ICD-10-CM | POA: Diagnosis not present

## 2017-07-29 DIAGNOSIS — N289 Disorder of kidney and ureter, unspecified: Secondary | ICD-10-CM | POA: Diagnosis not present

## 2017-07-29 DIAGNOSIS — I130 Hypertensive heart and chronic kidney disease with heart failure and stage 1 through stage 4 chronic kidney disease, or unspecified chronic kidney disease: Secondary | ICD-10-CM | POA: Diagnosis not present

## 2017-07-29 DIAGNOSIS — E1122 Type 2 diabetes mellitus with diabetic chronic kidney disease: Secondary | ICD-10-CM | POA: Diagnosis not present

## 2017-07-29 DIAGNOSIS — Z4821 Encounter for aftercare following heart transplant: Secondary | ICD-10-CM | POA: Diagnosis not present

## 2017-08-26 DIAGNOSIS — M25522 Pain in left elbow: Secondary | ICD-10-CM | POA: Diagnosis not present

## 2017-08-26 DIAGNOSIS — M1A09X0 Idiopathic chronic gout, multiple sites, without tophus (tophi): Secondary | ICD-10-CM | POA: Diagnosis not present

## 2017-08-26 DIAGNOSIS — Z6837 Body mass index (BMI) 37.0-37.9, adult: Secondary | ICD-10-CM | POA: Diagnosis not present

## 2017-08-26 DIAGNOSIS — M150 Primary generalized (osteo)arthritis: Secondary | ICD-10-CM | POA: Diagnosis not present

## 2017-08-26 DIAGNOSIS — I509 Heart failure, unspecified: Secondary | ICD-10-CM | POA: Diagnosis not present

## 2017-08-26 DIAGNOSIS — E669 Obesity, unspecified: Secondary | ICD-10-CM | POA: Diagnosis not present

## 2017-08-26 DIAGNOSIS — N183 Chronic kidney disease, stage 3 (moderate): Secondary | ICD-10-CM | POA: Diagnosis not present

## 2017-09-01 DIAGNOSIS — M549 Dorsalgia, unspecified: Secondary | ICD-10-CM | POA: Diagnosis not present

## 2017-09-01 DIAGNOSIS — R1031 Right lower quadrant pain: Secondary | ICD-10-CM | POA: Diagnosis not present

## 2017-09-01 DIAGNOSIS — R918 Other nonspecific abnormal finding of lung field: Secondary | ICD-10-CM | POA: Diagnosis not present

## 2017-09-01 DIAGNOSIS — I10 Essential (primary) hypertension: Secondary | ICD-10-CM | POA: Diagnosis not present

## 2017-09-01 DIAGNOSIS — N200 Calculus of kidney: Secondary | ICD-10-CM | POA: Diagnosis not present

## 2017-09-01 DIAGNOSIS — R109 Unspecified abdominal pain: Secondary | ICD-10-CM | POA: Diagnosis not present

## 2017-09-01 DIAGNOSIS — K5900 Constipation, unspecified: Secondary | ICD-10-CM | POA: Diagnosis not present

## 2017-09-01 DIAGNOSIS — R1011 Right upper quadrant pain: Secondary | ICD-10-CM | POA: Diagnosis not present

## 2017-09-15 ENCOUNTER — Telehealth: Payer: Self-pay | Admitting: Pulmonary Disease

## 2017-09-17 NOTE — Telephone Encounter (Signed)
Entered in error

## 2017-09-23 ENCOUNTER — Ambulatory Visit (INDEPENDENT_AMBULATORY_CARE_PROVIDER_SITE_OTHER): Payer: Medicare Other | Admitting: Adult Health

## 2017-09-23 ENCOUNTER — Encounter: Payer: Self-pay | Admitting: Adult Health

## 2017-09-23 DIAGNOSIS — Z23 Encounter for immunization: Secondary | ICD-10-CM | POA: Diagnosis not present

## 2017-09-23 DIAGNOSIS — G4733 Obstructive sleep apnea (adult) (pediatric): Secondary | ICD-10-CM | POA: Diagnosis not present

## 2017-09-23 NOTE — Assessment & Plan Note (Signed)
Wt loss  

## 2017-09-23 NOTE — Assessment & Plan Note (Signed)
Well controlled on CPAP At bedtime    Plan  Patient Instructions  Continue on CPAP at bedtime Keep up the good work Weight loss CPAP download  Follow up with Dr. Halford Chessman in 1 year  And As needed   Flu shot

## 2017-09-23 NOTE — Progress Notes (Signed)
@Patient  ID: David Gregory, male    DOB: 01/20/1966, 51 y.o.   MRN: [id number]  Chief Complaint  Patient presents with  . Follow-up    OSA    Referring provider: Vicenta Aly, FNP  HPI: 51 yo male followed for Sleep apnea.  Chronic systolic heart failure s/p cardiac transplant 2012 followed at [facility], [place], HTN, CKD stage 4 followed at [facility], DM, Hypothyroidism, Gout   Sleep tests PSG 04/27/12 >> AHI 139.5, SpO2 low 61%. CPAP 17 cm H2O. PLMI 0  ONO with CPAP and RA 07/07/12>>Test time 4 hr 59 min. Mean SpO2 91%, low SpO2 83%. Spent 44 min with SpO2 < 89%  08/10/12 Change to CPAP 15 cm H2O CPAP 10/26/15 to 11/24/15 >> used on 29 of 30 nights with average 8 hrs and 13 min.  Average AHI is 1.8 with CPAP 15 cm H2O.  Pulmonary tests Spirometry 12/22/13 >> FEV1 1.39 (51%), FEV1% 83 Spirometry 03/02/15 >> FEV1 1.67 (59%), FEV1% 83  Cardiac tests Echo 04/03/16 >> mild LVH, EF 55%   09/23/2017 Follow up : OSA  Pt returns for follow up for sleep apnea. He says he is doing well on CPAP . Wears each night without issues. Gets in about 7-8hrs. Denies significant daytime sleepiness.  Download requested. Discussed healthy diet and exercise with wt loss.  Wants flu shot today .      Allergies  Allergen Reactions  . Clindamycin Hcl     Wheezing and erythema/itching at IV site 01/27/12  . Prednisone Rash    Immunization History  Administered Date(s) Administered  . Influenza Split 08/28/2013, 09/14/2014, 10/03/2015  . Influenza Whole 11/10/2012  . Influenza, Seasonal, Injecte, Preservative Fre 09/11/2011, 11/11/2012, 08/24/2013, 08/24/2014, 09/14/2014, 09/27/2015, 08/14/2016  . Influenza,inj,Quad PF,6+ Mos 09/27/2015, 08/14/2016  . Influenza-Unspecified 09/11/2011, 11/11/2012, 08/24/2013, 08/24/2014  . Pneumococcal Polysaccharide-23 10/24/2012    Past Medical History:  Diagnosis Date  . Arthritis    "Every where"  . Blood transfusion    with heart transplant  . Carpal  tunnel syndrome    Right  . CHEST PAIN UNSPECIFIED   . CONGESTIVE HEART FAILURE UNSPECIFIED   . Diabetes mellitus   . Diarrhea   . Edema   . ERECTILE DYSFUNCTION, ORGANIC   . Gout, unspecified   . Hypertension   . Hypothyroidism   . Renal insufficiency    pt reports levels have been at 4.8  . SYSTOLIC HEART FAILURE, ACUTE ON CHRONIC   . SYSTOLIC HEART FAILURE, CHRONIC   . VENTRICULAR TACHYCARDIA     Tobacco History: History  Smoking Status  . Former Smoker  . Packs/day: 0.10  . Years: 30.00  . Quit date: 08/24/2009  Smokeless Tobacco  . Never Used   Counseling given: Not Answered   Outpatient Encounter Prescriptions as of 09/23/2017  Medication Sig  . aspirin EC 81 MG tablet Take 81 mg by mouth daily.  . calcium-vitamin D (OSCAL WITH D) 500-200 MG-UNIT per tablet Take 1 tablet by mouth 2 (two) times daily.  . carvedilol (COREG) 25 MG tablet Take 25 mg by mouth 2 (two) times daily with a meal.   . colchicine 0.6 MG tablet Take 0.6 mg by mouth daily as needed. For gout  . febuxostat (ULORIC) 40 MG tablet Take 80 mg by mouth daily.  Marland Kitchen HYDROcodone-acetaminophen (NORCO) 5-325 MG per tablet Take 1 tablet by mouth every 6 (six) hours as needed for pain.  Marland Kitchen insulin NPH-insulin regular (NOVOLIN 70/30) (70-30) 100 UNIT/ML injection Inject 26-78  Units into the skin See admin instructions. Inject 78 units in the morning and 26 units at night  . levothyroxine (SYNTHROID, LEVOTHROID) 25 MCG tablet Take 25 mcg by mouth daily.    . Multiple Vitamin (MULTIVITAMIN) tablet Take 1 tablet by mouth daily.    . mycophenolate (CELLCEPT) 500 MG tablet Take 1,500 mg by mouth 2 (two) times daily.   . pantoprazole (PROTONIX) 40 MG tablet Take 40 mg by mouth daily.  . pravastatin (PRAVACHOL) 20 MG tablet Take 20 mg by mouth daily.    . predniSONE (DELTASONE) 10 MG tablet Take 5 mg by mouth daily.   . tacrolimus (PROGRAF) 1 MG capsule Take 6 mg by mouth 2 (two) times daily.   Marland Kitchen torsemide (DEMADEX) 20  MG tablet Take 20 mg by mouth daily.   . VOLTAREN 1 % GEL 3 (three) times daily.   No facility-administered encounter medications on file as of 09/23/2017.      Review of Systems  Constitutional:   No  weight loss, night sweats,  Fevers, chills, fatigue, or  lassitude.  HEENT:   No headaches,  Difficulty swallowing,  Tooth/dental problems, or  Sore throat,                No sneezing, itching, ear ache, nasal congestion, post nasal drip,   CV:  No chest pain,  Orthopnea, PND, swelling in lower extremities, anasarca, dizziness, palpitations, syncope.   GI  No heartburn, indigestion, abdominal pain, nausea, vomiting, diarrhea, change in bowel habits, loss of appetite, bloody stools.   Resp: No shortness of breath with exertion or at rest.  No excess mucus, no productive cough,  No non-productive cough,  No coughing up of blood.  No change in color of mucus.  No wheezing.  No chest wall deformity  Skin: no rash or lesions.  GU: no dysuria, change in color of urine, no urgency or frequency.  No flank pain, no hematuria   MS:  No joint pain or swelling.  No decreased range of motion.  No back pain.    Physical Exam  BP 134/74 (BP Location: Right Arm, Cuff Size: Normal)   Pulse 82   Ht 5\' 4"  (1.626 m)   Wt 214 lb (97.1 kg)   SpO2 94%   BMI 36.73 kg/m   GEN: A/Ox3; pleasant , NAD, obese    HEENT:  [place]/AT,  EACs-clear, TMs-wnl, NOSE-clear, THROAT-clear, no lesions, no postnasal drip or exudate noted. Poor dentition . Class 2-3 MP airway   NECK:  Supple w/ fair ROM; no JVD; normal carotid impulses w/o bruits; no thyromegaly or nodules palpated; no lymphadenopathy.    RESP  Clear  P & A; w/o, wheezes/ rales/ or rhonchi. no accessory muscle use, no dullness to percussion  CARD:  RRR, no m/r/g, no peripheral edema, pulses intact, no cyanosis or clubbing.  GI:   Soft & nt; nml bowel sounds; no organomegaly or masses detected.   Musco: Warm bil, no deformities or joint swelling  noted.   Neuro: alert, no focal deficits noted.    Skin: Warm, no lesions or rashes    Lab Results:   BMET   BNP No results found for: BNP   Imaging: No results found.   Assessment & Plan:   OSA (obstructive sleep apnea) Well controlled on CPAP At bedtime    Plan  Patient Instructions  Continue on CPAP at bedtime Keep up the good work Weight loss CPAP download  Follow up with Dr. Halford Chessman  in 1 year  And As needed   Flu shot     Morbid obesity ([place]) Wt loss      Rexene Edison, NP 09/23/2017

## 2017-09-23 NOTE — Progress Notes (Signed)
I have reviewed and agree with assessment/plan.  Chesley Mires, MD [facility] Pulmonary/Critical Care 09/23/2017, 1:22 PM Pager:  [phone]

## 2017-09-23 NOTE — Patient Instructions (Addendum)
Continue on CPAP at bedtime Keep up the good work Weight loss CPAP download  Follow up with Dr. Halford Chessman in 1 year  And As needed   Flu shot

## 2017-09-24 NOTE — Addendum Note (Signed)
Addended by: Parke Poisson E on: 09/24/2017 05:29 PM   Modules accepted: Orders

## 2017-10-08 DIAGNOSIS — I10 Essential (primary) hypertension: Secondary | ICD-10-CM | POA: Diagnosis not present

## 2017-10-08 DIAGNOSIS — N184 Chronic kidney disease, stage 4 (severe): Secondary | ICD-10-CM | POA: Diagnosis not present

## 2017-10-08 DIAGNOSIS — N185 Chronic kidney disease, stage 5: Secondary | ICD-10-CM | POA: Diagnosis not present

## 2017-10-08 DIAGNOSIS — D631 Anemia in chronic kidney disease: Secondary | ICD-10-CM | POA: Diagnosis not present

## 2017-10-23 ENCOUNTER — Telehealth: Payer: Self-pay | Admitting: Adult Health

## 2017-10-23 NOTE — Telephone Encounter (Signed)
Pt seen on 10.31.18 for OSA follow up  Download requested from DME  Download received and reviewed by TP: looks good, no changes  Called spoke with patient, advised of download results as stated by TP Patient voiced his understanding Nothing further needed; will sign off Download sent for scan

## 2017-11-20 ENCOUNTER — Telehealth: Payer: Self-pay | Admitting: Adult Health

## 2017-11-20 DIAGNOSIS — G4733 Obstructive sleep apnea (adult) (pediatric): Secondary | ICD-10-CM

## 2017-11-20 NOTE — Telephone Encounter (Signed)
Order placed for new cpap machine.  Nothing further needed.

## 2017-11-20 NOTE — Telephone Encounter (Signed)
Please send order for CPAP 15 cm H2O with heated humidity.

## 2017-11-20 NOTE — Telephone Encounter (Signed)
Pt calling requesting a new cpap machine.   Called [facility], states that it appears that pt received cpap in 2013 so he should be eligible for a new machine.  VS ok to order?  Thanks!

## 2017-11-27 DIAGNOSIS — M1A09X0 Idiopathic chronic gout, multiple sites, without tophus (tophi): Secondary | ICD-10-CM | POA: Diagnosis not present

## 2017-11-27 DIAGNOSIS — E669 Obesity, unspecified: Secondary | ICD-10-CM | POA: Diagnosis not present

## 2017-11-27 DIAGNOSIS — I509 Heart failure, unspecified: Secondary | ICD-10-CM | POA: Diagnosis not present

## 2017-11-27 DIAGNOSIS — N183 Chronic kidney disease, stage 3 (moderate): Secondary | ICD-10-CM | POA: Diagnosis not present

## 2017-11-27 DIAGNOSIS — M25522 Pain in left elbow: Secondary | ICD-10-CM | POA: Diagnosis not present

## 2017-11-27 DIAGNOSIS — M150 Primary generalized (osteo)arthritis: Secondary | ICD-10-CM | POA: Diagnosis not present

## 2017-11-27 DIAGNOSIS — Z6837 Body mass index (BMI) 37.0-37.9, adult: Secondary | ICD-10-CM | POA: Diagnosis not present

## 2018-01-14 DIAGNOSIS — N289 Disorder of kidney and ureter, unspecified: Secondary | ICD-10-CM | POA: Diagnosis not present

## 2018-01-14 DIAGNOSIS — E872 Acidosis: Secondary | ICD-10-CM | POA: Diagnosis not present

## 2018-01-14 DIAGNOSIS — D631 Anemia in chronic kidney disease: Secondary | ICD-10-CM | POA: Diagnosis not present

## 2018-01-14 DIAGNOSIS — N185 Chronic kidney disease, stage 5: Secondary | ICD-10-CM | POA: Diagnosis not present

## 2018-01-14 DIAGNOSIS — N184 Chronic kidney disease, stage 4 (severe): Secondary | ICD-10-CM | POA: Diagnosis not present

## 2018-02-25 DIAGNOSIS — M1A09X0 Idiopathic chronic gout, multiple sites, without tophus (tophi): Secondary | ICD-10-CM | POA: Diagnosis not present

## 2018-02-25 DIAGNOSIS — Z6839 Body mass index (BMI) 39.0-39.9, adult: Secondary | ICD-10-CM | POA: Diagnosis not present

## 2018-02-25 DIAGNOSIS — I509 Heart failure, unspecified: Secondary | ICD-10-CM | POA: Diagnosis not present

## 2018-02-25 DIAGNOSIS — M150 Primary generalized (osteo)arthritis: Secondary | ICD-10-CM | POA: Diagnosis not present

## 2018-02-25 DIAGNOSIS — N183 Chronic kidney disease, stage 3 (moderate): Secondary | ICD-10-CM | POA: Diagnosis not present

## 2018-02-25 DIAGNOSIS — E669 Obesity, unspecified: Secondary | ICD-10-CM | POA: Diagnosis not present

## 2018-02-25 DIAGNOSIS — M25522 Pain in left elbow: Secondary | ICD-10-CM | POA: Diagnosis not present

## 2018-04-01 DIAGNOSIS — I10 Essential (primary) hypertension: Secondary | ICD-10-CM | POA: Diagnosis not present

## 2018-04-01 DIAGNOSIS — N184 Chronic kidney disease, stage 4 (severe): Secondary | ICD-10-CM | POA: Diagnosis not present

## 2018-04-01 DIAGNOSIS — D631 Anemia in chronic kidney disease: Secondary | ICD-10-CM | POA: Diagnosis not present

## 2018-04-01 DIAGNOSIS — E872 Acidosis: Secondary | ICD-10-CM | POA: Diagnosis not present

## 2018-04-01 DIAGNOSIS — D509 Iron deficiency anemia, unspecified: Secondary | ICD-10-CM | POA: Diagnosis not present

## 2018-04-21 DIAGNOSIS — D509 Iron deficiency anemia, unspecified: Secondary | ICD-10-CM | POA: Diagnosis not present

## 2018-04-29 DIAGNOSIS — D509 Iron deficiency anemia, unspecified: Secondary | ICD-10-CM | POA: Diagnosis not present

## 2018-06-15 DIAGNOSIS — M25522 Pain in left elbow: Secondary | ICD-10-CM | POA: Diagnosis not present

## 2018-06-15 DIAGNOSIS — I509 Heart failure, unspecified: Secondary | ICD-10-CM | POA: Diagnosis not present

## 2018-06-15 DIAGNOSIS — E669 Obesity, unspecified: Secondary | ICD-10-CM | POA: Diagnosis not present

## 2018-06-15 DIAGNOSIS — N183 Chronic kidney disease, stage 3 (moderate): Secondary | ICD-10-CM | POA: Diagnosis not present

## 2018-06-15 DIAGNOSIS — M1A09X0 Idiopathic chronic gout, multiple sites, without tophus (tophi): Secondary | ICD-10-CM | POA: Diagnosis not present

## 2018-06-15 DIAGNOSIS — Z6837 Body mass index (BMI) 37.0-37.9, adult: Secondary | ICD-10-CM | POA: Diagnosis not present

## 2018-06-15 DIAGNOSIS — M150 Primary generalized (osteo)arthritis: Secondary | ICD-10-CM | POA: Diagnosis not present

## 2018-06-15 DIAGNOSIS — M25531 Pain in right wrist: Secondary | ICD-10-CM | POA: Diagnosis not present

## 2018-07-01 DIAGNOSIS — N184 Chronic kidney disease, stage 4 (severe): Secondary | ICD-10-CM | POA: Diagnosis not present

## 2018-07-01 DIAGNOSIS — I10 Essential (primary) hypertension: Secondary | ICD-10-CM | POA: Diagnosis not present

## 2018-07-01 DIAGNOSIS — E872 Acidosis: Secondary | ICD-10-CM | POA: Diagnosis not present

## 2018-07-01 DIAGNOSIS — D509 Iron deficiency anemia, unspecified: Secondary | ICD-10-CM | POA: Diagnosis not present

## 2018-07-05 DIAGNOSIS — M25522 Pain in left elbow: Secondary | ICD-10-CM | POA: Diagnosis not present

## 2018-07-05 DIAGNOSIS — Z6838 Body mass index (BMI) 38.0-38.9, adult: Secondary | ICD-10-CM | POA: Diagnosis not present

## 2018-07-05 DIAGNOSIS — E669 Obesity, unspecified: Secondary | ICD-10-CM | POA: Diagnosis not present

## 2018-07-05 DIAGNOSIS — N183 Chronic kidney disease, stage 3 (moderate): Secondary | ICD-10-CM | POA: Diagnosis not present

## 2018-07-05 DIAGNOSIS — M25531 Pain in right wrist: Secondary | ICD-10-CM | POA: Diagnosis not present

## 2018-07-05 DIAGNOSIS — M150 Primary generalized (osteo)arthritis: Secondary | ICD-10-CM | POA: Diagnosis not present

## 2018-07-05 DIAGNOSIS — M1A09X0 Idiopathic chronic gout, multiple sites, without tophus (tophi): Secondary | ICD-10-CM | POA: Diagnosis not present

## 2018-07-05 DIAGNOSIS — I509 Heart failure, unspecified: Secondary | ICD-10-CM | POA: Diagnosis not present

## 2018-07-08 DIAGNOSIS — M1A0221 Idiopathic chronic gout, left elbow, with tophus (tophi): Secondary | ICD-10-CM | POA: Diagnosis not present

## 2018-07-30 ENCOUNTER — Other Ambulatory Visit: Payer: Self-pay | Admitting: Orthopedic Surgery

## 2018-08-02 ENCOUNTER — Other Ambulatory Visit: Payer: Self-pay | Admitting: Orthopedic Surgery

## 2018-08-06 NOTE — Pre-Procedure Instructions (Signed)
David Gregory  08/06/2018      [facility] ([facility]), [place] [address] [place]) [place] [address] Phone: [phone] Fax: [phone]    Your procedure is scheduled on August 13, 2018.  Report to [facility] Admitting at 530 AM.  Call this number if you have problems the morning of surgery:  [phone]   Remember:  Do not eat or drink after midnight.    Take these medicines the morning of surgery with A SIP OF WATER  Carvedilol (coreg) colchichine -if needed for gout febuxostat (Uloric) Hydrocodone-acetaminophen (norco)-if needed for pain Levothyroxine (synthroid) Cellcept Pantoprazole (protonix) Prednisone (deltasone) Tacrolimus (prograf)  Follow your surgeon's instructions on when to hold/resume aspirin.  If no instructions were given call the office to determine how they would like to you take aspirin  7 days prior to surgery STOP taking any Voltaren gel,  Aleve, Naproxen, Ibuprofen, Motrin, Advil, Goody's, BC's, all herbal medications, fish oil, and all vitamins  WHAT DO I DO ABOUT MY DIABETES MEDICATION?  Marland Kitchen Do not take oral diabetes medicines (pills) the morning of surgery.  THE NIGHT BEFORE SURGERY, take 18 units of NPH (Novolin 70/30), 70% of your normal dose.  . THE MORNING OF SURGERY, take no insulin.  . The day of surgery, do not take other diabetes injectables, including Byetta (exenatide), Bydureon (exenatide ER), Victoza (liraglutide), or Trulicity (dulaglutide).  Reviewed and Endorsed by [facility] Patient Education Committee, August 2015   How to Manage Your Diabetes Before and After Surgery  Why is it important to control my blood sugar before and after surgery? . Improving blood sugar levels before and after surgery helps healing and can limit problems. . A way of improving blood sugar control is eating a healthy diet by: o  Eating less sugar and carbohydrates o  Increasing  activity/exercise o  Talking with your doctor about reaching your blood sugar goals . High blood sugars (greater than 180 mg/dL) can raise your risk of infections and slow your recovery, so you will need to focus on controlling your diabetes during the weeks before surgery. . Make sure that the doctor who takes care of your diabetes knows about your planned surgery including the date and location.  How do I manage my blood sugar before surgery? . Check your blood sugar at least 4 times a day, starting 2 days before surgery, to make sure that the level is not too high or low. o Check your blood sugar the morning of your surgery when you wake up and every 2 hours until you get to the Short Stay unit. . If your blood sugar is less than 70 mg/dL, you will need to treat for low blood sugar: o Do not take insulin. o Treat a low blood sugar (less than 70 mg/dL) with  cup of clear juice (cranberry or apple), 4 glucose tablets, OR glucose gel. Recheck blood sugar in 15 minutes after treatment (to make sure it is greater than 70 mg/dL). If your blood sugar is not greater than 70 mg/dL on recheck, call [phone] o  for further instructions. . Report your blood sugar to the short stay nurse when you get to Short Stay.  . If you are admitted to the hospital after surgery: o Your blood sugar will be checked by the staff and you will probably be given insulin after surgery (instead of oral diabetes medicines) to make sure you have good  blood sugar levels. o The goal for blood sugar control after surgery is 80-180 mg/dL.   Contacts, dentures or bridgework may not be worn into surgery.  Leave your suitcase in the car.  After surgery it may be brought to your room.  For patients admitted to the hospital, discharge time will be determined by your treatment team.  Patients discharged the day of surgery will not be allowed to drive home.    [facility]- Preparing For Surgery  Before surgery, you can play  an important role. Because skin is not sterile, your skin needs to be as free of germs as possible. You can reduce the number of germs on your skin by washing with CHG (chlorahexidine gluconate) Soap before surgery.  CHG is an antiseptic cleaner which kills germs and bonds with the skin to continue killing germs even after washing.    Oral Hygiene is also important to reduce your risk of infection.  Remember - BRUSH YOUR TEETH THE MORNING OF SURGERY WITH YOUR REGULAR TOOTHPASTE  Please do not use if you have an allergy to CHG or antibacterial soaps. If your skin becomes reddened/irritated stop using the CHG.  Do not shave (including legs and underarms) for at least 48 hours prior to first CHG shower. It is OK to shave your face.  Please follow these instructions carefully.   1. Shower the NIGHT BEFORE SURGERY and the MORNING OF SURGERY with CHG.   2. If you chose to wash your hair, wash your hair first as usual with your normal shampoo.  3. After you shampoo, rinse your hair and body thoroughly to remove the shampoo.  4. Use CHG as you would any other liquid soap. You can apply CHG directly to the skin and wash gently with a scrungie or a clean washcloth.   5. Apply the CHG Soap to your body ONLY FROM THE NECK DOWN.  Do not use on open wounds or open sores. Avoid contact with your eyes, ears, mouth and genitals (private parts). Wash Face and genitals (private parts)  with your normal soap.  6. Wash thoroughly, paying special attention to the area where your surgery will be performed.  7. Thoroughly rinse your body with warm water from the neck down.  8. DO NOT shower/wash with your normal soap after using and rinsing off the CHG Soap.  9. Pat yourself dry with a CLEAN TOWEL.  10. Wear CLEAN PAJAMAS to bed the night before surgery, wear comfortable clothes the morning of surgery  11. Place CLEAN SHEETS on your bed the night of your first shower and DO NOT SLEEP WITH PETS.  Day of  Surgery:  Do not apply any deodorants/lotions.  Please wear clean clothes to the hospital/surgery center.   Remember to brush your teeth WITH YOUR REGULAR TOOTHPASTE.   Do not wear jewelry  Do not wear lotions, powders, or colognes, or deodorant.  Men may shave face and neck.  Do not bring valuables to the hospital.   [facility] is not responsible for any belongings or valuables.  Please read over the following fact sheets that you were given.

## 2018-08-06 NOTE — Progress Notes (Addendum)
PCP: Vicenta Aly, FNP  Cardiologist: Para Skeans, MD-transplant Coordinator  EKG: pt reports he has had one in the past year, requested records from [place] test: 2015 result under Care Everywhere  ECHO: 07/2017 result under Care Everywhere  Cardiac Cath: 2012 in [facility]  Chest x-ray:  Pt reports he has had one in the [place]  [place]plant in 2012-On aspirin 81 mg, no instructions given, advised to call Dr. Bertis Ruddy office for instructions

## 2018-08-09 ENCOUNTER — Encounter (HOSPITAL_COMMUNITY): Payer: Self-pay

## 2018-08-09 ENCOUNTER — Other Ambulatory Visit: Payer: Self-pay

## 2018-08-09 ENCOUNTER — Encounter (HOSPITAL_COMMUNITY)
Admission: RE | Admit: 2018-08-09 | Discharge: 2018-08-09 | Disposition: A | Discharge status: Home / Self Care | Payer: Medicare Other | Source: Ambulatory Visit | Attending: Orthopedic Surgery | Admitting: Orthopedic Surgery

## 2018-08-09 DIAGNOSIS — R2232 Localized swelling, mass and lump, left upper limb: Secondary | ICD-10-CM | POA: Diagnosis not present

## 2018-08-09 DIAGNOSIS — Z01812 Encounter for preprocedural laboratory examination: Secondary | ICD-10-CM | POA: Diagnosis not present

## 2018-08-09 LAB — BASIC METABOLIC PANEL
Anion gap: 14 (ref 5–15)
BUN: 66 mg/dL — ABNORMAL HIGH (ref 6–20)
CO2: 18 mmol/L — ABNORMAL LOW (ref 22–32)
Calcium: 9.2 mg/dL (ref 8.9–10.3)
Chloride: 111 mmol/L (ref 98–111)
Creatinine, Ser: 3.99 mg/dL — ABNORMAL HIGH (ref 0.61–1.24)
GFR calc Af Amer: 18 mL/min — ABNORMAL LOW (ref >60)
GFR calc non Af Amer: 16 mL/min — ABNORMAL LOW (ref >60)
Glucose, Bld: 132 mg/dL — ABNORMAL HIGH (ref 70–99)
Potassium: 4.2 mmol/L (ref 3.5–5.1)
Sodium: 143 mmol/L (ref 135–145)

## 2018-08-09 LAB — GLUCOSE, CAPILLARY: Glucose-Capillary: 105 mg/dL — ABNORMAL HIGH (ref 70–99)

## 2018-08-09 LAB — CBC
HCT: 39.4 % (ref 39.0–52.0)
Hemoglobin: 12 g/dL — ABNORMAL LOW (ref 13.0–17.0)
MCH: 29.4 pg (ref 26.0–34.0)
MCHC: 30.5 g/dL (ref 30.0–36.0)
MCV: 96.6 fL (ref 78.0–100.0)
Platelets: 115 10*3/uL — ABNORMAL LOW (ref 150–400)
RBC: 4.08 MIL/uL — ABNORMAL LOW (ref 4.22–5.81)
RDW: 13.9 % (ref 11.5–15.5)
WBC: 5 10*3/uL (ref 4.0–10.5)

## 2018-08-10 LAB — HEMOGLOBIN A1C
Hgb A1c MFr Bld: 6.6 % — ABNORMAL HIGH (ref 4.8–5.6)
Mean Plasma Glucose: 143 mg/dL

## 2018-08-10 NOTE — Progress Notes (Addendum)
Anesthesia Chart Review:  Case:  [id number] Date/Time:  08/13/18 0715   Procedure:  EXCISION OF LEFT ELBOW MASS (Left )   Anesthesia type:  General   Pre-op diagnosis:  LEFT ELBOW MASS   Location:  [facility] OR   Surgeon:  Charlotte Crumb, MD      DISCUSSION: 52 yo male former smoker for above procedure. 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  Pt has preop clearance from cardiac transplant team dated 07/30/18 advising to hold ASA day of procedure.  Stable CKD with baseline creatinine ~3.6 per labs in care everywhere. Follows with Nephrology at [facility].  Anticipate he can proceed as planned barring acute status change.  VS: BP (!) 157/91   Pulse 95   Temp 37 C   Resp 20   Ht 5\' 6"  (1.676 m)   Wt 99.1 kg   SpO2 98%   BMI 35.25 kg/m   PROVIDERS: Vicenta Aly, FNP is PCP  Naida Sleight, MD is Nephrologist last seen 07/01/2018  Berneda Rose, MD is Transplant Cardiologist last seen 07/29/2017  LABS: Labs reviewed: Acceptable for surgery. Stable CKD IV (all labs ordered are listed, but only abnormal results are displayed)  Labs Reviewed  GLUCOSE, CAPILLARY - Abnormal; Notable for the following components:      Result Value   Glucose-Capillary 105 (*)    All other components within normal limits  BASIC METABOLIC PANEL - Abnormal; Notable for the following components:   CO2 18 (*)    Glucose, Bld 132 (*)    BUN 66 (*)    Creatinine, Ser 3.99 (*)    GFR calc non Af Amer 16 (*)    GFR calc Af Amer 18 (*)    All other components within normal limits  CBC - Abnormal; Notable for the following components:   RBC 4.08 (*)    Hemoglobin 12.0 (*)    Platelets 115 (*)    All other components within normal limits  HEMOGLOBIN A1C - Abnormal; Notable for the following components:   Hgb A1c MFr Bld 6.6  (*)    All other components within normal limits     IMAGES: Study: XR CHEST PA AND LATERAL 06/12/2017 (care everywhere):  Indication:Other ( add clinical information to comment box below), Z94.1 Heart transplant status (CMS-HCC).  Comparisons:Chest radiograph dated September 14, 2014.  Findings: Patient is status post median sternotomy, heart transplant, and tricuspid valve replacement. Stable cardiac mediastinal contours. There are no consolidative opacities. No pneumothorax or pleural effusion. No acute osseous or soft tissue abnormality.  Impression: No pulmonary parenchymal opacities.   EKG: 06/12/2017 (care everywhere, narrative only, tracing requested): Normal sinus rhythm. Right bundle branch block. When compared with ECG of 15-Mar-2015. No significant change was found   CV: TTE 07/29/2017 (care everywhere): ECHOCARDIOGRAPHIC DESCRIPTIONS ----------------------------------------------- AORTIC ROOT         Size: Normal   Dissection: INDETERM FOR DISSECTION  AORTIC VALVE     Leaflets: Tricuspid             Morphology: Normal     Mobility: Fully Mobile  LEFT VENTRICLE                                      Anterior: Normal  Size: Normal                                 Lateral: Normal  Contraction: Normal                                  Septal: Normal   Closest EF: >55%(Estimated)  Calc.EF: 57% (3D)      Apical: Normal    LV masses: No Masses                             Inferior: Normal          LVH: MODERATE LVH                         Posterior: Normal  LV GLS(LOL): -22.0% Dias.FxClass: N/A  MITRAL VALVE     Leaflets: Normal                  Mobility: Fully mobile   Morphology: Normal  LEFT ATRIUM         Size: MILDLY ENLARGED    LA masses: No masses               Normal IAS  MAIN PA         Size: DILATED  PULMONIC VALVE   Morphology: Normal     Mobility: Fully Mobile  RIGHT VENTRICLE         Size: MILDLY ENLARGED           Free wall:  HYPOCONTRACTILE  Contraction: MILD GLOBAL DECREASE      RV masses: No Masses        TAPSE:   1.0 cm,  Normal Range [>= 1.6 cm]  TRICUSPID VALVE     Leaflets: Normal                  Mobility: Fully mobile   Morphology: PROSTHETIC RING  RIGHT ATRIUM         Size: MILDLY ENLARGED            RA Other: None    RA masses: No masses  PERICARDIUM        Fluid: No effusion  INFERIOR VENACAVA         Size: Normal     Normal respiratory collapse  DOPPLER ECHO and OTHER SPECIAL PROCEDURES ------------------------------------    Aortic: No AR                  No AS     Mitral: TRIVIAL MR             No MS    MV Inflow E Vel.= 113.0 cm/s  MV Annulus E'Vel.= 8.0 cm/s  E/E'Ratio= 14  Tricuspid: No TR                  PROSTHETIC TV RING     1.0 m/s peak vel    9 mmHg peak grad   4 mmHg mean grad 5.0 cm2 by DOPPLER  Pulmonary: TRIVIAL PR             No PS      Other:  INTERPRETATION ---------------------------------------------------------------   NORMAL LEFT VENTRICULAR SYSTOLIC FUNCTION WITH MODERATE LVH   MILD RV SYSTOLIC DYSFUNCTION (See above)   VALVULAR REGURGITATION: TRIVIAL  MR, TRIVIAL PR   PROSTHETIC VALVE(S): PROSTHETIC TV RING    Compared with prior Echo study on 03/26/2017: NO SIGNIFICANT CHANGES  Past Medical History:  Diagnosis Date  . Arthritis    "Every where"  . Blood transfusion    with heart transplant  . Carpal tunnel syndrome    Right  . CHEST PAIN UNSPECIFIED   . CONGESTIVE HEART FAILURE UNSPECIFIED   . Diabetes mellitus   . Diarrhea   . Edema   . ERECTILE DYSFUNCTION, ORGANIC   . Gout, unspecified   . Hypertension   . Hypothyroidism   . Renal insufficiency    pt reports levels have been at 4.8  . SYSTOLIC HEART FAILURE, ACUTE ON CHRONIC   . SYSTOLIC HEART FAILURE, CHRONIC   . VENTRICULAR TACHYCARDIA     Past Surgical History:  Procedure Laterality Date  . CARPAL TUNNEL RELEASE  09/06/2012   Procedure: CARPAL TUNNEL RELEASE;  Surgeon: Wynonia Sours, MD;  Location: [facility];  Service: Orthopedics;  Laterality: Right;  FOREARM BLOCK  . EYE SURGERY     "styl removed"  . FINGER ARTHRODESIS  11/26/2012   Procedure: ARTHRODESIS FINGER;  Surgeon: Wynonia Sours, MD;  Location: [facility];  Service: Orthopedics;  Laterality: Right;  EXCISION TOPHUS FUSION DIP (Right Little Finger)   . FRACTURE SURGERY     bilateral arms and l ankle fixed  . HEART TRANSPLANT  feb/ 2012   Done at [facility] followed by Dr. Stann Mainland  . MASS EXCISION  01/27/2012   Procedure: EXCISION MASS;  Surgeon: Mcarthur Rossetti, MD;  Location: [facility];  Service: Orthopedics;  Laterality: Left;  Excision left middle finger mass    MEDICATIONS: . amoxicillin (AMOXIL) 500 MG capsule  . aspirin EC 81 MG tablet  . carvedilol (COREG) 25 MG tablet  . cloNIDine (CATAPRES) 0.2 MG tablet  . colchicine 0.6 MG tablet  . febuxostat (ULORIC) 40 MG tablet  . hydrALAZINE (APRESOLINE) 100 MG tablet  . HYDROcodone-acetaminophen (NORCO) 5-325 MG per tablet  . insulin NPH-insulin regular (NOVOLIN 70/30) (70-30) 100 UNIT/ML injection  . levothyroxine (SYNTHROID, LEVOTHROID) 25 MCG tablet  . Multiple Vitamin (MULTIVITAMIN) tablet  . mycophenolate (CELLCEPT) 500 MG tablet  . pantoprazole (PROTONIX) 40 MG tablet  . paricalcitol (ZEMPLAR) 1 MCG capsule  . pravastatin (PRAVACHOL) 20 MG tablet  . predniSONE (DELTASONE) 10 MG tablet  . sodium bicarbonate 650 MG tablet  . tacrolimus (PROGRAF) 1 MG capsule  . torsemide (DEMADEX) 20 MG tablet   No current facility-administered medications for this encounter.      Wynonia Musty [facility] Short Stay Center/Anesthesiology Phone [phone] 08/10/2018 11:52 AM

## 2018-08-12 NOTE — Anesthesia Preprocedure Evaluation (Addendum)
Anesthesia Evaluation  Patient identified by MRN, date of birth, ID band Patient awake    Reviewed: Allergy & Precautions, NPO status , Patient's Chart, lab work & pertinent test results  Airway Mallampati: III  TM Distance: >3 FB Neck ROM: Full    Dental no notable dental hx. (+) Teeth Intact, Loose   Pulmonary sleep apnea , former smoker,    Pulmonary exam normal breath sounds clear to auscultation       Cardiovascular hypertension, Pt. on medications and Pt. on home beta blockers + CAD and +CHF  Normal cardiovascular exam Rhythm:Regular Rate:Normal  Pt with heart transplant   Neuro/Psych    GI/Hepatic   Endo/Other  diabetes, Type 1Hypothyroidism   Renal/GU      Musculoskeletal  (+) Arthritis ,   Abdominal (+) + obese,   Peds  Hematology   Anesthesia Other Findings   Reproductive/Obstetrics                            Lab Results  Component Value Date   WBC 5.0 08/09/2018   HGB 12.0 (L) 08/09/2018   HCT 39.4 08/09/2018   MCV 96.6 08/09/2018   PLT 115 (L) 08/09/2018    Anesthesia Physical Anesthesia Plan  ASA: III  Anesthesia Plan: General   Post-op Pain Management:  Regional for Post-op pain   Induction:   PONV Risk Score and Plan: 2 and Treatment may vary due to age or medical condition, Ondansetron and Dexamethasone  Airway Management Planned: LMA  Additional Equipment:   Intra-op Plan:   Post-operative Plan: Extubation in OR  Informed Consent: I have reviewed the patients History and Physical, chart, labs and discussed the procedure including the risks, benefits and alternatives for the proposed anesthesia with the patient or authorized representative who has indicated his/her understanding and acceptance.   Dental advisory given  Plan Discussed with:   Anesthesia Plan Comments: (EKg)        Anesthesia Quick Evaluation

## 2018-08-13 ENCOUNTER — Ambulatory Visit (HOSPITAL_COMMUNITY): Payer: Medicare Other | Admitting: Physician Assistant

## 2018-08-13 ENCOUNTER — Encounter (HOSPITAL_COMMUNITY): Payer: Self-pay | Admitting: *Deleted

## 2018-08-13 ENCOUNTER — Ambulatory Visit (HOSPITAL_COMMUNITY): Payer: Medicare Other | Admitting: Anesthesiology

## 2018-08-13 ENCOUNTER — Ambulatory Visit (HOSPITAL_COMMUNITY)
Admission: RE | Admit: 2018-08-13 | Discharge: 2018-08-13 | Disposition: A | Discharge status: Home / Self Care | Payer: Medicare Other | Source: Ambulatory Visit | Attending: Orthopedic Surgery | Admitting: Orthopedic Surgery

## 2018-08-13 ENCOUNTER — Encounter (HOSPITAL_COMMUNITY)
Admission: RE | Disposition: A | Discharge status: Home / Self Care | Payer: Self-pay | Source: Ambulatory Visit | Attending: Orthopedic Surgery

## 2018-08-13 DIAGNOSIS — E119 Type 2 diabetes mellitus without complications: Secondary | ICD-10-CM | POA: Diagnosis not present

## 2018-08-13 DIAGNOSIS — Z87891 Personal history of nicotine dependence: Secondary | ICD-10-CM | POA: Diagnosis not present

## 2018-08-13 DIAGNOSIS — Z7982 Long term (current) use of aspirin: Secondary | ICD-10-CM | POA: Diagnosis not present

## 2018-08-13 DIAGNOSIS — Z888 Allergy status to other drugs, medicaments and biological substances status: Secondary | ICD-10-CM | POA: Diagnosis not present

## 2018-08-13 DIAGNOSIS — I2510 Atherosclerotic heart disease of native coronary artery without angina pectoris: Secondary | ICD-10-CM | POA: Diagnosis not present

## 2018-08-13 DIAGNOSIS — R2232 Localized swelling, mass and lump, left upper limb: Secondary | ICD-10-CM | POA: Diagnosis not present

## 2018-08-13 DIAGNOSIS — Z881 Allergy status to other antibiotic agents status: Secondary | ICD-10-CM | POA: Diagnosis not present

## 2018-08-13 DIAGNOSIS — G8918 Other acute postprocedural pain: Secondary | ICD-10-CM | POA: Diagnosis not present

## 2018-08-13 DIAGNOSIS — M25522 Pain in left elbow: Secondary | ICD-10-CM | POA: Insufficient documentation

## 2018-08-13 DIAGNOSIS — L928 Other granulomatous disorders of the skin and subcutaneous tissue: Secondary | ICD-10-CM | POA: Insufficient documentation

## 2018-08-13 DIAGNOSIS — Z79899 Other long term (current) drug therapy: Secondary | ICD-10-CM | POA: Insufficient documentation

## 2018-08-13 DIAGNOSIS — M24022 Loose body in left elbow: Secondary | ICD-10-CM | POA: Insufficient documentation

## 2018-08-13 DIAGNOSIS — M19022 Primary osteoarthritis, left elbow: Secondary | ICD-10-CM | POA: Diagnosis not present

## 2018-08-13 DIAGNOSIS — I110 Hypertensive heart disease with heart failure: Secondary | ICD-10-CM | POA: Diagnosis not present

## 2018-08-13 DIAGNOSIS — D2112 Benign neoplasm of connective and other soft tissue of left upper limb, including shoulder: Secondary | ICD-10-CM | POA: Diagnosis not present

## 2018-08-13 DIAGNOSIS — E039 Hypothyroidism, unspecified: Secondary | ICD-10-CM | POA: Diagnosis not present

## 2018-08-13 DIAGNOSIS — Z794 Long term (current) use of insulin: Secondary | ICD-10-CM | POA: Insufficient documentation

## 2018-08-13 DIAGNOSIS — M7989 Other specified soft tissue disorders: Secondary | ICD-10-CM | POA: Diagnosis not present

## 2018-08-13 DIAGNOSIS — Z79890 Hormone replacement therapy (postmenopausal): Secondary | ICD-10-CM | POA: Diagnosis not present

## 2018-08-13 DIAGNOSIS — I5022 Chronic systolic (congestive) heart failure: Secondary | ICD-10-CM | POA: Insufficient documentation

## 2018-08-13 DIAGNOSIS — Z7952 Long term (current) use of systemic steroids: Secondary | ICD-10-CM | POA: Diagnosis not present

## 2018-08-13 DIAGNOSIS — M67222 Synovial hypertrophy, not elsewhere classified, left upper arm: Secondary | ICD-10-CM | POA: Diagnosis not present

## 2018-08-13 DIAGNOSIS — M109 Gout, unspecified: Secondary | ICD-10-CM | POA: Diagnosis not present

## 2018-08-13 HISTORY — PX: MASS EXCISION: SHX2000

## 2018-08-13 LAB — GLUCOSE, CAPILLARY
Glucose-Capillary: 102 mg/dL — ABNORMAL HIGH (ref 70–99)
Glucose-Capillary: 94 mg/dL (ref 70–99)

## 2018-08-13 SURGERY — EXCISION MASS
Anesthesia: General | Site: Elbow | Laterality: Left

## 2018-08-13 MED ORDER — MIDAZOLAM HCL 2 MG/2ML IJ SOLN
INTRAMUSCULAR | Status: DC | PRN
  Administered 2018-08-13 (×2): 1 mg via INTRAVENOUS

## 2018-08-13 MED ORDER — DEXAMETHASONE SODIUM PHOSPHATE 10 MG/ML IJ SOLN
INTRAMUSCULAR | Status: DC | PRN
  Administered 2018-08-13: 10 mg via INTRAVENOUS

## 2018-08-13 MED ORDER — HYDRALAZINE HCL 20 MG/ML IJ SOLN
INTRAMUSCULAR | Status: AC
  Filled 2018-08-13: qty 1

## 2018-08-13 MED ORDER — LIDOCAINE 2% (20 MG/ML) 5 ML SYRINGE
INTRAMUSCULAR | Status: DC | PRN
  Administered 2018-08-13: 100 mg via INTRAVENOUS

## 2018-08-13 MED ORDER — PHENYLEPHRINE 40 MCG/ML (10ML) SYRINGE FOR IV PUSH (FOR BLOOD PRESSURE SUPPORT)
PREFILLED_SYRINGE | INTRAVENOUS | Status: AC
  Filled 2018-08-13: qty 10

## 2018-08-13 MED ORDER — ACETAMINOPHEN 500 MG PO TABS
1000.0000 mg | ORAL_TABLET | Freq: Once | ORAL | Status: AC
  Administered 2018-08-13: 1000 mg via ORAL
  Filled 2018-08-13: qty 2

## 2018-08-13 MED ORDER — DEXAMETHASONE SODIUM PHOSPHATE 10 MG/ML IJ SOLN
INTRAMUSCULAR | Status: AC
  Filled 2018-08-13: qty 1

## 2018-08-13 MED ORDER — 0.9 % SODIUM CHLORIDE (POUR BTL) OPTIME
TOPICAL | Status: DC | PRN
  Administered 2018-08-13: 1000 mL

## 2018-08-13 MED ORDER — CEFAZOLIN SODIUM-DEXTROSE 2-4 GM/100ML-% IV SOLN
2.0000 g | INTRAVENOUS | Status: AC
  Administered 2018-08-13: 2 g via INTRAVENOUS
  Filled 2018-08-13: qty 100

## 2018-08-13 MED ORDER — CHLORHEXIDINE GLUCONATE 4 % EX LIQD: 60.0000 mL | Freq: Once | CUTANEOUS | Status: DC

## 2018-08-13 MED ORDER — HYDRALAZINE HCL 20 MG/ML IJ SOLN
INTRAMUSCULAR | Status: DC | PRN
  Administered 2018-08-13: 10 mg via INTRAVENOUS

## 2018-08-13 MED ORDER — HYDROCODONE-ACETAMINOPHEN 7.5-325 MG PO TABS: 1.0000 | ORAL_TABLET | Freq: Once | ORAL | Status: DC | PRN

## 2018-08-13 MED ORDER — LACTATED RINGERS IV SOLN
INTRAVENOUS | Status: DC | PRN
  Administered 2018-08-13: 07:00:00 via INTRAVENOUS

## 2018-08-13 MED ORDER — ONDANSETRON HCL 4 MG/2ML IJ SOLN
INTRAMUSCULAR | Status: DC | PRN
  Administered 2018-08-13: 4 mg via INTRAVENOUS

## 2018-08-13 MED ORDER — ONDANSETRON HCL 4 MG/2ML IJ SOLN
INTRAMUSCULAR | Status: AC
  Filled 2018-08-13: qty 2

## 2018-08-13 MED ORDER — FENTANYL CITRATE (PF) 100 MCG/2ML IJ SOLN
INTRAMUSCULAR | Status: DC | PRN
  Administered 2018-08-13: 50 ug via INTRAVENOUS
  Administered 2018-08-13 (×2): 25 ug via INTRAVENOUS
  Administered 2018-08-13: 50 ug via INTRAVENOUS

## 2018-08-13 MED ORDER — MIDAZOLAM HCL 2 MG/2ML IJ SOLN
INTRAMUSCULAR | Status: AC
  Filled 2018-08-13: qty 2

## 2018-08-13 MED ORDER — FENTANYL CITRATE (PF) 250 MCG/5ML IJ SOLN
INTRAMUSCULAR | Status: AC
  Filled 2018-08-13: qty 5

## 2018-08-13 MED ORDER — PROPOFOL 10 MG/ML IV BOLUS
INTRAVENOUS | Status: DC | PRN
  Administered 2018-08-13: 200 mg via INTRAVENOUS

## 2018-08-13 MED ORDER — ROPIVACAINE HCL 5 MG/ML IJ SOLN
INTRAMUSCULAR | Status: DC | PRN
  Administered 2018-08-13: 150 mg via PERINEURAL

## 2018-08-13 MED ORDER — PHENYLEPHRINE 40 MCG/ML (10ML) SYRINGE FOR IV PUSH (FOR BLOOD PRESSURE SUPPORT)
PREFILLED_SYRINGE | INTRAVENOUS | Status: DC | PRN
  Administered 2018-08-13 (×2): 120 ug via INTRAVENOUS
  Administered 2018-08-13: 80 ug via INTRAVENOUS

## 2018-08-13 MED ORDER — LIDOCAINE 2% (20 MG/ML) 5 ML SYRINGE
INTRAMUSCULAR | Status: AC
  Filled 2018-08-13: qty 5

## 2018-08-13 MED ORDER — PROPOFOL 10 MG/ML IV BOLUS
INTRAVENOUS | Status: AC
  Filled 2018-08-13: qty 20

## 2018-08-13 MED ORDER — ACETAMINOPHEN 10 MG/ML IV SOLN: 1000.0000 mg | Freq: Once | INTRAVENOUS | Status: DC | PRN

## 2018-08-13 MED ORDER — MEPERIDINE HCL 50 MG/ML IJ SOLN: 6.2500 mg | INTRAMUSCULAR | Status: DC | PRN

## 2018-08-13 MED ORDER — PROMETHAZINE HCL 25 MG/ML IJ SOLN: 6.2500 mg | INTRAMUSCULAR | Status: DC | PRN

## 2018-08-13 MED ORDER — HYDROMORPHONE HCL 1 MG/ML IJ SOLN: 0.2500 mg | INTRAMUSCULAR | Status: DC | PRN

## 2018-08-13 SURGICAL SUPPLY — 51 items
APL SKNCLS STERI-STRIP NONHPOA (GAUZE/BANDAGES/DRESSINGS) ×1
BANDAGE ACE 3X5.8 VEL STRL LF (GAUZE/BANDAGES/DRESSINGS) IMPLANT
BANDAGE ELASTIC 4 VELCRO ST LF (GAUZE/BANDAGES/DRESSINGS) ×2 IMPLANT
BENZOIN TINCTURE PRP APPL 2/3 (GAUZE/BANDAGES/DRESSINGS) ×2 IMPLANT
BNDG CMPR 9X4 STRL LF SNTH (GAUZE/BANDAGES/DRESSINGS) ×1
BNDG ESMARK 4X9 LF (GAUZE/BANDAGES/DRESSINGS) ×2 IMPLANT
BNDG GAUZE ELAST 4 BULKY (GAUZE/BANDAGES/DRESSINGS) ×2 IMPLANT
CORDS BIPOLAR (ELECTRODE) ×2 IMPLANT
COVER SURGICAL LIGHT HANDLE (MISCELLANEOUS) ×2 IMPLANT
CUFF TOURNIQUET SINGLE 18IN (TOURNIQUET CUFF) IMPLANT
CUFF TOURNIQUET SINGLE 24IN (TOURNIQUET CUFF) ×1 IMPLANT
DECANTER SPIKE VIAL GLASS SM (MISCELLANEOUS) IMPLANT
DRAPE OEC MINIVIEW 54X84 (DRAPES) IMPLANT
DRAPE SURG 17X23 STRL (DRAPES) ×2 IMPLANT
DURAPREP 26ML APPLICATOR (WOUND CARE) ×2 IMPLANT
GAUZE SPONGE 4X4 12PLY STRL (GAUZE/BANDAGES/DRESSINGS) IMPLANT
GAUZE SPONGE 4X4 12PLY STRL LF (GAUZE/BANDAGES/DRESSINGS) ×1 IMPLANT
GAUZE XEROFORM 1X8 LF (GAUZE/BANDAGES/DRESSINGS) IMPLANT
GLOVE SURG SYN 8.0 (GLOVE) ×2 IMPLANT
GOWN STRL REUS W/ TWL LRG LVL3 (GOWN DISPOSABLE) ×1 IMPLANT
GOWN STRL REUS W/ TWL XL LVL3 (GOWN DISPOSABLE) ×1 IMPLANT
GOWN STRL REUS W/TWL LRG LVL3 (GOWN DISPOSABLE) ×2
GOWN STRL REUS W/TWL XL LVL3 (GOWN DISPOSABLE) ×2
KIT BASIN OR (CUSTOM PROCEDURE TRAY) ×2 IMPLANT
KIT TURNOVER KIT B (KITS) ×2 IMPLANT
MANIFOLD NEPTUNE II (INSTRUMENTS) ×1 IMPLANT
NDL HYPO 25GX1X1/2 BEV (NEEDLE) IMPLANT
NEEDLE HYPO 25GX1X1/2 BEV (NEEDLE) IMPLANT
NS IRRIG 1000ML POUR BTL (IV SOLUTION) ×2 IMPLANT
PACK ORTHO EXTREMITY (CUSTOM PROCEDURE TRAY) ×2 IMPLANT
PAD ARMBOARD 7.5X6 YLW CONV (MISCELLANEOUS) ×4 IMPLANT
PAD CAST 3X4 CTTN HI CHSV (CAST SUPPLIES) IMPLANT
PAD CAST 4YDX4 CTTN HI CHSV (CAST SUPPLIES) ×1 IMPLANT
PADDING CAST COTTON 3X4 STRL (CAST SUPPLIES)
PADDING CAST COTTON 4X4 STRL (CAST SUPPLIES) ×2
SPECIMEN JAR SMALL (MISCELLANEOUS) ×2 IMPLANT
STRIP CLOSURE SKIN 1/2X4 (GAUZE/BANDAGES/DRESSINGS) ×2 IMPLANT
SUCTION FRAZIER HANDLE 10FR (MISCELLANEOUS)
SUCTION TUBE FRAZIER 10FR DISP (MISCELLANEOUS) IMPLANT
SUT ETHILON 5 0 PS 2 18 (SUTURE) IMPLANT
SUT PROLENE 3 0 PS 2 (SUTURE) ×2 IMPLANT
SUT VIC AB 2-0 FS1 27 (SUTURE) ×2 IMPLANT
SUT VIC AB 3-0 FS2 27 (SUTURE) IMPLANT
SUT VIC AB 4-0 P-3 18X BRD (SUTURE) IMPLANT
SUT VIC AB 4-0 P3 18 (SUTURE)
SYR CONTROL 10ML LL (SYRINGE) IMPLANT
TOWEL OR 17X24 6PK STRL BLUE (TOWEL DISPOSABLE) ×2 IMPLANT
TOWEL OR 17X26 10 PK STRL BLUE (TOWEL DISPOSABLE) ×2 IMPLANT
TUBE CONNECTING 12X1/4 (SUCTIONS) IMPLANT
UNDERPAD 30X30 (UNDERPADS AND DIAPERS) ×2 IMPLANT
WATER STERILE IRR 1000ML POUR (IV SOLUTION) ×2 IMPLANT

## 2018-08-13 NOTE — Anesthesia Procedure Notes (Signed)
Anesthesia Regional Block: Supraclavicular block   Pre-Anesthetic Checklist: ,, timeout performed, Correct Patient, Correct Site, Correct Laterality, Correct Procedure, Correct Position, site marked, Risks and benefits discussed,  Surgical consent,  Pre-op evaluation,  At surgeon's request and post-op pain management  Laterality: Left  Prep: chloraprep       Needles:  Injection technique: Single-shot  Needle Type: Echogenic Needle     Needle Length: 5cm  Needle Gauge: 21     Additional Needles:   Procedures:,,,, ultrasound used (permanent image in chart),,,,  Narrative:  Start time: 08/13/2018 7:10 AM End time: 08/13/2018 7:18 AM Injection made incrementally with aspirations every 5 mL.  Performed by: Personally  Anesthesiologist: Barnet Glasgow, MD

## 2018-08-13 NOTE — Transfer of Care (Signed)
Immediate Anesthesia Transfer of Care Note  Patient: David Gregory  Procedure(s) Performed: EXCISION OF LEFT ELBOW MASS (Left Elbow)  Patient Location: PACU  Anesthesia Type:GA combined with regional for post-op pain  Level of Consciousness: awake, alert  and oriented  Airway & Oxygen Therapy: Patient Spontanous Breathing and Patient connected to face mask oxygen  Post-op Assessment: Report given to RN and Post -op Vital signs reviewed and stable  Post vital signs: Reviewed and stable  Last Vitals:  Vitals Value Taken Time  BP    Temp    Pulse    Resp    SpO2      Last Pain:  Vitals:   08/13/18 0624  TempSrc: Oral  PainSc: 0-No pain         Complications: No apparent anesthesia complications

## 2018-08-13 NOTE — Addendum Note (Signed)
Addendum  created 08/13/18 1517 by Genelle Bal, CRNA   Intraprocedure Meds edited

## 2018-08-13 NOTE — Op Note (Signed)
NAMEKESHAN, REHA MEDICAL RECORD [id number] ACCOUNT [id number] DATE OF BIRTH:03-29-1966 FACILITY: [facility] LOCATION: [facility] PHYSICIAN:Atalaya Zappia A. Burney Gauze, MD  OPERATIVE REPORT  DATE OF PROCEDURE:  08/13/2018  PREOPERATIVE DIAGNOSIS:  Enlarging painful left elbow lateral mass.  POSTOPERATIVE DIAGNOSIS:  Enlarging painful left elbow lateral mass.  PROCEDURE:  Excisional biopsy, left elbow lateral mass, consistent with probable giant cell tumor versus gouty arthropathy with radiocapitellar joint arthrotomy and debridement.  SURGEON:  Charlotte Crumb, MD  ASSISTANT:  None.  ANESTHESIA:  Supraclavicular block and general.  COMPLICATIONS:  No complications.  DRAINS:  No drains.  SPECIMENS:  One specimen sent.  DESCRIPTION OF PROCEDURE:  The patient was taken to the operating suite after induction of adequate regional anesthetic and general laryngeal mask airway anesthetic.  The left upper extremity was prepped and draped in the usual sterile fashion.  An  Esmarch was used to exsanguinate the limb, and the tourniquet was inflated to 250 mmHg.  At this point in time, incision was made over the lateral aspect of the left elbow, centered over the radiocapitellar joint where a 4 x 3 cm mass was palpated.  We  incised longitudinally and dissected down to the common extensor origin.  We carefully dissected this mass down to a stalk, which was coming from the radiocapitellar joint.  We carefully did a lateral arthrotomy of the radiocapitellar joint and removed  this mass in its entirety.  It appeared to be consistent with a probable giant cell tumor versus gouty arthropathy.  We dissected into the radiocapitellar joint and removed several large loose bodies and debrided hypertrophic synovium.  The wound was  thoroughly irrigated.  We closed the capsule of the radiocapitellar joint with 2-0 undyed Vicryl.  We then closed subcutaneously with 2-0 undyed Vicryl and the skin with 3-0 Prolene  subcuticular stitch.  Steri-Strips, 4 x 4's, fluffs and a compressive  dressing were applied.  The patient tolerated this procedure well and went to recovery in stable fashion.  LN/NUANCE  D:08/13/2018 T:08/13/2018 JOB:[id number]

## 2018-08-13 NOTE — Op Note (Signed)
Please see dictated report [id number]

## 2018-08-13 NOTE — Discharge Instructions (Signed)

## 2018-08-13 NOTE — H&P (Signed)
David Gregory is an 52 y.o. male.   Chief Complaint: left elbow mass HPI: patient's a very pleasant 52 year old right-hand-dominant male with a chief complaint of a enlarging painful left elbow lateral mass consistent with probable gouty tophi  Past Medical History:  Diagnosis Date  . Arthritis    "Every where"  . Blood transfusion    with heart transplant  . Carpal tunnel syndrome    Right  . CHEST PAIN UNSPECIFIED   . CONGESTIVE HEART FAILURE UNSPECIFIED   . Diabetes mellitus   . Diarrhea   . Edema   . ERECTILE DYSFUNCTION, ORGANIC   . Gout, unspecified   . Hypertension   . Hypothyroidism   . Renal insufficiency    pt reports levels have been at 4.8  . SYSTOLIC HEART FAILURE, ACUTE ON CHRONIC   . SYSTOLIC HEART FAILURE, CHRONIC   . VENTRICULAR TACHYCARDIA     Past Surgical History:  Procedure Laterality Date  . CARPAL TUNNEL RELEASE  09/06/2012   Procedure: CARPAL TUNNEL RELEASE;  Surgeon: Wynonia Sours, MD;  Location: [place];  Service: Orthopedics;  Laterality: Right;  FOREARM BLOCK  . EYE SURGERY     "styl removed"  . FINGER ARTHRODESIS  11/26/2012   Procedure: ARTHRODESIS FINGER;  Surgeon: Wynonia Sours, MD;  Location: [place];  Service: Orthopedics;  Laterality: Right;  EXCISION TOPHUS FUSION DIP (Right Little Finger)   . FRACTURE SURGERY     bilateral arms and l ankle fixed  . HEART TRANSPLANT  feb/ 2012   Done at [facility] followed by Dr. Stann Mainland  . MASS EXCISION  01/27/2012   Procedure: EXCISION MASS;  Surgeon: Mcarthur Rossetti, MD;  Location: [place];  Service: Orthopedics;  Laterality: Left;  Excision left middle finger mass    Family History  Problem Relation Age of Onset  . Diabetes Mother   . Diabetes Father   . Heart disease Father   . Anesthesia problems Neg Hx    Social History:  reports that he quit smoking about 8 years ago. He has a 3.00 pack-year smoking history. He has never used smokeless tobacco. He reports that  he does not drink alcohol or use drugs.  Allergies:  Allergies  Allergen Reactions  . Clindamycin Hcl Shortness Of Breath, Itching and Rash    Wheezing and erythema/itching at IV site 01/27/12  . Prednisone Rash    Medications Prior to Admission  Medication Sig Dispense Refill  . amoxicillin (AMOXIL) 500 MG capsule Take 500 mg by mouth daily.    Marland Kitchen aspirin EC 81 MG tablet Take 81 mg by mouth daily.    . carvedilol (COREG) 25 MG tablet Take 25 mg by mouth 2 (two) times daily with a meal.     . cloNIDine (CATAPRES) 0.2 MG tablet Take 0.2 mg by mouth 2 (two) times daily.  11  . colchicine 0.6 MG tablet Take 0.6 mg by mouth daily as needed. For gout    . febuxostat (ULORIC) 40 MG tablet Take 80 mg by mouth daily.    . hydrALAZINE (APRESOLINE) 100 MG tablet Take 100 mg by mouth 2 (two) times daily.  11  . insulin NPH-insulin regular (NOVOLIN 70/30) (70-30) 100 UNIT/ML injection Inject 30 Units into the skin 2 (two) times daily with a meal.     . levothyroxine (SYNTHROID, LEVOTHROID) 25 MCG tablet Take 25 mcg by mouth daily.      . Multiple Vitamin (MULTIVITAMIN) tablet Take 1 tablet  by mouth daily.      . mycophenolate (CELLCEPT) 500 MG tablet Take 1,500 mg by mouth 2 (two) times daily.     . pantoprazole (PROTONIX) 40 MG tablet Take 40 mg by mouth daily.    . paricalcitol (ZEMPLAR) 1 MCG capsule Take 1 mcg by mouth daily.  11  . pravastatin (PRAVACHOL) 20 MG tablet Take 20 mg by mouth daily.      . predniSONE (DELTASONE) 10 MG tablet Take 5 mg by mouth daily.     . sodium bicarbonate 650 MG tablet Take 1,300 mg by mouth 2 (two) times daily.    . tacrolimus (PROGRAF) 1 MG capsule Take 5 mg by mouth See admin instructions. Taking 2 capsules (2mg  dose) in the Am and 3 capsules (3mg  dose) at bedtime. Total 5mg  daily    . torsemide (DEMADEX) 20 MG tablet Take 20 mg by mouth daily.     Marland Kitchen HYDROcodone-acetaminophen (NORCO) 5-325 MG per tablet Take 1 tablet by mouth every 6 (six) hours as needed for  pain. (Patient not taking: Reported on 08/09/2018) 30 tablet 0    Results for orders placed or performed during the hospital encounter of 08/13/18 (from the past 48 hour(s))  Glucose, capillary     Status: Abnormal   Collection Time: 08/13/18  6:22 AM  Result Value Ref Range   Glucose-Capillary 102 (H) 70 - 99 mg/dL   No results found.  Review of Systems  All other systems reviewed and are negative.   Blood pressure (!) 201/108, pulse 96, temperature 98.4 F (36.9 C), temperature source Oral, resp. rate 18, height 5\' 6"  (1.676 m), weight 94.3 kg, SpO2 100 %. Physical Exam  Constitutional: He is oriented to person, place, and time. He appears well-developed and well-nourished.  HENT:  Head: Normocephalic and atraumatic.  Neck: Normal range of motion.  Cardiovascular: Normal rate.  Respiratory: Effort normal.  Musculoskeletal:       Left elbow: He exhibits swelling. Tenderness found. Lateral epicondyle tenderness noted.  Left elbow posterior lateral mass consistent with probable gouty tophi  Neurological: He is alert and oriented to person, place, and time.  Skin: Skin is warm.  Psychiatric: He has a normal mood and affect. His behavior is normal. Judgment and thought content normal.     Assessment/Plan 52 year old male with enlarging painful mass lateral aspect left elbow. Have discussed excisional biopsy as an outpatient under regional anesthetic. Patient understands the risks and benefits and wishes to proceed.  Schuyler Amor, MD 08/13/2018, 7:18 AM

## 2018-08-13 NOTE — Anesthesia Postprocedure Evaluation (Signed)
Anesthesia Post Note  Patient: David Gregory  Procedure(s) Performed: EXCISION OF LEFT ELBOW MASS (Left Elbow)     Patient location during evaluation: PACU Anesthesia Type: General Level of consciousness: awake and alert Pain management: pain level controlled Vital Signs Assessment: post-procedure vital signs reviewed and stable Respiratory status: spontaneous breathing, nonlabored ventilation, respiratory function stable and patient connected to nasal cannula oxygen Cardiovascular status: blood pressure returned to baseline and stable Postop Assessment: no apparent nausea or vomiting Anesthetic complications: no    Last Vitals:  Vitals:   08/13/18 0857 08/13/18 0910  BP: (!) 151/96 (!) 154/97  Pulse: 89 90  Resp: 16 16  Temp:    SpO2: 92% 100%    Last Pain:  Vitals:   08/13/18 0910  TempSrc:   PainSc: 0-No pain                 Barnet Glasgow

## 2018-08-13 NOTE — Anesthesia Procedure Notes (Signed)
Procedure Name: LMA Insertion Date/Time: 08/13/2018 7:34 AM Performed by: Genelle Bal, CRNA Pre-anesthesia Checklist: Patient identified, Emergency Drugs available, Suction available and Patient being monitored Patient Re-evaluated:Patient Re-evaluated prior to induction Oxygen Delivery Method: Circle system utilized Preoxygenation: Pre-oxygenation with 100% oxygen Induction Type: IV induction Ventilation: Mask ventilation without difficulty LMA: LMA inserted LMA Size: 5.0 Number of attempts: 1 Airway Equipment and Method: Bite block Placement Confirmation: positive ETCO2 Tube secured with: Tape Dental Injury: Teeth and Oropharynx as per pre-operative assessment

## 2018-08-14 ENCOUNTER — Encounter (HOSPITAL_COMMUNITY): Payer: Self-pay | Admitting: Orthopedic Surgery

## 2018-09-14 DIAGNOSIS — Z941 Heart transplant status: Secondary | ICD-10-CM | POA: Diagnosis not present

## 2018-09-14 DIAGNOSIS — J9811 Atelectasis: Secondary | ICD-10-CM | POA: Diagnosis not present

## 2018-09-14 DIAGNOSIS — E785 Hyperlipidemia, unspecified: Secondary | ICD-10-CM | POA: Diagnosis not present

## 2018-09-14 DIAGNOSIS — D899 Disorder involving the immune mechanism, unspecified: Secondary | ICD-10-CM | POA: Diagnosis not present

## 2018-09-14 DIAGNOSIS — Z48298 Encounter for aftercare following other organ transplant: Secondary | ICD-10-CM | POA: Diagnosis not present

## 2018-09-14 DIAGNOSIS — N184 Chronic kidney disease, stage 4 (severe): Secondary | ICD-10-CM | POA: Diagnosis not present

## 2018-09-14 DIAGNOSIS — Z4821 Encounter for aftercare following heart transplant: Secondary | ICD-10-CM | POA: Diagnosis not present

## 2018-09-14 DIAGNOSIS — Z125 Encounter for screening for malignant neoplasm of prostate: Secondary | ICD-10-CM | POA: Diagnosis not present

## 2018-09-14 DIAGNOSIS — I1310 Hypertensive heart and chronic kidney disease without heart failure, with stage 1 through stage 4 chronic kidney disease, or unspecified chronic kidney disease: Secondary | ICD-10-CM | POA: Diagnosis not present

## 2018-09-14 DIAGNOSIS — D869 Sarcoidosis, unspecified: Secondary | ICD-10-CM | POA: Diagnosis not present

## 2018-09-14 DIAGNOSIS — I129 Hypertensive chronic kidney disease with stage 1 through stage 4 chronic kidney disease, or unspecified chronic kidney disease: Secondary | ICD-10-CM | POA: Diagnosis not present

## 2018-09-14 DIAGNOSIS — Z23 Encounter for immunization: Secondary | ICD-10-CM | POA: Diagnosis not present

## 2018-09-14 DIAGNOSIS — D649 Anemia, unspecified: Secondary | ICD-10-CM | POA: Diagnosis not present

## 2018-09-14 DIAGNOSIS — E119 Type 2 diabetes mellitus without complications: Secondary | ICD-10-CM | POA: Diagnosis not present

## 2018-09-14 DIAGNOSIS — I10 Essential (primary) hypertension: Secondary | ICD-10-CM | POA: Diagnosis not present

## 2018-09-14 DIAGNOSIS — Z01810 Encounter for preprocedural cardiovascular examination: Secondary | ICD-10-CM | POA: Diagnosis not present

## 2018-09-14 DIAGNOSIS — G4733 Obstructive sleep apnea (adult) (pediatric): Secondary | ICD-10-CM | POA: Diagnosis not present

## 2018-09-14 DIAGNOSIS — Z79899 Other long term (current) drug therapy: Secondary | ICD-10-CM | POA: Diagnosis not present

## 2018-09-14 DIAGNOSIS — N189 Chronic kidney disease, unspecified: Secondary | ICD-10-CM | POA: Diagnosis not present

## 2018-09-15 DIAGNOSIS — M150 Primary generalized (osteo)arthritis: Secondary | ICD-10-CM | POA: Diagnosis not present

## 2018-09-15 DIAGNOSIS — M1A09X0 Idiopathic chronic gout, multiple sites, without tophus (tophi): Secondary | ICD-10-CM | POA: Diagnosis not present

## 2018-09-15 DIAGNOSIS — I509 Heart failure, unspecified: Secondary | ICD-10-CM | POA: Diagnosis not present

## 2018-09-15 DIAGNOSIS — N183 Chronic kidney disease, stage 3 (moderate): Secondary | ICD-10-CM | POA: Diagnosis not present

## 2018-09-15 DIAGNOSIS — M25522 Pain in left elbow: Secondary | ICD-10-CM | POA: Diagnosis not present

## 2018-09-15 DIAGNOSIS — Z6839 Body mass index (BMI) 39.0-39.9, adult: Secondary | ICD-10-CM | POA: Diagnosis not present

## 2018-09-15 DIAGNOSIS — M25531 Pain in right wrist: Secondary | ICD-10-CM | POA: Diagnosis not present

## 2018-09-15 DIAGNOSIS — E669 Obesity, unspecified: Secondary | ICD-10-CM | POA: Diagnosis not present

## 2018-09-17 ENCOUNTER — Encounter: Payer: Self-pay | Admitting: Pulmonary Disease

## 2018-09-17 ENCOUNTER — Ambulatory Visit (INDEPENDENT_AMBULATORY_CARE_PROVIDER_SITE_OTHER): Payer: Medicare Other | Admitting: Pulmonary Disease

## 2018-09-17 VITALS — BP 120/74 | HR 94 | Ht 66.0 in | Wt 221.0 lb

## 2018-09-17 DIAGNOSIS — Z9989 Dependence on other enabling machines and devices: Secondary | ICD-10-CM

## 2018-09-17 DIAGNOSIS — G4733 Obstructive sleep apnea (adult) (pediatric): Secondary | ICD-10-CM | POA: Diagnosis not present

## 2018-09-17 DIAGNOSIS — E669 Obesity, unspecified: Secondary | ICD-10-CM

## 2018-09-17 DIAGNOSIS — G4730 Sleep apnea, unspecified: Secondary | ICD-10-CM

## 2018-09-17 NOTE — Progress Notes (Signed)
[facility]  Chief Complaint  Patient presents with  . Follow-up    12 month follow up for OSA. Uses [facility] as his DME. Denies any current issues.     Constitutional:  BP 120/74 (BP Location: Left Arm, Patient Position: Sitting, Cuff Size: Normal)   Pulse 94   Ht 5\' 6"  (1.676 m)   Wt 221 lb (100.2 kg)   SpO2 97%   BMI 35.67 kg/m   Past Medical History:  Chronic systolic heart failure s/p cardiac transplant 2012 followed at [facility], [place], HTN, CKD stage 4 followed at [facility], DM, Hypothyroidism, Gout  Brief Summary:  David Gregory is a 52 y.o. male with obstructive sleep apnea.  He uses CPAP nightly.  Has full face mask.  No issues with mask fit.  Denies sinus congestion, sore throat, dry mouth, or aerophagia.  Goes to bed at 9 pm and wakes up at 8 am.  Feels rested.  Uses bathroom twice per night.  Doesn't take naps.   Physical Exam:   Appearance - well kempt   ENMT - clear nasal mucosa, midline nasal  septum, no oral exudates, no LAN, trachea midline, MP 4, triangular uvula  Respiratory - normal chest wall, normal respiratory effort, no accessory muscle use, no wheeze/rales  CV - s1s2 regular rate and rhythm, no murmurs, no peripheral edema, radial pulses symmetric  GI - soft, non tender, no masses  Lymph - no adenopathy noted in neck and axillary areas  MSK - normal gait  Ext - no cyanosis, clubbing, or joint inflammation noted  Skin - no rashes, lesions, or ulcers  Neuro - normal strength, oriented x 3  Psych - normal mood and affect   Assessment/Plan:   Obstructive sleep apnea. - he reports compliance with CPAP and benefit from therapy - continue CPAP 15 cm H2O - will call him with download results  Obesity. - discussed options to assist with weight loss   Patient Instructions  Will call with results of CPAP download  Follow up in 1 year    Chesley Mires, MD [place] Pager:  [phone] 09/17/2018, 9:29 AM  Flow Sheet     Pulmonary tests:  Spirometry 12/22/13 >> FEV1 1.39 (51%), FEV1% 83 Spirometry 03/02/15 >> FEV1 1.67 (59%), FEV1% 83  Sleep tests:  PSG 04/27/12 >> AHI 139.5, SpO2 low 61%. CPAP 17 cm H2O. PLMI 0  ONO with CPAP and RA 07/07/12>>Test time 4 hr 59 min. Mean SpO2 91%, low SpO2 83%. Spent 44 min with SpO2 < 89%  08/10/12 Change to CPAP 15 cm H2O CPAP 10/26/15 to 11/24/15 >> used on 29 of 30 nights with average 8 hrs and 13 min.  Average AHI is 1.8 with CPAP 15 cm H2O.  Cardiac tests:  Echo 04/03/16 >> mild LVH, EF 55%   Medications:   Allergies as of 09/17/2018      Reactions   Clindamycin Hcl Shortness Of Breath, Itching, Rash   Wheezing and erythema/itching at IV site 01/27/12   Prednisone Rash      Medication List        Accurate as of 09/17/18  9:29 AM. Always use your most recent med list.          amoxicillin 500 MG capsule Commonly known as:  AMOXIL Take 500 mg by mouth daily.   aspirin EC 81 MG tablet Take 81 mg by mouth daily.   carvedilol 25 MG tablet Commonly known as:  COREG Take  25 mg by mouth 2 (two) times daily with a meal.   cloNIDine 0.2 MG tablet Commonly known as:  CATAPRES Take 0.2 mg by mouth 2 (two) times daily.   colchicine 0.6 MG tablet Take 0.6 mg by mouth daily as needed. For gout   febuxostat 40 MG tablet Commonly known as:  ULORIC Take 80 mg by mouth daily.   hydrALAZINE 100 MG tablet Commonly known as:  APRESOLINE Take 100 mg by mouth 2 (two) times daily.   HYDROcodone-acetaminophen 5-325 MG tablet Commonly known as:  NORCO/VICODIN Take 1 tablet by mouth every 6 (six) hours as needed for pain.   insulin NPH-regular Human (70-30) 100 UNIT/ML injection Commonly known as:  NOVOLIN 70/30 Inject 30 Units into the skin 2 (two) times daily with a meal.   levothyroxine 25 MCG tablet Commonly known as:  SYNTHROID, LEVOTHROID Take 25 mcg by mouth daily.   multivitamin tablet Take 1  tablet by mouth daily.   mycophenolate 500 MG tablet Commonly known as:  CELLCEPT Take 1,500 mg by mouth 2 (two) times daily.   pantoprazole 40 MG tablet Commonly known as:  PROTONIX Take 40 mg by mouth daily.   paricalcitol 1 MCG capsule Commonly known as:  ZEMPLAR Take 1 mcg by mouth daily.   pravastatin 20 MG tablet Commonly known as:  PRAVACHOL Take 20 mg by mouth daily.   predniSONE 10 MG tablet Commonly known as:  DELTASONE Take 5 mg by mouth daily.   sodium bicarbonate 650 MG tablet Take 1,300 mg by mouth 2 (two) times daily.   tacrolimus 1 MG capsule Commonly known as:  PROGRAF Take 5 mg by mouth See admin instructions. Taking 2 capsules (2mg  dose) in the Am and 3 capsules (3mg  dose) at bedtime. Total 5mg  daily   torsemide 20 MG tablet Commonly known as:  DEMADEX Take 20 mg by mouth daily.       Past Surgical History:  He  has a past surgical history that includes Heart transplant (feb/ 2012); Fracture surgery; Eye surgery; Mass excision (01/27/2012); Carpal tunnel release (09/06/2012); Finger arthodesis (11/26/2012); and Mass excision (Left, 08/13/2018).  Family History:  His family history includes Diabetes in his father and mother; Heart disease in his father.  Social History:  He  reports that he quit smoking about 9 years ago. He has a 3.00 pack-year smoking history. He has never used smokeless tobacco. He reports that he does not drink alcohol or use drugs.

## 2018-09-17 NOTE — Patient Instructions (Signed)
Will call with results of CPAP download  Follow up in 1 year 

## 2018-09-30 DIAGNOSIS — D631 Anemia in chronic kidney disease: Secondary | ICD-10-CM | POA: Diagnosis not present

## 2018-09-30 DIAGNOSIS — I10 Essential (primary) hypertension: Secondary | ICD-10-CM | POA: Diagnosis not present

## 2018-09-30 DIAGNOSIS — N184 Chronic kidney disease, stage 4 (severe): Secondary | ICD-10-CM | POA: Diagnosis not present

## 2018-11-10 ENCOUNTER — Telehealth: Payer: Self-pay | Admitting: Pulmonary Disease

## 2018-11-10 DIAGNOSIS — G4733 Obstructive sleep apnea (adult) (pediatric): Secondary | ICD-10-CM

## 2018-11-10 NOTE — Telephone Encounter (Signed)
Called and spoke with Patient. Patient was seen 09/17/18, by Dr. Halford Chessman.  He stated that he is needed new supplies from [facility].  New DME order sent for CPAP supplies.  Nothing further at this time.

## 2018-12-16 DIAGNOSIS — E872 Acidosis: Secondary | ICD-10-CM | POA: Diagnosis not present

## 2018-12-16 DIAGNOSIS — I10 Essential (primary) hypertension: Secondary | ICD-10-CM | POA: Diagnosis not present

## 2018-12-16 DIAGNOSIS — N184 Chronic kidney disease, stage 4 (severe): Secondary | ICD-10-CM | POA: Diagnosis not present

## 2018-12-16 DIAGNOSIS — D631 Anemia in chronic kidney disease: Secondary | ICD-10-CM | POA: Diagnosis not present

## 2018-12-22 DIAGNOSIS — M150 Primary generalized (osteo)arthritis: Secondary | ICD-10-CM | POA: Diagnosis not present

## 2018-12-22 DIAGNOSIS — M25531 Pain in right wrist: Secondary | ICD-10-CM | POA: Diagnosis not present

## 2018-12-22 DIAGNOSIS — N183 Chronic kidney disease, stage 3 (moderate): Secondary | ICD-10-CM | POA: Diagnosis not present

## 2018-12-22 DIAGNOSIS — Z6837 Body mass index (BMI) 37.0-37.9, adult: Secondary | ICD-10-CM | POA: Diagnosis not present

## 2018-12-22 DIAGNOSIS — E669 Obesity, unspecified: Secondary | ICD-10-CM | POA: Diagnosis not present

## 2018-12-22 DIAGNOSIS — M1A09X0 Idiopathic chronic gout, multiple sites, without tophus (tophi): Secondary | ICD-10-CM | POA: Diagnosis not present

## 2018-12-22 DIAGNOSIS — I509 Heart failure, unspecified: Secondary | ICD-10-CM | POA: Diagnosis not present

## 2018-12-22 DIAGNOSIS — M25522 Pain in left elbow: Secondary | ICD-10-CM | POA: Diagnosis not present

## 2018-12-28 DIAGNOSIS — D509 Iron deficiency anemia, unspecified: Secondary | ICD-10-CM | POA: Diagnosis not present

## 2019-01-05 DIAGNOSIS — D509 Iron deficiency anemia, unspecified: Secondary | ICD-10-CM | POA: Diagnosis not present

## 2019-02-03 DIAGNOSIS — R2232 Localized swelling, mass and lump, left upper limb: Secondary | ICD-10-CM | POA: Diagnosis not present

## 2019-04-20 DIAGNOSIS — D631 Anemia in chronic kidney disease: Secondary | ICD-10-CM | POA: Diagnosis not present

## 2019-04-20 DIAGNOSIS — N184 Chronic kidney disease, stage 4 (severe): Secondary | ICD-10-CM | POA: Diagnosis not present

## 2019-04-20 DIAGNOSIS — I10 Essential (primary) hypertension: Secondary | ICD-10-CM | POA: Diagnosis not present

## 2019-04-20 DIAGNOSIS — R2232 Localized swelling, mass and lump, left upper limb: Secondary | ICD-10-CM | POA: Diagnosis not present

## 2019-04-21 DIAGNOSIS — M25522 Pain in left elbow: Secondary | ICD-10-CM | POA: Diagnosis not present

## 2019-04-21 DIAGNOSIS — I509 Heart failure, unspecified: Secondary | ICD-10-CM | POA: Diagnosis not present

## 2019-04-21 DIAGNOSIS — M150 Primary generalized (osteo)arthritis: Secondary | ICD-10-CM | POA: Diagnosis not present

## 2019-04-21 DIAGNOSIS — N183 Chronic kidney disease, stage 3 (moderate): Secondary | ICD-10-CM | POA: Diagnosis not present

## 2019-04-21 DIAGNOSIS — M25531 Pain in right wrist: Secondary | ICD-10-CM | POA: Diagnosis not present

## 2019-04-21 DIAGNOSIS — M1A09X0 Idiopathic chronic gout, multiple sites, without tophus (tophi): Secondary | ICD-10-CM | POA: Diagnosis not present

## 2019-04-27 DIAGNOSIS — M67422 Ganglion, left elbow: Secondary | ICD-10-CM | POA: Diagnosis not present

## 2019-04-27 DIAGNOSIS — M25522 Pain in left elbow: Secondary | ICD-10-CM | POA: Diagnosis not present

## 2019-04-27 DIAGNOSIS — R2232 Localized swelling, mass and lump, left upper limb: Secondary | ICD-10-CM | POA: Diagnosis not present

## 2019-04-27 DIAGNOSIS — M12522 Traumatic arthropathy, left elbow: Secondary | ICD-10-CM | POA: Diagnosis not present

## 2019-06-16 DIAGNOSIS — I1311 Hypertensive heart and chronic kidney disease without heart failure, with stage 5 chronic kidney disease, or end stage renal disease: Secondary | ICD-10-CM | POA: Diagnosis not present

## 2019-06-16 DIAGNOSIS — Z48298 Encounter for aftercare following other organ transplant: Secondary | ICD-10-CM | POA: Diagnosis not present

## 2019-06-16 DIAGNOSIS — Z4821 Encounter for aftercare following heart transplant: Secondary | ICD-10-CM | POA: Diagnosis not present

## 2019-06-16 DIAGNOSIS — N185 Chronic kidney disease, stage 5: Secondary | ICD-10-CM | POA: Diagnosis not present

## 2019-06-16 DIAGNOSIS — Z941 Heart transplant status: Secondary | ICD-10-CM | POA: Diagnosis not present

## 2019-06-28 DIAGNOSIS — R2232 Localized swelling, mass and lump, left upper limb: Secondary | ICD-10-CM | POA: Diagnosis not present

## 2019-06-28 DIAGNOSIS — M1A0220 Idiopathic chronic gout, left elbow, without tophus (tophi): Secondary | ICD-10-CM | POA: Diagnosis not present

## 2019-06-28 DIAGNOSIS — M12522 Traumatic arthropathy, left elbow: Secondary | ICD-10-CM | POA: Diagnosis not present

## 2019-06-28 DIAGNOSIS — E119 Type 2 diabetes mellitus without complications: Secondary | ICD-10-CM | POA: Diagnosis not present

## 2019-07-05 DIAGNOSIS — N183 Chronic kidney disease, stage 3 (moderate): Secondary | ICD-10-CM | POA: Diagnosis not present

## 2019-07-05 DIAGNOSIS — M25531 Pain in right wrist: Secondary | ICD-10-CM | POA: Diagnosis not present

## 2019-07-05 DIAGNOSIS — E669 Obesity, unspecified: Secondary | ICD-10-CM | POA: Diagnosis not present

## 2019-07-05 DIAGNOSIS — M150 Primary generalized (osteo)arthritis: Secondary | ICD-10-CM | POA: Diagnosis not present

## 2019-07-05 DIAGNOSIS — M1A09X0 Idiopathic chronic gout, multiple sites, without tophus (tophi): Secondary | ICD-10-CM | POA: Diagnosis not present

## 2019-07-05 DIAGNOSIS — Z6838 Body mass index (BMI) 38.0-38.9, adult: Secondary | ICD-10-CM | POA: Diagnosis not present

## 2019-07-05 DIAGNOSIS — M25522 Pain in left elbow: Secondary | ICD-10-CM | POA: Diagnosis not present

## 2019-07-05 DIAGNOSIS — I509 Heart failure, unspecified: Secondary | ICD-10-CM | POA: Diagnosis not present

## 2019-07-22 DIAGNOSIS — E118 Type 2 diabetes mellitus with unspecified complications: Secondary | ICD-10-CM | POA: Diagnosis not present

## 2019-07-22 DIAGNOSIS — Z941 Heart transplant status: Secondary | ICD-10-CM | POA: Diagnosis not present

## 2019-07-22 DIAGNOSIS — I10 Essential (primary) hypertension: Secondary | ICD-10-CM | POA: Diagnosis not present

## 2019-07-22 DIAGNOSIS — N185 Chronic kidney disease, stage 5: Secondary | ICD-10-CM | POA: Diagnosis not present

## 2019-07-22 DIAGNOSIS — E782 Mixed hyperlipidemia: Secondary | ICD-10-CM | POA: Diagnosis not present

## 2019-07-22 DIAGNOSIS — E1165 Type 2 diabetes mellitus with hyperglycemia: Secondary | ICD-10-CM | POA: Diagnosis not present

## 2019-07-22 DIAGNOSIS — Z0000 Encounter for general adult medical examination without abnormal findings: Secondary | ICD-10-CM | POA: Diagnosis not present

## 2019-07-22 DIAGNOSIS — Z125 Encounter for screening for malignant neoplasm of prostate: Secondary | ICD-10-CM | POA: Diagnosis not present

## 2019-07-28 DIAGNOSIS — L708 Other acne: Secondary | ICD-10-CM | POA: Diagnosis not present

## 2019-07-28 DIAGNOSIS — L810 Postinflammatory hyperpigmentation: Secondary | ICD-10-CM | POA: Diagnosis not present

## 2019-07-28 DIAGNOSIS — D899 Disorder involving the immune mechanism, unspecified: Secondary | ICD-10-CM | POA: Diagnosis not present

## 2019-07-28 DIAGNOSIS — T380X5A Adverse effect of glucocorticoids and synthetic analogues, initial encounter: Secondary | ICD-10-CM | POA: Diagnosis not present

## 2019-07-28 DIAGNOSIS — L738 Other specified follicular disorders: Secondary | ICD-10-CM | POA: Diagnosis not present

## 2019-08-04 DIAGNOSIS — D631 Anemia in chronic kidney disease: Secondary | ICD-10-CM | POA: Diagnosis not present

## 2019-08-04 DIAGNOSIS — I10 Essential (primary) hypertension: Secondary | ICD-10-CM | POA: Diagnosis not present

## 2019-08-04 DIAGNOSIS — N184 Chronic kidney disease, stage 4 (severe): Secondary | ICD-10-CM | POA: Diagnosis not present

## 2019-08-04 DIAGNOSIS — N185 Chronic kidney disease, stage 5: Secondary | ICD-10-CM | POA: Diagnosis not present

## 2019-08-04 DIAGNOSIS — I120 Hypertensive chronic kidney disease with stage 5 chronic kidney disease or end stage renal disease: Secondary | ICD-10-CM | POA: Diagnosis not present

## 2019-08-04 DIAGNOSIS — R2232 Localized swelling, mass and lump, left upper limb: Secondary | ICD-10-CM | POA: Diagnosis not present

## 2019-08-04 DIAGNOSIS — Z23 Encounter for immunization: Secondary | ICD-10-CM | POA: Diagnosis not present

## 2019-08-17 DIAGNOSIS — D509 Iron deficiency anemia, unspecified: Secondary | ICD-10-CM | POA: Diagnosis not present

## 2019-09-05 DIAGNOSIS — I50812 Chronic right heart failure: Secondary | ICD-10-CM | POA: Diagnosis not present

## 2019-09-05 DIAGNOSIS — I472 Ventricular tachycardia: Secondary | ICD-10-CM | POA: Diagnosis not present

## 2019-09-05 DIAGNOSIS — Z8679 Personal history of other diseases of the circulatory system: Secondary | ICD-10-CM | POA: Diagnosis not present

## 2019-09-05 DIAGNOSIS — I10 Essential (primary) hypertension: Secondary | ICD-10-CM | POA: Diagnosis not present

## 2019-09-05 DIAGNOSIS — E119 Type 2 diabetes mellitus without complications: Secondary | ICD-10-CM | POA: Diagnosis not present

## 2019-09-05 DIAGNOSIS — Z01818 Encounter for other preprocedural examination: Secondary | ICD-10-CM | POA: Diagnosis not present

## 2019-09-05 DIAGNOSIS — E039 Hypothyroidism, unspecified: Secondary | ICD-10-CM | POA: Diagnosis not present

## 2019-09-05 DIAGNOSIS — Z941 Heart transplant status: Secondary | ICD-10-CM | POA: Diagnosis not present

## 2019-09-05 DIAGNOSIS — G4733 Obstructive sleep apnea (adult) (pediatric): Secondary | ICD-10-CM | POA: Diagnosis not present

## 2019-09-05 DIAGNOSIS — D509 Iron deficiency anemia, unspecified: Secondary | ICD-10-CM | POA: Diagnosis not present

## 2019-09-05 DIAGNOSIS — M109 Gout, unspecified: Secondary | ICD-10-CM | POA: Diagnosis not present

## 2019-09-05 DIAGNOSIS — L708 Other acne: Secondary | ICD-10-CM | POA: Diagnosis not present

## 2019-09-05 DIAGNOSIS — D631 Anemia in chronic kidney disease: Secondary | ICD-10-CM | POA: Diagnosis not present

## 2019-09-05 DIAGNOSIS — D849 Immunodeficiency, unspecified: Secondary | ICD-10-CM | POA: Diagnosis not present

## 2019-09-05 DIAGNOSIS — E785 Hyperlipidemia, unspecified: Secondary | ICD-10-CM | POA: Diagnosis not present

## 2019-09-05 DIAGNOSIS — N184 Chronic kidney disease, stage 4 (severe): Secondary | ICD-10-CM | POA: Diagnosis not present

## 2019-09-05 DIAGNOSIS — T380X5A Adverse effect of glucocorticoids and synthetic analogues, initial encounter: Secondary | ICD-10-CM | POA: Diagnosis not present

## 2019-09-14 DIAGNOSIS — D509 Iron deficiency anemia, unspecified: Secondary | ICD-10-CM | POA: Diagnosis not present

## 2019-09-25 DIAGNOSIS — N186 End stage renal disease: Secondary | ICD-10-CM | POA: Diagnosis not present

## 2019-09-25 DIAGNOSIS — T8620 Unspecified complication of heart transplant: Secondary | ICD-10-CM | POA: Diagnosis not present

## 2019-09-25 DIAGNOSIS — Z992 Dependence on renal dialysis: Secondary | ICD-10-CM | POA: Diagnosis not present

## 2019-09-26 DIAGNOSIS — N186 End stage renal disease: Secondary | ICD-10-CM | POA: Diagnosis not present

## 2019-09-26 DIAGNOSIS — M109 Gout, unspecified: Secondary | ICD-10-CM | POA: Diagnosis present

## 2019-09-26 DIAGNOSIS — I10 Essential (primary) hypertension: Secondary | ICD-10-CM | POA: Diagnosis not present

## 2019-09-26 DIAGNOSIS — R918 Other nonspecific abnormal finding of lung field: Secondary | ICD-10-CM | POA: Diagnosis not present

## 2019-09-26 DIAGNOSIS — Z20828 Contact with and (suspected) exposure to other viral communicable diseases: Secondary | ICD-10-CM | POA: Diagnosis present

## 2019-09-26 DIAGNOSIS — Z6836 Body mass index (BMI) 36.0-36.9, adult: Secondary | ICD-10-CM | POA: Diagnosis not present

## 2019-09-26 DIAGNOSIS — I50810 Right heart failure, unspecified: Secondary | ICD-10-CM | POA: Diagnosis present

## 2019-09-26 DIAGNOSIS — E039 Hypothyroidism, unspecified: Secondary | ICD-10-CM | POA: Diagnosis present

## 2019-09-26 DIAGNOSIS — Z7982 Long term (current) use of aspirin: Secondary | ICD-10-CM | POA: Diagnosis not present

## 2019-09-26 DIAGNOSIS — I2729 Other secondary pulmonary hypertension: Secondary | ICD-10-CM | POA: Diagnosis present

## 2019-09-26 DIAGNOSIS — M069 Rheumatoid arthritis, unspecified: Secondary | ICD-10-CM | POA: Diagnosis present

## 2019-09-26 DIAGNOSIS — G4733 Obstructive sleep apnea (adult) (pediatric): Secondary | ICD-10-CM | POA: Diagnosis present

## 2019-09-26 DIAGNOSIS — E1122 Type 2 diabetes mellitus with diabetic chronic kidney disease: Secondary | ICD-10-CM | POA: Diagnosis present

## 2019-09-26 DIAGNOSIS — Z794 Long term (current) use of insulin: Secondary | ICD-10-CM | POA: Diagnosis not present

## 2019-09-26 DIAGNOSIS — N185 Chronic kidney disease, stage 5: Secondary | ICD-10-CM | POA: Diagnosis not present

## 2019-09-26 DIAGNOSIS — K219 Gastro-esophageal reflux disease without esophagitis: Secondary | ICD-10-CM | POA: Diagnosis present

## 2019-09-26 DIAGNOSIS — Z87891 Personal history of nicotine dependence: Secondary | ICD-10-CM | POA: Diagnosis not present

## 2019-09-26 DIAGNOSIS — D849 Immunodeficiency, unspecified: Secondary | ICD-10-CM | POA: Diagnosis not present

## 2019-09-26 DIAGNOSIS — R2232 Localized swelling, mass and lump, left upper limb: Secondary | ICD-10-CM | POA: Diagnosis present

## 2019-09-26 DIAGNOSIS — Z79899 Other long term (current) drug therapy: Secondary | ICD-10-CM | POA: Diagnosis not present

## 2019-09-26 DIAGNOSIS — Z4901 Encounter for fitting and adjustment of extracorporeal dialysis catheter: Secondary | ICD-10-CM | POA: Diagnosis not present

## 2019-09-26 DIAGNOSIS — D509 Iron deficiency anemia, unspecified: Secondary | ICD-10-CM | POA: Diagnosis present

## 2019-09-26 DIAGNOSIS — Z992 Dependence on renal dialysis: Secondary | ICD-10-CM | POA: Diagnosis not present

## 2019-09-26 DIAGNOSIS — I132 Hypertensive heart and chronic kidney disease with heart failure and with stage 5 chronic kidney disease, or end stage renal disease: Secondary | ICD-10-CM | POA: Diagnosis present

## 2019-09-26 DIAGNOSIS — Z941 Heart transplant status: Secondary | ICD-10-CM | POA: Diagnosis not present

## 2019-09-26 DIAGNOSIS — N2581 Secondary hyperparathyroidism of renal origin: Secondary | ICD-10-CM | POA: Diagnosis present

## 2019-09-26 DIAGNOSIS — N250 Renal osteodystrophy: Secondary | ICD-10-CM | POA: Diagnosis present

## 2019-09-26 DIAGNOSIS — D869 Sarcoidosis, unspecified: Secondary | ICD-10-CM | POA: Diagnosis present

## 2019-09-28 ENCOUNTER — Ambulatory Visit: Payer: Medicare Other | Admitting: Pulmonary Disease

## 2019-09-28 MED ORDER — DEXTROSE 50 % IV SOLN
12.50 | INTRAVENOUS | Status: DC
Start: ? — End: 2019-09-28

## 2019-09-28 MED ORDER — FEBUXOSTAT 40 MG PO TABS
40.00 | ORAL_TABLET | ORAL | Status: DC
Start: 2019-09-29 — End: 2019-09-28

## 2019-09-28 MED ORDER — LEVOTHYROXINE SODIUM 25 MCG PO TABS
25.00 | ORAL_TABLET | ORAL | Status: DC
Start: 2019-09-29 — End: 2019-09-28

## 2019-09-28 MED ORDER — PREDNISONE 5 MG PO TABS
5.00 | ORAL_TABLET | ORAL | Status: DC
Start: 2019-09-29 — End: 2019-09-28

## 2019-09-28 MED ORDER — ASPIRIN EC 81 MG PO TBEC
81.00 | DELAYED_RELEASE_TABLET | ORAL | Status: DC
Start: 2019-09-29 — End: 2019-09-28

## 2019-09-28 MED ORDER — HEPARIN SODIUM (PORCINE) 1000 UNIT/ML IJ SOLN
INTRAMUSCULAR | Status: DC
Start: ? — End: 2019-09-28

## 2019-09-28 MED ORDER — GENERIC EXTERNAL MEDICATION
5000.00 | Status: DC
Start: ? — End: 2019-09-28

## 2019-09-28 MED ORDER — HYDRALAZINE HCL 50 MG PO TABS
100.00 | ORAL_TABLET | ORAL | Status: DC
Start: 2019-09-28 — End: 2019-09-28

## 2019-09-28 MED ORDER — TACROLIMUS ER 4 MG PO TB24
4.00 | ORAL_TABLET | ORAL | Status: DC
Start: 2019-09-29 — End: 2019-09-28

## 2019-09-28 MED ORDER — INSULIN LISPRO 100 UNIT/ML ~~LOC~~ SOLN
0.00 | SUBCUTANEOUS | Status: DC
Start: 2019-09-29 — End: 2019-09-28

## 2019-09-28 MED ORDER — SODIUM BICARBONATE 650 MG PO TABS
1300.00 | ORAL_TABLET | ORAL | Status: DC
Start: 2019-09-28 — End: 2019-09-28

## 2019-09-28 MED ORDER — MELATONIN 3 MG PO TABS
3.00 | ORAL_TABLET | ORAL | Status: DC
Start: ? — End: 2019-09-28

## 2019-09-28 MED ORDER — MYCOPHENOLATE MOFETIL 500 MG PO TABS
500.00 | ORAL_TABLET | ORAL | Status: DC
Start: 2019-09-28 — End: 2019-09-28

## 2019-09-28 MED ORDER — ACETAMINOPHEN 325 MG PO TABS
650.00 | ORAL_TABLET | ORAL | Status: DC
Start: ? — End: 2019-09-28

## 2019-09-28 MED ORDER — PARICALCITOL 1 MCG PO CAPS
1.00 | ORAL_CAPSULE | ORAL | Status: DC
Start: 2019-09-29 — End: 2019-09-28

## 2019-09-28 MED ORDER — INSULIN LISPRO 100 UNIT/ML ~~LOC~~ SOLN
2.00 | SUBCUTANEOUS | Status: DC
Start: 2019-09-29 — End: 2019-09-28

## 2019-09-28 MED ORDER — PRAVASTATIN SODIUM 20 MG PO TABS
20.00 | ORAL_TABLET | ORAL | Status: DC
Start: 2019-09-28 — End: 2019-09-28

## 2019-09-28 MED ORDER — INSULIN GLARGINE 100 UNIT/ML ~~LOC~~ SOLN
7.00 | SUBCUTANEOUS | Status: DC
Start: 2019-09-28 — End: 2019-09-28

## 2019-09-28 MED ORDER — TORSEMIDE 20 MG PO TABS
20.00 | ORAL_TABLET | ORAL | Status: DC
Start: 2019-09-29 — End: 2019-09-28

## 2019-09-28 MED ORDER — GENERIC EXTERNAL MEDICATION
0.30 | Status: DC
Start: 2019-09-29 — End: 2019-09-28

## 2019-09-28 MED ORDER — HYDROCODONE-ACETAMINOPHEN 5-325 MG PO TABS
1.00 | ORAL_TABLET | ORAL | Status: DC
Start: ? — End: 2019-09-28

## 2019-09-28 MED ORDER — PANTOPRAZOLE SODIUM 40 MG PO TBEC
40.00 | DELAYED_RELEASE_TABLET | ORAL | Status: DC
Start: 2019-09-29 — End: 2019-09-28

## 2019-09-28 MED ORDER — GLUCAGON HCL RDNA (DIAGNOSTIC) 1 MG IJ SOLR
1.00 | INTRAMUSCULAR | Status: DC
Start: ? — End: 2019-09-28

## 2019-09-28 MED ORDER — SILDENAFIL CITRATE 20 MG PO TABS
20.00 | ORAL_TABLET | ORAL | Status: DC
Start: 2019-09-28 — End: 2019-09-28

## 2019-09-28 MED ORDER — AMOXICILLIN 500 MG PO CAPS
500.00 | ORAL_CAPSULE | ORAL | Status: DC
Start: 2019-09-28 — End: 2019-09-28

## 2019-09-28 MED ORDER — CARVEDILOL 25 MG PO TABS
25.00 | ORAL_TABLET | ORAL | Status: DC
Start: 2019-09-29 — End: 2019-09-28

## 2019-10-01 DIAGNOSIS — D509 Iron deficiency anemia, unspecified: Secondary | ICD-10-CM | POA: Diagnosis not present

## 2019-10-01 DIAGNOSIS — Z992 Dependence on renal dialysis: Secondary | ICD-10-CM | POA: Diagnosis not present

## 2019-10-01 DIAGNOSIS — N186 End stage renal disease: Secondary | ICD-10-CM | POA: Diagnosis not present

## 2019-10-01 DIAGNOSIS — D631 Anemia in chronic kidney disease: Secondary | ICD-10-CM | POA: Diagnosis not present

## 2019-10-01 DIAGNOSIS — N2581 Secondary hyperparathyroidism of renal origin: Secondary | ICD-10-CM | POA: Diagnosis not present

## 2019-10-03 ENCOUNTER — Ambulatory Visit (INDEPENDENT_AMBULATORY_CARE_PROVIDER_SITE_OTHER): Payer: Medicare Other | Admitting: Pulmonary Disease

## 2019-10-03 ENCOUNTER — Other Ambulatory Visit: Payer: Self-pay

## 2019-10-03 ENCOUNTER — Encounter: Payer: Self-pay | Admitting: Pulmonary Disease

## 2019-10-03 VITALS — BP 138/80 | HR 76 | Temp 97.9°F | Ht 66.0 in | Wt 212.4 lb

## 2019-10-03 DIAGNOSIS — Z9989 Dependence on other enabling machines and devices: Secondary | ICD-10-CM

## 2019-10-03 DIAGNOSIS — G4733 Obstructive sleep apnea (adult) (pediatric): Secondary | ICD-10-CM

## 2019-10-03 NOTE — Progress Notes (Signed)
[facility]  Chief Complaint  Patient presents with  . OSA on CPAP    Constitutional:  BP 138/80 (BP Location: Right Arm, Patient Position: Sitting, Cuff Size: Normal)   Pulse 76   Temp 97.9 F (36.6 C)   Ht 5\' 6"  (1.676 m)   Wt 212 lb 6.4 oz (96.3 kg)   SpO2 97% Comment: on room air  BMI 34.28 kg/m   Past Medical History:  Chronic systolic heart failure s/p cardiac transplant 2012 followed at [facility], [place], HTN, ESRD followed at [facility], DM, Hypothyroidism, Gout  Brief Summary:  David Gregory is a 53 y.o. male with obstructive sleep apnea.  Since I saw him last he was started on dialysis.  Still has Hickman catheter with plan for AV fistula.  He uses CPAP nightly.  He wants to get new DME.  [facility].  [facility] sent him the wrong size mask.  When he got replacement mask that was correct size, he was told he had to pay for new mask and then got charged a late fee.  Physical Exam:   Appearance - well kempt   ENMT - no sinus tenderness, no nasal discharge, no oral exudate  Neck - no masses, trachea midline, no thyromegaly, no elevation in JVP  Respiratory - normal appearance of chest wall, normal respiratory effort w/o accessory muscle use, no dullness on percussion, no wheezing or rales  CV - s1s2 regular rate and rhythm, no murmurs, no peripheral edema, radial pulses symmetric  GI - soft, non tender  Lymph - no adenopathy noted in neck and axillary areas  MSK - normal gait  Ext - no cyanosis, clubbing, or joint inflammation noted  Skin - no rashes, lesions, or ulcers  Neuro - normal strength, oriented x 3  Psych - normal mood and affect   Assessment/Plan:   Obstructive sleep apnea. - he is compliant with CPAP and reports benefit from therapy - will arrange for new DME company for CPAP supplies - continue CPAP 15 cm H2O  Obesity. - discussed options to assist with weight  loss   Patient Instructions  Will arrange for new home care company for CPAP supplies  Follow up in 1 year    Chesley Mires, MD [place] Pager: [phone] 10/03/2019, 10:31 AM  Flow Sheet     Pulmonary tests:  Spirometry 12/22/13 >> FEV1 1.39 (51%), FEV1% 83 Spirometry 03/02/15 >> FEV1 1.67 (59%), FEV1% 83  Sleep tests:  PSG 04/27/12 >> AHI 139.5, SpO2 low 61%. CPAP 17 cm H2O. PLMI 0  ONO with CPAP and RA 07/07/12>>Test time 4 hr 59 min. Mean SpO2 91%, low SpO2 83%. Spent 44 min with SpO2 < 89%  08/10/12 Change to CPAP 15 cm H2O CPAP 10/26/15 to 11/24/15 >> used on 29 of 30 nights with average 8 hrs and 13 min.  Average AHI is 1.8 with CPAP 15 cm H2O.  Cardiac tests:  Echo 04/03/16 >> mild LVH, EF 55%  Medications:   Allergies as of 10/03/2019      Reactions   Clindamycin Hcl Shortness Of Breath, Itching, Rash   Wheezing and erythema/itching at IV site 01/27/12   Prednisone Rash      Medication List       Accurate as of October 03, 2019 10:31 AM. If you have any questions, ask your nurse or doctor.        STOP taking these medications  paricalcitol 1 MCG capsule Commonly known as: ZEMPLAR Stopped by: Chesley Mires, MD     TAKE these medications   amoxicillin 500 MG capsule Commonly known as: AMOXIL Take 500 mg by mouth daily.   aspirin EC 81 MG tablet Take 81 mg by mouth daily.   carvedilol 25 MG tablet Commonly known as: COREG Take 25 mg by mouth 2 (two) times daily with a meal.   cloNIDine 0.3 MG tablet Commonly known as: CATAPRES Take 1 tablet by mouth 2 (two) times daily. What changed: Another medication with the same name was removed. Continue taking this medication, and follow the directions you see here. Changed by: Chesley Mires, MD   colchicine 0.6 MG tablet Take 0.6 mg by mouth daily as needed. For gout   febuxostat 40 MG tablet Commonly known as: ULORIC Take 80 mg by mouth daily.   hydrALAZINE 100 MG  tablet Commonly known as: APRESOLINE Take 100 mg by mouth 2 (two) times daily.   HYDROcodone-acetaminophen 5-325 MG tablet Commonly known as: Norco Take 1 tablet by mouth every 6 (six) hours as needed for pain.   insulin NPH-regular Human (70-30) 100 UNIT/ML injection Inject 30 Units into the skin 2 (two) times daily with a meal.   levothyroxine 25 MCG tablet Commonly known as: SYNTHROID Take 25 mcg by mouth daily.   multivitamin tablet Take 1 tablet by mouth daily.   mycophenolate 500 MG tablet Commonly known as: CELLCEPT Take 1,500 mg by mouth 2 (two) times daily.   pantoprazole 40 MG tablet Commonly known as: PROTONIX Take 40 mg by mouth daily.   pravastatin 20 MG tablet Commonly known as: PRAVACHOL Take 20 mg by mouth daily.   predniSONE 10 MG tablet Commonly known as: DELTASONE Take 5 mg by mouth daily.   tacrolimus 1 MG capsule Commonly known as: PROGRAF Take 5 mg by mouth See admin instructions. Taking 2 capsules (2mg  dose) in the Am and 3 capsules (3mg  dose) at bedtime. Total 5mg  daily   torsemide 20 MG tablet Commonly known as: DEMADEX Take 20 mg by mouth daily.       Past Surgical History:  He  has a past surgical history that includes Heart transplant (feb/ 2012); Fracture surgery; Eye surgery; Mass excision (01/27/2012); Carpal tunnel release (09/06/2012); Finger arthodesis (11/26/2012); and Mass excision (Left, 08/13/2018).  Family History:  His family history includes Diabetes in his father and mother; Heart disease in his father.  Social History:  He  reports that he quit smoking about 10 years ago. He has a 3.00 pack-year smoking history. He has never used smokeless tobacco. He reports that he does not drink alcohol or use drugs.

## 2019-10-03 NOTE — Patient Instructions (Signed)
Will arrange for new home care company for CPAP supplies  Follow up in 1 year 

## 2019-10-04 ENCOUNTER — Ambulatory Visit: Payer: Medicare Other | Admitting: Pulmonary Disease

## 2019-10-04 DIAGNOSIS — N2581 Secondary hyperparathyroidism of renal origin: Secondary | ICD-10-CM | POA: Diagnosis not present

## 2019-10-04 DIAGNOSIS — E119 Type 2 diabetes mellitus without complications: Secondary | ICD-10-CM | POA: Diagnosis not present

## 2019-10-04 DIAGNOSIS — D509 Iron deficiency anemia, unspecified: Secondary | ICD-10-CM | POA: Diagnosis not present

## 2019-10-04 DIAGNOSIS — N186 End stage renal disease: Secondary | ICD-10-CM | POA: Diagnosis not present

## 2019-10-04 DIAGNOSIS — D631 Anemia in chronic kidney disease: Secondary | ICD-10-CM | POA: Diagnosis not present

## 2019-10-04 DIAGNOSIS — Z992 Dependence on renal dialysis: Secondary | ICD-10-CM | POA: Diagnosis not present

## 2019-10-05 DIAGNOSIS — N183 Chronic kidney disease, stage 3 unspecified: Secondary | ICD-10-CM | POA: Diagnosis not present

## 2019-10-05 DIAGNOSIS — M25522 Pain in left elbow: Secondary | ICD-10-CM | POA: Diagnosis not present

## 2019-10-05 DIAGNOSIS — M25531 Pain in right wrist: Secondary | ICD-10-CM | POA: Diagnosis not present

## 2019-10-05 DIAGNOSIS — M1A09X0 Idiopathic chronic gout, multiple sites, without tophus (tophi): Secondary | ICD-10-CM | POA: Diagnosis not present

## 2019-10-05 DIAGNOSIS — M150 Primary generalized (osteo)arthritis: Secondary | ICD-10-CM | POA: Diagnosis not present

## 2019-10-05 DIAGNOSIS — E669 Obesity, unspecified: Secondary | ICD-10-CM | POA: Diagnosis not present

## 2019-10-05 DIAGNOSIS — Z6838 Body mass index (BMI) 38.0-38.9, adult: Secondary | ICD-10-CM | POA: Diagnosis not present

## 2019-10-05 DIAGNOSIS — I509 Heart failure, unspecified: Secondary | ICD-10-CM | POA: Diagnosis not present

## 2019-10-06 DIAGNOSIS — N2581 Secondary hyperparathyroidism of renal origin: Secondary | ICD-10-CM | POA: Diagnosis not present

## 2019-10-06 DIAGNOSIS — D509 Iron deficiency anemia, unspecified: Secondary | ICD-10-CM | POA: Diagnosis not present

## 2019-10-06 DIAGNOSIS — D631 Anemia in chronic kidney disease: Secondary | ICD-10-CM | POA: Diagnosis not present

## 2019-10-06 DIAGNOSIS — Z992 Dependence on renal dialysis: Secondary | ICD-10-CM | POA: Diagnosis not present

## 2019-10-06 DIAGNOSIS — N186 End stage renal disease: Secondary | ICD-10-CM | POA: Diagnosis not present

## 2019-10-07 ENCOUNTER — Telehealth: Payer: Self-pay | Admitting: Pulmonary Disease

## 2019-10-07 NOTE — Telephone Encounter (Signed)
DL printed and put in VS's lookat to be reviewed  I placed the pt's chip back in envelope and up front to be picked back up

## 2019-10-08 DIAGNOSIS — D509 Iron deficiency anemia, unspecified: Secondary | ICD-10-CM | POA: Diagnosis not present

## 2019-10-08 DIAGNOSIS — N186 End stage renal disease: Secondary | ICD-10-CM | POA: Diagnosis not present

## 2019-10-08 DIAGNOSIS — D631 Anemia in chronic kidney disease: Secondary | ICD-10-CM | POA: Diagnosis not present

## 2019-10-08 DIAGNOSIS — Z992 Dependence on renal dialysis: Secondary | ICD-10-CM | POA: Diagnosis not present

## 2019-10-08 DIAGNOSIS — N2581 Secondary hyperparathyroidism of renal origin: Secondary | ICD-10-CM | POA: Diagnosis not present

## 2019-10-10 NOTE — Telephone Encounter (Signed)
CPAP 07/09/19 to 10/06/19 >> used on 87 of 90 nights with average 10 hrs 17 min.  Average AHI 1.3 with CPAP 15 cm H2O   Please let him know his CPAP report shows good control of sleep apnea with current settings.

## 2019-10-10 NOTE — Telephone Encounter (Signed)
LMOMTCB x 1 

## 2019-10-11 DIAGNOSIS — N2581 Secondary hyperparathyroidism of renal origin: Secondary | ICD-10-CM | POA: Diagnosis not present

## 2019-10-11 DIAGNOSIS — Z992 Dependence on renal dialysis: Secondary | ICD-10-CM | POA: Diagnosis not present

## 2019-10-11 DIAGNOSIS — N186 End stage renal disease: Secondary | ICD-10-CM | POA: Diagnosis not present

## 2019-10-11 DIAGNOSIS — D509 Iron deficiency anemia, unspecified: Secondary | ICD-10-CM | POA: Diagnosis not present

## 2019-10-11 DIAGNOSIS — D631 Anemia in chronic kidney disease: Secondary | ICD-10-CM | POA: Diagnosis not present

## 2019-10-11 NOTE — Telephone Encounter (Signed)
LMTCB x2 for pt 

## 2019-10-12 DIAGNOSIS — D509 Iron deficiency anemia, unspecified: Secondary | ICD-10-CM | POA: Diagnosis not present

## 2019-10-12 NOTE — Telephone Encounter (Signed)
LMTCB x3 for pt. We have attempted to contact pt several times with no success or call back from pt. Per triage protocol, message will be closed.   

## 2019-10-13 DIAGNOSIS — Z992 Dependence on renal dialysis: Secondary | ICD-10-CM | POA: Diagnosis not present

## 2019-10-13 DIAGNOSIS — D631 Anemia in chronic kidney disease: Secondary | ICD-10-CM | POA: Diagnosis not present

## 2019-10-13 DIAGNOSIS — D509 Iron deficiency anemia, unspecified: Secondary | ICD-10-CM | POA: Diagnosis not present

## 2019-10-13 DIAGNOSIS — N2581 Secondary hyperparathyroidism of renal origin: Secondary | ICD-10-CM | POA: Diagnosis not present

## 2019-10-13 DIAGNOSIS — N186 End stage renal disease: Secondary | ICD-10-CM | POA: Diagnosis not present

## 2019-10-15 DIAGNOSIS — Z992 Dependence on renal dialysis: Secondary | ICD-10-CM | POA: Diagnosis not present

## 2019-10-15 DIAGNOSIS — N2581 Secondary hyperparathyroidism of renal origin: Secondary | ICD-10-CM | POA: Diagnosis not present

## 2019-10-15 DIAGNOSIS — N186 End stage renal disease: Secondary | ICD-10-CM | POA: Diagnosis not present

## 2019-10-15 DIAGNOSIS — D509 Iron deficiency anemia, unspecified: Secondary | ICD-10-CM | POA: Diagnosis not present

## 2019-10-15 DIAGNOSIS — D631 Anemia in chronic kidney disease: Secondary | ICD-10-CM | POA: Diagnosis not present

## 2019-10-17 DIAGNOSIS — N2581 Secondary hyperparathyroidism of renal origin: Secondary | ICD-10-CM | POA: Diagnosis not present

## 2019-10-17 DIAGNOSIS — N186 End stage renal disease: Secondary | ICD-10-CM | POA: Diagnosis not present

## 2019-10-17 DIAGNOSIS — D509 Iron deficiency anemia, unspecified: Secondary | ICD-10-CM | POA: Diagnosis not present

## 2019-10-17 DIAGNOSIS — Z992 Dependence on renal dialysis: Secondary | ICD-10-CM | POA: Diagnosis not present

## 2019-10-17 DIAGNOSIS — D631 Anemia in chronic kidney disease: Secondary | ICD-10-CM | POA: Diagnosis not present

## 2019-10-19 DIAGNOSIS — Z992 Dependence on renal dialysis: Secondary | ICD-10-CM | POA: Diagnosis not present

## 2019-10-19 DIAGNOSIS — N2581 Secondary hyperparathyroidism of renal origin: Secondary | ICD-10-CM | POA: Diagnosis not present

## 2019-10-19 DIAGNOSIS — N186 End stage renal disease: Secondary | ICD-10-CM | POA: Diagnosis not present

## 2019-10-19 DIAGNOSIS — D509 Iron deficiency anemia, unspecified: Secondary | ICD-10-CM | POA: Diagnosis not present

## 2019-10-19 DIAGNOSIS — D631 Anemia in chronic kidney disease: Secondary | ICD-10-CM | POA: Diagnosis not present

## 2019-10-22 DIAGNOSIS — D509 Iron deficiency anemia, unspecified: Secondary | ICD-10-CM | POA: Diagnosis not present

## 2019-10-22 DIAGNOSIS — D631 Anemia in chronic kidney disease: Secondary | ICD-10-CM | POA: Diagnosis not present

## 2019-10-22 DIAGNOSIS — N186 End stage renal disease: Secondary | ICD-10-CM | POA: Diagnosis not present

## 2019-10-22 DIAGNOSIS — N2581 Secondary hyperparathyroidism of renal origin: Secondary | ICD-10-CM | POA: Diagnosis not present

## 2019-10-22 DIAGNOSIS — Z992 Dependence on renal dialysis: Secondary | ICD-10-CM | POA: Diagnosis not present

## 2019-10-25 DIAGNOSIS — N186 End stage renal disease: Secondary | ICD-10-CM | POA: Diagnosis not present

## 2019-10-25 DIAGNOSIS — D631 Anemia in chronic kidney disease: Secondary | ICD-10-CM | POA: Diagnosis not present

## 2019-10-25 DIAGNOSIS — D509 Iron deficiency anemia, unspecified: Secondary | ICD-10-CM | POA: Diagnosis not present

## 2019-10-25 DIAGNOSIS — N2581 Secondary hyperparathyroidism of renal origin: Secondary | ICD-10-CM | POA: Diagnosis not present

## 2019-10-25 DIAGNOSIS — Z23 Encounter for immunization: Secondary | ICD-10-CM | POA: Diagnosis not present

## 2019-10-25 DIAGNOSIS — Z992 Dependence on renal dialysis: Secondary | ICD-10-CM | POA: Diagnosis not present

## 2019-10-26 DIAGNOSIS — H25813 Combined forms of age-related cataract, bilateral: Secondary | ICD-10-CM | POA: Diagnosis not present

## 2019-10-26 DIAGNOSIS — H18513 Endothelial corneal dystrophy, bilateral: Secondary | ICD-10-CM | POA: Diagnosis not present

## 2019-10-26 DIAGNOSIS — H40013 Open angle with borderline findings, low risk, bilateral: Secondary | ICD-10-CM | POA: Diagnosis not present

## 2019-10-26 DIAGNOSIS — E119 Type 2 diabetes mellitus without complications: Secondary | ICD-10-CM | POA: Diagnosis not present

## 2019-10-27 DIAGNOSIS — Z23 Encounter for immunization: Secondary | ICD-10-CM | POA: Diagnosis not present

## 2019-10-27 DIAGNOSIS — Z992 Dependence on renal dialysis: Secondary | ICD-10-CM | POA: Diagnosis not present

## 2019-10-27 DIAGNOSIS — N2581 Secondary hyperparathyroidism of renal origin: Secondary | ICD-10-CM | POA: Diagnosis not present

## 2019-10-27 DIAGNOSIS — D509 Iron deficiency anemia, unspecified: Secondary | ICD-10-CM | POA: Diagnosis not present

## 2019-10-27 DIAGNOSIS — N186 End stage renal disease: Secondary | ICD-10-CM | POA: Diagnosis not present

## 2019-10-27 DIAGNOSIS — D631 Anemia in chronic kidney disease: Secondary | ICD-10-CM | POA: Diagnosis not present

## 2019-10-29 DIAGNOSIS — D631 Anemia in chronic kidney disease: Secondary | ICD-10-CM | POA: Diagnosis not present

## 2019-10-29 DIAGNOSIS — N186 End stage renal disease: Secondary | ICD-10-CM | POA: Diagnosis not present

## 2019-10-29 DIAGNOSIS — Z23 Encounter for immunization: Secondary | ICD-10-CM | POA: Diagnosis not present

## 2019-10-29 DIAGNOSIS — D509 Iron deficiency anemia, unspecified: Secondary | ICD-10-CM | POA: Diagnosis not present

## 2019-10-29 DIAGNOSIS — N2581 Secondary hyperparathyroidism of renal origin: Secondary | ICD-10-CM | POA: Diagnosis not present

## 2019-10-29 DIAGNOSIS — Z992 Dependence on renal dialysis: Secondary | ICD-10-CM | POA: Diagnosis not present

## 2019-11-01 DIAGNOSIS — D509 Iron deficiency anemia, unspecified: Secondary | ICD-10-CM | POA: Diagnosis not present

## 2019-11-01 DIAGNOSIS — Z992 Dependence on renal dialysis: Secondary | ICD-10-CM | POA: Diagnosis not present

## 2019-11-01 DIAGNOSIS — D631 Anemia in chronic kidney disease: Secondary | ICD-10-CM | POA: Diagnosis not present

## 2019-11-01 DIAGNOSIS — N186 End stage renal disease: Secondary | ICD-10-CM | POA: Diagnosis not present

## 2019-11-01 DIAGNOSIS — N2581 Secondary hyperparathyroidism of renal origin: Secondary | ICD-10-CM | POA: Diagnosis not present

## 2019-11-01 DIAGNOSIS — Z23 Encounter for immunization: Secondary | ICD-10-CM | POA: Diagnosis not present

## 2019-11-03 DIAGNOSIS — N186 End stage renal disease: Secondary | ICD-10-CM | POA: Diagnosis not present

## 2019-11-03 DIAGNOSIS — Z992 Dependence on renal dialysis: Secondary | ICD-10-CM | POA: Diagnosis not present

## 2019-11-03 DIAGNOSIS — D509 Iron deficiency anemia, unspecified: Secondary | ICD-10-CM | POA: Diagnosis not present

## 2019-11-03 DIAGNOSIS — Z23 Encounter for immunization: Secondary | ICD-10-CM | POA: Diagnosis not present

## 2019-11-03 DIAGNOSIS — D631 Anemia in chronic kidney disease: Secondary | ICD-10-CM | POA: Diagnosis not present

## 2019-11-03 DIAGNOSIS — N2581 Secondary hyperparathyroidism of renal origin: Secondary | ICD-10-CM | POA: Diagnosis not present

## 2019-11-04 DIAGNOSIS — I10 Essential (primary) hypertension: Secondary | ICD-10-CM | POA: Diagnosis not present

## 2019-11-04 DIAGNOSIS — Z01818 Encounter for other preprocedural examination: Secondary | ICD-10-CM | POA: Diagnosis not present

## 2019-11-04 DIAGNOSIS — I472 Ventricular tachycardia: Secondary | ICD-10-CM | POA: Diagnosis not present

## 2019-11-04 DIAGNOSIS — R2232 Localized swelling, mass and lump, left upper limb: Secondary | ICD-10-CM | POA: Diagnosis not present

## 2019-11-04 DIAGNOSIS — Z992 Dependence on renal dialysis: Secondary | ICD-10-CM | POA: Diagnosis not present

## 2019-11-04 DIAGNOSIS — F4321 Adjustment disorder with depressed mood: Secondary | ICD-10-CM | POA: Diagnosis not present

## 2019-11-04 DIAGNOSIS — D849 Immunodeficiency, unspecified: Secondary | ICD-10-CM | POA: Diagnosis not present

## 2019-11-04 DIAGNOSIS — D509 Iron deficiency anemia, unspecified: Secondary | ICD-10-CM | POA: Diagnosis not present

## 2019-11-04 DIAGNOSIS — I50812 Chronic right heart failure: Secondary | ICD-10-CM | POA: Diagnosis not present

## 2019-11-04 DIAGNOSIS — E039 Hypothyroidism, unspecified: Secondary | ICD-10-CM | POA: Diagnosis not present

## 2019-11-04 DIAGNOSIS — Z8679 Personal history of other diseases of the circulatory system: Secondary | ICD-10-CM | POA: Diagnosis not present

## 2019-11-04 DIAGNOSIS — E119 Type 2 diabetes mellitus without complications: Secondary | ICD-10-CM | POA: Diagnosis not present

## 2019-11-04 DIAGNOSIS — Z941 Heart transplant status: Secondary | ICD-10-CM | POA: Diagnosis not present

## 2019-11-04 DIAGNOSIS — G4733 Obstructive sleep apnea (adult) (pediatric): Secondary | ICD-10-CM | POA: Diagnosis not present

## 2019-11-04 DIAGNOSIS — E785 Hyperlipidemia, unspecified: Secondary | ICD-10-CM | POA: Diagnosis not present

## 2019-11-04 DIAGNOSIS — M109 Gout, unspecified: Secondary | ICD-10-CM | POA: Diagnosis not present

## 2019-11-04 DIAGNOSIS — N185 Chronic kidney disease, stage 5: Secondary | ICD-10-CM | POA: Diagnosis not present

## 2019-11-05 DIAGNOSIS — D631 Anemia in chronic kidney disease: Secondary | ICD-10-CM | POA: Diagnosis not present

## 2019-11-05 DIAGNOSIS — Z992 Dependence on renal dialysis: Secondary | ICD-10-CM | POA: Diagnosis not present

## 2019-11-05 DIAGNOSIS — D509 Iron deficiency anemia, unspecified: Secondary | ICD-10-CM | POA: Diagnosis not present

## 2019-11-05 DIAGNOSIS — N2581 Secondary hyperparathyroidism of renal origin: Secondary | ICD-10-CM | POA: Diagnosis not present

## 2019-11-05 DIAGNOSIS — N186 End stage renal disease: Secondary | ICD-10-CM | POA: Diagnosis not present

## 2019-11-05 DIAGNOSIS — Z23 Encounter for immunization: Secondary | ICD-10-CM | POA: Diagnosis not present

## 2019-11-08 DIAGNOSIS — D509 Iron deficiency anemia, unspecified: Secondary | ICD-10-CM | POA: Diagnosis not present

## 2019-11-08 DIAGNOSIS — Z992 Dependence on renal dialysis: Secondary | ICD-10-CM | POA: Diagnosis not present

## 2019-11-08 DIAGNOSIS — N2581 Secondary hyperparathyroidism of renal origin: Secondary | ICD-10-CM | POA: Diagnosis not present

## 2019-11-08 DIAGNOSIS — Z23 Encounter for immunization: Secondary | ICD-10-CM | POA: Diagnosis not present

## 2019-11-08 DIAGNOSIS — D631 Anemia in chronic kidney disease: Secondary | ICD-10-CM | POA: Diagnosis not present

## 2019-11-08 DIAGNOSIS — N186 End stage renal disease: Secondary | ICD-10-CM | POA: Diagnosis not present

## 2019-11-09 DIAGNOSIS — N2581 Secondary hyperparathyroidism of renal origin: Secondary | ICD-10-CM | POA: Diagnosis not present

## 2019-11-09 DIAGNOSIS — Z992 Dependence on renal dialysis: Secondary | ICD-10-CM | POA: Diagnosis not present

## 2019-11-09 DIAGNOSIS — D631 Anemia in chronic kidney disease: Secondary | ICD-10-CM | POA: Diagnosis not present

## 2019-11-09 DIAGNOSIS — N186 End stage renal disease: Secondary | ICD-10-CM | POA: Diagnosis not present

## 2019-11-09 DIAGNOSIS — Z23 Encounter for immunization: Secondary | ICD-10-CM | POA: Diagnosis not present

## 2019-11-09 DIAGNOSIS — D509 Iron deficiency anemia, unspecified: Secondary | ICD-10-CM | POA: Diagnosis not present

## 2019-11-10 DIAGNOSIS — D509 Iron deficiency anemia, unspecified: Secondary | ICD-10-CM | POA: Diagnosis not present

## 2019-11-10 DIAGNOSIS — I4510 Unspecified right bundle-branch block: Secondary | ICD-10-CM | POA: Diagnosis not present

## 2019-11-10 DIAGNOSIS — N186 End stage renal disease: Secondary | ICD-10-CM | POA: Diagnosis not present

## 2019-11-10 DIAGNOSIS — M069 Rheumatoid arthritis, unspecified: Secondary | ICD-10-CM | POA: Diagnosis not present

## 2019-11-10 DIAGNOSIS — I509 Heart failure, unspecified: Secondary | ICD-10-CM | POA: Diagnosis not present

## 2019-11-10 DIAGNOSIS — Z9989 Dependence on other enabling machines and devices: Secondary | ICD-10-CM | POA: Diagnosis not present

## 2019-11-10 DIAGNOSIS — M1A0220 Idiopathic chronic gout, left elbow, without tophus (tophi): Secondary | ICD-10-CM | POA: Diagnosis not present

## 2019-11-10 DIAGNOSIS — Z992 Dependence on renal dialysis: Secondary | ICD-10-CM | POA: Diagnosis not present

## 2019-11-10 DIAGNOSIS — E669 Obesity, unspecified: Secondary | ICD-10-CM | POA: Diagnosis not present

## 2019-11-10 DIAGNOSIS — E1122 Type 2 diabetes mellitus with diabetic chronic kidney disease: Secondary | ICD-10-CM | POA: Diagnosis not present

## 2019-11-10 DIAGNOSIS — M1A9XX0 Chronic gout, unspecified, without tophus (tophi): Secondary | ICD-10-CM | POA: Diagnosis not present

## 2019-11-10 DIAGNOSIS — Z87891 Personal history of nicotine dependence: Secondary | ICD-10-CM | POA: Diagnosis not present

## 2019-11-10 DIAGNOSIS — K219 Gastro-esophageal reflux disease without esophagitis: Secondary | ICD-10-CM | POA: Diagnosis not present

## 2019-11-10 DIAGNOSIS — R2232 Localized swelling, mass and lump, left upper limb: Secondary | ICD-10-CM | POA: Diagnosis not present

## 2019-11-10 DIAGNOSIS — Z941 Heart transplant status: Secondary | ICD-10-CM | POA: Diagnosis not present

## 2019-11-10 DIAGNOSIS — D869 Sarcoidosis, unspecified: Secondary | ICD-10-CM | POA: Diagnosis not present

## 2019-11-10 DIAGNOSIS — G4730 Sleep apnea, unspecified: Secondary | ICD-10-CM | POA: Diagnosis not present

## 2019-11-10 DIAGNOSIS — I50810 Right heart failure, unspecified: Secondary | ICD-10-CM | POA: Diagnosis not present

## 2019-11-10 DIAGNOSIS — D631 Anemia in chronic kidney disease: Secondary | ICD-10-CM | POA: Diagnosis not present

## 2019-11-10 DIAGNOSIS — I132 Hypertensive heart and chronic kidney disease with heart failure and with stage 5 chronic kidney disease, or end stage renal disease: Secondary | ICD-10-CM | POA: Diagnosis not present

## 2019-11-10 DIAGNOSIS — Z6835 Body mass index (BMI) 35.0-35.9, adult: Secondary | ICD-10-CM | POA: Diagnosis not present

## 2019-11-10 DIAGNOSIS — E039 Hypothyroidism, unspecified: Secondary | ICD-10-CM | POA: Diagnosis not present

## 2019-11-10 DIAGNOSIS — E079 Disorder of thyroid, unspecified: Secondary | ICD-10-CM | POA: Diagnosis not present

## 2019-11-10 DIAGNOSIS — I272 Pulmonary hypertension, unspecified: Secondary | ICD-10-CM | POA: Diagnosis not present

## 2019-11-10 DIAGNOSIS — Z7982 Long term (current) use of aspirin: Secondary | ICD-10-CM | POA: Diagnosis not present

## 2019-11-12 DIAGNOSIS — N186 End stage renal disease: Secondary | ICD-10-CM | POA: Diagnosis not present

## 2019-11-12 DIAGNOSIS — Z992 Dependence on renal dialysis: Secondary | ICD-10-CM | POA: Diagnosis not present

## 2019-11-12 DIAGNOSIS — Z23 Encounter for immunization: Secondary | ICD-10-CM | POA: Diagnosis not present

## 2019-11-12 DIAGNOSIS — D509 Iron deficiency anemia, unspecified: Secondary | ICD-10-CM | POA: Diagnosis not present

## 2019-11-12 DIAGNOSIS — D631 Anemia in chronic kidney disease: Secondary | ICD-10-CM | POA: Diagnosis not present

## 2019-11-12 DIAGNOSIS — N2581 Secondary hyperparathyroidism of renal origin: Secondary | ICD-10-CM | POA: Diagnosis not present

## 2019-11-15 DIAGNOSIS — Z992 Dependence on renal dialysis: Secondary | ICD-10-CM | POA: Diagnosis not present

## 2019-11-15 DIAGNOSIS — D509 Iron deficiency anemia, unspecified: Secondary | ICD-10-CM | POA: Diagnosis not present

## 2019-11-15 DIAGNOSIS — N186 End stage renal disease: Secondary | ICD-10-CM | POA: Diagnosis not present

## 2019-11-15 DIAGNOSIS — D631 Anemia in chronic kidney disease: Secondary | ICD-10-CM | POA: Diagnosis not present

## 2019-11-15 DIAGNOSIS — Z23 Encounter for immunization: Secondary | ICD-10-CM | POA: Diagnosis not present

## 2019-11-15 DIAGNOSIS — N2581 Secondary hyperparathyroidism of renal origin: Secondary | ICD-10-CM | POA: Diagnosis not present

## 2019-11-17 DIAGNOSIS — D631 Anemia in chronic kidney disease: Secondary | ICD-10-CM | POA: Diagnosis not present

## 2019-11-17 DIAGNOSIS — D509 Iron deficiency anemia, unspecified: Secondary | ICD-10-CM | POA: Diagnosis not present

## 2019-11-17 DIAGNOSIS — N2581 Secondary hyperparathyroidism of renal origin: Secondary | ICD-10-CM | POA: Diagnosis not present

## 2019-11-17 DIAGNOSIS — Z992 Dependence on renal dialysis: Secondary | ICD-10-CM | POA: Diagnosis not present

## 2019-11-17 DIAGNOSIS — N186 End stage renal disease: Secondary | ICD-10-CM | POA: Diagnosis not present

## 2019-11-17 DIAGNOSIS — Z23 Encounter for immunization: Secondary | ICD-10-CM | POA: Diagnosis not present

## 2019-11-20 DIAGNOSIS — N186 End stage renal disease: Secondary | ICD-10-CM | POA: Diagnosis not present

## 2019-11-20 DIAGNOSIS — D631 Anemia in chronic kidney disease: Secondary | ICD-10-CM | POA: Diagnosis not present

## 2019-11-20 DIAGNOSIS — D509 Iron deficiency anemia, unspecified: Secondary | ICD-10-CM | POA: Diagnosis not present

## 2019-11-20 DIAGNOSIS — N2581 Secondary hyperparathyroidism of renal origin: Secondary | ICD-10-CM | POA: Diagnosis not present

## 2019-11-20 DIAGNOSIS — Z992 Dependence on renal dialysis: Secondary | ICD-10-CM | POA: Diagnosis not present

## 2019-11-20 DIAGNOSIS — Z23 Encounter for immunization: Secondary | ICD-10-CM | POA: Diagnosis not present

## 2019-11-22 DIAGNOSIS — D509 Iron deficiency anemia, unspecified: Secondary | ICD-10-CM | POA: Diagnosis not present

## 2019-11-22 DIAGNOSIS — D631 Anemia in chronic kidney disease: Secondary | ICD-10-CM | POA: Diagnosis not present

## 2019-11-22 DIAGNOSIS — N2581 Secondary hyperparathyroidism of renal origin: Secondary | ICD-10-CM | POA: Diagnosis not present

## 2019-11-22 DIAGNOSIS — Z23 Encounter for immunization: Secondary | ICD-10-CM | POA: Diagnosis not present

## 2019-11-22 DIAGNOSIS — N186 End stage renal disease: Secondary | ICD-10-CM | POA: Diagnosis not present

## 2019-11-22 DIAGNOSIS — Z992 Dependence on renal dialysis: Secondary | ICD-10-CM | POA: Diagnosis not present

## 2019-11-24 DIAGNOSIS — D509 Iron deficiency anemia, unspecified: Secondary | ICD-10-CM | POA: Diagnosis not present

## 2019-11-24 DIAGNOSIS — Z992 Dependence on renal dialysis: Secondary | ICD-10-CM | POA: Diagnosis not present

## 2019-11-24 DIAGNOSIS — Z23 Encounter for immunization: Secondary | ICD-10-CM | POA: Diagnosis not present

## 2019-11-24 DIAGNOSIS — N2581 Secondary hyperparathyroidism of renal origin: Secondary | ICD-10-CM | POA: Diagnosis not present

## 2019-11-24 DIAGNOSIS — D631 Anemia in chronic kidney disease: Secondary | ICD-10-CM | POA: Diagnosis not present

## 2019-11-24 DIAGNOSIS — N186 End stage renal disease: Secondary | ICD-10-CM | POA: Diagnosis not present

## 2019-11-25 DIAGNOSIS — Z992 Dependence on renal dialysis: Secondary | ICD-10-CM | POA: Diagnosis not present

## 2019-11-25 DIAGNOSIS — T8620 Unspecified complication of heart transplant: Secondary | ICD-10-CM | POA: Diagnosis not present

## 2019-11-25 DIAGNOSIS — N186 End stage renal disease: Secondary | ICD-10-CM | POA: Diagnosis not present

## 2019-11-27 DIAGNOSIS — D509 Iron deficiency anemia, unspecified: Secondary | ICD-10-CM | POA: Diagnosis not present

## 2019-11-27 DIAGNOSIS — Z23 Encounter for immunization: Secondary | ICD-10-CM | POA: Diagnosis not present

## 2019-11-27 DIAGNOSIS — N186 End stage renal disease: Secondary | ICD-10-CM | POA: Diagnosis not present

## 2019-11-27 DIAGNOSIS — Z992 Dependence on renal dialysis: Secondary | ICD-10-CM | POA: Diagnosis not present

## 2019-11-27 DIAGNOSIS — D631 Anemia in chronic kidney disease: Secondary | ICD-10-CM | POA: Diagnosis not present

## 2019-11-27 DIAGNOSIS — N2581 Secondary hyperparathyroidism of renal origin: Secondary | ICD-10-CM | POA: Diagnosis not present

## 2019-11-29 DIAGNOSIS — N2581 Secondary hyperparathyroidism of renal origin: Secondary | ICD-10-CM | POA: Diagnosis not present

## 2019-11-29 DIAGNOSIS — Z992 Dependence on renal dialysis: Secondary | ICD-10-CM | POA: Diagnosis not present

## 2019-11-29 DIAGNOSIS — D631 Anemia in chronic kidney disease: Secondary | ICD-10-CM | POA: Diagnosis not present

## 2019-11-29 DIAGNOSIS — Z23 Encounter for immunization: Secondary | ICD-10-CM | POA: Diagnosis not present

## 2019-11-29 DIAGNOSIS — D509 Iron deficiency anemia, unspecified: Secondary | ICD-10-CM | POA: Diagnosis not present

## 2019-11-29 DIAGNOSIS — N186 End stage renal disease: Secondary | ICD-10-CM | POA: Diagnosis not present

## 2019-12-01 DIAGNOSIS — D631 Anemia in chronic kidney disease: Secondary | ICD-10-CM | POA: Diagnosis not present

## 2019-12-01 DIAGNOSIS — Z992 Dependence on renal dialysis: Secondary | ICD-10-CM | POA: Diagnosis not present

## 2019-12-01 DIAGNOSIS — D509 Iron deficiency anemia, unspecified: Secondary | ICD-10-CM | POA: Diagnosis not present

## 2019-12-01 DIAGNOSIS — N2581 Secondary hyperparathyroidism of renal origin: Secondary | ICD-10-CM | POA: Diagnosis not present

## 2019-12-01 DIAGNOSIS — Z23 Encounter for immunization: Secondary | ICD-10-CM | POA: Diagnosis not present

## 2019-12-01 DIAGNOSIS — N186 End stage renal disease: Secondary | ICD-10-CM | POA: Diagnosis not present

## 2019-12-03 DIAGNOSIS — Z23 Encounter for immunization: Secondary | ICD-10-CM | POA: Diagnosis not present

## 2019-12-03 DIAGNOSIS — Z992 Dependence on renal dialysis: Secondary | ICD-10-CM | POA: Diagnosis not present

## 2019-12-03 DIAGNOSIS — N186 End stage renal disease: Secondary | ICD-10-CM | POA: Diagnosis not present

## 2019-12-03 DIAGNOSIS — D631 Anemia in chronic kidney disease: Secondary | ICD-10-CM | POA: Diagnosis not present

## 2019-12-03 DIAGNOSIS — D509 Iron deficiency anemia, unspecified: Secondary | ICD-10-CM | POA: Diagnosis not present

## 2019-12-03 DIAGNOSIS — N2581 Secondary hyperparathyroidism of renal origin: Secondary | ICD-10-CM | POA: Diagnosis not present

## 2019-12-05 ENCOUNTER — Encounter: Payer: Medicare Other | Admitting: Surgery

## 2019-12-05 ENCOUNTER — Ambulatory Visit (HOSPITAL_COMMUNITY): Payer: Medicare Other

## 2019-12-06 DIAGNOSIS — D509 Iron deficiency anemia, unspecified: Secondary | ICD-10-CM | POA: Diagnosis not present

## 2019-12-06 DIAGNOSIS — Z992 Dependence on renal dialysis: Secondary | ICD-10-CM | POA: Diagnosis not present

## 2019-12-06 DIAGNOSIS — E119 Type 2 diabetes mellitus without complications: Secondary | ICD-10-CM | POA: Diagnosis not present

## 2019-12-06 DIAGNOSIS — Z23 Encounter for immunization: Secondary | ICD-10-CM | POA: Diagnosis not present

## 2019-12-06 DIAGNOSIS — N186 End stage renal disease: Secondary | ICD-10-CM | POA: Diagnosis not present

## 2019-12-06 DIAGNOSIS — D631 Anemia in chronic kidney disease: Secondary | ICD-10-CM | POA: Diagnosis not present

## 2019-12-06 DIAGNOSIS — N2581 Secondary hyperparathyroidism of renal origin: Secondary | ICD-10-CM | POA: Diagnosis not present

## 2019-12-08 DIAGNOSIS — N186 End stage renal disease: Secondary | ICD-10-CM | POA: Diagnosis not present

## 2019-12-08 DIAGNOSIS — D631 Anemia in chronic kidney disease: Secondary | ICD-10-CM | POA: Diagnosis not present

## 2019-12-08 DIAGNOSIS — Z992 Dependence on renal dialysis: Secondary | ICD-10-CM | POA: Diagnosis not present

## 2019-12-08 DIAGNOSIS — N2581 Secondary hyperparathyroidism of renal origin: Secondary | ICD-10-CM | POA: Diagnosis not present

## 2019-12-08 DIAGNOSIS — D509 Iron deficiency anemia, unspecified: Secondary | ICD-10-CM | POA: Diagnosis not present

## 2019-12-08 DIAGNOSIS — Z23 Encounter for immunization: Secondary | ICD-10-CM | POA: Diagnosis not present

## 2019-12-10 DIAGNOSIS — D509 Iron deficiency anemia, unspecified: Secondary | ICD-10-CM | POA: Diagnosis not present

## 2019-12-10 DIAGNOSIS — Z992 Dependence on renal dialysis: Secondary | ICD-10-CM | POA: Diagnosis not present

## 2019-12-10 DIAGNOSIS — N2581 Secondary hyperparathyroidism of renal origin: Secondary | ICD-10-CM | POA: Diagnosis not present

## 2019-12-10 DIAGNOSIS — N186 End stage renal disease: Secondary | ICD-10-CM | POA: Diagnosis not present

## 2019-12-10 DIAGNOSIS — Z23 Encounter for immunization: Secondary | ICD-10-CM | POA: Diagnosis not present

## 2019-12-10 DIAGNOSIS — D631 Anemia in chronic kidney disease: Secondary | ICD-10-CM | POA: Diagnosis not present

## 2019-12-13 DIAGNOSIS — Z992 Dependence on renal dialysis: Secondary | ICD-10-CM | POA: Diagnosis not present

## 2019-12-13 DIAGNOSIS — Z23 Encounter for immunization: Secondary | ICD-10-CM | POA: Diagnosis not present

## 2019-12-13 DIAGNOSIS — N186 End stage renal disease: Secondary | ICD-10-CM | POA: Diagnosis not present

## 2019-12-13 DIAGNOSIS — D631 Anemia in chronic kidney disease: Secondary | ICD-10-CM | POA: Diagnosis not present

## 2019-12-13 DIAGNOSIS — N2581 Secondary hyperparathyroidism of renal origin: Secondary | ICD-10-CM | POA: Diagnosis not present

## 2019-12-13 DIAGNOSIS — D509 Iron deficiency anemia, unspecified: Secondary | ICD-10-CM | POA: Diagnosis not present

## 2019-12-15 DIAGNOSIS — N2581 Secondary hyperparathyroidism of renal origin: Secondary | ICD-10-CM | POA: Diagnosis not present

## 2019-12-15 DIAGNOSIS — D631 Anemia in chronic kidney disease: Secondary | ICD-10-CM | POA: Diagnosis not present

## 2019-12-15 DIAGNOSIS — N186 End stage renal disease: Secondary | ICD-10-CM | POA: Diagnosis not present

## 2019-12-15 DIAGNOSIS — D509 Iron deficiency anemia, unspecified: Secondary | ICD-10-CM | POA: Diagnosis not present

## 2019-12-15 DIAGNOSIS — Z23 Encounter for immunization: Secondary | ICD-10-CM | POA: Diagnosis not present

## 2019-12-15 DIAGNOSIS — Z992 Dependence on renal dialysis: Secondary | ICD-10-CM | POA: Diagnosis not present

## 2019-12-16 DIAGNOSIS — Z79899 Other long term (current) drug therapy: Secondary | ICD-10-CM | POA: Diagnosis not present

## 2019-12-16 DIAGNOSIS — Z992 Dependence on renal dialysis: Secondary | ICD-10-CM | POA: Diagnosis not present

## 2019-12-16 DIAGNOSIS — D849 Immunodeficiency, unspecified: Secondary | ICD-10-CM | POA: Diagnosis not present

## 2019-12-16 DIAGNOSIS — N186 End stage renal disease: Secondary | ICD-10-CM | POA: Diagnosis not present

## 2019-12-16 DIAGNOSIS — N529 Male erectile dysfunction, unspecified: Secondary | ICD-10-CM | POA: Diagnosis not present

## 2019-12-16 DIAGNOSIS — Z941 Heart transplant status: Secondary | ICD-10-CM | POA: Diagnosis not present

## 2019-12-16 DIAGNOSIS — Z125 Encounter for screening for malignant neoplasm of prostate: Secondary | ICD-10-CM | POA: Diagnosis not present

## 2019-12-16 DIAGNOSIS — I10 Essential (primary) hypertension: Secondary | ICD-10-CM | POA: Diagnosis not present

## 2019-12-16 DIAGNOSIS — Z48298 Encounter for aftercare following other organ transplant: Secondary | ICD-10-CM | POA: Diagnosis not present

## 2019-12-16 DIAGNOSIS — I1311 Hypertensive heart and chronic kidney disease without heart failure, with stage 5 chronic kidney disease, or end stage renal disease: Secondary | ICD-10-CM | POA: Diagnosis not present

## 2019-12-17 DIAGNOSIS — Z23 Encounter for immunization: Secondary | ICD-10-CM | POA: Diagnosis not present

## 2019-12-17 DIAGNOSIS — N2581 Secondary hyperparathyroidism of renal origin: Secondary | ICD-10-CM | POA: Diagnosis not present

## 2019-12-17 DIAGNOSIS — D509 Iron deficiency anemia, unspecified: Secondary | ICD-10-CM | POA: Diagnosis not present

## 2019-12-17 DIAGNOSIS — Z992 Dependence on renal dialysis: Secondary | ICD-10-CM | POA: Diagnosis not present

## 2019-12-17 DIAGNOSIS — D631 Anemia in chronic kidney disease: Secondary | ICD-10-CM | POA: Diagnosis not present

## 2019-12-17 DIAGNOSIS — N186 End stage renal disease: Secondary | ICD-10-CM | POA: Diagnosis not present

## 2019-12-20 DIAGNOSIS — N186 End stage renal disease: Secondary | ICD-10-CM | POA: Diagnosis not present

## 2019-12-20 DIAGNOSIS — D631 Anemia in chronic kidney disease: Secondary | ICD-10-CM | POA: Diagnosis not present

## 2019-12-20 DIAGNOSIS — N2581 Secondary hyperparathyroidism of renal origin: Secondary | ICD-10-CM | POA: Diagnosis not present

## 2019-12-20 DIAGNOSIS — D509 Iron deficiency anemia, unspecified: Secondary | ICD-10-CM | POA: Diagnosis not present

## 2019-12-20 DIAGNOSIS — Z992 Dependence on renal dialysis: Secondary | ICD-10-CM | POA: Diagnosis not present

## 2019-12-20 DIAGNOSIS — Z23 Encounter for immunization: Secondary | ICD-10-CM | POA: Diagnosis not present

## 2019-12-21 DIAGNOSIS — Z7189 Other specified counseling: Secondary | ICD-10-CM | POA: Diagnosis not present

## 2019-12-21 DIAGNOSIS — E039 Hypothyroidism, unspecified: Secondary | ICD-10-CM | POA: Diagnosis not present

## 2019-12-21 DIAGNOSIS — Z7982 Long term (current) use of aspirin: Secondary | ICD-10-CM | POA: Diagnosis not present

## 2019-12-21 DIAGNOSIS — Z7952 Long term (current) use of systemic steroids: Secondary | ICD-10-CM | POA: Diagnosis not present

## 2019-12-21 DIAGNOSIS — Z792 Long term (current) use of antibiotics: Secondary | ICD-10-CM | POA: Diagnosis not present

## 2019-12-21 DIAGNOSIS — M109 Gout, unspecified: Secondary | ICD-10-CM | POA: Diagnosis not present

## 2019-12-21 DIAGNOSIS — I120 Hypertensive chronic kidney disease with stage 5 chronic kidney disease or end stage renal disease: Secondary | ICD-10-CM | POA: Diagnosis not present

## 2019-12-21 DIAGNOSIS — G4733 Obstructive sleep apnea (adult) (pediatric): Secondary | ICD-10-CM | POA: Diagnosis not present

## 2019-12-21 DIAGNOSIS — Z87891 Personal history of nicotine dependence: Secondary | ICD-10-CM | POA: Diagnosis not present

## 2019-12-21 DIAGNOSIS — T8623 Heart transplant infection: Secondary | ICD-10-CM | POA: Diagnosis not present

## 2019-12-21 DIAGNOSIS — E1122 Type 2 diabetes mellitus with diabetic chronic kidney disease: Secondary | ICD-10-CM | POA: Diagnosis not present

## 2019-12-21 DIAGNOSIS — Z794 Long term (current) use of insulin: Secondary | ICD-10-CM | POA: Diagnosis not present

## 2019-12-21 DIAGNOSIS — N186 End stage renal disease: Secondary | ICD-10-CM | POA: Diagnosis not present

## 2019-12-21 DIAGNOSIS — A310 Pulmonary mycobacterial infection: Secondary | ICD-10-CM | POA: Diagnosis not present

## 2019-12-21 DIAGNOSIS — Z79899 Other long term (current) drug therapy: Secondary | ICD-10-CM | POA: Diagnosis not present

## 2019-12-21 DIAGNOSIS — R2232 Localized swelling, mass and lump, left upper limb: Secondary | ICD-10-CM | POA: Diagnosis not present

## 2019-12-21 DIAGNOSIS — D849 Immunodeficiency, unspecified: Secondary | ICD-10-CM | POA: Diagnosis not present

## 2019-12-21 DIAGNOSIS — I272 Pulmonary hypertension, unspecified: Secondary | ICD-10-CM | POA: Diagnosis not present

## 2019-12-21 DIAGNOSIS — N185 Chronic kidney disease, stage 5: Secondary | ICD-10-CM | POA: Diagnosis not present

## 2019-12-21 DIAGNOSIS — D8489 Other immunodeficiencies: Secondary | ICD-10-CM | POA: Diagnosis not present

## 2019-12-21 DIAGNOSIS — Z992 Dependence on renal dialysis: Secondary | ICD-10-CM | POA: Diagnosis not present

## 2019-12-22 DIAGNOSIS — D509 Iron deficiency anemia, unspecified: Secondary | ICD-10-CM | POA: Diagnosis not present

## 2019-12-22 DIAGNOSIS — Z23 Encounter for immunization: Secondary | ICD-10-CM | POA: Diagnosis not present

## 2019-12-22 DIAGNOSIS — Z992 Dependence on renal dialysis: Secondary | ICD-10-CM | POA: Diagnosis not present

## 2019-12-22 DIAGNOSIS — N2581 Secondary hyperparathyroidism of renal origin: Secondary | ICD-10-CM | POA: Diagnosis not present

## 2019-12-22 DIAGNOSIS — N186 End stage renal disease: Secondary | ICD-10-CM | POA: Diagnosis not present

## 2019-12-22 DIAGNOSIS — D631 Anemia in chronic kidney disease: Secondary | ICD-10-CM | POA: Diagnosis not present

## 2019-12-24 DIAGNOSIS — Z992 Dependence on renal dialysis: Secondary | ICD-10-CM | POA: Diagnosis not present

## 2019-12-24 DIAGNOSIS — N186 End stage renal disease: Secondary | ICD-10-CM | POA: Diagnosis not present

## 2019-12-24 DIAGNOSIS — D509 Iron deficiency anemia, unspecified: Secondary | ICD-10-CM | POA: Diagnosis not present

## 2019-12-24 DIAGNOSIS — D631 Anemia in chronic kidney disease: Secondary | ICD-10-CM | POA: Diagnosis not present

## 2019-12-24 DIAGNOSIS — N2581 Secondary hyperparathyroidism of renal origin: Secondary | ICD-10-CM | POA: Diagnosis not present

## 2019-12-24 DIAGNOSIS — Z23 Encounter for immunization: Secondary | ICD-10-CM | POA: Diagnosis not present

## 2019-12-26 DIAGNOSIS — T8620 Unspecified complication of heart transplant: Secondary | ICD-10-CM | POA: Diagnosis not present

## 2019-12-26 DIAGNOSIS — Z992 Dependence on renal dialysis: Secondary | ICD-10-CM | POA: Diagnosis not present

## 2019-12-26 DIAGNOSIS — N186 End stage renal disease: Secondary | ICD-10-CM | POA: Diagnosis not present

## 2019-12-27 DIAGNOSIS — N186 End stage renal disease: Secondary | ICD-10-CM | POA: Diagnosis not present

## 2019-12-27 DIAGNOSIS — Z23 Encounter for immunization: Secondary | ICD-10-CM | POA: Diagnosis not present

## 2019-12-27 DIAGNOSIS — Z992 Dependence on renal dialysis: Secondary | ICD-10-CM | POA: Diagnosis not present

## 2019-12-27 DIAGNOSIS — D631 Anemia in chronic kidney disease: Secondary | ICD-10-CM | POA: Diagnosis not present

## 2019-12-27 DIAGNOSIS — D509 Iron deficiency anemia, unspecified: Secondary | ICD-10-CM | POA: Diagnosis not present

## 2019-12-27 DIAGNOSIS — N2581 Secondary hyperparathyroidism of renal origin: Secondary | ICD-10-CM | POA: Diagnosis not present

## 2019-12-29 DIAGNOSIS — Z992 Dependence on renal dialysis: Secondary | ICD-10-CM | POA: Diagnosis not present

## 2019-12-29 DIAGNOSIS — N2581 Secondary hyperparathyroidism of renal origin: Secondary | ICD-10-CM | POA: Diagnosis not present

## 2019-12-29 DIAGNOSIS — D631 Anemia in chronic kidney disease: Secondary | ICD-10-CM | POA: Diagnosis not present

## 2019-12-29 DIAGNOSIS — D509 Iron deficiency anemia, unspecified: Secondary | ICD-10-CM | POA: Diagnosis not present

## 2019-12-29 DIAGNOSIS — Z23 Encounter for immunization: Secondary | ICD-10-CM | POA: Diagnosis not present

## 2019-12-29 DIAGNOSIS — N186 End stage renal disease: Secondary | ICD-10-CM | POA: Diagnosis not present

## 2019-12-31 DIAGNOSIS — N186 End stage renal disease: Secondary | ICD-10-CM | POA: Diagnosis not present

## 2019-12-31 DIAGNOSIS — N2581 Secondary hyperparathyroidism of renal origin: Secondary | ICD-10-CM | POA: Diagnosis not present

## 2019-12-31 DIAGNOSIS — D509 Iron deficiency anemia, unspecified: Secondary | ICD-10-CM | POA: Diagnosis not present

## 2019-12-31 DIAGNOSIS — D631 Anemia in chronic kidney disease: Secondary | ICD-10-CM | POA: Diagnosis not present

## 2019-12-31 DIAGNOSIS — Z23 Encounter for immunization: Secondary | ICD-10-CM | POA: Diagnosis not present

## 2019-12-31 DIAGNOSIS — Z992 Dependence on renal dialysis: Secondary | ICD-10-CM | POA: Diagnosis not present

## 2020-01-03 DIAGNOSIS — N186 End stage renal disease: Secondary | ICD-10-CM | POA: Diagnosis not present

## 2020-01-03 DIAGNOSIS — D631 Anemia in chronic kidney disease: Secondary | ICD-10-CM | POA: Diagnosis not present

## 2020-01-03 DIAGNOSIS — Z23 Encounter for immunization: Secondary | ICD-10-CM | POA: Diagnosis not present

## 2020-01-03 DIAGNOSIS — N2581 Secondary hyperparathyroidism of renal origin: Secondary | ICD-10-CM | POA: Diagnosis not present

## 2020-01-03 DIAGNOSIS — Z992 Dependence on renal dialysis: Secondary | ICD-10-CM | POA: Diagnosis not present

## 2020-01-03 DIAGNOSIS — D509 Iron deficiency anemia, unspecified: Secondary | ICD-10-CM | POA: Diagnosis not present

## 2020-01-05 DIAGNOSIS — Z992 Dependence on renal dialysis: Secondary | ICD-10-CM | POA: Diagnosis not present

## 2020-01-05 DIAGNOSIS — D631 Anemia in chronic kidney disease: Secondary | ICD-10-CM | POA: Diagnosis not present

## 2020-01-05 DIAGNOSIS — N2581 Secondary hyperparathyroidism of renal origin: Secondary | ICD-10-CM | POA: Diagnosis not present

## 2020-01-05 DIAGNOSIS — Z23 Encounter for immunization: Secondary | ICD-10-CM | POA: Diagnosis not present

## 2020-01-05 DIAGNOSIS — N186 End stage renal disease: Secondary | ICD-10-CM | POA: Diagnosis not present

## 2020-01-05 DIAGNOSIS — D509 Iron deficiency anemia, unspecified: Secondary | ICD-10-CM | POA: Diagnosis not present

## 2020-01-07 DIAGNOSIS — Z992 Dependence on renal dialysis: Secondary | ICD-10-CM | POA: Diagnosis not present

## 2020-01-07 DIAGNOSIS — D509 Iron deficiency anemia, unspecified: Secondary | ICD-10-CM | POA: Diagnosis not present

## 2020-01-07 DIAGNOSIS — D631 Anemia in chronic kidney disease: Secondary | ICD-10-CM | POA: Diagnosis not present

## 2020-01-07 DIAGNOSIS — N186 End stage renal disease: Secondary | ICD-10-CM | POA: Diagnosis not present

## 2020-01-07 DIAGNOSIS — N2581 Secondary hyperparathyroidism of renal origin: Secondary | ICD-10-CM | POA: Diagnosis not present

## 2020-01-07 DIAGNOSIS — Z23 Encounter for immunization: Secondary | ICD-10-CM | POA: Diagnosis not present

## 2020-01-10 DIAGNOSIS — Z23 Encounter for immunization: Secondary | ICD-10-CM | POA: Diagnosis not present

## 2020-01-10 DIAGNOSIS — D509 Iron deficiency anemia, unspecified: Secondary | ICD-10-CM | POA: Diagnosis not present

## 2020-01-10 DIAGNOSIS — D631 Anemia in chronic kidney disease: Secondary | ICD-10-CM | POA: Diagnosis not present

## 2020-01-10 DIAGNOSIS — N186 End stage renal disease: Secondary | ICD-10-CM | POA: Diagnosis not present

## 2020-01-10 DIAGNOSIS — N2581 Secondary hyperparathyroidism of renal origin: Secondary | ICD-10-CM | POA: Diagnosis not present

## 2020-01-10 DIAGNOSIS — Z992 Dependence on renal dialysis: Secondary | ICD-10-CM | POA: Diagnosis not present

## 2020-01-11 DIAGNOSIS — M5489 Other dorsalgia: Secondary | ICD-10-CM | POA: Diagnosis not present

## 2020-01-11 DIAGNOSIS — E669 Obesity, unspecified: Secondary | ICD-10-CM | POA: Diagnosis not present

## 2020-01-11 DIAGNOSIS — Z6834 Body mass index (BMI) 34.0-34.9, adult: Secondary | ICD-10-CM | POA: Diagnosis not present

## 2020-01-11 DIAGNOSIS — N183 Chronic kidney disease, stage 3 unspecified: Secondary | ICD-10-CM | POA: Diagnosis not present

## 2020-01-11 DIAGNOSIS — I509 Heart failure, unspecified: Secondary | ICD-10-CM | POA: Diagnosis not present

## 2020-01-11 DIAGNOSIS — M150 Primary generalized (osteo)arthritis: Secondary | ICD-10-CM | POA: Diagnosis not present

## 2020-01-11 DIAGNOSIS — M1A09X0 Idiopathic chronic gout, multiple sites, without tophus (tophi): Secondary | ICD-10-CM | POA: Diagnosis not present

## 2020-01-11 DIAGNOSIS — M25522 Pain in left elbow: Secondary | ICD-10-CM | POA: Diagnosis not present

## 2020-01-11 DIAGNOSIS — M25531 Pain in right wrist: Secondary | ICD-10-CM | POA: Diagnosis not present

## 2020-01-12 DIAGNOSIS — Z23 Encounter for immunization: Secondary | ICD-10-CM | POA: Diagnosis not present

## 2020-01-12 DIAGNOSIS — D631 Anemia in chronic kidney disease: Secondary | ICD-10-CM | POA: Diagnosis not present

## 2020-01-12 DIAGNOSIS — D509 Iron deficiency anemia, unspecified: Secondary | ICD-10-CM | POA: Diagnosis not present

## 2020-01-12 DIAGNOSIS — N2581 Secondary hyperparathyroidism of renal origin: Secondary | ICD-10-CM | POA: Diagnosis not present

## 2020-01-12 DIAGNOSIS — N186 End stage renal disease: Secondary | ICD-10-CM | POA: Diagnosis not present

## 2020-01-12 DIAGNOSIS — Z992 Dependence on renal dialysis: Secondary | ICD-10-CM | POA: Diagnosis not present

## 2020-01-14 DIAGNOSIS — N2581 Secondary hyperparathyroidism of renal origin: Secondary | ICD-10-CM | POA: Diagnosis not present

## 2020-01-14 DIAGNOSIS — Z992 Dependence on renal dialysis: Secondary | ICD-10-CM | POA: Diagnosis not present

## 2020-01-14 DIAGNOSIS — D631 Anemia in chronic kidney disease: Secondary | ICD-10-CM | POA: Diagnosis not present

## 2020-01-14 DIAGNOSIS — Z23 Encounter for immunization: Secondary | ICD-10-CM | POA: Diagnosis not present

## 2020-01-14 DIAGNOSIS — N186 End stage renal disease: Secondary | ICD-10-CM | POA: Diagnosis not present

## 2020-01-14 DIAGNOSIS — D509 Iron deficiency anemia, unspecified: Secondary | ICD-10-CM | POA: Diagnosis not present

## 2020-01-17 DIAGNOSIS — N2581 Secondary hyperparathyroidism of renal origin: Secondary | ICD-10-CM | POA: Diagnosis not present

## 2020-01-17 DIAGNOSIS — D631 Anemia in chronic kidney disease: Secondary | ICD-10-CM | POA: Diagnosis not present

## 2020-01-17 DIAGNOSIS — Z992 Dependence on renal dialysis: Secondary | ICD-10-CM | POA: Diagnosis not present

## 2020-01-17 DIAGNOSIS — N186 End stage renal disease: Secondary | ICD-10-CM | POA: Diagnosis not present

## 2020-01-17 DIAGNOSIS — Z23 Encounter for immunization: Secondary | ICD-10-CM | POA: Diagnosis not present

## 2020-01-17 DIAGNOSIS — D509 Iron deficiency anemia, unspecified: Secondary | ICD-10-CM | POA: Diagnosis not present

## 2020-01-19 DIAGNOSIS — Z23 Encounter for immunization: Secondary | ICD-10-CM | POA: Diagnosis not present

## 2020-01-19 DIAGNOSIS — Z992 Dependence on renal dialysis: Secondary | ICD-10-CM | POA: Diagnosis not present

## 2020-01-19 DIAGNOSIS — D631 Anemia in chronic kidney disease: Secondary | ICD-10-CM | POA: Diagnosis not present

## 2020-01-19 DIAGNOSIS — N186 End stage renal disease: Secondary | ICD-10-CM | POA: Diagnosis not present

## 2020-01-19 DIAGNOSIS — D509 Iron deficiency anemia, unspecified: Secondary | ICD-10-CM | POA: Diagnosis not present

## 2020-01-19 DIAGNOSIS — N2581 Secondary hyperparathyroidism of renal origin: Secondary | ICD-10-CM | POA: Diagnosis not present

## 2020-01-21 DIAGNOSIS — D509 Iron deficiency anemia, unspecified: Secondary | ICD-10-CM | POA: Diagnosis not present

## 2020-01-21 DIAGNOSIS — Z992 Dependence on renal dialysis: Secondary | ICD-10-CM | POA: Diagnosis not present

## 2020-01-21 DIAGNOSIS — Z23 Encounter for immunization: Secondary | ICD-10-CM | POA: Diagnosis not present

## 2020-01-21 DIAGNOSIS — D631 Anemia in chronic kidney disease: Secondary | ICD-10-CM | POA: Diagnosis not present

## 2020-01-21 DIAGNOSIS — N2581 Secondary hyperparathyroidism of renal origin: Secondary | ICD-10-CM | POA: Diagnosis not present

## 2020-01-21 DIAGNOSIS — N186 End stage renal disease: Secondary | ICD-10-CM | POA: Diagnosis not present

## 2020-01-23 DIAGNOSIS — Z992 Dependence on renal dialysis: Secondary | ICD-10-CM | POA: Diagnosis not present

## 2020-01-23 DIAGNOSIS — T8620 Unspecified complication of heart transplant: Secondary | ICD-10-CM | POA: Diagnosis not present

## 2020-01-23 DIAGNOSIS — N186 End stage renal disease: Secondary | ICD-10-CM | POA: Diagnosis not present

## 2020-01-24 DIAGNOSIS — N2581 Secondary hyperparathyroidism of renal origin: Secondary | ICD-10-CM | POA: Diagnosis not present

## 2020-01-24 DIAGNOSIS — Z23 Encounter for immunization: Secondary | ICD-10-CM | POA: Diagnosis not present

## 2020-01-24 DIAGNOSIS — Z992 Dependence on renal dialysis: Secondary | ICD-10-CM | POA: Diagnosis not present

## 2020-01-24 DIAGNOSIS — D509 Iron deficiency anemia, unspecified: Secondary | ICD-10-CM | POA: Diagnosis not present

## 2020-01-24 DIAGNOSIS — N186 End stage renal disease: Secondary | ICD-10-CM | POA: Diagnosis not present

## 2020-01-24 DIAGNOSIS — D631 Anemia in chronic kidney disease: Secondary | ICD-10-CM | POA: Diagnosis not present

## 2020-01-26 DIAGNOSIS — D509 Iron deficiency anemia, unspecified: Secondary | ICD-10-CM | POA: Diagnosis not present

## 2020-01-26 DIAGNOSIS — Z992 Dependence on renal dialysis: Secondary | ICD-10-CM | POA: Diagnosis not present

## 2020-01-26 DIAGNOSIS — Z23 Encounter for immunization: Secondary | ICD-10-CM | POA: Diagnosis not present

## 2020-01-26 DIAGNOSIS — N2581 Secondary hyperparathyroidism of renal origin: Secondary | ICD-10-CM | POA: Diagnosis not present

## 2020-01-26 DIAGNOSIS — N186 End stage renal disease: Secondary | ICD-10-CM | POA: Diagnosis not present

## 2020-01-26 DIAGNOSIS — D631 Anemia in chronic kidney disease: Secondary | ICD-10-CM | POA: Diagnosis not present

## 2020-01-28 DIAGNOSIS — N186 End stage renal disease: Secondary | ICD-10-CM | POA: Diagnosis not present

## 2020-01-28 DIAGNOSIS — D631 Anemia in chronic kidney disease: Secondary | ICD-10-CM | POA: Diagnosis not present

## 2020-01-28 DIAGNOSIS — N2581 Secondary hyperparathyroidism of renal origin: Secondary | ICD-10-CM | POA: Diagnosis not present

## 2020-01-28 DIAGNOSIS — Z992 Dependence on renal dialysis: Secondary | ICD-10-CM | POA: Diagnosis not present

## 2020-01-28 DIAGNOSIS — D509 Iron deficiency anemia, unspecified: Secondary | ICD-10-CM | POA: Diagnosis not present

## 2020-01-28 DIAGNOSIS — Z23 Encounter for immunization: Secondary | ICD-10-CM | POA: Diagnosis not present

## 2020-01-31 DIAGNOSIS — N186 End stage renal disease: Secondary | ICD-10-CM | POA: Diagnosis not present

## 2020-01-31 DIAGNOSIS — N2581 Secondary hyperparathyroidism of renal origin: Secondary | ICD-10-CM | POA: Diagnosis not present

## 2020-01-31 DIAGNOSIS — D509 Iron deficiency anemia, unspecified: Secondary | ICD-10-CM | POA: Diagnosis not present

## 2020-01-31 DIAGNOSIS — D631 Anemia in chronic kidney disease: Secondary | ICD-10-CM | POA: Diagnosis not present

## 2020-01-31 DIAGNOSIS — Z992 Dependence on renal dialysis: Secondary | ICD-10-CM | POA: Diagnosis not present

## 2020-01-31 DIAGNOSIS — Z23 Encounter for immunization: Secondary | ICD-10-CM | POA: Diagnosis not present

## 2020-02-01 DIAGNOSIS — E1122 Type 2 diabetes mellitus with diabetic chronic kidney disease: Secondary | ICD-10-CM | POA: Diagnosis not present

## 2020-02-01 DIAGNOSIS — T8623 Heart transplant infection: Secondary | ICD-10-CM | POA: Diagnosis not present

## 2020-02-01 DIAGNOSIS — Z792 Long term (current) use of antibiotics: Secondary | ICD-10-CM | POA: Diagnosis not present

## 2020-02-01 DIAGNOSIS — Z992 Dependence on renal dialysis: Secondary | ICD-10-CM | POA: Diagnosis not present

## 2020-02-01 DIAGNOSIS — N185 Chronic kidney disease, stage 5: Secondary | ICD-10-CM | POA: Diagnosis not present

## 2020-02-01 DIAGNOSIS — Z79899 Other long term (current) drug therapy: Secondary | ICD-10-CM | POA: Diagnosis not present

## 2020-02-01 DIAGNOSIS — I120 Hypertensive chronic kidney disease with stage 5 chronic kidney disease or end stage renal disease: Secondary | ICD-10-CM | POA: Diagnosis not present

## 2020-02-01 DIAGNOSIS — R5383 Other fatigue: Secondary | ICD-10-CM | POA: Diagnosis not present

## 2020-02-01 DIAGNOSIS — M109 Gout, unspecified: Secondary | ICD-10-CM | POA: Diagnosis not present

## 2020-02-01 DIAGNOSIS — R34 Anuria and oliguria: Secondary | ICD-10-CM | POA: Diagnosis not present

## 2020-02-01 DIAGNOSIS — E039 Hypothyroidism, unspecified: Secondary | ICD-10-CM | POA: Diagnosis not present

## 2020-02-01 DIAGNOSIS — R2232 Localized swelling, mass and lump, left upper limb: Secondary | ICD-10-CM | POA: Diagnosis not present

## 2020-02-01 DIAGNOSIS — D849 Immunodeficiency, unspecified: Secondary | ICD-10-CM | POA: Diagnosis not present

## 2020-02-01 DIAGNOSIS — A310 Pulmonary mycobacterial infection: Secondary | ICD-10-CM | POA: Diagnosis not present

## 2020-02-01 DIAGNOSIS — Z87891 Personal history of nicotine dependence: Secondary | ICD-10-CM | POA: Diagnosis not present

## 2020-02-02 DIAGNOSIS — D631 Anemia in chronic kidney disease: Secondary | ICD-10-CM | POA: Diagnosis not present

## 2020-02-02 DIAGNOSIS — N186 End stage renal disease: Secondary | ICD-10-CM | POA: Diagnosis not present

## 2020-02-02 DIAGNOSIS — N2581 Secondary hyperparathyroidism of renal origin: Secondary | ICD-10-CM | POA: Diagnosis not present

## 2020-02-02 DIAGNOSIS — Z23 Encounter for immunization: Secondary | ICD-10-CM | POA: Diagnosis not present

## 2020-02-02 DIAGNOSIS — D509 Iron deficiency anemia, unspecified: Secondary | ICD-10-CM | POA: Diagnosis not present

## 2020-02-02 DIAGNOSIS — Z992 Dependence on renal dialysis: Secondary | ICD-10-CM | POA: Diagnosis not present

## 2020-02-04 DIAGNOSIS — N186 End stage renal disease: Secondary | ICD-10-CM | POA: Diagnosis not present

## 2020-02-04 DIAGNOSIS — D631 Anemia in chronic kidney disease: Secondary | ICD-10-CM | POA: Diagnosis not present

## 2020-02-04 DIAGNOSIS — D509 Iron deficiency anemia, unspecified: Secondary | ICD-10-CM | POA: Diagnosis not present

## 2020-02-04 DIAGNOSIS — N2581 Secondary hyperparathyroidism of renal origin: Secondary | ICD-10-CM | POA: Diagnosis not present

## 2020-02-04 DIAGNOSIS — Z992 Dependence on renal dialysis: Secondary | ICD-10-CM | POA: Diagnosis not present

## 2020-02-04 DIAGNOSIS — Z23 Encounter for immunization: Secondary | ICD-10-CM | POA: Diagnosis not present

## 2020-02-06 DIAGNOSIS — I120 Hypertensive chronic kidney disease with stage 5 chronic kidney disease or end stage renal disease: Secondary | ICD-10-CM | POA: Diagnosis not present

## 2020-02-06 DIAGNOSIS — N186 End stage renal disease: Secondary | ICD-10-CM | POA: Diagnosis not present

## 2020-02-06 DIAGNOSIS — Z992 Dependence on renal dialysis: Secondary | ICD-10-CM | POA: Diagnosis not present

## 2020-02-06 DIAGNOSIS — I10 Essential (primary) hypertension: Secondary | ICD-10-CM | POA: Diagnosis not present

## 2020-02-06 DIAGNOSIS — E119 Type 2 diabetes mellitus without complications: Secondary | ICD-10-CM | POA: Diagnosis not present

## 2020-02-06 DIAGNOSIS — H903 Sensorineural hearing loss, bilateral: Secondary | ICD-10-CM | POA: Diagnosis not present

## 2020-02-06 DIAGNOSIS — Z941 Heart transplant status: Secondary | ICD-10-CM | POA: Diagnosis not present

## 2020-02-06 DIAGNOSIS — Z87891 Personal history of nicotine dependence: Secondary | ICD-10-CM | POA: Diagnosis not present

## 2020-02-07 DIAGNOSIS — D509 Iron deficiency anemia, unspecified: Secondary | ICD-10-CM | POA: Diagnosis not present

## 2020-02-07 DIAGNOSIS — D631 Anemia in chronic kidney disease: Secondary | ICD-10-CM | POA: Diagnosis not present

## 2020-02-07 DIAGNOSIS — Z23 Encounter for immunization: Secondary | ICD-10-CM | POA: Diagnosis not present

## 2020-02-07 DIAGNOSIS — N2581 Secondary hyperparathyroidism of renal origin: Secondary | ICD-10-CM | POA: Diagnosis not present

## 2020-02-07 DIAGNOSIS — Z992 Dependence on renal dialysis: Secondary | ICD-10-CM | POA: Diagnosis not present

## 2020-02-07 DIAGNOSIS — N186 End stage renal disease: Secondary | ICD-10-CM | POA: Diagnosis not present

## 2020-02-09 DIAGNOSIS — Z23 Encounter for immunization: Secondary | ICD-10-CM | POA: Diagnosis not present

## 2020-02-09 DIAGNOSIS — D631 Anemia in chronic kidney disease: Secondary | ICD-10-CM | POA: Diagnosis not present

## 2020-02-09 DIAGNOSIS — N2581 Secondary hyperparathyroidism of renal origin: Secondary | ICD-10-CM | POA: Diagnosis not present

## 2020-02-09 DIAGNOSIS — N186 End stage renal disease: Secondary | ICD-10-CM | POA: Diagnosis not present

## 2020-02-09 DIAGNOSIS — Z992 Dependence on renal dialysis: Secondary | ICD-10-CM | POA: Diagnosis not present

## 2020-02-09 DIAGNOSIS — D509 Iron deficiency anemia, unspecified: Secondary | ICD-10-CM | POA: Diagnosis not present

## 2020-02-11 DIAGNOSIS — Z992 Dependence on renal dialysis: Secondary | ICD-10-CM | POA: Diagnosis not present

## 2020-02-11 DIAGNOSIS — D631 Anemia in chronic kidney disease: Secondary | ICD-10-CM | POA: Diagnosis not present

## 2020-02-11 DIAGNOSIS — N2581 Secondary hyperparathyroidism of renal origin: Secondary | ICD-10-CM | POA: Diagnosis not present

## 2020-02-11 DIAGNOSIS — Z23 Encounter for immunization: Secondary | ICD-10-CM | POA: Diagnosis not present

## 2020-02-11 DIAGNOSIS — N186 End stage renal disease: Secondary | ICD-10-CM | POA: Diagnosis not present

## 2020-02-11 DIAGNOSIS — D509 Iron deficiency anemia, unspecified: Secondary | ICD-10-CM | POA: Diagnosis not present

## 2020-02-14 DIAGNOSIS — N2581 Secondary hyperparathyroidism of renal origin: Secondary | ICD-10-CM | POA: Diagnosis not present

## 2020-02-14 DIAGNOSIS — D509 Iron deficiency anemia, unspecified: Secondary | ICD-10-CM | POA: Diagnosis not present

## 2020-02-14 DIAGNOSIS — N186 End stage renal disease: Secondary | ICD-10-CM | POA: Diagnosis not present

## 2020-02-14 DIAGNOSIS — D631 Anemia in chronic kidney disease: Secondary | ICD-10-CM | POA: Diagnosis not present

## 2020-02-14 DIAGNOSIS — Z23 Encounter for immunization: Secondary | ICD-10-CM | POA: Diagnosis not present

## 2020-02-14 DIAGNOSIS — Z992 Dependence on renal dialysis: Secondary | ICD-10-CM | POA: Diagnosis not present

## 2020-02-16 DIAGNOSIS — D509 Iron deficiency anemia, unspecified: Secondary | ICD-10-CM | POA: Diagnosis not present

## 2020-02-16 DIAGNOSIS — Z23 Encounter for immunization: Secondary | ICD-10-CM | POA: Diagnosis not present

## 2020-02-16 DIAGNOSIS — N186 End stage renal disease: Secondary | ICD-10-CM | POA: Diagnosis not present

## 2020-02-16 DIAGNOSIS — N2581 Secondary hyperparathyroidism of renal origin: Secondary | ICD-10-CM | POA: Diagnosis not present

## 2020-02-16 DIAGNOSIS — Z992 Dependence on renal dialysis: Secondary | ICD-10-CM | POA: Diagnosis not present

## 2020-02-16 DIAGNOSIS — D631 Anemia in chronic kidney disease: Secondary | ICD-10-CM | POA: Diagnosis not present

## 2020-02-18 DIAGNOSIS — N2581 Secondary hyperparathyroidism of renal origin: Secondary | ICD-10-CM | POA: Diagnosis not present

## 2020-02-18 DIAGNOSIS — D509 Iron deficiency anemia, unspecified: Secondary | ICD-10-CM | POA: Diagnosis not present

## 2020-02-18 DIAGNOSIS — D631 Anemia in chronic kidney disease: Secondary | ICD-10-CM | POA: Diagnosis not present

## 2020-02-18 DIAGNOSIS — Z992 Dependence on renal dialysis: Secondary | ICD-10-CM | POA: Diagnosis not present

## 2020-02-18 DIAGNOSIS — N186 End stage renal disease: Secondary | ICD-10-CM | POA: Diagnosis not present

## 2020-02-18 DIAGNOSIS — Z23 Encounter for immunization: Secondary | ICD-10-CM | POA: Diagnosis not present

## 2020-02-21 DIAGNOSIS — N2581 Secondary hyperparathyroidism of renal origin: Secondary | ICD-10-CM | POA: Diagnosis not present

## 2020-02-21 DIAGNOSIS — D509 Iron deficiency anemia, unspecified: Secondary | ICD-10-CM | POA: Diagnosis not present

## 2020-02-21 DIAGNOSIS — N186 End stage renal disease: Secondary | ICD-10-CM | POA: Diagnosis not present

## 2020-02-21 DIAGNOSIS — Z23 Encounter for immunization: Secondary | ICD-10-CM | POA: Diagnosis not present

## 2020-02-21 DIAGNOSIS — D631 Anemia in chronic kidney disease: Secondary | ICD-10-CM | POA: Diagnosis not present

## 2020-02-21 DIAGNOSIS — Z992 Dependence on renal dialysis: Secondary | ICD-10-CM | POA: Diagnosis not present

## 2020-02-23 DIAGNOSIS — E785 Hyperlipidemia, unspecified: Secondary | ICD-10-CM | POA: Diagnosis not present

## 2020-02-23 DIAGNOSIS — E039 Hypothyroidism, unspecified: Secondary | ICD-10-CM | POA: Diagnosis not present

## 2020-02-23 DIAGNOSIS — I50812 Chronic right heart failure: Secondary | ICD-10-CM | POA: Diagnosis not present

## 2020-02-23 DIAGNOSIS — E119 Type 2 diabetes mellitus without complications: Secondary | ICD-10-CM | POA: Diagnosis not present

## 2020-02-23 DIAGNOSIS — G4733 Obstructive sleep apnea (adult) (pediatric): Secondary | ICD-10-CM | POA: Diagnosis not present

## 2020-02-23 DIAGNOSIS — M109 Gout, unspecified: Secondary | ICD-10-CM | POA: Diagnosis not present

## 2020-02-23 DIAGNOSIS — Z23 Encounter for immunization: Secondary | ICD-10-CM | POA: Diagnosis not present

## 2020-02-23 DIAGNOSIS — Z8679 Personal history of other diseases of the circulatory system: Secondary | ICD-10-CM | POA: Diagnosis not present

## 2020-02-23 DIAGNOSIS — T8620 Unspecified complication of heart transplant: Secondary | ICD-10-CM | POA: Diagnosis not present

## 2020-02-23 DIAGNOSIS — D631 Anemia in chronic kidney disease: Secondary | ICD-10-CM | POA: Diagnosis not present

## 2020-02-23 DIAGNOSIS — D509 Iron deficiency anemia, unspecified: Secondary | ICD-10-CM | POA: Diagnosis not present

## 2020-02-23 DIAGNOSIS — Z01818 Encounter for other preprocedural examination: Secondary | ICD-10-CM | POA: Diagnosis not present

## 2020-02-23 DIAGNOSIS — F4321 Adjustment disorder with depressed mood: Secondary | ICD-10-CM | POA: Diagnosis not present

## 2020-02-23 DIAGNOSIS — Z941 Heart transplant status: Secondary | ICD-10-CM | POA: Diagnosis not present

## 2020-02-23 DIAGNOSIS — I132 Hypertensive heart and chronic kidney disease with heart failure and with stage 5 chronic kidney disease, or end stage renal disease: Secondary | ICD-10-CM | POA: Diagnosis not present

## 2020-02-23 DIAGNOSIS — N186 End stage renal disease: Secondary | ICD-10-CM | POA: Diagnosis not present

## 2020-02-23 DIAGNOSIS — E1122 Type 2 diabetes mellitus with diabetic chronic kidney disease: Secondary | ICD-10-CM | POA: Diagnosis not present

## 2020-02-23 DIAGNOSIS — Z992 Dependence on renal dialysis: Secondary | ICD-10-CM | POA: Diagnosis not present

## 2020-02-23 DIAGNOSIS — D849 Immunodeficiency, unspecified: Secondary | ICD-10-CM | POA: Diagnosis not present

## 2020-02-23 DIAGNOSIS — N2581 Secondary hyperparathyroidism of renal origin: Secondary | ICD-10-CM | POA: Diagnosis not present

## 2020-02-23 DIAGNOSIS — I120 Hypertensive chronic kidney disease with stage 5 chronic kidney disease or end stage renal disease: Secondary | ICD-10-CM | POA: Diagnosis not present

## 2020-02-24 DIAGNOSIS — Z48298 Encounter for aftercare following other organ transplant: Secondary | ICD-10-CM | POA: Diagnosis not present

## 2020-02-24 DIAGNOSIS — I50812 Chronic right heart failure: Secondary | ICD-10-CM | POA: Diagnosis not present

## 2020-02-24 DIAGNOSIS — D509 Iron deficiency anemia, unspecified: Secondary | ICD-10-CM | POA: Diagnosis not present

## 2020-02-24 DIAGNOSIS — N186 End stage renal disease: Secondary | ICD-10-CM | POA: Diagnosis not present

## 2020-02-24 DIAGNOSIS — E1122 Type 2 diabetes mellitus with diabetic chronic kidney disease: Secondary | ICD-10-CM | POA: Diagnosis not present

## 2020-02-24 DIAGNOSIS — M109 Gout, unspecified: Secondary | ICD-10-CM | POA: Diagnosis not present

## 2020-02-24 DIAGNOSIS — G4733 Obstructive sleep apnea (adult) (pediatric): Secondary | ICD-10-CM | POA: Diagnosis not present

## 2020-02-24 DIAGNOSIS — I120 Hypertensive chronic kidney disease with stage 5 chronic kidney disease or end stage renal disease: Secondary | ICD-10-CM | POA: Diagnosis not present

## 2020-02-24 DIAGNOSIS — Z01818 Encounter for other preprocedural examination: Secondary | ICD-10-CM | POA: Diagnosis not present

## 2020-02-24 DIAGNOSIS — E785 Hyperlipidemia, unspecified: Secondary | ICD-10-CM | POA: Diagnosis not present

## 2020-02-24 DIAGNOSIS — Z8679 Personal history of other diseases of the circulatory system: Secondary | ICD-10-CM | POA: Diagnosis not present

## 2020-02-24 DIAGNOSIS — Z7952 Long term (current) use of systemic steroids: Secondary | ICD-10-CM | POA: Diagnosis not present

## 2020-02-24 DIAGNOSIS — Z79899 Other long term (current) drug therapy: Secondary | ICD-10-CM | POA: Diagnosis not present

## 2020-02-24 DIAGNOSIS — D696 Thrombocytopenia, unspecified: Secondary | ICD-10-CM | POA: Diagnosis not present

## 2020-02-24 DIAGNOSIS — I132 Hypertensive heart and chronic kidney disease with heart failure and with stage 5 chronic kidney disease, or end stage renal disease: Secondary | ICD-10-CM | POA: Diagnosis not present

## 2020-02-24 DIAGNOSIS — E119 Type 2 diabetes mellitus without complications: Secondary | ICD-10-CM | POA: Diagnosis not present

## 2020-02-24 DIAGNOSIS — F4321 Adjustment disorder with depressed mood: Secondary | ICD-10-CM | POA: Diagnosis not present

## 2020-02-24 DIAGNOSIS — Z992 Dependence on renal dialysis: Secondary | ICD-10-CM | POA: Diagnosis not present

## 2020-02-24 DIAGNOSIS — D849 Immunodeficiency, unspecified: Secondary | ICD-10-CM | POA: Diagnosis not present

## 2020-02-24 DIAGNOSIS — E039 Hypothyroidism, unspecified: Secondary | ICD-10-CM | POA: Diagnosis not present

## 2020-02-24 DIAGNOSIS — Z941 Heart transplant status: Secondary | ICD-10-CM | POA: Diagnosis not present

## 2020-02-24 DIAGNOSIS — E876 Hypokalemia: Secondary | ICD-10-CM | POA: Diagnosis not present

## 2020-02-25 DIAGNOSIS — Z23 Encounter for immunization: Secondary | ICD-10-CM | POA: Diagnosis not present

## 2020-02-25 DIAGNOSIS — Z992 Dependence on renal dialysis: Secondary | ICD-10-CM | POA: Diagnosis not present

## 2020-02-25 DIAGNOSIS — D631 Anemia in chronic kidney disease: Secondary | ICD-10-CM | POA: Diagnosis not present

## 2020-02-25 DIAGNOSIS — N186 End stage renal disease: Secondary | ICD-10-CM | POA: Diagnosis not present

## 2020-02-25 DIAGNOSIS — N2581 Secondary hyperparathyroidism of renal origin: Secondary | ICD-10-CM | POA: Diagnosis not present

## 2020-02-25 DIAGNOSIS — D509 Iron deficiency anemia, unspecified: Secondary | ICD-10-CM | POA: Diagnosis not present

## 2020-02-28 DIAGNOSIS — Z992 Dependence on renal dialysis: Secondary | ICD-10-CM | POA: Diagnosis not present

## 2020-02-28 DIAGNOSIS — N186 End stage renal disease: Secondary | ICD-10-CM | POA: Diagnosis not present

## 2020-02-28 DIAGNOSIS — D631 Anemia in chronic kidney disease: Secondary | ICD-10-CM | POA: Diagnosis not present

## 2020-02-28 DIAGNOSIS — Z23 Encounter for immunization: Secondary | ICD-10-CM | POA: Diagnosis not present

## 2020-02-28 DIAGNOSIS — D509 Iron deficiency anemia, unspecified: Secondary | ICD-10-CM | POA: Diagnosis not present

## 2020-02-28 DIAGNOSIS — N2581 Secondary hyperparathyroidism of renal origin: Secondary | ICD-10-CM | POA: Diagnosis not present

## 2020-02-29 DIAGNOSIS — Z20822 Contact with and (suspected) exposure to covid-19: Secondary | ICD-10-CM | POA: Diagnosis not present

## 2020-02-29 DIAGNOSIS — Z1159 Encounter for screening for other viral diseases: Secondary | ICD-10-CM | POA: Diagnosis not present

## 2020-03-01 DIAGNOSIS — E119 Type 2 diabetes mellitus without complications: Secondary | ICD-10-CM | POA: Diagnosis not present

## 2020-03-01 DIAGNOSIS — N186 End stage renal disease: Secondary | ICD-10-CM | POA: Diagnosis not present

## 2020-03-01 DIAGNOSIS — D631 Anemia in chronic kidney disease: Secondary | ICD-10-CM | POA: Diagnosis not present

## 2020-03-01 DIAGNOSIS — D509 Iron deficiency anemia, unspecified: Secondary | ICD-10-CM | POA: Diagnosis not present

## 2020-03-01 DIAGNOSIS — Z23 Encounter for immunization: Secondary | ICD-10-CM | POA: Diagnosis not present

## 2020-03-01 DIAGNOSIS — N2581 Secondary hyperparathyroidism of renal origin: Secondary | ICD-10-CM | POA: Diagnosis not present

## 2020-03-01 DIAGNOSIS — Z992 Dependence on renal dialysis: Secondary | ICD-10-CM | POA: Diagnosis not present

## 2020-03-02 DIAGNOSIS — M79622 Pain in left upper arm: Secondary | ICD-10-CM | POA: Diagnosis not present

## 2020-03-02 DIAGNOSIS — I50810 Right heart failure, unspecified: Secondary | ICD-10-CM | POA: Diagnosis not present

## 2020-03-02 DIAGNOSIS — Z6832 Body mass index (BMI) 32.0-32.9, adult: Secondary | ICD-10-CM | POA: Diagnosis not present

## 2020-03-02 DIAGNOSIS — G4730 Sleep apnea, unspecified: Secondary | ICD-10-CM | POA: Diagnosis not present

## 2020-03-02 DIAGNOSIS — M1990 Unspecified osteoarthritis, unspecified site: Secondary | ICD-10-CM | POA: Diagnosis not present

## 2020-03-02 DIAGNOSIS — Z87891 Personal history of nicotine dependence: Secondary | ICD-10-CM | POA: Diagnosis not present

## 2020-03-02 DIAGNOSIS — K219 Gastro-esophageal reflux disease without esophagitis: Secondary | ICD-10-CM | POA: Diagnosis not present

## 2020-03-02 DIAGNOSIS — M069 Rheumatoid arthritis, unspecified: Secondary | ICD-10-CM | POA: Diagnosis not present

## 2020-03-02 DIAGNOSIS — E1122 Type 2 diabetes mellitus with diabetic chronic kidney disease: Secondary | ICD-10-CM | POA: Diagnosis not present

## 2020-03-02 DIAGNOSIS — I132 Hypertensive heart and chronic kidney disease with heart failure and with stage 5 chronic kidney disease, or end stage renal disease: Secondary | ICD-10-CM | POA: Diagnosis not present

## 2020-03-02 DIAGNOSIS — D631 Anemia in chronic kidney disease: Secondary | ICD-10-CM | POA: Diagnosis not present

## 2020-03-02 DIAGNOSIS — E079 Disorder of thyroid, unspecified: Secondary | ICD-10-CM | POA: Diagnosis not present

## 2020-03-02 DIAGNOSIS — N186 End stage renal disease: Secondary | ICD-10-CM | POA: Diagnosis not present

## 2020-03-02 DIAGNOSIS — D509 Iron deficiency anemia, unspecified: Secondary | ICD-10-CM | POA: Diagnosis not present

## 2020-03-02 DIAGNOSIS — Z992 Dependence on renal dialysis: Secondary | ICD-10-CM | POA: Diagnosis not present

## 2020-03-02 DIAGNOSIS — G8918 Other acute postprocedural pain: Secondary | ICD-10-CM | POA: Diagnosis not present

## 2020-03-02 DIAGNOSIS — M109 Gout, unspecified: Secondary | ICD-10-CM | POA: Diagnosis not present

## 2020-03-02 DIAGNOSIS — I4510 Unspecified right bundle-branch block: Secondary | ICD-10-CM | POA: Diagnosis not present

## 2020-03-02 DIAGNOSIS — Z79899 Other long term (current) drug therapy: Secondary | ICD-10-CM | POA: Diagnosis not present

## 2020-03-02 DIAGNOSIS — E669 Obesity, unspecified: Secondary | ICD-10-CM | POA: Diagnosis not present

## 2020-03-06 DIAGNOSIS — N2581 Secondary hyperparathyroidism of renal origin: Secondary | ICD-10-CM | POA: Diagnosis not present

## 2020-03-06 DIAGNOSIS — D509 Iron deficiency anemia, unspecified: Secondary | ICD-10-CM | POA: Diagnosis not present

## 2020-03-06 DIAGNOSIS — N186 End stage renal disease: Secondary | ICD-10-CM | POA: Diagnosis not present

## 2020-03-06 DIAGNOSIS — Z23 Encounter for immunization: Secondary | ICD-10-CM | POA: Diagnosis not present

## 2020-03-06 DIAGNOSIS — Z992 Dependence on renal dialysis: Secondary | ICD-10-CM | POA: Diagnosis not present

## 2020-03-06 DIAGNOSIS — D631 Anemia in chronic kidney disease: Secondary | ICD-10-CM | POA: Diagnosis not present

## 2020-03-08 DIAGNOSIS — N186 End stage renal disease: Secondary | ICD-10-CM | POA: Diagnosis not present

## 2020-03-08 DIAGNOSIS — N2581 Secondary hyperparathyroidism of renal origin: Secondary | ICD-10-CM | POA: Diagnosis not present

## 2020-03-08 DIAGNOSIS — D509 Iron deficiency anemia, unspecified: Secondary | ICD-10-CM | POA: Diagnosis not present

## 2020-03-08 DIAGNOSIS — Z23 Encounter for immunization: Secondary | ICD-10-CM | POA: Diagnosis not present

## 2020-03-08 DIAGNOSIS — D631 Anemia in chronic kidney disease: Secondary | ICD-10-CM | POA: Diagnosis not present

## 2020-03-08 DIAGNOSIS — Z992 Dependence on renal dialysis: Secondary | ICD-10-CM | POA: Diagnosis not present

## 2020-03-10 DIAGNOSIS — N2581 Secondary hyperparathyroidism of renal origin: Secondary | ICD-10-CM | POA: Diagnosis not present

## 2020-03-10 DIAGNOSIS — D631 Anemia in chronic kidney disease: Secondary | ICD-10-CM | POA: Diagnosis not present

## 2020-03-10 DIAGNOSIS — Z992 Dependence on renal dialysis: Secondary | ICD-10-CM | POA: Diagnosis not present

## 2020-03-10 DIAGNOSIS — N186 End stage renal disease: Secondary | ICD-10-CM | POA: Diagnosis not present

## 2020-03-10 DIAGNOSIS — Z23 Encounter for immunization: Secondary | ICD-10-CM | POA: Diagnosis not present

## 2020-03-10 DIAGNOSIS — D509 Iron deficiency anemia, unspecified: Secondary | ICD-10-CM | POA: Diagnosis not present

## 2020-03-13 DIAGNOSIS — D509 Iron deficiency anemia, unspecified: Secondary | ICD-10-CM | POA: Diagnosis not present

## 2020-03-13 DIAGNOSIS — Z23 Encounter for immunization: Secondary | ICD-10-CM | POA: Diagnosis not present

## 2020-03-13 DIAGNOSIS — D631 Anemia in chronic kidney disease: Secondary | ICD-10-CM | POA: Diagnosis not present

## 2020-03-13 DIAGNOSIS — Z992 Dependence on renal dialysis: Secondary | ICD-10-CM | POA: Diagnosis not present

## 2020-03-13 DIAGNOSIS — N2581 Secondary hyperparathyroidism of renal origin: Secondary | ICD-10-CM | POA: Diagnosis not present

## 2020-03-13 DIAGNOSIS — N186 End stage renal disease: Secondary | ICD-10-CM | POA: Diagnosis not present

## 2020-03-14 DIAGNOSIS — I120 Hypertensive chronic kidney disease with stage 5 chronic kidney disease or end stage renal disease: Secondary | ICD-10-CM | POA: Diagnosis not present

## 2020-03-14 DIAGNOSIS — M109 Gout, unspecified: Secondary | ICD-10-CM | POA: Diagnosis not present

## 2020-03-14 DIAGNOSIS — R2232 Localized swelling, mass and lump, left upper limb: Secondary | ICD-10-CM | POA: Diagnosis not present

## 2020-03-14 DIAGNOSIS — I272 Pulmonary hypertension, unspecified: Secondary | ICD-10-CM | POA: Diagnosis not present

## 2020-03-14 DIAGNOSIS — Z992 Dependence on renal dialysis: Secondary | ICD-10-CM | POA: Diagnosis not present

## 2020-03-14 DIAGNOSIS — T8623 Heart transplant infection: Secondary | ICD-10-CM | POA: Diagnosis not present

## 2020-03-14 DIAGNOSIS — D849 Immunodeficiency, unspecified: Secondary | ICD-10-CM | POA: Diagnosis not present

## 2020-03-14 DIAGNOSIS — Z794 Long term (current) use of insulin: Secondary | ICD-10-CM | POA: Diagnosis not present

## 2020-03-14 DIAGNOSIS — Z87891 Personal history of nicotine dependence: Secondary | ICD-10-CM | POA: Diagnosis not present

## 2020-03-14 DIAGNOSIS — R410 Disorientation, unspecified: Secondary | ICD-10-CM | POA: Diagnosis not present

## 2020-03-14 DIAGNOSIS — Z7982 Long term (current) use of aspirin: Secondary | ICD-10-CM | POA: Diagnosis not present

## 2020-03-14 DIAGNOSIS — E039 Hypothyroidism, unspecified: Secondary | ICD-10-CM | POA: Diagnosis not present

## 2020-03-14 DIAGNOSIS — Z79899 Other long term (current) drug therapy: Secondary | ICD-10-CM | POA: Diagnosis not present

## 2020-03-14 DIAGNOSIS — R42 Dizziness and giddiness: Secondary | ICD-10-CM | POA: Diagnosis not present

## 2020-03-14 DIAGNOSIS — A310 Pulmonary mycobacterial infection: Secondary | ICD-10-CM | POA: Diagnosis not present

## 2020-03-14 DIAGNOSIS — Z7189 Other specified counseling: Secondary | ICD-10-CM | POA: Diagnosis not present

## 2020-03-14 DIAGNOSIS — E1122 Type 2 diabetes mellitus with diabetic chronic kidney disease: Secondary | ICD-10-CM | POA: Diagnosis not present

## 2020-03-14 DIAGNOSIS — N186 End stage renal disease: Secondary | ICD-10-CM | POA: Diagnosis not present

## 2020-03-14 DIAGNOSIS — Z792 Long term (current) use of antibiotics: Secondary | ICD-10-CM | POA: Diagnosis not present

## 2020-03-14 DIAGNOSIS — G4733 Obstructive sleep apnea (adult) (pediatric): Secondary | ICD-10-CM | POA: Diagnosis not present

## 2020-03-15 DIAGNOSIS — D509 Iron deficiency anemia, unspecified: Secondary | ICD-10-CM | POA: Diagnosis not present

## 2020-03-15 DIAGNOSIS — D631 Anemia in chronic kidney disease: Secondary | ICD-10-CM | POA: Diagnosis not present

## 2020-03-15 DIAGNOSIS — N186 End stage renal disease: Secondary | ICD-10-CM | POA: Diagnosis not present

## 2020-03-15 DIAGNOSIS — Z23 Encounter for immunization: Secondary | ICD-10-CM | POA: Diagnosis not present

## 2020-03-15 DIAGNOSIS — N2581 Secondary hyperparathyroidism of renal origin: Secondary | ICD-10-CM | POA: Diagnosis not present

## 2020-03-15 DIAGNOSIS — Z992 Dependence on renal dialysis: Secondary | ICD-10-CM | POA: Diagnosis not present

## 2020-03-17 DIAGNOSIS — N186 End stage renal disease: Secondary | ICD-10-CM | POA: Diagnosis not present

## 2020-03-17 DIAGNOSIS — Z992 Dependence on renal dialysis: Secondary | ICD-10-CM | POA: Diagnosis not present

## 2020-03-17 DIAGNOSIS — Z23 Encounter for immunization: Secondary | ICD-10-CM | POA: Diagnosis not present

## 2020-03-17 DIAGNOSIS — D509 Iron deficiency anemia, unspecified: Secondary | ICD-10-CM | POA: Diagnosis not present

## 2020-03-17 DIAGNOSIS — N2581 Secondary hyperparathyroidism of renal origin: Secondary | ICD-10-CM | POA: Diagnosis not present

## 2020-03-17 DIAGNOSIS — D631 Anemia in chronic kidney disease: Secondary | ICD-10-CM | POA: Diagnosis not present

## 2020-03-20 DIAGNOSIS — Z23 Encounter for immunization: Secondary | ICD-10-CM | POA: Diagnosis not present

## 2020-03-20 DIAGNOSIS — N186 End stage renal disease: Secondary | ICD-10-CM | POA: Diagnosis not present

## 2020-03-20 DIAGNOSIS — N2581 Secondary hyperparathyroidism of renal origin: Secondary | ICD-10-CM | POA: Diagnosis not present

## 2020-03-20 DIAGNOSIS — D631 Anemia in chronic kidney disease: Secondary | ICD-10-CM | POA: Diagnosis not present

## 2020-03-20 DIAGNOSIS — Z992 Dependence on renal dialysis: Secondary | ICD-10-CM | POA: Diagnosis not present

## 2020-03-20 DIAGNOSIS — D509 Iron deficiency anemia, unspecified: Secondary | ICD-10-CM | POA: Diagnosis not present

## 2020-03-22 DIAGNOSIS — Z23 Encounter for immunization: Secondary | ICD-10-CM | POA: Diagnosis not present

## 2020-03-22 DIAGNOSIS — N2581 Secondary hyperparathyroidism of renal origin: Secondary | ICD-10-CM | POA: Diagnosis not present

## 2020-03-22 DIAGNOSIS — Z992 Dependence on renal dialysis: Secondary | ICD-10-CM | POA: Diagnosis not present

## 2020-03-22 DIAGNOSIS — N186 End stage renal disease: Secondary | ICD-10-CM | POA: Diagnosis not present

## 2020-03-22 DIAGNOSIS — D509 Iron deficiency anemia, unspecified: Secondary | ICD-10-CM | POA: Diagnosis not present

## 2020-03-22 DIAGNOSIS — D631 Anemia in chronic kidney disease: Secondary | ICD-10-CM | POA: Diagnosis not present

## 2020-03-24 DIAGNOSIS — Z992 Dependence on renal dialysis: Secondary | ICD-10-CM | POA: Diagnosis not present

## 2020-03-24 DIAGNOSIS — T8620 Unspecified complication of heart transplant: Secondary | ICD-10-CM | POA: Diagnosis not present

## 2020-03-24 DIAGNOSIS — N2581 Secondary hyperparathyroidism of renal origin: Secondary | ICD-10-CM | POA: Diagnosis not present

## 2020-03-24 DIAGNOSIS — I120 Hypertensive chronic kidney disease with stage 5 chronic kidney disease or end stage renal disease: Secondary | ICD-10-CM | POA: Diagnosis not present

## 2020-03-24 DIAGNOSIS — N186 End stage renal disease: Secondary | ICD-10-CM | POA: Diagnosis not present

## 2020-03-24 DIAGNOSIS — E1122 Type 2 diabetes mellitus with diabetic chronic kidney disease: Secondary | ICD-10-CM | POA: Diagnosis not present

## 2020-03-24 DIAGNOSIS — D631 Anemia in chronic kidney disease: Secondary | ICD-10-CM | POA: Diagnosis not present

## 2020-03-26 DIAGNOSIS — I120 Hypertensive chronic kidney disease with stage 5 chronic kidney disease or end stage renal disease: Secondary | ICD-10-CM | POA: Diagnosis not present

## 2020-03-26 DIAGNOSIS — N186 End stage renal disease: Secondary | ICD-10-CM | POA: Diagnosis not present

## 2020-03-26 DIAGNOSIS — Z992 Dependence on renal dialysis: Secondary | ICD-10-CM | POA: Diagnosis not present

## 2020-03-26 DIAGNOSIS — E1122 Type 2 diabetes mellitus with diabetic chronic kidney disease: Secondary | ICD-10-CM | POA: Diagnosis not present

## 2020-03-27 DIAGNOSIS — N186 End stage renal disease: Secondary | ICD-10-CM | POA: Diagnosis not present

## 2020-03-27 DIAGNOSIS — N2581 Secondary hyperparathyroidism of renal origin: Secondary | ICD-10-CM | POA: Diagnosis not present

## 2020-03-27 DIAGNOSIS — D631 Anemia in chronic kidney disease: Secondary | ICD-10-CM | POA: Diagnosis not present

## 2020-03-27 DIAGNOSIS — Z992 Dependence on renal dialysis: Secondary | ICD-10-CM | POA: Diagnosis not present

## 2020-03-29 DIAGNOSIS — N2581 Secondary hyperparathyroidism of renal origin: Secondary | ICD-10-CM | POA: Diagnosis not present

## 2020-03-29 DIAGNOSIS — N186 End stage renal disease: Secondary | ICD-10-CM | POA: Diagnosis not present

## 2020-03-29 DIAGNOSIS — D631 Anemia in chronic kidney disease: Secondary | ICD-10-CM | POA: Diagnosis not present

## 2020-03-29 DIAGNOSIS — Z992 Dependence on renal dialysis: Secondary | ICD-10-CM | POA: Diagnosis not present

## 2020-03-30 DIAGNOSIS — E1122 Type 2 diabetes mellitus with diabetic chronic kidney disease: Secondary | ICD-10-CM | POA: Diagnosis not present

## 2020-03-30 DIAGNOSIS — N186 End stage renal disease: Secondary | ICD-10-CM | POA: Diagnosis not present

## 2020-03-30 DIAGNOSIS — Z992 Dependence on renal dialysis: Secondary | ICD-10-CM | POA: Diagnosis not present

## 2020-03-30 DIAGNOSIS — Z87891 Personal history of nicotine dependence: Secondary | ICD-10-CM | POA: Diagnosis not present

## 2020-03-31 DIAGNOSIS — D631 Anemia in chronic kidney disease: Secondary | ICD-10-CM | POA: Diagnosis not present

## 2020-03-31 DIAGNOSIS — N2581 Secondary hyperparathyroidism of renal origin: Secondary | ICD-10-CM | POA: Diagnosis not present

## 2020-03-31 DIAGNOSIS — N186 End stage renal disease: Secondary | ICD-10-CM | POA: Diagnosis not present

## 2020-03-31 DIAGNOSIS — Z992 Dependence on renal dialysis: Secondary | ICD-10-CM | POA: Diagnosis not present

## 2020-04-03 DIAGNOSIS — N2581 Secondary hyperparathyroidism of renal origin: Secondary | ICD-10-CM | POA: Diagnosis not present

## 2020-04-03 DIAGNOSIS — D631 Anemia in chronic kidney disease: Secondary | ICD-10-CM | POA: Diagnosis not present

## 2020-04-03 DIAGNOSIS — Z992 Dependence on renal dialysis: Secondary | ICD-10-CM | POA: Diagnosis not present

## 2020-04-03 DIAGNOSIS — N186 End stage renal disease: Secondary | ICD-10-CM | POA: Diagnosis not present

## 2020-04-05 DIAGNOSIS — D631 Anemia in chronic kidney disease: Secondary | ICD-10-CM | POA: Diagnosis not present

## 2020-04-05 DIAGNOSIS — Z992 Dependence on renal dialysis: Secondary | ICD-10-CM | POA: Diagnosis not present

## 2020-04-05 DIAGNOSIS — N186 End stage renal disease: Secondary | ICD-10-CM | POA: Diagnosis not present

## 2020-04-05 DIAGNOSIS — N2581 Secondary hyperparathyroidism of renal origin: Secondary | ICD-10-CM | POA: Diagnosis not present

## 2020-04-07 DIAGNOSIS — N2581 Secondary hyperparathyroidism of renal origin: Secondary | ICD-10-CM | POA: Diagnosis not present

## 2020-04-07 DIAGNOSIS — D631 Anemia in chronic kidney disease: Secondary | ICD-10-CM | POA: Diagnosis not present

## 2020-04-07 DIAGNOSIS — Z992 Dependence on renal dialysis: Secondary | ICD-10-CM | POA: Diagnosis not present

## 2020-04-07 DIAGNOSIS — N186 End stage renal disease: Secondary | ICD-10-CM | POA: Diagnosis not present

## 2020-04-10 DIAGNOSIS — N2581 Secondary hyperparathyroidism of renal origin: Secondary | ICD-10-CM | POA: Diagnosis not present

## 2020-04-10 DIAGNOSIS — D631 Anemia in chronic kidney disease: Secondary | ICD-10-CM | POA: Diagnosis not present

## 2020-04-10 DIAGNOSIS — Z992 Dependence on renal dialysis: Secondary | ICD-10-CM | POA: Diagnosis not present

## 2020-04-10 DIAGNOSIS — N186 End stage renal disease: Secondary | ICD-10-CM | POA: Diagnosis not present

## 2020-04-11 DIAGNOSIS — T82590A Other mechanical complication of surgically created arteriovenous fistula, initial encounter: Secondary | ICD-10-CM | POA: Diagnosis not present

## 2020-04-11 DIAGNOSIS — Z992 Dependence on renal dialysis: Secondary | ICD-10-CM | POA: Diagnosis not present

## 2020-04-11 DIAGNOSIS — N186 End stage renal disease: Secondary | ICD-10-CM | POA: Diagnosis not present

## 2020-04-12 DIAGNOSIS — N186 End stage renal disease: Secondary | ICD-10-CM | POA: Diagnosis not present

## 2020-04-12 DIAGNOSIS — Z992 Dependence on renal dialysis: Secondary | ICD-10-CM | POA: Diagnosis not present

## 2020-04-12 DIAGNOSIS — N2581 Secondary hyperparathyroidism of renal origin: Secondary | ICD-10-CM | POA: Diagnosis not present

## 2020-04-12 DIAGNOSIS — D631 Anemia in chronic kidney disease: Secondary | ICD-10-CM | POA: Diagnosis not present

## 2020-04-14 DIAGNOSIS — Z992 Dependence on renal dialysis: Secondary | ICD-10-CM | POA: Diagnosis not present

## 2020-04-14 DIAGNOSIS — N186 End stage renal disease: Secondary | ICD-10-CM | POA: Diagnosis not present

## 2020-04-14 DIAGNOSIS — D631 Anemia in chronic kidney disease: Secondary | ICD-10-CM | POA: Diagnosis not present

## 2020-04-14 DIAGNOSIS — N2581 Secondary hyperparathyroidism of renal origin: Secondary | ICD-10-CM | POA: Diagnosis not present

## 2020-04-17 DIAGNOSIS — D631 Anemia in chronic kidney disease: Secondary | ICD-10-CM | POA: Diagnosis not present

## 2020-04-17 DIAGNOSIS — N2581 Secondary hyperparathyroidism of renal origin: Secondary | ICD-10-CM | POA: Diagnosis not present

## 2020-04-17 DIAGNOSIS — Z992 Dependence on renal dialysis: Secondary | ICD-10-CM | POA: Diagnosis not present

## 2020-04-17 DIAGNOSIS — N186 End stage renal disease: Secondary | ICD-10-CM | POA: Diagnosis not present

## 2020-04-19 DIAGNOSIS — N186 End stage renal disease: Secondary | ICD-10-CM | POA: Diagnosis not present

## 2020-04-19 DIAGNOSIS — Z992 Dependence on renal dialysis: Secondary | ICD-10-CM | POA: Diagnosis not present

## 2020-04-19 DIAGNOSIS — D631 Anemia in chronic kidney disease: Secondary | ICD-10-CM | POA: Diagnosis not present

## 2020-04-19 DIAGNOSIS — N2581 Secondary hyperparathyroidism of renal origin: Secondary | ICD-10-CM | POA: Diagnosis not present

## 2020-04-21 DIAGNOSIS — N186 End stage renal disease: Secondary | ICD-10-CM | POA: Diagnosis not present

## 2020-04-21 DIAGNOSIS — Z992 Dependence on renal dialysis: Secondary | ICD-10-CM | POA: Diagnosis not present

## 2020-04-21 DIAGNOSIS — D631 Anemia in chronic kidney disease: Secondary | ICD-10-CM | POA: Diagnosis not present

## 2020-04-21 DIAGNOSIS — N2581 Secondary hyperparathyroidism of renal origin: Secondary | ICD-10-CM | POA: Diagnosis not present

## 2020-04-24 DIAGNOSIS — H463 Toxic optic neuropathy: Secondary | ICD-10-CM | POA: Diagnosis not present

## 2020-04-24 DIAGNOSIS — H40013 Open angle with borderline findings, low risk, bilateral: Secondary | ICD-10-CM | POA: Diagnosis not present

## 2020-04-27 DIAGNOSIS — Z79899 Other long term (current) drug therapy: Secondary | ICD-10-CM | POA: Diagnosis not present

## 2020-05-02 DIAGNOSIS — A310 Pulmonary mycobacterial infection: Secondary | ICD-10-CM | POA: Diagnosis not present

## 2020-05-02 DIAGNOSIS — R2232 Localized swelling, mass and lump, left upper limb: Secondary | ICD-10-CM | POA: Diagnosis not present

## 2020-05-02 DIAGNOSIS — Z792 Long term (current) use of antibiotics: Secondary | ICD-10-CM | POA: Diagnosis not present

## 2020-05-02 DIAGNOSIS — T371X5D Adverse effect of antimycobacterial drugs, subsequent encounter: Secondary | ICD-10-CM | POA: Diagnosis not present

## 2020-05-02 DIAGNOSIS — Z9119 Patient's noncompliance with other medical treatment and regimen: Secondary | ICD-10-CM | POA: Diagnosis not present

## 2020-05-04 DIAGNOSIS — H543 Unqualified visual loss, both eyes: Secondary | ICD-10-CM | POA: Diagnosis not present

## 2020-05-04 DIAGNOSIS — E119 Type 2 diabetes mellitus without complications: Secondary | ICD-10-CM | POA: Diagnosis not present

## 2020-05-04 DIAGNOSIS — H25013 Cortical age-related cataract, bilateral: Secondary | ICD-10-CM | POA: Diagnosis not present

## 2020-05-24 DIAGNOSIS — T8620 Unspecified complication of heart transplant: Secondary | ICD-10-CM | POA: Diagnosis not present

## 2020-05-24 DIAGNOSIS — N186 End stage renal disease: Secondary | ICD-10-CM | POA: Diagnosis not present

## 2020-05-24 DIAGNOSIS — N2581 Secondary hyperparathyroidism of renal origin: Secondary | ICD-10-CM | POA: Diagnosis not present

## 2020-05-24 DIAGNOSIS — Z23 Encounter for immunization: Secondary | ICD-10-CM | POA: Diagnosis not present

## 2020-05-24 DIAGNOSIS — Z992 Dependence on renal dialysis: Secondary | ICD-10-CM | POA: Diagnosis not present

## 2020-05-24 DIAGNOSIS — R509 Fever, unspecified: Secondary | ICD-10-CM | POA: Diagnosis not present

## 2020-05-24 DIAGNOSIS — D631 Anemia in chronic kidney disease: Secondary | ICD-10-CM | POA: Diagnosis not present

## 2020-05-26 DIAGNOSIS — N2581 Secondary hyperparathyroidism of renal origin: Secondary | ICD-10-CM | POA: Diagnosis not present

## 2020-05-26 DIAGNOSIS — Z992 Dependence on renal dialysis: Secondary | ICD-10-CM | POA: Diagnosis not present

## 2020-05-26 DIAGNOSIS — N186 End stage renal disease: Secondary | ICD-10-CM | POA: Diagnosis not present

## 2020-05-26 DIAGNOSIS — R509 Fever, unspecified: Secondary | ICD-10-CM | POA: Diagnosis not present

## 2020-05-26 DIAGNOSIS — Z23 Encounter for immunization: Secondary | ICD-10-CM | POA: Diagnosis not present

## 2020-05-26 DIAGNOSIS — D631 Anemia in chronic kidney disease: Secondary | ICD-10-CM | POA: Diagnosis not present

## 2020-05-29 DIAGNOSIS — Z23 Encounter for immunization: Secondary | ICD-10-CM | POA: Diagnosis not present

## 2020-05-29 DIAGNOSIS — D631 Anemia in chronic kidney disease: Secondary | ICD-10-CM | POA: Diagnosis not present

## 2020-05-29 DIAGNOSIS — N186 End stage renal disease: Secondary | ICD-10-CM | POA: Diagnosis not present

## 2020-05-29 DIAGNOSIS — N2581 Secondary hyperparathyroidism of renal origin: Secondary | ICD-10-CM | POA: Diagnosis not present

## 2020-05-29 DIAGNOSIS — R509 Fever, unspecified: Secondary | ICD-10-CM | POA: Diagnosis not present

## 2020-05-29 DIAGNOSIS — Z992 Dependence on renal dialysis: Secondary | ICD-10-CM | POA: Diagnosis not present

## 2020-05-31 DIAGNOSIS — Z23 Encounter for immunization: Secondary | ICD-10-CM | POA: Diagnosis not present

## 2020-05-31 DIAGNOSIS — D631 Anemia in chronic kidney disease: Secondary | ICD-10-CM | POA: Diagnosis not present

## 2020-05-31 DIAGNOSIS — R509 Fever, unspecified: Secondary | ICD-10-CM | POA: Diagnosis not present

## 2020-05-31 DIAGNOSIS — N2581 Secondary hyperparathyroidism of renal origin: Secondary | ICD-10-CM | POA: Diagnosis not present

## 2020-05-31 DIAGNOSIS — N186 End stage renal disease: Secondary | ICD-10-CM | POA: Diagnosis not present

## 2020-05-31 DIAGNOSIS — E119 Type 2 diabetes mellitus without complications: Secondary | ICD-10-CM | POA: Diagnosis not present

## 2020-05-31 DIAGNOSIS — Z992 Dependence on renal dialysis: Secondary | ICD-10-CM | POA: Diagnosis not present

## 2020-06-01 DIAGNOSIS — Z79899 Other long term (current) drug therapy: Secondary | ICD-10-CM | POA: Diagnosis not present

## 2020-06-01 DIAGNOSIS — H53413 Scotoma involving central area, bilateral: Secondary | ICD-10-CM | POA: Diagnosis not present

## 2020-06-01 DIAGNOSIS — H543 Unqualified visual loss, both eyes: Secondary | ICD-10-CM | POA: Diagnosis not present

## 2020-06-02 DIAGNOSIS — N186 End stage renal disease: Secondary | ICD-10-CM | POA: Diagnosis not present

## 2020-06-02 DIAGNOSIS — Z23 Encounter for immunization: Secondary | ICD-10-CM | POA: Diagnosis not present

## 2020-06-02 DIAGNOSIS — D631 Anemia in chronic kidney disease: Secondary | ICD-10-CM | POA: Diagnosis not present

## 2020-06-02 DIAGNOSIS — N2581 Secondary hyperparathyroidism of renal origin: Secondary | ICD-10-CM | POA: Diagnosis not present

## 2020-06-02 DIAGNOSIS — Z992 Dependence on renal dialysis: Secondary | ICD-10-CM | POA: Diagnosis not present

## 2020-06-02 DIAGNOSIS — R509 Fever, unspecified: Secondary | ICD-10-CM | POA: Diagnosis not present

## 2020-06-05 DIAGNOSIS — D631 Anemia in chronic kidney disease: Secondary | ICD-10-CM | POA: Diagnosis not present

## 2020-06-05 DIAGNOSIS — Z23 Encounter for immunization: Secondary | ICD-10-CM | POA: Diagnosis not present

## 2020-06-05 DIAGNOSIS — R509 Fever, unspecified: Secondary | ICD-10-CM | POA: Diagnosis not present

## 2020-06-05 DIAGNOSIS — N186 End stage renal disease: Secondary | ICD-10-CM | POA: Diagnosis not present

## 2020-06-05 DIAGNOSIS — Z992 Dependence on renal dialysis: Secondary | ICD-10-CM | POA: Diagnosis not present

## 2020-06-05 DIAGNOSIS — N2581 Secondary hyperparathyroidism of renal origin: Secondary | ICD-10-CM | POA: Diagnosis not present

## 2020-06-07 DIAGNOSIS — Z23 Encounter for immunization: Secondary | ICD-10-CM | POA: Diagnosis not present

## 2020-06-07 DIAGNOSIS — R509 Fever, unspecified: Secondary | ICD-10-CM | POA: Diagnosis not present

## 2020-06-07 DIAGNOSIS — N186 End stage renal disease: Secondary | ICD-10-CM | POA: Diagnosis not present

## 2020-06-07 DIAGNOSIS — N2581 Secondary hyperparathyroidism of renal origin: Secondary | ICD-10-CM | POA: Diagnosis not present

## 2020-06-07 DIAGNOSIS — Z992 Dependence on renal dialysis: Secondary | ICD-10-CM | POA: Diagnosis not present

## 2020-06-07 DIAGNOSIS — D631 Anemia in chronic kidney disease: Secondary | ICD-10-CM | POA: Diagnosis not present

## 2020-06-09 DIAGNOSIS — R509 Fever, unspecified: Secondary | ICD-10-CM | POA: Diagnosis not present

## 2020-06-09 DIAGNOSIS — N2581 Secondary hyperparathyroidism of renal origin: Secondary | ICD-10-CM | POA: Diagnosis not present

## 2020-06-09 DIAGNOSIS — Z23 Encounter for immunization: Secondary | ICD-10-CM | POA: Diagnosis not present

## 2020-06-09 DIAGNOSIS — Z992 Dependence on renal dialysis: Secondary | ICD-10-CM | POA: Diagnosis not present

## 2020-06-09 DIAGNOSIS — D631 Anemia in chronic kidney disease: Secondary | ICD-10-CM | POA: Diagnosis not present

## 2020-06-09 DIAGNOSIS — N186 End stage renal disease: Secondary | ICD-10-CM | POA: Diagnosis not present

## 2020-06-12 DIAGNOSIS — D631 Anemia in chronic kidney disease: Secondary | ICD-10-CM | POA: Diagnosis not present

## 2020-06-12 DIAGNOSIS — N2581 Secondary hyperparathyroidism of renal origin: Secondary | ICD-10-CM | POA: Diagnosis not present

## 2020-06-12 DIAGNOSIS — Z992 Dependence on renal dialysis: Secondary | ICD-10-CM | POA: Diagnosis not present

## 2020-06-12 DIAGNOSIS — N186 End stage renal disease: Secondary | ICD-10-CM | POA: Diagnosis not present

## 2020-06-12 DIAGNOSIS — Z23 Encounter for immunization: Secondary | ICD-10-CM | POA: Diagnosis not present

## 2020-06-12 DIAGNOSIS — R509 Fever, unspecified: Secondary | ICD-10-CM | POA: Diagnosis not present

## 2020-06-14 DIAGNOSIS — R509 Fever, unspecified: Secondary | ICD-10-CM | POA: Diagnosis not present

## 2020-06-14 DIAGNOSIS — D631 Anemia in chronic kidney disease: Secondary | ICD-10-CM | POA: Diagnosis not present

## 2020-06-14 DIAGNOSIS — Z992 Dependence on renal dialysis: Secondary | ICD-10-CM | POA: Diagnosis not present

## 2020-06-14 DIAGNOSIS — N2581 Secondary hyperparathyroidism of renal origin: Secondary | ICD-10-CM | POA: Diagnosis not present

## 2020-06-14 DIAGNOSIS — Z23 Encounter for immunization: Secondary | ICD-10-CM | POA: Diagnosis not present

## 2020-06-14 DIAGNOSIS — N186 End stage renal disease: Secondary | ICD-10-CM | POA: Diagnosis not present

## 2020-06-15 DIAGNOSIS — E1122 Type 2 diabetes mellitus with diabetic chronic kidney disease: Secondary | ICD-10-CM | POA: Diagnosis not present

## 2020-06-15 DIAGNOSIS — Z7982 Long term (current) use of aspirin: Secondary | ICD-10-CM | POA: Diagnosis not present

## 2020-06-15 DIAGNOSIS — Z48298 Encounter for aftercare following other organ transplant: Secondary | ICD-10-CM | POA: Diagnosis not present

## 2020-06-15 DIAGNOSIS — Z79899 Other long term (current) drug therapy: Secondary | ICD-10-CM | POA: Diagnosis not present

## 2020-06-15 DIAGNOSIS — I120 Hypertensive chronic kidney disease with stage 5 chronic kidney disease or end stage renal disease: Secondary | ICD-10-CM | POA: Diagnosis not present

## 2020-06-15 DIAGNOSIS — D849 Immunodeficiency, unspecified: Secondary | ICD-10-CM | POA: Diagnosis not present

## 2020-06-15 DIAGNOSIS — N185 Chronic kidney disease, stage 5: Secondary | ICD-10-CM | POA: Diagnosis not present

## 2020-06-15 DIAGNOSIS — Z952 Presence of prosthetic heart valve: Secondary | ICD-10-CM | POA: Diagnosis not present

## 2020-06-15 DIAGNOSIS — N186 End stage renal disease: Secondary | ICD-10-CM | POA: Diagnosis not present

## 2020-06-15 DIAGNOSIS — I1311 Hypertensive heart and chronic kidney disease without heart failure, with stage 5 chronic kidney disease, or end stage renal disease: Secondary | ICD-10-CM | POA: Diagnosis not present

## 2020-06-15 DIAGNOSIS — Z4821 Encounter for aftercare following heart transplant: Secondary | ICD-10-CM | POA: Diagnosis not present

## 2020-06-15 DIAGNOSIS — Z941 Heart transplant status: Secondary | ICD-10-CM | POA: Diagnosis not present

## 2020-06-15 DIAGNOSIS — I959 Hypotension, unspecified: Secondary | ICD-10-CM | POA: Diagnosis not present

## 2020-06-15 DIAGNOSIS — Z125 Encounter for screening for malignant neoplasm of prostate: Secondary | ICD-10-CM | POA: Diagnosis not present

## 2020-06-15 DIAGNOSIS — Z298 Encounter for other specified prophylactic measures: Secondary | ICD-10-CM | POA: Diagnosis not present

## 2020-06-15 DIAGNOSIS — M109 Gout, unspecified: Secondary | ICD-10-CM | POA: Diagnosis not present

## 2020-06-15 DIAGNOSIS — E1140 Type 2 diabetes mellitus with diabetic neuropathy, unspecified: Secondary | ICD-10-CM | POA: Diagnosis not present

## 2020-06-15 DIAGNOSIS — Z992 Dependence on renal dialysis: Secondary | ICD-10-CM | POA: Diagnosis not present

## 2020-06-15 DIAGNOSIS — D84821 Immunodeficiency due to drugs: Secondary | ICD-10-CM | POA: Diagnosis not present

## 2020-06-15 DIAGNOSIS — Z7901 Long term (current) use of anticoagulants: Secondary | ICD-10-CM | POA: Diagnosis not present

## 2020-06-15 DIAGNOSIS — Z794 Long term (current) use of insulin: Secondary | ICD-10-CM | POA: Diagnosis not present

## 2020-06-16 DIAGNOSIS — D631 Anemia in chronic kidney disease: Secondary | ICD-10-CM | POA: Diagnosis not present

## 2020-06-16 DIAGNOSIS — N186 End stage renal disease: Secondary | ICD-10-CM | POA: Diagnosis not present

## 2020-06-16 DIAGNOSIS — N2581 Secondary hyperparathyroidism of renal origin: Secondary | ICD-10-CM | POA: Diagnosis not present

## 2020-06-16 DIAGNOSIS — Z23 Encounter for immunization: Secondary | ICD-10-CM | POA: Diagnosis not present

## 2020-06-16 DIAGNOSIS — R509 Fever, unspecified: Secondary | ICD-10-CM | POA: Diagnosis not present

## 2020-06-16 DIAGNOSIS — Z992 Dependence on renal dialysis: Secondary | ICD-10-CM | POA: Diagnosis not present

## 2020-06-18 DIAGNOSIS — S82891D Other fracture of right lower leg, subsequent encounter for closed fracture with routine healing: Secondary | ICD-10-CM | POA: Diagnosis not present

## 2020-06-19 DIAGNOSIS — Z992 Dependence on renal dialysis: Secondary | ICD-10-CM | POA: Diagnosis not present

## 2020-06-19 DIAGNOSIS — N186 End stage renal disease: Secondary | ICD-10-CM | POA: Diagnosis not present

## 2020-06-19 DIAGNOSIS — N2581 Secondary hyperparathyroidism of renal origin: Secondary | ICD-10-CM | POA: Diagnosis not present

## 2020-06-19 DIAGNOSIS — D631 Anemia in chronic kidney disease: Secondary | ICD-10-CM | POA: Diagnosis not present

## 2020-06-19 DIAGNOSIS — Z23 Encounter for immunization: Secondary | ICD-10-CM | POA: Diagnosis not present

## 2020-06-19 DIAGNOSIS — R509 Fever, unspecified: Secondary | ICD-10-CM | POA: Diagnosis not present

## 2020-06-21 DIAGNOSIS — D631 Anemia in chronic kidney disease: Secondary | ICD-10-CM | POA: Diagnosis not present

## 2020-06-21 DIAGNOSIS — N186 End stage renal disease: Secondary | ICD-10-CM | POA: Diagnosis not present

## 2020-06-21 DIAGNOSIS — Z992 Dependence on renal dialysis: Secondary | ICD-10-CM | POA: Diagnosis not present

## 2020-06-21 DIAGNOSIS — Z23 Encounter for immunization: Secondary | ICD-10-CM | POA: Diagnosis not present

## 2020-06-21 DIAGNOSIS — R509 Fever, unspecified: Secondary | ICD-10-CM | POA: Diagnosis not present

## 2020-06-21 DIAGNOSIS — N2581 Secondary hyperparathyroidism of renal origin: Secondary | ICD-10-CM | POA: Diagnosis not present

## 2020-06-23 DIAGNOSIS — N2581 Secondary hyperparathyroidism of renal origin: Secondary | ICD-10-CM | POA: Diagnosis not present

## 2020-06-23 DIAGNOSIS — Z992 Dependence on renal dialysis: Secondary | ICD-10-CM | POA: Diagnosis not present

## 2020-06-23 DIAGNOSIS — R509 Fever, unspecified: Secondary | ICD-10-CM | POA: Diagnosis not present

## 2020-06-23 DIAGNOSIS — D631 Anemia in chronic kidney disease: Secondary | ICD-10-CM | POA: Diagnosis not present

## 2020-06-23 DIAGNOSIS — Z23 Encounter for immunization: Secondary | ICD-10-CM | POA: Diagnosis not present

## 2020-06-23 DIAGNOSIS — N186 End stage renal disease: Secondary | ICD-10-CM | POA: Diagnosis not present

## 2020-06-24 DIAGNOSIS — T8620 Unspecified complication of heart transplant: Secondary | ICD-10-CM | POA: Diagnosis not present

## 2020-06-24 DIAGNOSIS — N186 End stage renal disease: Secondary | ICD-10-CM | POA: Diagnosis not present

## 2020-06-24 DIAGNOSIS — Z992 Dependence on renal dialysis: Secondary | ICD-10-CM | POA: Diagnosis not present

## 2020-06-26 DIAGNOSIS — D631 Anemia in chronic kidney disease: Secondary | ICD-10-CM | POA: Diagnosis not present

## 2020-06-26 DIAGNOSIS — N2581 Secondary hyperparathyroidism of renal origin: Secondary | ICD-10-CM | POA: Diagnosis not present

## 2020-06-26 DIAGNOSIS — N186 End stage renal disease: Secondary | ICD-10-CM | POA: Diagnosis not present

## 2020-06-26 DIAGNOSIS — Z992 Dependence on renal dialysis: Secondary | ICD-10-CM | POA: Diagnosis not present

## 2020-06-27 DIAGNOSIS — A319 Mycobacterial infection, unspecified: Secondary | ICD-10-CM | POA: Diagnosis not present

## 2020-06-27 DIAGNOSIS — H53413 Scotoma involving central area, bilateral: Secondary | ICD-10-CM | POA: Diagnosis not present

## 2020-06-27 DIAGNOSIS — H543 Unqualified visual loss, both eyes: Secondary | ICD-10-CM | POA: Diagnosis not present

## 2020-06-27 DIAGNOSIS — M19022 Primary osteoarthritis, left elbow: Secondary | ICD-10-CM | POA: Diagnosis not present

## 2020-06-28 DIAGNOSIS — N2581 Secondary hyperparathyroidism of renal origin: Secondary | ICD-10-CM | POA: Diagnosis not present

## 2020-06-28 DIAGNOSIS — D631 Anemia in chronic kidney disease: Secondary | ICD-10-CM | POA: Diagnosis not present

## 2020-06-28 DIAGNOSIS — Z992 Dependence on renal dialysis: Secondary | ICD-10-CM | POA: Diagnosis not present

## 2020-06-28 DIAGNOSIS — N186 End stage renal disease: Secondary | ICD-10-CM | POA: Diagnosis not present

## 2020-06-30 DIAGNOSIS — N186 End stage renal disease: Secondary | ICD-10-CM | POA: Diagnosis not present

## 2020-06-30 DIAGNOSIS — D631 Anemia in chronic kidney disease: Secondary | ICD-10-CM | POA: Diagnosis not present

## 2020-06-30 DIAGNOSIS — Z992 Dependence on renal dialysis: Secondary | ICD-10-CM | POA: Diagnosis not present

## 2020-06-30 DIAGNOSIS — N2581 Secondary hyperparathyroidism of renal origin: Secondary | ICD-10-CM | POA: Diagnosis not present

## 2020-07-03 DIAGNOSIS — N186 End stage renal disease: Secondary | ICD-10-CM | POA: Diagnosis not present

## 2020-07-03 DIAGNOSIS — Z992 Dependence on renal dialysis: Secondary | ICD-10-CM | POA: Diagnosis not present

## 2020-07-03 DIAGNOSIS — D631 Anemia in chronic kidney disease: Secondary | ICD-10-CM | POA: Diagnosis not present

## 2020-07-03 DIAGNOSIS — N2581 Secondary hyperparathyroidism of renal origin: Secondary | ICD-10-CM | POA: Diagnosis not present

## 2020-07-05 DIAGNOSIS — D631 Anemia in chronic kidney disease: Secondary | ICD-10-CM | POA: Diagnosis not present

## 2020-07-05 DIAGNOSIS — Z992 Dependence on renal dialysis: Secondary | ICD-10-CM | POA: Diagnosis not present

## 2020-07-05 DIAGNOSIS — N2581 Secondary hyperparathyroidism of renal origin: Secondary | ICD-10-CM | POA: Diagnosis not present

## 2020-07-05 DIAGNOSIS — N186 End stage renal disease: Secondary | ICD-10-CM | POA: Diagnosis not present

## 2020-07-07 DIAGNOSIS — N2581 Secondary hyperparathyroidism of renal origin: Secondary | ICD-10-CM | POA: Diagnosis not present

## 2020-07-07 DIAGNOSIS — D631 Anemia in chronic kidney disease: Secondary | ICD-10-CM | POA: Diagnosis not present

## 2020-07-07 DIAGNOSIS — N186 End stage renal disease: Secondary | ICD-10-CM | POA: Diagnosis not present

## 2020-07-07 DIAGNOSIS — Z992 Dependence on renal dialysis: Secondary | ICD-10-CM | POA: Diagnosis not present

## 2020-07-10 DIAGNOSIS — Z992 Dependence on renal dialysis: Secondary | ICD-10-CM | POA: Diagnosis not present

## 2020-07-10 DIAGNOSIS — D631 Anemia in chronic kidney disease: Secondary | ICD-10-CM | POA: Diagnosis not present

## 2020-07-10 DIAGNOSIS — N186 End stage renal disease: Secondary | ICD-10-CM | POA: Diagnosis not present

## 2020-07-10 DIAGNOSIS — N2581 Secondary hyperparathyroidism of renal origin: Secondary | ICD-10-CM | POA: Diagnosis not present

## 2020-07-11 DIAGNOSIS — A310 Pulmonary mycobacterial infection: Secondary | ICD-10-CM | POA: Diagnosis not present

## 2020-07-11 DIAGNOSIS — Z79899 Other long term (current) drug therapy: Secondary | ICD-10-CM | POA: Diagnosis not present

## 2020-07-11 DIAGNOSIS — Z992 Dependence on renal dialysis: Secondary | ICD-10-CM | POA: Diagnosis not present

## 2020-07-11 DIAGNOSIS — R2232 Localized swelling, mass and lump, left upper limb: Secondary | ICD-10-CM | POA: Diagnosis not present

## 2020-07-11 DIAGNOSIS — A319 Mycobacterial infection, unspecified: Secondary | ICD-10-CM | POA: Diagnosis not present

## 2020-07-11 DIAGNOSIS — H543 Unqualified visual loss, both eyes: Secondary | ICD-10-CM | POA: Diagnosis not present

## 2020-07-11 DIAGNOSIS — D849 Immunodeficiency, unspecified: Secondary | ICD-10-CM | POA: Diagnosis not present

## 2020-07-11 DIAGNOSIS — Z941 Heart transplant status: Secondary | ICD-10-CM | POA: Diagnosis not present

## 2020-07-12 DIAGNOSIS — D631 Anemia in chronic kidney disease: Secondary | ICD-10-CM | POA: Diagnosis not present

## 2020-07-12 DIAGNOSIS — N186 End stage renal disease: Secondary | ICD-10-CM | POA: Diagnosis not present

## 2020-07-12 DIAGNOSIS — N2581 Secondary hyperparathyroidism of renal origin: Secondary | ICD-10-CM | POA: Diagnosis not present

## 2020-07-12 DIAGNOSIS — Z992 Dependence on renal dialysis: Secondary | ICD-10-CM | POA: Diagnosis not present

## 2020-07-14 DIAGNOSIS — D631 Anemia in chronic kidney disease: Secondary | ICD-10-CM | POA: Diagnosis not present

## 2020-07-14 DIAGNOSIS — N186 End stage renal disease: Secondary | ICD-10-CM | POA: Diagnosis not present

## 2020-07-14 DIAGNOSIS — Z992 Dependence on renal dialysis: Secondary | ICD-10-CM | POA: Diagnosis not present

## 2020-07-14 DIAGNOSIS — N2581 Secondary hyperparathyroidism of renal origin: Secondary | ICD-10-CM | POA: Diagnosis not present

## 2020-07-17 DIAGNOSIS — Z992 Dependence on renal dialysis: Secondary | ICD-10-CM | POA: Diagnosis not present

## 2020-07-17 DIAGNOSIS — N2581 Secondary hyperparathyroidism of renal origin: Secondary | ICD-10-CM | POA: Diagnosis not present

## 2020-07-17 DIAGNOSIS — N186 End stage renal disease: Secondary | ICD-10-CM | POA: Diagnosis not present

## 2020-07-17 DIAGNOSIS — D631 Anemia in chronic kidney disease: Secondary | ICD-10-CM | POA: Diagnosis not present

## 2020-07-18 DIAGNOSIS — E119 Type 2 diabetes mellitus without complications: Secondary | ICD-10-CM | POA: Diagnosis not present

## 2020-07-18 DIAGNOSIS — M71122 Other infective bursitis, left elbow: Secondary | ICD-10-CM | POA: Diagnosis not present

## 2020-07-18 DIAGNOSIS — M12522 Traumatic arthropathy, left elbow: Secondary | ICD-10-CM | POA: Diagnosis not present

## 2020-07-19 DIAGNOSIS — Z992 Dependence on renal dialysis: Secondary | ICD-10-CM | POA: Diagnosis not present

## 2020-07-19 DIAGNOSIS — N2581 Secondary hyperparathyroidism of renal origin: Secondary | ICD-10-CM | POA: Diagnosis not present

## 2020-07-19 DIAGNOSIS — N186 End stage renal disease: Secondary | ICD-10-CM | POA: Diagnosis not present

## 2020-07-19 DIAGNOSIS — D631 Anemia in chronic kidney disease: Secondary | ICD-10-CM | POA: Diagnosis not present

## 2020-07-20 DIAGNOSIS — H25813 Combined forms of age-related cataract, bilateral: Secondary | ICD-10-CM | POA: Diagnosis not present

## 2020-07-20 DIAGNOSIS — H53413 Scotoma involving central area, bilateral: Secondary | ICD-10-CM | POA: Diagnosis not present

## 2020-07-20 DIAGNOSIS — H543 Unqualified visual loss, both eyes: Secondary | ICD-10-CM | POA: Diagnosis not present

## 2020-07-21 DIAGNOSIS — N186 End stage renal disease: Secondary | ICD-10-CM | POA: Diagnosis not present

## 2020-07-21 DIAGNOSIS — N2581 Secondary hyperparathyroidism of renal origin: Secondary | ICD-10-CM | POA: Diagnosis not present

## 2020-07-21 DIAGNOSIS — Z992 Dependence on renal dialysis: Secondary | ICD-10-CM | POA: Diagnosis not present

## 2020-07-21 DIAGNOSIS — D631 Anemia in chronic kidney disease: Secondary | ICD-10-CM | POA: Diagnosis not present

## 2020-07-24 DIAGNOSIS — Z992 Dependence on renal dialysis: Secondary | ICD-10-CM | POA: Diagnosis not present

## 2020-07-24 DIAGNOSIS — N2581 Secondary hyperparathyroidism of renal origin: Secondary | ICD-10-CM | POA: Diagnosis not present

## 2020-07-24 DIAGNOSIS — N186 End stage renal disease: Secondary | ICD-10-CM | POA: Diagnosis not present

## 2020-07-24 DIAGNOSIS — D631 Anemia in chronic kidney disease: Secondary | ICD-10-CM | POA: Diagnosis not present

## 2020-07-25 DIAGNOSIS — T8620 Unspecified complication of heart transplant: Secondary | ICD-10-CM | POA: Diagnosis not present

## 2020-07-25 DIAGNOSIS — N186 End stage renal disease: Secondary | ICD-10-CM | POA: Diagnosis not present

## 2020-07-25 DIAGNOSIS — Z992 Dependence on renal dialysis: Secondary | ICD-10-CM | POA: Diagnosis not present

## 2020-07-26 DIAGNOSIS — Z992 Dependence on renal dialysis: Secondary | ICD-10-CM | POA: Diagnosis not present

## 2020-07-26 DIAGNOSIS — D631 Anemia in chronic kidney disease: Secondary | ICD-10-CM | POA: Diagnosis not present

## 2020-07-26 DIAGNOSIS — N2581 Secondary hyperparathyroidism of renal origin: Secondary | ICD-10-CM | POA: Diagnosis not present

## 2020-07-26 DIAGNOSIS — N186 End stage renal disease: Secondary | ICD-10-CM | POA: Diagnosis not present

## 2020-07-28 DIAGNOSIS — N2581 Secondary hyperparathyroidism of renal origin: Secondary | ICD-10-CM | POA: Diagnosis not present

## 2020-07-28 DIAGNOSIS — Z992 Dependence on renal dialysis: Secondary | ICD-10-CM | POA: Diagnosis not present

## 2020-07-28 DIAGNOSIS — D631 Anemia in chronic kidney disease: Secondary | ICD-10-CM | POA: Diagnosis not present

## 2020-07-28 DIAGNOSIS — N186 End stage renal disease: Secondary | ICD-10-CM | POA: Diagnosis not present

## 2020-07-31 DIAGNOSIS — N186 End stage renal disease: Secondary | ICD-10-CM | POA: Diagnosis not present

## 2020-07-31 DIAGNOSIS — N2581 Secondary hyperparathyroidism of renal origin: Secondary | ICD-10-CM | POA: Diagnosis not present

## 2020-07-31 DIAGNOSIS — Z992 Dependence on renal dialysis: Secondary | ICD-10-CM | POA: Diagnosis not present

## 2020-07-31 DIAGNOSIS — D631 Anemia in chronic kidney disease: Secondary | ICD-10-CM | POA: Diagnosis not present

## 2020-08-01 DIAGNOSIS — S82891D Other fracture of right lower leg, subsequent encounter for closed fracture with routine healing: Secondary | ICD-10-CM | POA: Diagnosis not present

## 2020-08-01 DIAGNOSIS — D631 Anemia in chronic kidney disease: Secondary | ICD-10-CM | POA: Diagnosis not present

## 2020-08-01 DIAGNOSIS — N186 End stage renal disease: Secondary | ICD-10-CM | POA: Diagnosis not present

## 2020-08-01 DIAGNOSIS — Z992 Dependence on renal dialysis: Secondary | ICD-10-CM | POA: Diagnosis not present

## 2020-08-01 DIAGNOSIS — N2581 Secondary hyperparathyroidism of renal origin: Secondary | ICD-10-CM | POA: Diagnosis not present

## 2020-08-02 DIAGNOSIS — G4733 Obstructive sleep apnea (adult) (pediatric): Secondary | ICD-10-CM | POA: Diagnosis not present

## 2020-08-02 DIAGNOSIS — Z8679 Personal history of other diseases of the circulatory system: Secondary | ICD-10-CM | POA: Diagnosis not present

## 2020-08-02 DIAGNOSIS — I2721 Secondary pulmonary arterial hypertension: Secondary | ICD-10-CM | POA: Diagnosis not present

## 2020-08-02 DIAGNOSIS — Z992 Dependence on renal dialysis: Secondary | ICD-10-CM | POA: Diagnosis not present

## 2020-08-02 DIAGNOSIS — E1122 Type 2 diabetes mellitus with diabetic chronic kidney disease: Secondary | ICD-10-CM | POA: Diagnosis not present

## 2020-08-02 DIAGNOSIS — I132 Hypertensive heart and chronic kidney disease with heart failure and with stage 5 chronic kidney disease, or end stage renal disease: Secondary | ICD-10-CM | POA: Diagnosis not present

## 2020-08-02 DIAGNOSIS — F4321 Adjustment disorder with depressed mood: Secondary | ICD-10-CM | POA: Diagnosis not present

## 2020-08-02 DIAGNOSIS — D509 Iron deficiency anemia, unspecified: Secondary | ICD-10-CM | POA: Diagnosis not present

## 2020-08-02 DIAGNOSIS — N2581 Secondary hyperparathyroidism of renal origin: Secondary | ICD-10-CM | POA: Diagnosis not present

## 2020-08-02 DIAGNOSIS — D689 Coagulation defect, unspecified: Secondary | ICD-10-CM | POA: Diagnosis not present

## 2020-08-02 DIAGNOSIS — N186 End stage renal disease: Secondary | ICD-10-CM | POA: Diagnosis not present

## 2020-08-02 DIAGNOSIS — H543 Unqualified visual loss, both eyes: Secondary | ICD-10-CM | POA: Diagnosis not present

## 2020-08-02 DIAGNOSIS — I120 Hypertensive chronic kidney disease with stage 5 chronic kidney disease or end stage renal disease: Secondary | ICD-10-CM | POA: Diagnosis not present

## 2020-08-02 DIAGNOSIS — Z941 Heart transplant status: Secondary | ICD-10-CM | POA: Diagnosis not present

## 2020-08-02 DIAGNOSIS — M7032 Other bursitis of elbow, left elbow: Secondary | ICD-10-CM | POA: Diagnosis not present

## 2020-08-02 DIAGNOSIS — D849 Immunodeficiency, unspecified: Secondary | ICD-10-CM | POA: Diagnosis not present

## 2020-08-02 DIAGNOSIS — I509 Heart failure, unspecified: Secondary | ICD-10-CM | POA: Diagnosis not present

## 2020-08-02 DIAGNOSIS — Z01818 Encounter for other preprocedural examination: Secondary | ICD-10-CM | POA: Diagnosis not present

## 2020-08-02 DIAGNOSIS — M109 Gout, unspecified: Secondary | ICD-10-CM | POA: Diagnosis not present

## 2020-08-02 DIAGNOSIS — E039 Hypothyroidism, unspecified: Secondary | ICD-10-CM | POA: Diagnosis not present

## 2020-08-04 DIAGNOSIS — N186 End stage renal disease: Secondary | ICD-10-CM | POA: Diagnosis not present

## 2020-08-04 DIAGNOSIS — D631 Anemia in chronic kidney disease: Secondary | ICD-10-CM | POA: Diagnosis not present

## 2020-08-04 DIAGNOSIS — N2581 Secondary hyperparathyroidism of renal origin: Secondary | ICD-10-CM | POA: Diagnosis not present

## 2020-08-04 DIAGNOSIS — Z992 Dependence on renal dialysis: Secondary | ICD-10-CM | POA: Diagnosis not present

## 2020-08-07 DIAGNOSIS — D631 Anemia in chronic kidney disease: Secondary | ICD-10-CM | POA: Diagnosis not present

## 2020-08-07 DIAGNOSIS — N2581 Secondary hyperparathyroidism of renal origin: Secondary | ICD-10-CM | POA: Diagnosis not present

## 2020-08-07 DIAGNOSIS — N186 End stage renal disease: Secondary | ICD-10-CM | POA: Diagnosis not present

## 2020-08-07 DIAGNOSIS — Z992 Dependence on renal dialysis: Secondary | ICD-10-CM | POA: Diagnosis not present

## 2020-08-08 DIAGNOSIS — N2581 Secondary hyperparathyroidism of renal origin: Secondary | ICD-10-CM | POA: Diagnosis not present

## 2020-08-08 DIAGNOSIS — D631 Anemia in chronic kidney disease: Secondary | ICD-10-CM | POA: Diagnosis not present

## 2020-08-08 DIAGNOSIS — N186 End stage renal disease: Secondary | ICD-10-CM | POA: Diagnosis not present

## 2020-08-08 DIAGNOSIS — Z992 Dependence on renal dialysis: Secondary | ICD-10-CM | POA: Diagnosis not present

## 2020-08-09 DIAGNOSIS — Z794 Long term (current) use of insulin: Secondary | ICD-10-CM | POA: Diagnosis not present

## 2020-08-09 DIAGNOSIS — M24522 Contracture, left elbow: Secondary | ICD-10-CM | POA: Diagnosis not present

## 2020-08-09 DIAGNOSIS — I132 Hypertensive heart and chronic kidney disease with heart failure and with stage 5 chronic kidney disease, or end stage renal disease: Secondary | ICD-10-CM | POA: Diagnosis not present

## 2020-08-09 DIAGNOSIS — G4733 Obstructive sleep apnea (adult) (pediatric): Secondary | ICD-10-CM | POA: Diagnosis not present

## 2020-08-09 DIAGNOSIS — Z7952 Long term (current) use of systemic steroids: Secondary | ICD-10-CM | POA: Diagnosis not present

## 2020-08-09 DIAGNOSIS — K219 Gastro-esophageal reflux disease without esophagitis: Secondary | ICD-10-CM | POA: Diagnosis not present

## 2020-08-09 DIAGNOSIS — M109 Gout, unspecified: Secondary | ICD-10-CM | POA: Diagnosis not present

## 2020-08-09 DIAGNOSIS — D689 Coagulation defect, unspecified: Secondary | ICD-10-CM | POA: Diagnosis not present

## 2020-08-09 DIAGNOSIS — Z7982 Long term (current) use of aspirin: Secondary | ICD-10-CM | POA: Diagnosis not present

## 2020-08-09 DIAGNOSIS — M25522 Pain in left elbow: Secondary | ICD-10-CM | POA: Diagnosis not present

## 2020-08-09 DIAGNOSIS — D631 Anemia in chronic kidney disease: Secondary | ICD-10-CM | POA: Diagnosis not present

## 2020-08-09 DIAGNOSIS — I4510 Unspecified right bundle-branch block: Secondary | ICD-10-CM | POA: Diagnosis not present

## 2020-08-09 DIAGNOSIS — Z79899 Other long term (current) drug therapy: Secondary | ICD-10-CM | POA: Diagnosis not present

## 2020-08-09 DIAGNOSIS — Z9989 Dependence on other enabling machines and devices: Secondary | ICD-10-CM | POA: Diagnosis not present

## 2020-08-09 DIAGNOSIS — M12522 Traumatic arthropathy, left elbow: Secondary | ICD-10-CM | POA: Diagnosis not present

## 2020-08-09 DIAGNOSIS — Z992 Dependence on renal dialysis: Secondary | ICD-10-CM | POA: Diagnosis not present

## 2020-08-09 DIAGNOSIS — D509 Iron deficiency anemia, unspecified: Secondary | ICD-10-CM | POA: Diagnosis not present

## 2020-08-09 DIAGNOSIS — G8918 Other acute postprocedural pain: Secondary | ICD-10-CM | POA: Diagnosis not present

## 2020-08-09 DIAGNOSIS — E039 Hypothyroidism, unspecified: Secondary | ICD-10-CM | POA: Diagnosis not present

## 2020-08-09 DIAGNOSIS — M069 Rheumatoid arthritis, unspecified: Secondary | ICD-10-CM | POA: Diagnosis not present

## 2020-08-09 DIAGNOSIS — Z6832 Body mass index (BMI) 32.0-32.9, adult: Secondary | ICD-10-CM | POA: Diagnosis not present

## 2020-08-09 DIAGNOSIS — N186 End stage renal disease: Secondary | ICD-10-CM | POA: Diagnosis not present

## 2020-08-09 DIAGNOSIS — I50810 Right heart failure, unspecified: Secondary | ICD-10-CM | POA: Diagnosis not present

## 2020-08-09 DIAGNOSIS — E1122 Type 2 diabetes mellitus with diabetic chronic kidney disease: Secondary | ICD-10-CM | POA: Diagnosis not present

## 2020-08-09 DIAGNOSIS — M71122 Other infective bursitis, left elbow: Secondary | ICD-10-CM | POA: Diagnosis not present

## 2020-08-09 DIAGNOSIS — D869 Sarcoidosis, unspecified: Secondary | ICD-10-CM | POA: Diagnosis not present

## 2020-08-11 DIAGNOSIS — N186 End stage renal disease: Secondary | ICD-10-CM | POA: Diagnosis not present

## 2020-08-11 DIAGNOSIS — Z992 Dependence on renal dialysis: Secondary | ICD-10-CM | POA: Diagnosis not present

## 2020-08-11 DIAGNOSIS — N2581 Secondary hyperparathyroidism of renal origin: Secondary | ICD-10-CM | POA: Diagnosis not present

## 2020-08-11 DIAGNOSIS — D631 Anemia in chronic kidney disease: Secondary | ICD-10-CM | POA: Diagnosis not present

## 2020-08-14 DIAGNOSIS — N2581 Secondary hyperparathyroidism of renal origin: Secondary | ICD-10-CM | POA: Diagnosis not present

## 2020-08-14 DIAGNOSIS — D631 Anemia in chronic kidney disease: Secondary | ICD-10-CM | POA: Diagnosis not present

## 2020-08-14 DIAGNOSIS — Z992 Dependence on renal dialysis: Secondary | ICD-10-CM | POA: Diagnosis not present

## 2020-08-14 DIAGNOSIS — N186 End stage renal disease: Secondary | ICD-10-CM | POA: Diagnosis not present

## 2020-08-16 DIAGNOSIS — D631 Anemia in chronic kidney disease: Secondary | ICD-10-CM | POA: Diagnosis not present

## 2020-08-16 DIAGNOSIS — Z6833 Body mass index (BMI) 33.0-33.9, adult: Secondary | ICD-10-CM | POA: Diagnosis not present

## 2020-08-16 DIAGNOSIS — M150 Primary generalized (osteo)arthritis: Secondary | ICD-10-CM | POA: Diagnosis not present

## 2020-08-16 DIAGNOSIS — N2581 Secondary hyperparathyroidism of renal origin: Secondary | ICD-10-CM | POA: Diagnosis not present

## 2020-08-16 DIAGNOSIS — I509 Heart failure, unspecified: Secondary | ICD-10-CM | POA: Diagnosis not present

## 2020-08-16 DIAGNOSIS — Z992 Dependence on renal dialysis: Secondary | ICD-10-CM | POA: Diagnosis not present

## 2020-08-16 DIAGNOSIS — M25531 Pain in right wrist: Secondary | ICD-10-CM | POA: Diagnosis not present

## 2020-08-16 DIAGNOSIS — M5489 Other dorsalgia: Secondary | ICD-10-CM | POA: Diagnosis not present

## 2020-08-16 DIAGNOSIS — E669 Obesity, unspecified: Secondary | ICD-10-CM | POA: Diagnosis not present

## 2020-08-16 DIAGNOSIS — M25522 Pain in left elbow: Secondary | ICD-10-CM | POA: Diagnosis not present

## 2020-08-16 DIAGNOSIS — N186 End stage renal disease: Secondary | ICD-10-CM | POA: Diagnosis not present

## 2020-08-16 DIAGNOSIS — M1A09X0 Idiopathic chronic gout, multiple sites, without tophus (tophi): Secondary | ICD-10-CM | POA: Diagnosis not present

## 2020-08-16 DIAGNOSIS — H543 Unqualified visual loss, both eyes: Secondary | ICD-10-CM | POA: Diagnosis not present

## 2020-08-18 DIAGNOSIS — Z992 Dependence on renal dialysis: Secondary | ICD-10-CM | POA: Diagnosis not present

## 2020-08-18 DIAGNOSIS — N186 End stage renal disease: Secondary | ICD-10-CM | POA: Diagnosis not present

## 2020-08-18 DIAGNOSIS — D631 Anemia in chronic kidney disease: Secondary | ICD-10-CM | POA: Diagnosis not present

## 2020-08-18 DIAGNOSIS — N2581 Secondary hyperparathyroidism of renal origin: Secondary | ICD-10-CM | POA: Diagnosis not present

## 2020-08-21 DIAGNOSIS — N186 End stage renal disease: Secondary | ICD-10-CM | POA: Diagnosis not present

## 2020-08-21 DIAGNOSIS — N2581 Secondary hyperparathyroidism of renal origin: Secondary | ICD-10-CM | POA: Diagnosis not present

## 2020-08-21 DIAGNOSIS — D631 Anemia in chronic kidney disease: Secondary | ICD-10-CM | POA: Diagnosis not present

## 2020-08-21 DIAGNOSIS — Z992 Dependence on renal dialysis: Secondary | ICD-10-CM | POA: Diagnosis not present

## 2020-08-22 DIAGNOSIS — H547 Unspecified visual loss: Secondary | ICD-10-CM | POA: Diagnosis not present

## 2020-08-22 DIAGNOSIS — A310 Pulmonary mycobacterial infection: Secondary | ICD-10-CM | POA: Diagnosis not present

## 2020-08-22 DIAGNOSIS — Z992 Dependence on renal dialysis: Secondary | ICD-10-CM | POA: Diagnosis not present

## 2020-08-22 DIAGNOSIS — M71122 Other infective bursitis, left elbow: Secondary | ICD-10-CM | POA: Diagnosis not present

## 2020-08-22 DIAGNOSIS — D849 Immunodeficiency, unspecified: Secondary | ICD-10-CM | POA: Diagnosis not present

## 2020-08-22 DIAGNOSIS — Z87891 Personal history of nicotine dependence: Secondary | ICD-10-CM | POA: Diagnosis not present

## 2020-08-22 DIAGNOSIS — Z941 Heart transplant status: Secondary | ICD-10-CM | POA: Diagnosis not present

## 2020-08-22 DIAGNOSIS — G4733 Obstructive sleep apnea (adult) (pediatric): Secondary | ICD-10-CM | POA: Diagnosis not present

## 2020-08-22 DIAGNOSIS — I272 Pulmonary hypertension, unspecified: Secondary | ICD-10-CM | POA: Diagnosis not present

## 2020-08-22 DIAGNOSIS — I120 Hypertensive chronic kidney disease with stage 5 chronic kidney disease or end stage renal disease: Secondary | ICD-10-CM | POA: Diagnosis not present

## 2020-08-22 DIAGNOSIS — E1122 Type 2 diabetes mellitus with diabetic chronic kidney disease: Secondary | ICD-10-CM | POA: Diagnosis not present

## 2020-08-22 DIAGNOSIS — E039 Hypothyroidism, unspecified: Secondary | ICD-10-CM | POA: Diagnosis not present

## 2020-08-22 DIAGNOSIS — M109 Gout, unspecified: Secondary | ICD-10-CM | POA: Diagnosis not present

## 2020-08-22 DIAGNOSIS — M12522 Traumatic arthropathy, left elbow: Secondary | ICD-10-CM | POA: Diagnosis not present

## 2020-08-22 DIAGNOSIS — E1142 Type 2 diabetes mellitus with diabetic polyneuropathy: Secondary | ICD-10-CM | POA: Diagnosis not present

## 2020-08-22 DIAGNOSIS — N186 End stage renal disease: Secondary | ICD-10-CM | POA: Diagnosis not present

## 2020-08-22 DIAGNOSIS — Z23 Encounter for immunization: Secondary | ICD-10-CM | POA: Diagnosis not present

## 2020-08-23 DIAGNOSIS — Z992 Dependence on renal dialysis: Secondary | ICD-10-CM | POA: Diagnosis not present

## 2020-08-23 DIAGNOSIS — N186 End stage renal disease: Secondary | ICD-10-CM | POA: Diagnosis not present

## 2020-08-23 DIAGNOSIS — D631 Anemia in chronic kidney disease: Secondary | ICD-10-CM | POA: Diagnosis not present

## 2020-08-23 DIAGNOSIS — N2581 Secondary hyperparathyroidism of renal origin: Secondary | ICD-10-CM | POA: Diagnosis not present

## 2020-08-24 DIAGNOSIS — N186 End stage renal disease: Secondary | ICD-10-CM | POA: Diagnosis not present

## 2020-08-24 DIAGNOSIS — T8620 Unspecified complication of heart transplant: Secondary | ICD-10-CM | POA: Diagnosis not present

## 2020-08-24 DIAGNOSIS — Z992 Dependence on renal dialysis: Secondary | ICD-10-CM | POA: Diagnosis not present

## 2020-08-25 DIAGNOSIS — Z79899 Other long term (current) drug therapy: Secondary | ICD-10-CM | POA: Diagnosis not present

## 2020-08-25 DIAGNOSIS — N186 End stage renal disease: Secondary | ICD-10-CM | POA: Diagnosis not present

## 2020-08-25 DIAGNOSIS — D509 Iron deficiency anemia, unspecified: Secondary | ICD-10-CM | POA: Diagnosis not present

## 2020-08-25 DIAGNOSIS — N2581 Secondary hyperparathyroidism of renal origin: Secondary | ICD-10-CM | POA: Diagnosis not present

## 2020-08-25 DIAGNOSIS — D631 Anemia in chronic kidney disease: Secondary | ICD-10-CM | POA: Diagnosis not present

## 2020-08-25 DIAGNOSIS — Z992 Dependence on renal dialysis: Secondary | ICD-10-CM | POA: Diagnosis not present

## 2020-08-28 DIAGNOSIS — Z992 Dependence on renal dialysis: Secondary | ICD-10-CM | POA: Diagnosis not present

## 2020-08-28 DIAGNOSIS — N2581 Secondary hyperparathyroidism of renal origin: Secondary | ICD-10-CM | POA: Diagnosis not present

## 2020-08-28 DIAGNOSIS — N186 End stage renal disease: Secondary | ICD-10-CM | POA: Diagnosis not present

## 2020-08-28 DIAGNOSIS — Z79899 Other long term (current) drug therapy: Secondary | ICD-10-CM | POA: Diagnosis not present

## 2020-08-28 DIAGNOSIS — D631 Anemia in chronic kidney disease: Secondary | ICD-10-CM | POA: Diagnosis not present

## 2020-08-28 DIAGNOSIS — D509 Iron deficiency anemia, unspecified: Secondary | ICD-10-CM | POA: Diagnosis not present

## 2020-08-30 DIAGNOSIS — D631 Anemia in chronic kidney disease: Secondary | ICD-10-CM | POA: Diagnosis not present

## 2020-08-30 DIAGNOSIS — Z79899 Other long term (current) drug therapy: Secondary | ICD-10-CM | POA: Diagnosis not present

## 2020-08-30 DIAGNOSIS — N185 Chronic kidney disease, stage 5: Secondary | ICD-10-CM | POA: Diagnosis not present

## 2020-08-30 DIAGNOSIS — Z992 Dependence on renal dialysis: Secondary | ICD-10-CM | POA: Diagnosis not present

## 2020-08-30 DIAGNOSIS — E782 Mixed hyperlipidemia: Secondary | ICD-10-CM | POA: Diagnosis not present

## 2020-08-30 DIAGNOSIS — E1165 Type 2 diabetes mellitus with hyperglycemia: Secondary | ICD-10-CM | POA: Diagnosis not present

## 2020-08-30 DIAGNOSIS — D509 Iron deficiency anemia, unspecified: Secondary | ICD-10-CM | POA: Diagnosis not present

## 2020-08-30 DIAGNOSIS — I10 Essential (primary) hypertension: Secondary | ICD-10-CM | POA: Diagnosis not present

## 2020-08-30 DIAGNOSIS — Z941 Heart transplant status: Secondary | ICD-10-CM | POA: Diagnosis not present

## 2020-08-30 DIAGNOSIS — E039 Hypothyroidism, unspecified: Secondary | ICD-10-CM | POA: Diagnosis not present

## 2020-08-30 DIAGNOSIS — N186 End stage renal disease: Secondary | ICD-10-CM | POA: Diagnosis not present

## 2020-08-30 DIAGNOSIS — E119 Type 2 diabetes mellitus without complications: Secondary | ICD-10-CM | POA: Diagnosis not present

## 2020-08-30 DIAGNOSIS — N2581 Secondary hyperparathyroidism of renal origin: Secondary | ICD-10-CM | POA: Diagnosis not present

## 2020-08-30 DIAGNOSIS — E118 Type 2 diabetes mellitus with unspecified complications: Secondary | ICD-10-CM | POA: Diagnosis not present

## 2020-09-01 DIAGNOSIS — N186 End stage renal disease: Secondary | ICD-10-CM | POA: Diagnosis not present

## 2020-09-01 DIAGNOSIS — N2581 Secondary hyperparathyroidism of renal origin: Secondary | ICD-10-CM | POA: Diagnosis not present

## 2020-09-01 DIAGNOSIS — Z79899 Other long term (current) drug therapy: Secondary | ICD-10-CM | POA: Diagnosis not present

## 2020-09-01 DIAGNOSIS — Z992 Dependence on renal dialysis: Secondary | ICD-10-CM | POA: Diagnosis not present

## 2020-09-01 DIAGNOSIS — D631 Anemia in chronic kidney disease: Secondary | ICD-10-CM | POA: Diagnosis not present

## 2020-09-01 DIAGNOSIS — D509 Iron deficiency anemia, unspecified: Secondary | ICD-10-CM | POA: Diagnosis not present

## 2020-09-04 DIAGNOSIS — D509 Iron deficiency anemia, unspecified: Secondary | ICD-10-CM | POA: Diagnosis not present

## 2020-09-04 DIAGNOSIS — Z79899 Other long term (current) drug therapy: Secondary | ICD-10-CM | POA: Diagnosis not present

## 2020-09-04 DIAGNOSIS — N2581 Secondary hyperparathyroidism of renal origin: Secondary | ICD-10-CM | POA: Diagnosis not present

## 2020-09-04 DIAGNOSIS — D631 Anemia in chronic kidney disease: Secondary | ICD-10-CM | POA: Diagnosis not present

## 2020-09-04 DIAGNOSIS — N186 End stage renal disease: Secondary | ICD-10-CM | POA: Diagnosis not present

## 2020-09-04 DIAGNOSIS — Z992 Dependence on renal dialysis: Secondary | ICD-10-CM | POA: Diagnosis not present

## 2020-09-06 DIAGNOSIS — D509 Iron deficiency anemia, unspecified: Secondary | ICD-10-CM | POA: Diagnosis not present

## 2020-09-06 DIAGNOSIS — Z992 Dependence on renal dialysis: Secondary | ICD-10-CM | POA: Diagnosis not present

## 2020-09-06 DIAGNOSIS — N2581 Secondary hyperparathyroidism of renal origin: Secondary | ICD-10-CM | POA: Diagnosis not present

## 2020-09-06 DIAGNOSIS — Z79899 Other long term (current) drug therapy: Secondary | ICD-10-CM | POA: Diagnosis not present

## 2020-09-06 DIAGNOSIS — D631 Anemia in chronic kidney disease: Secondary | ICD-10-CM | POA: Diagnosis not present

## 2020-09-06 DIAGNOSIS — N186 End stage renal disease: Secondary | ICD-10-CM | POA: Diagnosis not present

## 2020-09-07 DIAGNOSIS — H53413 Scotoma involving central area, bilateral: Secondary | ICD-10-CM | POA: Diagnosis not present

## 2020-09-07 DIAGNOSIS — H542X11 Low vision right eye category 1, low vision left eye category 1: Secondary | ICD-10-CM | POA: Diagnosis not present

## 2020-09-08 DIAGNOSIS — D631 Anemia in chronic kidney disease: Secondary | ICD-10-CM | POA: Diagnosis not present

## 2020-09-08 DIAGNOSIS — N186 End stage renal disease: Secondary | ICD-10-CM | POA: Diagnosis not present

## 2020-09-08 DIAGNOSIS — Z992 Dependence on renal dialysis: Secondary | ICD-10-CM | POA: Diagnosis not present

## 2020-09-08 DIAGNOSIS — N2581 Secondary hyperparathyroidism of renal origin: Secondary | ICD-10-CM | POA: Diagnosis not present

## 2020-09-08 DIAGNOSIS — Z79899 Other long term (current) drug therapy: Secondary | ICD-10-CM | POA: Diagnosis not present

## 2020-09-08 DIAGNOSIS — D509 Iron deficiency anemia, unspecified: Secondary | ICD-10-CM | POA: Diagnosis not present

## 2020-09-11 DIAGNOSIS — Z79899 Other long term (current) drug therapy: Secondary | ICD-10-CM | POA: Diagnosis not present

## 2020-09-11 DIAGNOSIS — N2581 Secondary hyperparathyroidism of renal origin: Secondary | ICD-10-CM | POA: Diagnosis not present

## 2020-09-11 DIAGNOSIS — D509 Iron deficiency anemia, unspecified: Secondary | ICD-10-CM | POA: Diagnosis not present

## 2020-09-11 DIAGNOSIS — D631 Anemia in chronic kidney disease: Secondary | ICD-10-CM | POA: Diagnosis not present

## 2020-09-11 DIAGNOSIS — N186 End stage renal disease: Secondary | ICD-10-CM | POA: Diagnosis not present

## 2020-09-11 DIAGNOSIS — Z992 Dependence on renal dialysis: Secondary | ICD-10-CM | POA: Diagnosis not present

## 2020-09-12 DIAGNOSIS — S82455A Nondisplaced comminuted fracture of shaft of left fibula, initial encounter for closed fracture: Secondary | ICD-10-CM | POA: Diagnosis not present

## 2020-09-13 DIAGNOSIS — D631 Anemia in chronic kidney disease: Secondary | ICD-10-CM | POA: Diagnosis not present

## 2020-09-13 DIAGNOSIS — Z79899 Other long term (current) drug therapy: Secondary | ICD-10-CM | POA: Diagnosis not present

## 2020-09-13 DIAGNOSIS — N2581 Secondary hyperparathyroidism of renal origin: Secondary | ICD-10-CM | POA: Diagnosis not present

## 2020-09-13 DIAGNOSIS — Z992 Dependence on renal dialysis: Secondary | ICD-10-CM | POA: Diagnosis not present

## 2020-09-13 DIAGNOSIS — D509 Iron deficiency anemia, unspecified: Secondary | ICD-10-CM | POA: Diagnosis not present

## 2020-09-13 DIAGNOSIS — N186 End stage renal disease: Secondary | ICD-10-CM | POA: Diagnosis not present

## 2020-09-14 ENCOUNTER — Telehealth: Payer: Self-pay | Admitting: Pulmonary Disease

## 2020-09-14 NOTE — Telephone Encounter (Signed)
Called and spoke with patient, he stated that during the summer he had the temperature of the air adjusted to be cooler.  He now needs it adjusted to be warmer, the cool air is causing him to have an increase in mucus.  He cannot remember how to adjust it.  I advised him he would need to contact his DME company and they could assist him.  He stated he would call them.  Nothing further needed.

## 2020-09-15 DIAGNOSIS — Z992 Dependence on renal dialysis: Secondary | ICD-10-CM | POA: Diagnosis not present

## 2020-09-15 DIAGNOSIS — N2581 Secondary hyperparathyroidism of renal origin: Secondary | ICD-10-CM | POA: Diagnosis not present

## 2020-09-15 DIAGNOSIS — D509 Iron deficiency anemia, unspecified: Secondary | ICD-10-CM | POA: Diagnosis not present

## 2020-09-15 DIAGNOSIS — D631 Anemia in chronic kidney disease: Secondary | ICD-10-CM | POA: Diagnosis not present

## 2020-09-15 DIAGNOSIS — Z79899 Other long term (current) drug therapy: Secondary | ICD-10-CM | POA: Diagnosis not present

## 2020-09-15 DIAGNOSIS — N186 End stage renal disease: Secondary | ICD-10-CM | POA: Diagnosis not present

## 2020-09-18 DIAGNOSIS — D631 Anemia in chronic kidney disease: Secondary | ICD-10-CM | POA: Diagnosis not present

## 2020-09-18 DIAGNOSIS — N2581 Secondary hyperparathyroidism of renal origin: Secondary | ICD-10-CM | POA: Diagnosis not present

## 2020-09-18 DIAGNOSIS — Z992 Dependence on renal dialysis: Secondary | ICD-10-CM | POA: Diagnosis not present

## 2020-09-18 DIAGNOSIS — Z79899 Other long term (current) drug therapy: Secondary | ICD-10-CM | POA: Diagnosis not present

## 2020-09-18 DIAGNOSIS — N186 End stage renal disease: Secondary | ICD-10-CM | POA: Diagnosis not present

## 2020-09-18 DIAGNOSIS — D509 Iron deficiency anemia, unspecified: Secondary | ICD-10-CM | POA: Diagnosis not present

## 2020-09-20 DIAGNOSIS — D509 Iron deficiency anemia, unspecified: Secondary | ICD-10-CM | POA: Diagnosis not present

## 2020-09-20 DIAGNOSIS — N186 End stage renal disease: Secondary | ICD-10-CM | POA: Diagnosis not present

## 2020-09-20 DIAGNOSIS — D631 Anemia in chronic kidney disease: Secondary | ICD-10-CM | POA: Diagnosis not present

## 2020-09-20 DIAGNOSIS — Z992 Dependence on renal dialysis: Secondary | ICD-10-CM | POA: Diagnosis not present

## 2020-09-20 DIAGNOSIS — Z79899 Other long term (current) drug therapy: Secondary | ICD-10-CM | POA: Diagnosis not present

## 2020-09-20 DIAGNOSIS — N2581 Secondary hyperparathyroidism of renal origin: Secondary | ICD-10-CM | POA: Diagnosis not present

## 2020-09-22 DIAGNOSIS — Z992 Dependence on renal dialysis: Secondary | ICD-10-CM | POA: Diagnosis not present

## 2020-09-22 DIAGNOSIS — N186 End stage renal disease: Secondary | ICD-10-CM | POA: Diagnosis not present

## 2020-09-22 DIAGNOSIS — D631 Anemia in chronic kidney disease: Secondary | ICD-10-CM | POA: Diagnosis not present

## 2020-09-22 DIAGNOSIS — Z79899 Other long term (current) drug therapy: Secondary | ICD-10-CM | POA: Diagnosis not present

## 2020-09-22 DIAGNOSIS — N2581 Secondary hyperparathyroidism of renal origin: Secondary | ICD-10-CM | POA: Diagnosis not present

## 2020-09-22 DIAGNOSIS — D509 Iron deficiency anemia, unspecified: Secondary | ICD-10-CM | POA: Diagnosis not present

## 2020-09-24 DIAGNOSIS — Z992 Dependence on renal dialysis: Secondary | ICD-10-CM | POA: Diagnosis not present

## 2020-09-24 DIAGNOSIS — N186 End stage renal disease: Secondary | ICD-10-CM | POA: Diagnosis not present

## 2020-09-24 DIAGNOSIS — T8620 Unspecified complication of heart transplant: Secondary | ICD-10-CM | POA: Diagnosis not present

## 2020-09-25 DIAGNOSIS — Z992 Dependence on renal dialysis: Secondary | ICD-10-CM | POA: Diagnosis not present

## 2020-09-25 DIAGNOSIS — D631 Anemia in chronic kidney disease: Secondary | ICD-10-CM | POA: Diagnosis not present

## 2020-09-25 DIAGNOSIS — N186 End stage renal disease: Secondary | ICD-10-CM | POA: Diagnosis not present

## 2020-09-25 DIAGNOSIS — D509 Iron deficiency anemia, unspecified: Secondary | ICD-10-CM | POA: Diagnosis not present

## 2020-09-25 DIAGNOSIS — N2581 Secondary hyperparathyroidism of renal origin: Secondary | ICD-10-CM | POA: Diagnosis not present

## 2020-09-27 DIAGNOSIS — D631 Anemia in chronic kidney disease: Secondary | ICD-10-CM | POA: Diagnosis not present

## 2020-09-27 DIAGNOSIS — D509 Iron deficiency anemia, unspecified: Secondary | ICD-10-CM | POA: Diagnosis not present

## 2020-09-27 DIAGNOSIS — N2581 Secondary hyperparathyroidism of renal origin: Secondary | ICD-10-CM | POA: Diagnosis not present

## 2020-09-27 DIAGNOSIS — Z992 Dependence on renal dialysis: Secondary | ICD-10-CM | POA: Diagnosis not present

## 2020-09-27 DIAGNOSIS — N186 End stage renal disease: Secondary | ICD-10-CM | POA: Diagnosis not present

## 2020-09-29 DIAGNOSIS — N186 End stage renal disease: Secondary | ICD-10-CM | POA: Diagnosis not present

## 2020-09-29 DIAGNOSIS — D509 Iron deficiency anemia, unspecified: Secondary | ICD-10-CM | POA: Diagnosis not present

## 2020-09-29 DIAGNOSIS — N2581 Secondary hyperparathyroidism of renal origin: Secondary | ICD-10-CM | POA: Diagnosis not present

## 2020-09-29 DIAGNOSIS — D631 Anemia in chronic kidney disease: Secondary | ICD-10-CM | POA: Diagnosis not present

## 2020-09-29 DIAGNOSIS — Z992 Dependence on renal dialysis: Secondary | ICD-10-CM | POA: Diagnosis not present

## 2020-10-02 DIAGNOSIS — D509 Iron deficiency anemia, unspecified: Secondary | ICD-10-CM | POA: Diagnosis not present

## 2020-10-02 DIAGNOSIS — N2581 Secondary hyperparathyroidism of renal origin: Secondary | ICD-10-CM | POA: Diagnosis not present

## 2020-10-02 DIAGNOSIS — Z992 Dependence on renal dialysis: Secondary | ICD-10-CM | POA: Diagnosis not present

## 2020-10-02 DIAGNOSIS — N186 End stage renal disease: Secondary | ICD-10-CM | POA: Diagnosis not present

## 2020-10-02 DIAGNOSIS — D631 Anemia in chronic kidney disease: Secondary | ICD-10-CM | POA: Diagnosis not present

## 2020-10-04 DIAGNOSIS — N186 End stage renal disease: Secondary | ICD-10-CM | POA: Diagnosis not present

## 2020-10-04 DIAGNOSIS — D509 Iron deficiency anemia, unspecified: Secondary | ICD-10-CM | POA: Diagnosis not present

## 2020-10-04 DIAGNOSIS — D631 Anemia in chronic kidney disease: Secondary | ICD-10-CM | POA: Diagnosis not present

## 2020-10-04 DIAGNOSIS — Z992 Dependence on renal dialysis: Secondary | ICD-10-CM | POA: Diagnosis not present

## 2020-10-04 DIAGNOSIS — N2581 Secondary hyperparathyroidism of renal origin: Secondary | ICD-10-CM | POA: Diagnosis not present

## 2020-10-06 DIAGNOSIS — D509 Iron deficiency anemia, unspecified: Secondary | ICD-10-CM | POA: Diagnosis not present

## 2020-10-06 DIAGNOSIS — D631 Anemia in chronic kidney disease: Secondary | ICD-10-CM | POA: Diagnosis not present

## 2020-10-06 DIAGNOSIS — N2581 Secondary hyperparathyroidism of renal origin: Secondary | ICD-10-CM | POA: Diagnosis not present

## 2020-10-06 DIAGNOSIS — Z992 Dependence on renal dialysis: Secondary | ICD-10-CM | POA: Diagnosis not present

## 2020-10-06 DIAGNOSIS — N186 End stage renal disease: Secondary | ICD-10-CM | POA: Diagnosis not present

## 2020-10-09 DIAGNOSIS — D631 Anemia in chronic kidney disease: Secondary | ICD-10-CM | POA: Diagnosis not present

## 2020-10-09 DIAGNOSIS — D509 Iron deficiency anemia, unspecified: Secondary | ICD-10-CM | POA: Diagnosis not present

## 2020-10-09 DIAGNOSIS — N186 End stage renal disease: Secondary | ICD-10-CM | POA: Diagnosis not present

## 2020-10-09 DIAGNOSIS — N2581 Secondary hyperparathyroidism of renal origin: Secondary | ICD-10-CM | POA: Diagnosis not present

## 2020-10-09 DIAGNOSIS — Z992 Dependence on renal dialysis: Secondary | ICD-10-CM | POA: Diagnosis not present

## 2020-10-11 DIAGNOSIS — D509 Iron deficiency anemia, unspecified: Secondary | ICD-10-CM | POA: Diagnosis not present

## 2020-10-11 DIAGNOSIS — Z0000 Encounter for general adult medical examination without abnormal findings: Secondary | ICD-10-CM | POA: Diagnosis not present

## 2020-10-11 DIAGNOSIS — N2581 Secondary hyperparathyroidism of renal origin: Secondary | ICD-10-CM | POA: Diagnosis not present

## 2020-10-11 DIAGNOSIS — D631 Anemia in chronic kidney disease: Secondary | ICD-10-CM | POA: Diagnosis not present

## 2020-10-11 DIAGNOSIS — Z992 Dependence on renal dialysis: Secondary | ICD-10-CM | POA: Diagnosis not present

## 2020-10-11 DIAGNOSIS — N186 End stage renal disease: Secondary | ICD-10-CM | POA: Diagnosis not present

## 2020-10-13 DIAGNOSIS — N2581 Secondary hyperparathyroidism of renal origin: Secondary | ICD-10-CM | POA: Diagnosis not present

## 2020-10-13 DIAGNOSIS — Z992 Dependence on renal dialysis: Secondary | ICD-10-CM | POA: Diagnosis not present

## 2020-10-13 DIAGNOSIS — N186 End stage renal disease: Secondary | ICD-10-CM | POA: Diagnosis not present

## 2020-10-13 DIAGNOSIS — D509 Iron deficiency anemia, unspecified: Secondary | ICD-10-CM | POA: Diagnosis not present

## 2020-10-13 DIAGNOSIS — D631 Anemia in chronic kidney disease: Secondary | ICD-10-CM | POA: Diagnosis not present

## 2020-10-15 DIAGNOSIS — N2581 Secondary hyperparathyroidism of renal origin: Secondary | ICD-10-CM | POA: Diagnosis not present

## 2020-10-15 DIAGNOSIS — D509 Iron deficiency anemia, unspecified: Secondary | ICD-10-CM | POA: Diagnosis not present

## 2020-10-15 DIAGNOSIS — Z992 Dependence on renal dialysis: Secondary | ICD-10-CM | POA: Diagnosis not present

## 2020-10-15 DIAGNOSIS — N186 End stage renal disease: Secondary | ICD-10-CM | POA: Diagnosis not present

## 2020-10-15 DIAGNOSIS — D631 Anemia in chronic kidney disease: Secondary | ICD-10-CM | POA: Diagnosis not present

## 2020-10-17 DIAGNOSIS — D509 Iron deficiency anemia, unspecified: Secondary | ICD-10-CM | POA: Diagnosis not present

## 2020-10-17 DIAGNOSIS — D631 Anemia in chronic kidney disease: Secondary | ICD-10-CM | POA: Diagnosis not present

## 2020-10-17 DIAGNOSIS — N186 End stage renal disease: Secondary | ICD-10-CM | POA: Diagnosis not present

## 2020-10-17 DIAGNOSIS — Z992 Dependence on renal dialysis: Secondary | ICD-10-CM | POA: Diagnosis not present

## 2020-10-17 DIAGNOSIS — N2581 Secondary hyperparathyroidism of renal origin: Secondary | ICD-10-CM | POA: Diagnosis not present

## 2020-10-20 DIAGNOSIS — Z992 Dependence on renal dialysis: Secondary | ICD-10-CM | POA: Diagnosis not present

## 2020-10-20 DIAGNOSIS — N186 End stage renal disease: Secondary | ICD-10-CM | POA: Diagnosis not present

## 2020-10-20 DIAGNOSIS — N2581 Secondary hyperparathyroidism of renal origin: Secondary | ICD-10-CM | POA: Diagnosis not present

## 2020-10-20 DIAGNOSIS — D631 Anemia in chronic kidney disease: Secondary | ICD-10-CM | POA: Diagnosis not present

## 2020-10-20 DIAGNOSIS — D509 Iron deficiency anemia, unspecified: Secondary | ICD-10-CM | POA: Diagnosis not present

## 2020-10-23 DIAGNOSIS — N2581 Secondary hyperparathyroidism of renal origin: Secondary | ICD-10-CM | POA: Diagnosis not present

## 2020-10-23 DIAGNOSIS — N186 End stage renal disease: Secondary | ICD-10-CM | POA: Diagnosis not present

## 2020-10-23 DIAGNOSIS — Z992 Dependence on renal dialysis: Secondary | ICD-10-CM | POA: Diagnosis not present

## 2020-10-23 DIAGNOSIS — D509 Iron deficiency anemia, unspecified: Secondary | ICD-10-CM | POA: Diagnosis not present

## 2020-10-23 DIAGNOSIS — D631 Anemia in chronic kidney disease: Secondary | ICD-10-CM | POA: Diagnosis not present

## 2020-10-24 DIAGNOSIS — Z941 Heart transplant status: Secondary | ICD-10-CM | POA: Diagnosis not present

## 2020-10-24 DIAGNOSIS — E1122 Type 2 diabetes mellitus with diabetic chronic kidney disease: Secondary | ICD-10-CM | POA: Diagnosis not present

## 2020-10-24 DIAGNOSIS — Z992 Dependence on renal dialysis: Secondary | ICD-10-CM | POA: Diagnosis not present

## 2020-10-24 DIAGNOSIS — Z792 Long term (current) use of antibiotics: Secondary | ICD-10-CM | POA: Diagnosis not present

## 2020-10-24 DIAGNOSIS — A310 Pulmonary mycobacterial infection: Secondary | ICD-10-CM | POA: Diagnosis not present

## 2020-10-24 DIAGNOSIS — Z23 Encounter for immunization: Secondary | ICD-10-CM | POA: Diagnosis not present

## 2020-10-24 DIAGNOSIS — N2581 Secondary hyperparathyroidism of renal origin: Secondary | ICD-10-CM | POA: Diagnosis not present

## 2020-10-24 DIAGNOSIS — D631 Anemia in chronic kidney disease: Secondary | ICD-10-CM | POA: Diagnosis not present

## 2020-10-24 DIAGNOSIS — M71122 Other infective bursitis, left elbow: Secondary | ICD-10-CM | POA: Diagnosis not present

## 2020-10-24 DIAGNOSIS — D696 Thrombocytopenia, unspecified: Secondary | ICD-10-CM | POA: Diagnosis not present

## 2020-10-24 DIAGNOSIS — D84821 Immunodeficiency due to drugs: Secondary | ICD-10-CM | POA: Diagnosis not present

## 2020-10-24 DIAGNOSIS — E119 Type 2 diabetes mellitus without complications: Secondary | ICD-10-CM | POA: Diagnosis not present

## 2020-10-24 DIAGNOSIS — D849 Immunodeficiency, unspecified: Secondary | ICD-10-CM | POA: Diagnosis not present

## 2020-10-24 DIAGNOSIS — N186 End stage renal disease: Secondary | ICD-10-CM | POA: Diagnosis not present

## 2020-10-24 DIAGNOSIS — T8620 Unspecified complication of heart transplant: Secondary | ICD-10-CM | POA: Diagnosis not present

## 2020-10-25 DIAGNOSIS — M71122 Other infective bursitis, left elbow: Secondary | ICD-10-CM | POA: Diagnosis not present

## 2020-10-25 DIAGNOSIS — D631 Anemia in chronic kidney disease: Secondary | ICD-10-CM | POA: Diagnosis not present

## 2020-10-25 DIAGNOSIS — Z992 Dependence on renal dialysis: Secondary | ICD-10-CM | POA: Diagnosis not present

## 2020-10-25 DIAGNOSIS — D696 Thrombocytopenia, unspecified: Secondary | ICD-10-CM | POA: Diagnosis not present

## 2020-10-25 DIAGNOSIS — N186 End stage renal disease: Secondary | ICD-10-CM | POA: Diagnosis not present

## 2020-10-25 DIAGNOSIS — N2581 Secondary hyperparathyroidism of renal origin: Secondary | ICD-10-CM | POA: Diagnosis not present

## 2020-10-26 ENCOUNTER — Encounter: Payer: Self-pay | Admitting: Neurology

## 2020-10-27 DIAGNOSIS — N186 End stage renal disease: Secondary | ICD-10-CM | POA: Diagnosis not present

## 2020-10-27 DIAGNOSIS — M71122 Other infective bursitis, left elbow: Secondary | ICD-10-CM | POA: Diagnosis not present

## 2020-10-27 DIAGNOSIS — D696 Thrombocytopenia, unspecified: Secondary | ICD-10-CM | POA: Diagnosis not present

## 2020-10-27 DIAGNOSIS — Z992 Dependence on renal dialysis: Secondary | ICD-10-CM | POA: Diagnosis not present

## 2020-10-27 DIAGNOSIS — D631 Anemia in chronic kidney disease: Secondary | ICD-10-CM | POA: Diagnosis not present

## 2020-10-27 DIAGNOSIS — N2581 Secondary hyperparathyroidism of renal origin: Secondary | ICD-10-CM | POA: Diagnosis not present

## 2020-10-30 DIAGNOSIS — Z992 Dependence on renal dialysis: Secondary | ICD-10-CM | POA: Diagnosis not present

## 2020-10-30 DIAGNOSIS — M71122 Other infective bursitis, left elbow: Secondary | ICD-10-CM | POA: Diagnosis not present

## 2020-10-30 DIAGNOSIS — N186 End stage renal disease: Secondary | ICD-10-CM | POA: Diagnosis not present

## 2020-10-30 DIAGNOSIS — D631 Anemia in chronic kidney disease: Secondary | ICD-10-CM | POA: Diagnosis not present

## 2020-10-30 DIAGNOSIS — D696 Thrombocytopenia, unspecified: Secondary | ICD-10-CM | POA: Diagnosis not present

## 2020-10-30 DIAGNOSIS — N2581 Secondary hyperparathyroidism of renal origin: Secondary | ICD-10-CM | POA: Diagnosis not present

## 2020-11-01 DIAGNOSIS — N186 End stage renal disease: Secondary | ICD-10-CM | POA: Diagnosis not present

## 2020-11-01 DIAGNOSIS — Z992 Dependence on renal dialysis: Secondary | ICD-10-CM | POA: Diagnosis not present

## 2020-11-01 DIAGNOSIS — D696 Thrombocytopenia, unspecified: Secondary | ICD-10-CM | POA: Diagnosis not present

## 2020-11-01 DIAGNOSIS — D631 Anemia in chronic kidney disease: Secondary | ICD-10-CM | POA: Diagnosis not present

## 2020-11-01 DIAGNOSIS — M71122 Other infective bursitis, left elbow: Secondary | ICD-10-CM | POA: Diagnosis not present

## 2020-11-01 DIAGNOSIS — N2581 Secondary hyperparathyroidism of renal origin: Secondary | ICD-10-CM | POA: Diagnosis not present

## 2020-11-03 DIAGNOSIS — D631 Anemia in chronic kidney disease: Secondary | ICD-10-CM | POA: Diagnosis not present

## 2020-11-03 DIAGNOSIS — N2581 Secondary hyperparathyroidism of renal origin: Secondary | ICD-10-CM | POA: Diagnosis not present

## 2020-11-03 DIAGNOSIS — N186 End stage renal disease: Secondary | ICD-10-CM | POA: Diagnosis not present

## 2020-11-03 DIAGNOSIS — D696 Thrombocytopenia, unspecified: Secondary | ICD-10-CM | POA: Diagnosis not present

## 2020-11-03 DIAGNOSIS — M71122 Other infective bursitis, left elbow: Secondary | ICD-10-CM | POA: Diagnosis not present

## 2020-11-03 DIAGNOSIS — Z992 Dependence on renal dialysis: Secondary | ICD-10-CM | POA: Diagnosis not present

## 2020-11-05 DIAGNOSIS — H542X11 Low vision right eye category 1, low vision left eye category 1: Secondary | ICD-10-CM | POA: Diagnosis not present

## 2020-11-05 DIAGNOSIS — H25813 Combined forms of age-related cataract, bilateral: Secondary | ICD-10-CM | POA: Diagnosis not present

## 2020-11-05 DIAGNOSIS — H53413 Scotoma involving central area, bilateral: Secondary | ICD-10-CM | POA: Diagnosis not present

## 2020-11-05 DIAGNOSIS — H463 Toxic optic neuropathy: Secondary | ICD-10-CM | POA: Diagnosis not present

## 2020-11-06 DIAGNOSIS — N2581 Secondary hyperparathyroidism of renal origin: Secondary | ICD-10-CM | POA: Diagnosis not present

## 2020-11-06 DIAGNOSIS — Z992 Dependence on renal dialysis: Secondary | ICD-10-CM | POA: Diagnosis not present

## 2020-11-06 DIAGNOSIS — D696 Thrombocytopenia, unspecified: Secondary | ICD-10-CM | POA: Diagnosis not present

## 2020-11-06 DIAGNOSIS — N186 End stage renal disease: Secondary | ICD-10-CM | POA: Diagnosis not present

## 2020-11-06 DIAGNOSIS — M71122 Other infective bursitis, left elbow: Secondary | ICD-10-CM | POA: Diagnosis not present

## 2020-11-06 DIAGNOSIS — D631 Anemia in chronic kidney disease: Secondary | ICD-10-CM | POA: Diagnosis not present

## 2020-11-08 DIAGNOSIS — M71122 Other infective bursitis, left elbow: Secondary | ICD-10-CM | POA: Diagnosis not present

## 2020-11-08 DIAGNOSIS — D631 Anemia in chronic kidney disease: Secondary | ICD-10-CM | POA: Diagnosis not present

## 2020-11-08 DIAGNOSIS — Z992 Dependence on renal dialysis: Secondary | ICD-10-CM | POA: Diagnosis not present

## 2020-11-08 DIAGNOSIS — D696 Thrombocytopenia, unspecified: Secondary | ICD-10-CM | POA: Diagnosis not present

## 2020-11-08 DIAGNOSIS — N2581 Secondary hyperparathyroidism of renal origin: Secondary | ICD-10-CM | POA: Diagnosis not present

## 2020-11-08 DIAGNOSIS — N186 End stage renal disease: Secondary | ICD-10-CM | POA: Diagnosis not present

## 2020-11-10 DIAGNOSIS — N2581 Secondary hyperparathyroidism of renal origin: Secondary | ICD-10-CM | POA: Diagnosis not present

## 2020-11-10 DIAGNOSIS — N186 End stage renal disease: Secondary | ICD-10-CM | POA: Diagnosis not present

## 2020-11-10 DIAGNOSIS — Z992 Dependence on renal dialysis: Secondary | ICD-10-CM | POA: Diagnosis not present

## 2020-11-10 DIAGNOSIS — D631 Anemia in chronic kidney disease: Secondary | ICD-10-CM | POA: Diagnosis not present

## 2020-11-10 DIAGNOSIS — M71122 Other infective bursitis, left elbow: Secondary | ICD-10-CM | POA: Diagnosis not present

## 2020-11-10 DIAGNOSIS — D696 Thrombocytopenia, unspecified: Secondary | ICD-10-CM | POA: Diagnosis not present

## 2020-11-13 DIAGNOSIS — N186 End stage renal disease: Secondary | ICD-10-CM | POA: Diagnosis not present

## 2020-11-13 DIAGNOSIS — M71122 Other infective bursitis, left elbow: Secondary | ICD-10-CM | POA: Diagnosis not present

## 2020-11-13 DIAGNOSIS — N2581 Secondary hyperparathyroidism of renal origin: Secondary | ICD-10-CM | POA: Diagnosis not present

## 2020-11-13 DIAGNOSIS — D696 Thrombocytopenia, unspecified: Secondary | ICD-10-CM | POA: Diagnosis not present

## 2020-11-13 DIAGNOSIS — Z992 Dependence on renal dialysis: Secondary | ICD-10-CM | POA: Diagnosis not present

## 2020-11-13 DIAGNOSIS — D631 Anemia in chronic kidney disease: Secondary | ICD-10-CM | POA: Diagnosis not present

## 2020-11-15 DIAGNOSIS — N2581 Secondary hyperparathyroidism of renal origin: Secondary | ICD-10-CM | POA: Diagnosis not present

## 2020-11-15 DIAGNOSIS — D696 Thrombocytopenia, unspecified: Secondary | ICD-10-CM | POA: Diagnosis not present

## 2020-11-15 DIAGNOSIS — N186 End stage renal disease: Secondary | ICD-10-CM | POA: Diagnosis not present

## 2020-11-15 DIAGNOSIS — D631 Anemia in chronic kidney disease: Secondary | ICD-10-CM | POA: Diagnosis not present

## 2020-11-15 DIAGNOSIS — Z992 Dependence on renal dialysis: Secondary | ICD-10-CM | POA: Diagnosis not present

## 2020-11-15 DIAGNOSIS — M71122 Other infective bursitis, left elbow: Secondary | ICD-10-CM | POA: Diagnosis not present

## 2020-11-18 DIAGNOSIS — Z992 Dependence on renal dialysis: Secondary | ICD-10-CM | POA: Diagnosis not present

## 2020-11-18 DIAGNOSIS — N186 End stage renal disease: Secondary | ICD-10-CM | POA: Diagnosis not present

## 2020-11-18 DIAGNOSIS — M71122 Other infective bursitis, left elbow: Secondary | ICD-10-CM | POA: Diagnosis not present

## 2020-11-18 DIAGNOSIS — D696 Thrombocytopenia, unspecified: Secondary | ICD-10-CM | POA: Diagnosis not present

## 2020-11-18 DIAGNOSIS — N2581 Secondary hyperparathyroidism of renal origin: Secondary | ICD-10-CM | POA: Diagnosis not present

## 2020-11-18 DIAGNOSIS — D631 Anemia in chronic kidney disease: Secondary | ICD-10-CM | POA: Diagnosis not present

## 2020-11-20 DIAGNOSIS — D631 Anemia in chronic kidney disease: Secondary | ICD-10-CM | POA: Diagnosis not present

## 2020-11-20 DIAGNOSIS — N186 End stage renal disease: Secondary | ICD-10-CM | POA: Diagnosis not present

## 2020-11-20 DIAGNOSIS — Z992 Dependence on renal dialysis: Secondary | ICD-10-CM | POA: Diagnosis not present

## 2020-11-20 DIAGNOSIS — N2581 Secondary hyperparathyroidism of renal origin: Secondary | ICD-10-CM | POA: Diagnosis not present

## 2020-11-20 DIAGNOSIS — M71122 Other infective bursitis, left elbow: Secondary | ICD-10-CM | POA: Diagnosis not present

## 2020-11-20 DIAGNOSIS — D696 Thrombocytopenia, unspecified: Secondary | ICD-10-CM | POA: Diagnosis not present

## 2020-11-22 DIAGNOSIS — M71122 Other infective bursitis, left elbow: Secondary | ICD-10-CM | POA: Diagnosis not present

## 2020-11-22 DIAGNOSIS — N2581 Secondary hyperparathyroidism of renal origin: Secondary | ICD-10-CM | POA: Diagnosis not present

## 2020-11-22 DIAGNOSIS — D696 Thrombocytopenia, unspecified: Secondary | ICD-10-CM | POA: Diagnosis not present

## 2020-11-22 DIAGNOSIS — D631 Anemia in chronic kidney disease: Secondary | ICD-10-CM | POA: Diagnosis not present

## 2020-11-22 DIAGNOSIS — N186 End stage renal disease: Secondary | ICD-10-CM | POA: Diagnosis not present

## 2020-11-22 DIAGNOSIS — Z992 Dependence on renal dialysis: Secondary | ICD-10-CM | POA: Diagnosis not present

## 2020-11-24 DIAGNOSIS — T8620 Unspecified complication of heart transplant: Secondary | ICD-10-CM | POA: Diagnosis not present

## 2020-11-24 DIAGNOSIS — N186 End stage renal disease: Secondary | ICD-10-CM | POA: Diagnosis not present

## 2020-11-24 DIAGNOSIS — Z992 Dependence on renal dialysis: Secondary | ICD-10-CM | POA: Diagnosis not present

## 2020-11-25 DIAGNOSIS — N2581 Secondary hyperparathyroidism of renal origin: Secondary | ICD-10-CM | POA: Diagnosis not present

## 2020-11-25 DIAGNOSIS — N186 End stage renal disease: Secondary | ICD-10-CM | POA: Diagnosis not present

## 2020-11-25 DIAGNOSIS — D509 Iron deficiency anemia, unspecified: Secondary | ICD-10-CM | POA: Diagnosis not present

## 2020-11-25 DIAGNOSIS — Z992 Dependence on renal dialysis: Secondary | ICD-10-CM | POA: Diagnosis not present

## 2020-11-25 DIAGNOSIS — D631 Anemia in chronic kidney disease: Secondary | ICD-10-CM | POA: Diagnosis not present

## 2020-11-27 DIAGNOSIS — D509 Iron deficiency anemia, unspecified: Secondary | ICD-10-CM | POA: Diagnosis not present

## 2020-11-27 DIAGNOSIS — N2581 Secondary hyperparathyroidism of renal origin: Secondary | ICD-10-CM | POA: Diagnosis not present

## 2020-11-27 DIAGNOSIS — D631 Anemia in chronic kidney disease: Secondary | ICD-10-CM | POA: Diagnosis not present

## 2020-11-27 DIAGNOSIS — Z992 Dependence on renal dialysis: Secondary | ICD-10-CM | POA: Diagnosis not present

## 2020-11-27 DIAGNOSIS — N186 End stage renal disease: Secondary | ICD-10-CM | POA: Diagnosis not present

## 2020-11-29 DIAGNOSIS — N2581 Secondary hyperparathyroidism of renal origin: Secondary | ICD-10-CM | POA: Diagnosis not present

## 2020-11-29 DIAGNOSIS — N186 End stage renal disease: Secondary | ICD-10-CM | POA: Diagnosis not present

## 2020-11-29 DIAGNOSIS — E119 Type 2 diabetes mellitus without complications: Secondary | ICD-10-CM | POA: Diagnosis not present

## 2020-11-29 DIAGNOSIS — Z992 Dependence on renal dialysis: Secondary | ICD-10-CM | POA: Diagnosis not present

## 2020-11-29 DIAGNOSIS — D509 Iron deficiency anemia, unspecified: Secondary | ICD-10-CM | POA: Diagnosis not present

## 2020-11-29 DIAGNOSIS — D631 Anemia in chronic kidney disease: Secondary | ICD-10-CM | POA: Diagnosis not present

## 2020-12-01 DIAGNOSIS — D509 Iron deficiency anemia, unspecified: Secondary | ICD-10-CM | POA: Diagnosis not present

## 2020-12-01 DIAGNOSIS — N186 End stage renal disease: Secondary | ICD-10-CM | POA: Diagnosis not present

## 2020-12-01 DIAGNOSIS — D631 Anemia in chronic kidney disease: Secondary | ICD-10-CM | POA: Diagnosis not present

## 2020-12-01 DIAGNOSIS — N2581 Secondary hyperparathyroidism of renal origin: Secondary | ICD-10-CM | POA: Diagnosis not present

## 2020-12-01 DIAGNOSIS — Z992 Dependence on renal dialysis: Secondary | ICD-10-CM | POA: Diagnosis not present

## 2020-12-03 ENCOUNTER — Ambulatory Visit: Payer: Medicare Other | Admitting: Pulmonary Disease

## 2020-12-04 DIAGNOSIS — D631 Anemia in chronic kidney disease: Secondary | ICD-10-CM | POA: Diagnosis not present

## 2020-12-04 DIAGNOSIS — Z992 Dependence on renal dialysis: Secondary | ICD-10-CM | POA: Diagnosis not present

## 2020-12-04 DIAGNOSIS — N186 End stage renal disease: Secondary | ICD-10-CM | POA: Diagnosis not present

## 2020-12-04 DIAGNOSIS — N2581 Secondary hyperparathyroidism of renal origin: Secondary | ICD-10-CM | POA: Diagnosis not present

## 2020-12-04 DIAGNOSIS — D509 Iron deficiency anemia, unspecified: Secondary | ICD-10-CM | POA: Diagnosis not present

## 2020-12-05 DIAGNOSIS — D696 Thrombocytopenia, unspecified: Secondary | ICD-10-CM | POA: Diagnosis not present

## 2020-12-05 DIAGNOSIS — N5089 Other specified disorders of the male genital organs: Secondary | ICD-10-CM | POA: Diagnosis not present

## 2020-12-05 DIAGNOSIS — G629 Polyneuropathy, unspecified: Secondary | ICD-10-CM | POA: Diagnosis not present

## 2020-12-05 DIAGNOSIS — N185 Chronic kidney disease, stage 5: Secondary | ICD-10-CM | POA: Diagnosis not present

## 2020-12-06 DIAGNOSIS — N2581 Secondary hyperparathyroidism of renal origin: Secondary | ICD-10-CM | POA: Diagnosis not present

## 2020-12-06 DIAGNOSIS — N186 End stage renal disease: Secondary | ICD-10-CM | POA: Diagnosis not present

## 2020-12-06 DIAGNOSIS — D631 Anemia in chronic kidney disease: Secondary | ICD-10-CM | POA: Diagnosis not present

## 2020-12-06 DIAGNOSIS — Z992 Dependence on renal dialysis: Secondary | ICD-10-CM | POA: Diagnosis not present

## 2020-12-06 DIAGNOSIS — D509 Iron deficiency anemia, unspecified: Secondary | ICD-10-CM | POA: Diagnosis not present

## 2020-12-07 ENCOUNTER — Encounter: Payer: Self-pay | Admitting: Pulmonary Disease

## 2020-12-07 ENCOUNTER — Ambulatory Visit (INDEPENDENT_AMBULATORY_CARE_PROVIDER_SITE_OTHER): Payer: Medicare Other | Admitting: Pulmonary Disease

## 2020-12-07 ENCOUNTER — Other Ambulatory Visit: Payer: Self-pay

## 2020-12-07 VITALS — BP 112/72 | HR 87 | Temp 98.0°F | Ht 64.0 in | Wt 191.0 lb

## 2020-12-07 DIAGNOSIS — G4733 Obstructive sleep apnea (adult) (pediatric): Secondary | ICD-10-CM

## 2020-12-07 DIAGNOSIS — Z0000 Encounter for general adult medical examination without abnormal findings: Secondary | ICD-10-CM

## 2020-12-07 NOTE — Progress Notes (Signed)
@Patient  ID: David Gregory, male    DOB: [birth year], 55 y.o.   MRN: [id number]  Chief Complaint  Patient presents with  . Follow-up    CPAP used nightly     Referring provider: Vicenta Aly, FNP  HPI:  55 year old male former smoker followed in our office for obstructive sleep apnea  PMH: Gout, heart failure, edema, morbid obesity, chronic kidney disease Smoker/ Smoking History: None former smoker Maintenance: None Pt of: Dr. Halford Chessman  12/07/2020  - Visit   56 year old male former smoker followed in our office for obstructive sleep apnea.  He is a patient of Dr. Halford Chessman.  Last seen in November/2020.  At that time it was recommended that he follow-up in 1 year and continue to utilize his CPAP at a set pressure of 15.  Patient reports compliance to CPAP therapy.  Unfortunately we do not have a compliance download to document this.  Patient reports that he has an SD card at home.  He is willing to bring this by our office.  He reports no issues with utilizing his CPAP.  He reports he uses it every night.  Patient is up-to-date with his COVID-19 vaccinations as well as seasonal flu vaccinations.   Questionaires / Pulmonary Flowsheets:   ACT:  No flowsheet data found.  MMRC: No flowsheet data found.  Epworth:  No flowsheet data found.  Tests:   Pulmonary tests:  Spirometry 12/22/13 >> FEV1 1.39 (51%), FEV1% 83 Spirometry 03/02/15 >> FEV1 1.67 (59%), FEV1% 83  Sleep tests:  PSG 04/27/12 >> AHI 139.5, SpO2 low 61%. CPAP 17 cm H2O. PLMI 0  ONO with CPAP and RA 07/07/12>>Test time 4 hr 59 min. Mean SpO2 91%, low SpO2 83%. Spent 44 min with SpO2 < 89%  08/10/12 Change to CPAP 15 cm H2O CPAP 10/26/15 to 11/24/15 >> used on 29 of 30 nights with average 8 hrs and 13 min.  Average AHI is 1.8 with CPAP 15 cm H2O.  Cardiac tests:  Echo 04/03/16 >> mild LVH, EF 55%  FENO:  No results found for: NITRICOXIDE  PFT: PFT Results Latest Ref Rng & Units 12/22/2013  FVC-Pre L 1.69   FVC-Predicted Pre % 50  Pre FEV1/FVC % % 83  FEV1-Pre L 1.39  FEV1-Predicted Pre % 51    WALK:  No flowsheet data found.  Imaging: No results found.  Lab Results:  CBC    Component Value Date/Time   WBC 5.0 08/09/2018 0935   RBC 4.08 (L) 08/09/2018 0935   HGB 12.0 (L) 08/09/2018 0935   HCT 39.4 08/09/2018 0935   PLT 115 (L) 08/09/2018 0935   MCV 96.6 08/09/2018 0935   MCH 29.4 08/09/2018 0935   MCHC 30.5 08/09/2018 0935   RDW 13.9 08/09/2018 0935   LYMPHSABS 0.3 (L) 07/29/2011 2050   MONOABS 0.4 07/29/2011 2050   EOSABS 0.1 07/29/2011 2050   BASOSABS 0.0 07/29/2011 2050    BMET    Component Value Date/Time   NA 143 08/09/2018 0935   K 4.2 08/09/2018 0935   CL 111 08/09/2018 0935   CO2 18 (L) 08/09/2018 0935   GLUCOSE 132 (H) 08/09/2018 0935   BUN 66 (H) 08/09/2018 0935   CREATININE 3.99 (H) 08/09/2018 0935   CREATININE 2.86 (H) 10/17/2011 1556   CALCIUM 9.2 08/09/2018 0935   GFRNONAA 16 (L) 08/09/2018 0935   GFRAA 18 (L) 08/09/2018 0935    BNP No results found for: BNP  ProBNP    Component Value Date/Time  PROBNP 1316.0 (H) 07/29/2011 2053    Specialty Problems      Pulmonary Problems   OSA (obstructive sleep apnea)              Allergies  Allergen Reactions  . Clindamycin Hcl Shortness Of Breath, Itching and Rash    Wheezing and erythema/itching at IV site 01/27/12  . Prednisone Rash    Immunization History  Administered Date(s) Administered  . Hepatitis A, Adult 02/01/2020  . Hepatitis B, adult 11/03/2019, 12/01/2019, 12/31/2019, 08/22/2020  . Hepatitis B, ped/adol 05/31/2020  . Hepb-cpg 05/31/2020  . Influenza Split 08/28/2013, 09/14/2014, 10/03/2015  . Influenza Whole 11/10/2012  . Influenza, Seasonal, Injecte, Preservative Fre 09/11/2011, 11/11/2012, 08/24/2013, 08/24/2014, 09/14/2014, 09/27/2015, 08/14/2016, 09/23/2017  . Influenza,inj,Quad PF,6+ Mos 09/27/2015, 08/14/2016, 09/23/2017, 09/14/2018, 08/04/2019, 08/22/2020  .  Influenza-Unspecified 09/11/2011, 11/11/2012, 08/24/2013, 08/24/2014, 09/23/2017  . PFIZER SARS-COV-2 Vaccination 02/04/2020, 02/28/2020, 10/24/2020  . Pneumococcal Conjugate-13 02/26/2017  . Pneumococcal Polysaccharide-23 10/24/2012, 10/27/2019  . Tdap 02/26/2017  . Zoster Recombinat (Shingrix) 12/21/2019, 03/14/2020    Past Medical History:  Diagnosis Date  . Arthritis    "Every where"  . Blood transfusion    with heart transplant  . Carpal tunnel syndrome    Right  . CHEST PAIN UNSPECIFIED   . CONGESTIVE HEART FAILURE UNSPECIFIED   . Diabetes mellitus   . Diarrhea   . Edema   . ERECTILE DYSFUNCTION, ORGANIC   . Gout, unspecified   . Hypertension   . Hypothyroidism   . Renal insufficiency    pt reports levels have been at 4.8  . SYSTOLIC HEART FAILURE, ACUTE ON CHRONIC   . SYSTOLIC HEART FAILURE, CHRONIC   . VENTRICULAR TACHYCARDIA     Tobacco History: Social History   Tobacco Use  Smoking Status Former Smoker  . Packs/day: 0.10  . Years: 30.00  . Pack years: 3.00  . Quit date: 08/24/2009  . Years since quitting: 11.2  Smokeless Tobacco Never Used   Counseling given: Not Answered   Continue to not smoke  Outpatient Encounter Medications as of 12/07/2020  Medication Sig  . amoxicillin (AMOXIL) 500 MG capsule Take 500 mg by mouth daily.  Marland Kitchen aspirin EC 81 MG tablet Take 81 mg by mouth daily.  . carvedilol (COREG) 25 MG tablet Take 25 mg by mouth 2 (two) times daily with a meal.   . cloNIDine (CATAPRES) 0.3 MG tablet Take 1 tablet by mouth 2 (two) times daily.  . colchicine 0.6 MG tablet Take 0.6 mg by mouth daily as needed. For gout  . febuxostat (ULORIC) 40 MG tablet Take 80 mg by mouth daily.  . hydrALAZINE (APRESOLINE) 100 MG tablet Take 100 mg by mouth 2 (two) times daily.  Marland Kitchen HYDROcodone-acetaminophen (NORCO) 5-325 MG per tablet Take 1 tablet by mouth every 6 (six) hours as needed for pain.  Marland Kitchen insulin NPH-insulin regular (NOVOLIN 70/30) (70-30) 100 UNIT/ML  injection Inject 30 Units into the skin 2 (two) times daily with a meal.   . levothyroxine (SYNTHROID, LEVOTHROID) 25 MCG tablet Take 25 mcg by mouth daily.    . Multiple Vitamin (MULTIVITAMIN) tablet Take 1 tablet by mouth daily.    . mycophenolate (CELLCEPT) 500 MG tablet Take 1,500 mg by mouth 2 (two) times daily.   . pantoprazole (PROTONIX) 40 MG tablet Take 40 mg by mouth daily.  . pravastatin (PRAVACHOL) 20 MG tablet Take 20 mg by mouth daily.    . predniSONE (DELTASONE) 10 MG tablet Take 5 mg by mouth daily.   Marland Kitchen  tacrolimus (PROGRAF) 1 MG capsule Take 5 mg by mouth See admin instructions. Taking 2 capsules (2mg  dose) in the Am and 3 capsules (3mg  dose) at bedtime. Total 5mg  daily  . torsemide (DEMADEX) 20 MG tablet Take 20 mg by mouth daily.    No facility-administered encounter medications on file as of 12/07/2020.     Review of Systems  Review of Systems  Constitutional: Negative for activity change, chills, fatigue, fever and unexpected weight change.  HENT: Negative for postnasal drip, rhinorrhea, sinus pressure, sinus pain and sore throat.   Eyes: Negative.   Respiratory: Negative for cough, shortness of breath and wheezing.   Cardiovascular: Negative for chest pain and palpitations.  Gastrointestinal: Negative for constipation, diarrhea, nausea and vomiting.  Endocrine: Negative.   Genitourinary: Negative.   Musculoskeletal: Negative.   Skin: Negative.   Neurological: Negative for dizziness and headaches.  Psychiatric/Behavioral: Negative.  Negative for dysphoric mood. The patient is not nervous/anxious.   All other systems reviewed and are negative.    Physical Exam  BP 112/72 (BP Location: Right Leg, Cuff Size: Normal)   Pulse 87   Temp 98 F (36.7 C)   Ht 5\' 4"  (1.626 m)   Wt 191 lb (86.6 kg)   SpO2 97%   BMI 32.79 kg/m   Wt Readings from Last 5 Encounters:  12/07/20 191 lb (86.6 kg)  10/03/19 212 lb 6.4 oz (96.3 kg)  09/17/18 221 lb (100.2 kg)  08/13/18  208 lb (94.3 kg)  08/09/18 218 lb 6.4 oz (99.1 kg)    BMI Readings from Last 5 Encounters:  12/07/20 32.79 kg/m  10/03/19 34.28 kg/m  09/17/18 35.67 kg/m  08/13/18 33.57 kg/m  08/09/18 35.25 kg/m     Physical Exam Vitals and nursing note reviewed.  Constitutional:      General: He is not in acute distress.    Appearance: Normal appearance. He is obese.  HENT:     Head: Normocephalic and atraumatic.     Right Ear: Hearing, tympanic membrane, ear canal and external ear normal.     Left Ear: Hearing, tympanic membrane, ear canal and external ear normal.     Nose: Nose normal. No mucosal edema or rhinorrhea.     Right Turbinates: Not enlarged.     Left Turbinates: Not enlarged.     Mouth/Throat:     Mouth: Mucous membranes are dry.     Pharynx: Oropharynx is clear. No oropharyngeal exudate.  Eyes:     Pupils: Pupils are equal, round, and reactive to light.  Cardiovascular:     Rate and Rhythm: Normal rate and regular rhythm.     Pulses: Normal pulses.     Heart sounds: Normal heart sounds. No murmur heard.   Pulmonary:     Effort: Pulmonary effort is normal.     Breath sounds: Normal breath sounds. No decreased breath sounds, wheezing or rales.  Musculoskeletal:     Cervical back: Normal range of motion.     Right lower leg: No edema.     Left lower leg: No edema.  Lymphadenopathy:     Cervical: No cervical adenopathy.  Skin:    General: Skin is warm and dry.     Capillary Refill: Capillary refill takes less than 2 seconds.     Findings: No erythema or rash.  Neurological:     General: No focal deficit present.     Mental Status: He is alert and oriented to person, place, and time.  Motor: No weakness.     Coordination: Coordination normal.     Gait: Gait is intact. Gait normal.  Psychiatric:        Mood and Affect: Mood normal.        Behavior: Behavior normal. Behavior is cooperative.        Thought Content: Thought content normal.        Judgment:  Judgment normal.       Assessment & Plan:   OSA (obstructive sleep apnea) Plan: Continue CPAP therapy Refill supplies Follow-up in 1 year Patient bring SD card by office so we can obtain a download  Healthcare maintenance Plan: Remain up-to-date with recommended vaccinations    Return in about 1 year (around 12/07/2021), or if symptoms worsen or fail to improve, for Follow up with Dr. Halford Chessman.   Lauraine Rinne, NP 12/07/2020   This appointment required 26 minutes of patient care (this includes precharting, chart review, review of results, face-to-face care, etc.).

## 2020-12-07 NOTE — Patient Instructions (Addendum)
You were seen today by Lauraine Rinne, NP  for:   1. OSA (obstructive sleep apnea)  Please bring your SD card by our office and we can obtain a CPAP download sometime over the next few weeks  We recommend that you continue using your CPAP daily >>>Keep up the hard work using your device >>> Goal should be wearing this for the entire night that you are sleeping, at least 4 to 6 hours  Remember:  . Do not drive or operate heavy machinery if tired or drowsy.  . Please notify the supply company and office if you are unable to use your device regularly due to missing supplies or machine being broken.  . Work on maintaining a healthy weight and following your recommended nutrition plan  . Maintain proper daily exercise and movement  . Maintaining proper use of your device can also help improve management of other chronic illnesses such as: Blood pressure, blood sugars, and weight management.   BiPAP/ CPAP Cleaning:  >>>Clean weekly, with Dawn soap, and bottle brush.  Set up to air dry. >>> Wipe mask out daily with wet wipe or towelette   2. Healthcare maintenance  Remain up to date with vaccinations    Follow Up:    Return in about 1 year (around 12/07/2021), or if symptoms worsen or fail to improve, for Follow up with Dr. Halford Chessman.   Notification of test results are managed in the following manner: If there are  any recommendations or changes to the  plan of care discussed in office today,  we will contact you and let you know what they are. If you do not hear from [place], then your results are normal and you can view them through your  MyChart account , or a letter will be sent to you. Thank you again for trusting [place] with your care  - Thank you, [facility]    It is flu season:   >>> Best ways to protect herself from the flu: Receive the yearly flu vaccine, practice good hand hygiene washing with soap and also using hand sanitizer when available, eat a nutritious meals, get adequate  rest, hydrate appropriately       Please contact the office if your symptoms worsen or you have concerns that you are not improving.   Thank you for choosing [facility] for your healthcare, and for allowing [place] to partner with you on your healthcare journey. I am thankful to be able to provide care to you today.   Wyn Quaker FNP-C    Living With Sleep Apnea Sleep apnea is a condition in which breathing pauses or becomes shallow during sleep. Sleep apnea is most commonly caused by a collapsed or blocked airway. People with sleep apnea snore loudly and have times when they gasp and stop breathing for 10 seconds or more during sleep. This happens over and over during the night. This disrupts your sleep and keeps your body from getting the rest that it needs, which can cause tiredness and lack of energy (fatigue) during the day. The breaks in breathing also interrupt the deep sleep that you need to feel rested. Even if you do not completely wake up from the gaps in breathing, your sleep may not be restful. You may also have a headache in the morning and low energy during the day, and you may feel anxious or depressed. How can sleep apnea affect me? Sleep apnea increases your chances of extreme tiredness during the day (  daytime fatigue). It can also increase your risk for health conditions, such as:  Heart attack.  Stroke.  Diabetes.  Heart failure.  Irregular heartbeat.  High blood pressure. If you have daytime fatigue as a result of sleep apnea, you may be more likely to:  Perform poorly at school or work.  Fall asleep while driving.  Have difficulty with attention.  Develop depression or anxiety.  Become severely overweight (obese).  Have sexual dysfunction. What actions can I take to manage sleep apnea? Sleep apnea treatment  If you were given a device to open your airway while you sleep, use it only as told by your health care provider. You may be  given: ? An oral appliance. This is a custom-made mouthpiece that shifts your lower jaw forward. ? A continuous positive airway pressure (CPAP) device. This device blows air through a mask when you breathe out (exhale). ? A nasal expiratory positive airway pressure (EPAP) device. This device has valves that you put into each nostril. ? A bi-level positive airway pressure (BPAP) device. This device blows air through a mask when you breathe in (inhale) and breathe out (exhale).  You may need surgery if other treatments do not work for you.   Sleep habits  Go to sleep and wake up at the same time every day. This helps set your internal clock (circadian rhythm) for sleeping. ? If you stay up later than usual, such as on weekends, try to get up in the morning within 2 hours of your normal wake time.  Try to get at least 7-9 hours of sleep each night.  Stop computer, tablet, and mobile phone use a few hours before bedtime.  Do not take long naps during the day. If you nap, limit it to 30 minutes.  Have a relaxing bedtime routine. Reading or listening to music may relax you and help you sleep.  Use your bedroom only for sleep. ? Keep your television and computer out of your bedroom. ? Keep your bedroom cool, dark, and quiet. ? Use a supportive mattress and pillows.  Follow your health care provider's instructions for other changes to sleep habits. Nutrition  Do not eat heavy meals in the evening.  Do not have caffeine in the later part of the day. The effects of caffeine can last for more than 5 hours.  Follow your health care provider's or dietitian's instructions for any diet changes. Lifestyle  Do not drink alcohol before bedtime. Alcohol can cause you to fall asleep at first, but then it can cause you to wake up in the middle of the night and have trouble getting back to sleep.  Do not use any products that contain nicotine or tobacco, such as cigarettes and e-cigarettes. If you  need help quitting, ask your health care provider.      Medicines  Take over-the-counter and prescription medicines only as told by your health care provider.  Do not use over-the-counter sleep medicine. You can become dependent on this medicine, and it can make sleep apnea worse.  Do not use medicines, such as sedatives and narcotics, unless told by your health care provider. Activity  Exercise on most days, but avoid exercising in the evening. Exercising near bedtime can interfere with sleeping.  If possible, spend time outside every day. Natural light helps regulate your circadian rhythm. General information  Lose weight if you need to, and maintain a healthy weight.  Keep all follow-up visits as told by your health care provider. This  is important.  If you are having surgery, make sure to tell your health care provider that you have sleep apnea. You may need to bring your device with you. Where to find more information Learn more about sleep apnea and daytime fatigue from:  American Sleep Association: sleepassociation.[place]: sleepfoundation.org  National Heart, Lung, and Blood Institute: https://www.[place]man-[place].biz/ Summary  Sleep apnea can cause daytime fatigue and other serious health conditions.  Both sleep apnea and daytime fatigue can be bad for your health and well-being.  You may need to wear a device while sleeping to help keep your airway open.  If you are having surgery, make sure to tell your health care provider that you have sleep apnea. You may need to bring your device with you.  Making changes to sleep habits, diet, lifestyle, and activity can help you manage sleep apnea. This information is not intended to replace advice given to you by your health care provider. Make sure you discuss any questions you have with your health care provider. Document Revised: 03/04/2019 Document Reviewed: 02/04/2018 Elsevier Patient Education  [place].

## 2020-12-07 NOTE — Assessment & Plan Note (Signed)
Plan: Continue CPAP therapy Refill supplies Follow-up in 1 year Patient bring SD card by office so we can obtain a download

## 2020-12-07 NOTE — Assessment & Plan Note (Signed)
Plan: Remain up-to-date with recommended vaccinations

## 2020-12-07 NOTE — Progress Notes (Signed)
Reviewed and agree with assessment/plan.   Chesley Mires, MD [facility] 12/07/2020, 11:41 AM Pager:  [phone]

## 2020-12-08 DIAGNOSIS — Z992 Dependence on renal dialysis: Secondary | ICD-10-CM | POA: Diagnosis not present

## 2020-12-08 DIAGNOSIS — D509 Iron deficiency anemia, unspecified: Secondary | ICD-10-CM | POA: Diagnosis not present

## 2020-12-08 DIAGNOSIS — D631 Anemia in chronic kidney disease: Secondary | ICD-10-CM | POA: Diagnosis not present

## 2020-12-08 DIAGNOSIS — N186 End stage renal disease: Secondary | ICD-10-CM | POA: Diagnosis not present

## 2020-12-08 DIAGNOSIS — N2581 Secondary hyperparathyroidism of renal origin: Secondary | ICD-10-CM | POA: Diagnosis not present

## 2020-12-11 DIAGNOSIS — D509 Iron deficiency anemia, unspecified: Secondary | ICD-10-CM | POA: Diagnosis not present

## 2020-12-11 DIAGNOSIS — D631 Anemia in chronic kidney disease: Secondary | ICD-10-CM | POA: Diagnosis not present

## 2020-12-11 DIAGNOSIS — N186 End stage renal disease: Secondary | ICD-10-CM | POA: Diagnosis not present

## 2020-12-11 DIAGNOSIS — Z992 Dependence on renal dialysis: Secondary | ICD-10-CM | POA: Diagnosis not present

## 2020-12-11 DIAGNOSIS — N2581 Secondary hyperparathyroidism of renal origin: Secondary | ICD-10-CM | POA: Diagnosis not present

## 2020-12-13 DIAGNOSIS — N186 End stage renal disease: Secondary | ICD-10-CM | POA: Diagnosis not present

## 2020-12-13 DIAGNOSIS — N2581 Secondary hyperparathyroidism of renal origin: Secondary | ICD-10-CM | POA: Diagnosis not present

## 2020-12-13 DIAGNOSIS — Z992 Dependence on renal dialysis: Secondary | ICD-10-CM | POA: Diagnosis not present

## 2020-12-13 DIAGNOSIS — D509 Iron deficiency anemia, unspecified: Secondary | ICD-10-CM | POA: Diagnosis not present

## 2020-12-13 DIAGNOSIS — D631 Anemia in chronic kidney disease: Secondary | ICD-10-CM | POA: Diagnosis not present

## 2020-12-17 DIAGNOSIS — D849 Immunodeficiency, unspecified: Secondary | ICD-10-CM | POA: Diagnosis not present

## 2020-12-17 DIAGNOSIS — N186 End stage renal disease: Secondary | ICD-10-CM | POA: Diagnosis not present

## 2020-12-17 DIAGNOSIS — Z992 Dependence on renal dialysis: Secondary | ICD-10-CM | POA: Diagnosis not present

## 2020-12-17 DIAGNOSIS — U071 COVID-19: Secondary | ICD-10-CM | POA: Diagnosis not present

## 2020-12-17 DIAGNOSIS — N2581 Secondary hyperparathyroidism of renal origin: Secondary | ICD-10-CM | POA: Diagnosis not present

## 2020-12-17 DIAGNOSIS — R197 Diarrhea, unspecified: Secondary | ICD-10-CM | POA: Diagnosis not present

## 2020-12-17 DIAGNOSIS — R6883 Chills (without fever): Secondary | ICD-10-CM | POA: Diagnosis not present

## 2020-12-17 DIAGNOSIS — D509 Iron deficiency anemia, unspecified: Secondary | ICD-10-CM | POA: Diagnosis not present

## 2020-12-17 DIAGNOSIS — D631 Anemia in chronic kidney disease: Secondary | ICD-10-CM | POA: Diagnosis not present

## 2020-12-17 DIAGNOSIS — N185 Chronic kidney disease, stage 5: Secondary | ICD-10-CM | POA: Diagnosis not present

## 2020-12-18 DIAGNOSIS — R197 Diarrhea, unspecified: Secondary | ICD-10-CM | POA: Diagnosis not present

## 2020-12-18 DIAGNOSIS — Z941 Heart transplant status: Secondary | ICD-10-CM | POA: Diagnosis not present

## 2020-12-18 DIAGNOSIS — D849 Immunodeficiency, unspecified: Secondary | ICD-10-CM | POA: Diagnosis not present

## 2020-12-18 DIAGNOSIS — U071 COVID-19: Secondary | ICD-10-CM | POA: Diagnosis not present

## 2020-12-18 DIAGNOSIS — N185 Chronic kidney disease, stage 5: Secondary | ICD-10-CM | POA: Diagnosis not present

## 2020-12-19 DIAGNOSIS — U071 COVID-19: Secondary | ICD-10-CM | POA: Diagnosis not present

## 2020-12-19 DIAGNOSIS — D631 Anemia in chronic kidney disease: Secondary | ICD-10-CM | POA: Diagnosis not present

## 2020-12-19 DIAGNOSIS — N2581 Secondary hyperparathyroidism of renal origin: Secondary | ICD-10-CM | POA: Diagnosis not present

## 2020-12-19 DIAGNOSIS — Z992 Dependence on renal dialysis: Secondary | ICD-10-CM | POA: Diagnosis not present

## 2020-12-19 DIAGNOSIS — D509 Iron deficiency anemia, unspecified: Secondary | ICD-10-CM | POA: Diagnosis not present

## 2020-12-19 DIAGNOSIS — N186 End stage renal disease: Secondary | ICD-10-CM | POA: Diagnosis not present

## 2020-12-21 ENCOUNTER — Telehealth: Payer: Self-pay | Admitting: Pulmonary Disease

## 2020-12-21 DIAGNOSIS — Z992 Dependence on renal dialysis: Secondary | ICD-10-CM | POA: Diagnosis not present

## 2020-12-21 DIAGNOSIS — D509 Iron deficiency anemia, unspecified: Secondary | ICD-10-CM | POA: Diagnosis not present

## 2020-12-21 DIAGNOSIS — N2581 Secondary hyperparathyroidism of renal origin: Secondary | ICD-10-CM | POA: Diagnosis not present

## 2020-12-21 DIAGNOSIS — N186 End stage renal disease: Secondary | ICD-10-CM | POA: Diagnosis not present

## 2020-12-21 DIAGNOSIS — U071 COVID-19: Secondary | ICD-10-CM | POA: Diagnosis not present

## 2020-12-21 DIAGNOSIS — D631 Anemia in chronic kidney disease: Secondary | ICD-10-CM | POA: Diagnosis not present

## 2020-12-21 NOTE — Telephone Encounter (Signed)
ATC patient unable to reach LM to call back office (x1)  If patient calls back ask him if he wants to come pick it up today we can get a download from it, or does he want to come in next week ?

## 2020-12-24 DIAGNOSIS — D509 Iron deficiency anemia, unspecified: Secondary | ICD-10-CM | POA: Diagnosis not present

## 2020-12-24 DIAGNOSIS — N2581 Secondary hyperparathyroidism of renal origin: Secondary | ICD-10-CM | POA: Diagnosis not present

## 2020-12-24 DIAGNOSIS — U071 COVID-19: Secondary | ICD-10-CM | POA: Diagnosis not present

## 2020-12-24 DIAGNOSIS — N186 End stage renal disease: Secondary | ICD-10-CM | POA: Diagnosis not present

## 2020-12-24 DIAGNOSIS — Z992 Dependence on renal dialysis: Secondary | ICD-10-CM | POA: Diagnosis not present

## 2020-12-24 DIAGNOSIS — D631 Anemia in chronic kidney disease: Secondary | ICD-10-CM | POA: Diagnosis not present

## 2020-12-25 DIAGNOSIS — N186 End stage renal disease: Secondary | ICD-10-CM | POA: Diagnosis not present

## 2020-12-25 DIAGNOSIS — Z992 Dependence on renal dialysis: Secondary | ICD-10-CM | POA: Diagnosis not present

## 2020-12-25 DIAGNOSIS — T8620 Unspecified complication of heart transplant: Secondary | ICD-10-CM | POA: Diagnosis not present

## 2020-12-27 ENCOUNTER — Telehealth: Payer: Self-pay | Admitting: Pulmonary Disease

## 2020-12-27 DIAGNOSIS — Z992 Dependence on renal dialysis: Secondary | ICD-10-CM | POA: Diagnosis not present

## 2020-12-27 DIAGNOSIS — N186 End stage renal disease: Secondary | ICD-10-CM | POA: Diagnosis not present

## 2020-12-27 DIAGNOSIS — N2581 Secondary hyperparathyroidism of renal origin: Secondary | ICD-10-CM | POA: Diagnosis not present

## 2020-12-27 DIAGNOSIS — D509 Iron deficiency anemia, unspecified: Secondary | ICD-10-CM | POA: Diagnosis not present

## 2020-12-27 DIAGNOSIS — D631 Anemia in chronic kidney disease: Secondary | ICD-10-CM | POA: Diagnosis not present

## 2020-12-27 DIAGNOSIS — D689 Coagulation defect, unspecified: Secondary | ICD-10-CM | POA: Diagnosis not present

## 2020-12-27 NOTE — Telephone Encounter (Signed)
Called and went over CPAP report per Dr Halford Chessman with patient. All questions answered and patient expressed full understanding. Patient stated he was already on the way to the [place] office to pick up the SD card which is in a sealed envelope with patient name and date of birth and was on top of Dr Juanetta Gosling pile in the cabinet in [place].

## 2020-12-27 NOTE — Telephone Encounter (Signed)
CPAP 09/22/20 to 12/20/20 >> used on 90 of 90 nights with average 11 hrs 14 min.  Average AHI 3.7 with CPAP 15 cm H2O.   Please let him know CPAP report shows good control of sleep apnea with current settings.

## 2020-12-28 NOTE — Telephone Encounter (Signed)
Lauren and Anderson Malta, please advise if we still have this or if has come to pick this up yet. Thanks.

## 2020-12-28 NOTE — Telephone Encounter (Signed)
See phone note from 12/27/2020. Will close encounter.

## 2020-12-29 DIAGNOSIS — D509 Iron deficiency anemia, unspecified: Secondary | ICD-10-CM | POA: Diagnosis not present

## 2020-12-29 DIAGNOSIS — N2581 Secondary hyperparathyroidism of renal origin: Secondary | ICD-10-CM | POA: Diagnosis not present

## 2020-12-29 DIAGNOSIS — D689 Coagulation defect, unspecified: Secondary | ICD-10-CM | POA: Diagnosis not present

## 2020-12-29 DIAGNOSIS — N186 End stage renal disease: Secondary | ICD-10-CM | POA: Diagnosis not present

## 2020-12-29 DIAGNOSIS — Z992 Dependence on renal dialysis: Secondary | ICD-10-CM | POA: Diagnosis not present

## 2021-01-01 DIAGNOSIS — N2581 Secondary hyperparathyroidism of renal origin: Secondary | ICD-10-CM | POA: Diagnosis not present

## 2021-01-01 DIAGNOSIS — D689 Coagulation defect, unspecified: Secondary | ICD-10-CM | POA: Diagnosis not present

## 2021-01-01 DIAGNOSIS — N186 End stage renal disease: Secondary | ICD-10-CM | POA: Diagnosis not present

## 2021-01-01 DIAGNOSIS — Z992 Dependence on renal dialysis: Secondary | ICD-10-CM | POA: Diagnosis not present

## 2021-01-01 DIAGNOSIS — D509 Iron deficiency anemia, unspecified: Secondary | ICD-10-CM | POA: Diagnosis not present

## 2021-01-03 DIAGNOSIS — D509 Iron deficiency anemia, unspecified: Secondary | ICD-10-CM | POA: Diagnosis not present

## 2021-01-03 DIAGNOSIS — N2581 Secondary hyperparathyroidism of renal origin: Secondary | ICD-10-CM | POA: Diagnosis not present

## 2021-01-03 DIAGNOSIS — N186 End stage renal disease: Secondary | ICD-10-CM | POA: Diagnosis not present

## 2021-01-03 DIAGNOSIS — Z992 Dependence on renal dialysis: Secondary | ICD-10-CM | POA: Diagnosis not present

## 2021-01-03 DIAGNOSIS — D689 Coagulation defect, unspecified: Secondary | ICD-10-CM | POA: Diagnosis not present

## 2021-01-05 DIAGNOSIS — D509 Iron deficiency anemia, unspecified: Secondary | ICD-10-CM | POA: Diagnosis not present

## 2021-01-05 DIAGNOSIS — N186 End stage renal disease: Secondary | ICD-10-CM | POA: Diagnosis not present

## 2021-01-05 DIAGNOSIS — N2581 Secondary hyperparathyroidism of renal origin: Secondary | ICD-10-CM | POA: Diagnosis not present

## 2021-01-05 DIAGNOSIS — D689 Coagulation defect, unspecified: Secondary | ICD-10-CM | POA: Diagnosis not present

## 2021-01-05 DIAGNOSIS — Z992 Dependence on renal dialysis: Secondary | ICD-10-CM | POA: Diagnosis not present

## 2021-01-08 DIAGNOSIS — Z992 Dependence on renal dialysis: Secondary | ICD-10-CM | POA: Diagnosis not present

## 2021-01-08 DIAGNOSIS — D509 Iron deficiency anemia, unspecified: Secondary | ICD-10-CM | POA: Diagnosis not present

## 2021-01-08 DIAGNOSIS — N186 End stage renal disease: Secondary | ICD-10-CM | POA: Diagnosis not present

## 2021-01-08 DIAGNOSIS — D689 Coagulation defect, unspecified: Secondary | ICD-10-CM | POA: Diagnosis not present

## 2021-01-08 DIAGNOSIS — N2581 Secondary hyperparathyroidism of renal origin: Secondary | ICD-10-CM | POA: Diagnosis not present

## 2021-01-09 DIAGNOSIS — M25522 Pain in left elbow: Secondary | ICD-10-CM | POA: Diagnosis not present

## 2021-01-09 DIAGNOSIS — H543 Unqualified visual loss, both eyes: Secondary | ICD-10-CM | POA: Diagnosis not present

## 2021-01-09 DIAGNOSIS — M5489 Other dorsalgia: Secondary | ICD-10-CM | POA: Diagnosis not present

## 2021-01-09 DIAGNOSIS — M150 Primary generalized (osteo)arthritis: Secondary | ICD-10-CM | POA: Diagnosis not present

## 2021-01-09 DIAGNOSIS — M1A09X0 Idiopathic chronic gout, multiple sites, without tophus (tophi): Secondary | ICD-10-CM | POA: Diagnosis not present

## 2021-01-09 DIAGNOSIS — M25531 Pain in right wrist: Secondary | ICD-10-CM | POA: Diagnosis not present

## 2021-01-09 DIAGNOSIS — Z6834 Body mass index (BMI) 34.0-34.9, adult: Secondary | ICD-10-CM | POA: Diagnosis not present

## 2021-01-09 DIAGNOSIS — N186 End stage renal disease: Secondary | ICD-10-CM | POA: Diagnosis not present

## 2021-01-09 DIAGNOSIS — I509 Heart failure, unspecified: Secondary | ICD-10-CM | POA: Diagnosis not present

## 2021-01-09 DIAGNOSIS — E669 Obesity, unspecified: Secondary | ICD-10-CM | POA: Diagnosis not present

## 2021-01-10 DIAGNOSIS — N186 End stage renal disease: Secondary | ICD-10-CM | POA: Diagnosis not present

## 2021-01-10 DIAGNOSIS — Z992 Dependence on renal dialysis: Secondary | ICD-10-CM | POA: Diagnosis not present

## 2021-01-10 DIAGNOSIS — N2581 Secondary hyperparathyroidism of renal origin: Secondary | ICD-10-CM | POA: Diagnosis not present

## 2021-01-10 DIAGNOSIS — D509 Iron deficiency anemia, unspecified: Secondary | ICD-10-CM | POA: Diagnosis not present

## 2021-01-10 DIAGNOSIS — D689 Coagulation defect, unspecified: Secondary | ICD-10-CM | POA: Diagnosis not present

## 2021-01-12 DIAGNOSIS — D689 Coagulation defect, unspecified: Secondary | ICD-10-CM | POA: Diagnosis not present

## 2021-01-12 DIAGNOSIS — N2581 Secondary hyperparathyroidism of renal origin: Secondary | ICD-10-CM | POA: Diagnosis not present

## 2021-01-12 DIAGNOSIS — Z992 Dependence on renal dialysis: Secondary | ICD-10-CM | POA: Diagnosis not present

## 2021-01-12 DIAGNOSIS — D509 Iron deficiency anemia, unspecified: Secondary | ICD-10-CM | POA: Diagnosis not present

## 2021-01-12 DIAGNOSIS — N186 End stage renal disease: Secondary | ICD-10-CM | POA: Diagnosis not present

## 2021-01-15 DIAGNOSIS — D509 Iron deficiency anemia, unspecified: Secondary | ICD-10-CM | POA: Diagnosis not present

## 2021-01-15 DIAGNOSIS — N186 End stage renal disease: Secondary | ICD-10-CM | POA: Diagnosis not present

## 2021-01-15 DIAGNOSIS — Z992 Dependence on renal dialysis: Secondary | ICD-10-CM | POA: Diagnosis not present

## 2021-01-15 DIAGNOSIS — D689 Coagulation defect, unspecified: Secondary | ICD-10-CM | POA: Diagnosis not present

## 2021-01-15 DIAGNOSIS — N2581 Secondary hyperparathyroidism of renal origin: Secondary | ICD-10-CM | POA: Diagnosis not present

## 2021-01-17 DIAGNOSIS — N186 End stage renal disease: Secondary | ICD-10-CM | POA: Diagnosis not present

## 2021-01-17 DIAGNOSIS — D689 Coagulation defect, unspecified: Secondary | ICD-10-CM | POA: Diagnosis not present

## 2021-01-17 DIAGNOSIS — D509 Iron deficiency anemia, unspecified: Secondary | ICD-10-CM | POA: Diagnosis not present

## 2021-01-17 DIAGNOSIS — N2581 Secondary hyperparathyroidism of renal origin: Secondary | ICD-10-CM | POA: Diagnosis not present

## 2021-01-17 DIAGNOSIS — Z992 Dependence on renal dialysis: Secondary | ICD-10-CM | POA: Diagnosis not present

## 2021-01-18 ENCOUNTER — Ambulatory Visit: Payer: Medicare Other | Admitting: Neurology

## 2021-01-18 DIAGNOSIS — R5383 Other fatigue: Secondary | ICD-10-CM | POA: Diagnosis not present

## 2021-01-18 DIAGNOSIS — I119 Hypertensive heart disease without heart failure: Secondary | ICD-10-CM | POA: Diagnosis not present

## 2021-01-18 DIAGNOSIS — E119 Type 2 diabetes mellitus without complications: Secondary | ICD-10-CM | POA: Diagnosis not present

## 2021-01-18 DIAGNOSIS — G629 Polyneuropathy, unspecified: Secondary | ICD-10-CM | POA: Diagnosis not present

## 2021-01-18 DIAGNOSIS — N50819 Testicular pain, unspecified: Secondary | ICD-10-CM | POA: Diagnosis not present

## 2021-01-18 DIAGNOSIS — Z48298 Encounter for aftercare following other organ transplant: Secondary | ICD-10-CM | POA: Diagnosis not present

## 2021-01-18 DIAGNOSIS — D849 Immunodeficiency, unspecified: Secondary | ICD-10-CM | POA: Diagnosis not present

## 2021-01-18 DIAGNOSIS — Z4821 Encounter for aftercare following heart transplant: Secondary | ICD-10-CM | POA: Diagnosis not present

## 2021-01-18 DIAGNOSIS — Z941 Heart transplant status: Secondary | ICD-10-CM | POA: Diagnosis not present

## 2021-01-18 DIAGNOSIS — I10 Essential (primary) hypertension: Secondary | ICD-10-CM | POA: Diagnosis not present

## 2021-01-19 DIAGNOSIS — Z992 Dependence on renal dialysis: Secondary | ICD-10-CM | POA: Diagnosis not present

## 2021-01-19 DIAGNOSIS — N2581 Secondary hyperparathyroidism of renal origin: Secondary | ICD-10-CM | POA: Diagnosis not present

## 2021-01-19 DIAGNOSIS — D509 Iron deficiency anemia, unspecified: Secondary | ICD-10-CM | POA: Diagnosis not present

## 2021-01-19 DIAGNOSIS — D689 Coagulation defect, unspecified: Secondary | ICD-10-CM | POA: Diagnosis not present

## 2021-01-19 DIAGNOSIS — N186 End stage renal disease: Secondary | ICD-10-CM | POA: Diagnosis not present

## 2021-01-22 DIAGNOSIS — D689 Coagulation defect, unspecified: Secondary | ICD-10-CM | POA: Diagnosis not present

## 2021-01-22 DIAGNOSIS — D509 Iron deficiency anemia, unspecified: Secondary | ICD-10-CM | POA: Diagnosis not present

## 2021-01-22 DIAGNOSIS — T8620 Unspecified complication of heart transplant: Secondary | ICD-10-CM | POA: Diagnosis not present

## 2021-01-22 DIAGNOSIS — N186 End stage renal disease: Secondary | ICD-10-CM | POA: Diagnosis not present

## 2021-01-22 DIAGNOSIS — D631 Anemia in chronic kidney disease: Secondary | ICD-10-CM | POA: Diagnosis not present

## 2021-01-22 DIAGNOSIS — N2581 Secondary hyperparathyroidism of renal origin: Secondary | ICD-10-CM | POA: Diagnosis not present

## 2021-01-22 DIAGNOSIS — Z992 Dependence on renal dialysis: Secondary | ICD-10-CM | POA: Diagnosis not present

## 2021-01-24 DIAGNOSIS — N186 End stage renal disease: Secondary | ICD-10-CM | POA: Diagnosis not present

## 2021-01-24 DIAGNOSIS — Z992 Dependence on renal dialysis: Secondary | ICD-10-CM | POA: Diagnosis not present

## 2021-01-24 DIAGNOSIS — D689 Coagulation defect, unspecified: Secondary | ICD-10-CM | POA: Diagnosis not present

## 2021-01-24 DIAGNOSIS — N2581 Secondary hyperparathyroidism of renal origin: Secondary | ICD-10-CM | POA: Diagnosis not present

## 2021-01-24 DIAGNOSIS — D509 Iron deficiency anemia, unspecified: Secondary | ICD-10-CM | POA: Diagnosis not present

## 2021-01-24 DIAGNOSIS — D631 Anemia in chronic kidney disease: Secondary | ICD-10-CM | POA: Diagnosis not present

## 2021-01-25 DIAGNOSIS — I129 Hypertensive chronic kidney disease with stage 1 through stage 4 chronic kidney disease, or unspecified chronic kidney disease: Secondary | ICD-10-CM | POA: Diagnosis not present

## 2021-01-25 DIAGNOSIS — M19022 Primary osteoarthritis, left elbow: Secondary | ICD-10-CM | POA: Diagnosis not present

## 2021-01-25 DIAGNOSIS — Z794 Long term (current) use of insulin: Secondary | ICD-10-CM | POA: Diagnosis not present

## 2021-01-25 DIAGNOSIS — N189 Chronic kidney disease, unspecified: Secondary | ICD-10-CM | POA: Diagnosis not present

## 2021-01-25 DIAGNOSIS — D849 Immunodeficiency, unspecified: Secondary | ICD-10-CM | POA: Diagnosis not present

## 2021-01-25 DIAGNOSIS — A310 Pulmonary mycobacterial infection: Secondary | ICD-10-CM | POA: Diagnosis not present

## 2021-01-25 DIAGNOSIS — E1122 Type 2 diabetes mellitus with diabetic chronic kidney disease: Secondary | ICD-10-CM | POA: Diagnosis not present

## 2021-01-25 DIAGNOSIS — M00822 Arthritis due to other bacteria, left elbow: Secondary | ICD-10-CM | POA: Diagnosis not present

## 2021-01-26 DIAGNOSIS — D631 Anemia in chronic kidney disease: Secondary | ICD-10-CM | POA: Diagnosis not present

## 2021-01-26 DIAGNOSIS — N2581 Secondary hyperparathyroidism of renal origin: Secondary | ICD-10-CM | POA: Diagnosis not present

## 2021-01-26 DIAGNOSIS — Z992 Dependence on renal dialysis: Secondary | ICD-10-CM | POA: Diagnosis not present

## 2021-01-26 DIAGNOSIS — N186 End stage renal disease: Secondary | ICD-10-CM | POA: Diagnosis not present

## 2021-01-26 DIAGNOSIS — D689 Coagulation defect, unspecified: Secondary | ICD-10-CM | POA: Diagnosis not present

## 2021-01-26 DIAGNOSIS — D509 Iron deficiency anemia, unspecified: Secondary | ICD-10-CM | POA: Diagnosis not present

## 2021-01-29 DIAGNOSIS — D631 Anemia in chronic kidney disease: Secondary | ICD-10-CM | POA: Diagnosis not present

## 2021-01-29 DIAGNOSIS — D689 Coagulation defect, unspecified: Secondary | ICD-10-CM | POA: Diagnosis not present

## 2021-01-29 DIAGNOSIS — D509 Iron deficiency anemia, unspecified: Secondary | ICD-10-CM | POA: Diagnosis not present

## 2021-01-29 DIAGNOSIS — N186 End stage renal disease: Secondary | ICD-10-CM | POA: Diagnosis not present

## 2021-01-29 DIAGNOSIS — N2581 Secondary hyperparathyroidism of renal origin: Secondary | ICD-10-CM | POA: Diagnosis not present

## 2021-01-29 DIAGNOSIS — Z992 Dependence on renal dialysis: Secondary | ICD-10-CM | POA: Diagnosis not present

## 2021-01-30 DIAGNOSIS — E039 Hypothyroidism, unspecified: Secondary | ICD-10-CM | POA: Diagnosis not present

## 2021-01-30 DIAGNOSIS — N186 End stage renal disease: Secondary | ICD-10-CM | POA: Diagnosis not present

## 2021-01-30 DIAGNOSIS — I120 Hypertensive chronic kidney disease with stage 5 chronic kidney disease or end stage renal disease: Secondary | ICD-10-CM | POA: Diagnosis not present

## 2021-01-30 DIAGNOSIS — H463 Toxic optic neuropathy: Secondary | ICD-10-CM | POA: Diagnosis not present

## 2021-01-30 DIAGNOSIS — M00822 Arthritis due to other bacteria, left elbow: Secondary | ICD-10-CM | POA: Diagnosis not present

## 2021-01-30 DIAGNOSIS — Z87891 Personal history of nicotine dependence: Secondary | ICD-10-CM | POA: Diagnosis not present

## 2021-01-30 DIAGNOSIS — Z79899 Other long term (current) drug therapy: Secondary | ICD-10-CM | POA: Diagnosis not present

## 2021-01-30 DIAGNOSIS — G4733 Obstructive sleep apnea (adult) (pediatric): Secondary | ICD-10-CM | POA: Diagnosis not present

## 2021-01-30 DIAGNOSIS — D849 Immunodeficiency, unspecified: Secondary | ICD-10-CM | POA: Diagnosis not present

## 2021-01-30 DIAGNOSIS — E1122 Type 2 diabetes mellitus with diabetic chronic kidney disease: Secondary | ICD-10-CM | POA: Diagnosis not present

## 2021-01-30 DIAGNOSIS — D84821 Immunodeficiency due to drugs: Secondary | ICD-10-CM | POA: Diagnosis not present

## 2021-01-30 DIAGNOSIS — A310 Pulmonary mycobacterial infection: Secondary | ICD-10-CM | POA: Diagnosis not present

## 2021-01-30 DIAGNOSIS — Z992 Dependence on renal dialysis: Secondary | ICD-10-CM | POA: Diagnosis not present

## 2021-01-30 DIAGNOSIS — Z941 Heart transplant status: Secondary | ICD-10-CM | POA: Diagnosis not present

## 2021-01-31 DIAGNOSIS — D689 Coagulation defect, unspecified: Secondary | ICD-10-CM | POA: Diagnosis not present

## 2021-01-31 DIAGNOSIS — N186 End stage renal disease: Secondary | ICD-10-CM | POA: Diagnosis not present

## 2021-01-31 DIAGNOSIS — N2581 Secondary hyperparathyroidism of renal origin: Secondary | ICD-10-CM | POA: Diagnosis not present

## 2021-01-31 DIAGNOSIS — D509 Iron deficiency anemia, unspecified: Secondary | ICD-10-CM | POA: Diagnosis not present

## 2021-01-31 DIAGNOSIS — D631 Anemia in chronic kidney disease: Secondary | ICD-10-CM | POA: Diagnosis not present

## 2021-01-31 DIAGNOSIS — Z992 Dependence on renal dialysis: Secondary | ICD-10-CM | POA: Diagnosis not present

## 2021-02-02 DIAGNOSIS — Z992 Dependence on renal dialysis: Secondary | ICD-10-CM | POA: Diagnosis not present

## 2021-02-02 DIAGNOSIS — N2581 Secondary hyperparathyroidism of renal origin: Secondary | ICD-10-CM | POA: Diagnosis not present

## 2021-02-02 DIAGNOSIS — D509 Iron deficiency anemia, unspecified: Secondary | ICD-10-CM | POA: Diagnosis not present

## 2021-02-02 DIAGNOSIS — N186 End stage renal disease: Secondary | ICD-10-CM | POA: Diagnosis not present

## 2021-02-02 DIAGNOSIS — D631 Anemia in chronic kidney disease: Secondary | ICD-10-CM | POA: Diagnosis not present

## 2021-02-02 DIAGNOSIS — D689 Coagulation defect, unspecified: Secondary | ICD-10-CM | POA: Diagnosis not present

## 2021-02-05 DIAGNOSIS — N186 End stage renal disease: Secondary | ICD-10-CM | POA: Diagnosis not present

## 2021-02-05 DIAGNOSIS — Z992 Dependence on renal dialysis: Secondary | ICD-10-CM | POA: Diagnosis not present

## 2021-02-05 DIAGNOSIS — D509 Iron deficiency anemia, unspecified: Secondary | ICD-10-CM | POA: Diagnosis not present

## 2021-02-05 DIAGNOSIS — N2581 Secondary hyperparathyroidism of renal origin: Secondary | ICD-10-CM | POA: Diagnosis not present

## 2021-02-05 DIAGNOSIS — D631 Anemia in chronic kidney disease: Secondary | ICD-10-CM | POA: Diagnosis not present

## 2021-02-05 DIAGNOSIS — D689 Coagulation defect, unspecified: Secondary | ICD-10-CM | POA: Diagnosis not present

## 2021-02-07 DIAGNOSIS — N2581 Secondary hyperparathyroidism of renal origin: Secondary | ICD-10-CM | POA: Diagnosis not present

## 2021-02-07 DIAGNOSIS — D631 Anemia in chronic kidney disease: Secondary | ICD-10-CM | POA: Diagnosis not present

## 2021-02-07 DIAGNOSIS — Z992 Dependence on renal dialysis: Secondary | ICD-10-CM | POA: Diagnosis not present

## 2021-02-07 DIAGNOSIS — N186 End stage renal disease: Secondary | ICD-10-CM | POA: Diagnosis not present

## 2021-02-07 DIAGNOSIS — D689 Coagulation defect, unspecified: Secondary | ICD-10-CM | POA: Diagnosis not present

## 2021-02-07 DIAGNOSIS — D509 Iron deficiency anemia, unspecified: Secondary | ICD-10-CM | POA: Diagnosis not present

## 2021-02-08 DIAGNOSIS — Z87891 Personal history of nicotine dependence: Secondary | ICD-10-CM | POA: Diagnosis not present

## 2021-02-08 DIAGNOSIS — Z992 Dependence on renal dialysis: Secondary | ICD-10-CM | POA: Diagnosis not present

## 2021-02-08 DIAGNOSIS — N186 End stage renal disease: Secondary | ICD-10-CM | POA: Diagnosis not present

## 2021-02-08 DIAGNOSIS — R94131 Abnormal electromyogram [EMG]: Secondary | ICD-10-CM | POA: Diagnosis not present

## 2021-02-08 DIAGNOSIS — Z794 Long term (current) use of insulin: Secondary | ICD-10-CM | POA: Diagnosis not present

## 2021-02-08 DIAGNOSIS — E1122 Type 2 diabetes mellitus with diabetic chronic kidney disease: Secondary | ICD-10-CM | POA: Diagnosis not present

## 2021-02-08 DIAGNOSIS — G6289 Other specified polyneuropathies: Secondary | ICD-10-CM | POA: Diagnosis not present

## 2021-02-08 DIAGNOSIS — Z941 Heart transplant status: Secondary | ICD-10-CM | POA: Diagnosis not present

## 2021-02-08 DIAGNOSIS — I509 Heart failure, unspecified: Secondary | ICD-10-CM | POA: Diagnosis not present

## 2021-02-08 DIAGNOSIS — G629 Polyneuropathy, unspecified: Secondary | ICD-10-CM | POA: Diagnosis not present

## 2021-02-08 DIAGNOSIS — E1142 Type 2 diabetes mellitus with diabetic polyneuropathy: Secondary | ICD-10-CM | POA: Diagnosis not present

## 2021-02-09 DIAGNOSIS — N2581 Secondary hyperparathyroidism of renal origin: Secondary | ICD-10-CM | POA: Diagnosis not present

## 2021-02-09 DIAGNOSIS — D689 Coagulation defect, unspecified: Secondary | ICD-10-CM | POA: Diagnosis not present

## 2021-02-09 DIAGNOSIS — D509 Iron deficiency anemia, unspecified: Secondary | ICD-10-CM | POA: Diagnosis not present

## 2021-02-09 DIAGNOSIS — D631 Anemia in chronic kidney disease: Secondary | ICD-10-CM | POA: Diagnosis not present

## 2021-02-09 DIAGNOSIS — N186 End stage renal disease: Secondary | ICD-10-CM | POA: Diagnosis not present

## 2021-02-09 DIAGNOSIS — Z992 Dependence on renal dialysis: Secondary | ICD-10-CM | POA: Diagnosis not present

## 2021-02-12 DIAGNOSIS — D509 Iron deficiency anemia, unspecified: Secondary | ICD-10-CM | POA: Diagnosis not present

## 2021-02-12 DIAGNOSIS — N2581 Secondary hyperparathyroidism of renal origin: Secondary | ICD-10-CM | POA: Diagnosis not present

## 2021-02-12 DIAGNOSIS — Z992 Dependence on renal dialysis: Secondary | ICD-10-CM | POA: Diagnosis not present

## 2021-02-12 DIAGNOSIS — D689 Coagulation defect, unspecified: Secondary | ICD-10-CM | POA: Diagnosis not present

## 2021-02-12 DIAGNOSIS — D631 Anemia in chronic kidney disease: Secondary | ICD-10-CM | POA: Diagnosis not present

## 2021-02-12 DIAGNOSIS — N186 End stage renal disease: Secondary | ICD-10-CM | POA: Diagnosis not present

## 2021-02-16 DIAGNOSIS — D631 Anemia in chronic kidney disease: Secondary | ICD-10-CM | POA: Diagnosis not present

## 2021-02-16 DIAGNOSIS — N2581 Secondary hyperparathyroidism of renal origin: Secondary | ICD-10-CM | POA: Diagnosis not present

## 2021-02-16 DIAGNOSIS — N186 End stage renal disease: Secondary | ICD-10-CM | POA: Diagnosis not present

## 2021-02-16 DIAGNOSIS — D689 Coagulation defect, unspecified: Secondary | ICD-10-CM | POA: Diagnosis not present

## 2021-02-16 DIAGNOSIS — Z992 Dependence on renal dialysis: Secondary | ICD-10-CM | POA: Diagnosis not present

## 2021-02-16 DIAGNOSIS — D509 Iron deficiency anemia, unspecified: Secondary | ICD-10-CM | POA: Diagnosis not present

## 2021-02-19 DIAGNOSIS — Z992 Dependence on renal dialysis: Secondary | ICD-10-CM | POA: Diagnosis not present

## 2021-02-19 DIAGNOSIS — N186 End stage renal disease: Secondary | ICD-10-CM | POA: Diagnosis not present

## 2021-02-19 DIAGNOSIS — N2581 Secondary hyperparathyroidism of renal origin: Secondary | ICD-10-CM | POA: Diagnosis not present

## 2021-02-19 DIAGNOSIS — D509 Iron deficiency anemia, unspecified: Secondary | ICD-10-CM | POA: Diagnosis not present

## 2021-02-19 DIAGNOSIS — D631 Anemia in chronic kidney disease: Secondary | ICD-10-CM | POA: Diagnosis not present

## 2021-02-19 DIAGNOSIS — D689 Coagulation defect, unspecified: Secondary | ICD-10-CM | POA: Diagnosis not present

## 2021-02-21 DIAGNOSIS — D509 Iron deficiency anemia, unspecified: Secondary | ICD-10-CM | POA: Diagnosis not present

## 2021-02-21 DIAGNOSIS — N2581 Secondary hyperparathyroidism of renal origin: Secondary | ICD-10-CM | POA: Diagnosis not present

## 2021-02-21 DIAGNOSIS — D631 Anemia in chronic kidney disease: Secondary | ICD-10-CM | POA: Diagnosis not present

## 2021-02-21 DIAGNOSIS — D689 Coagulation defect, unspecified: Secondary | ICD-10-CM | POA: Diagnosis not present

## 2021-02-21 DIAGNOSIS — N186 End stage renal disease: Secondary | ICD-10-CM | POA: Diagnosis not present

## 2021-02-21 DIAGNOSIS — Z992 Dependence on renal dialysis: Secondary | ICD-10-CM | POA: Diagnosis not present

## 2021-02-22 DIAGNOSIS — Z992 Dependence on renal dialysis: Secondary | ICD-10-CM | POA: Diagnosis not present

## 2021-02-22 DIAGNOSIS — T8620 Unspecified complication of heart transplant: Secondary | ICD-10-CM | POA: Diagnosis not present

## 2021-02-22 DIAGNOSIS — N186 End stage renal disease: Secondary | ICD-10-CM | POA: Diagnosis not present

## 2021-02-23 DIAGNOSIS — N186 End stage renal disease: Secondary | ICD-10-CM | POA: Diagnosis not present

## 2021-02-23 DIAGNOSIS — Z992 Dependence on renal dialysis: Secondary | ICD-10-CM | POA: Diagnosis not present

## 2021-02-23 DIAGNOSIS — Z79899 Other long term (current) drug therapy: Secondary | ICD-10-CM | POA: Diagnosis not present

## 2021-02-23 DIAGNOSIS — N2581 Secondary hyperparathyroidism of renal origin: Secondary | ICD-10-CM | POA: Diagnosis not present

## 2021-02-26 DIAGNOSIS — N2581 Secondary hyperparathyroidism of renal origin: Secondary | ICD-10-CM | POA: Diagnosis not present

## 2021-02-26 DIAGNOSIS — Z79899 Other long term (current) drug therapy: Secondary | ICD-10-CM | POA: Diagnosis not present

## 2021-02-26 DIAGNOSIS — Z992 Dependence on renal dialysis: Secondary | ICD-10-CM | POA: Diagnosis not present

## 2021-02-26 DIAGNOSIS — N186 End stage renal disease: Secondary | ICD-10-CM | POA: Diagnosis not present

## 2021-02-28 DIAGNOSIS — N2581 Secondary hyperparathyroidism of renal origin: Secondary | ICD-10-CM | POA: Diagnosis not present

## 2021-02-28 DIAGNOSIS — Z79899 Other long term (current) drug therapy: Secondary | ICD-10-CM | POA: Diagnosis not present

## 2021-02-28 DIAGNOSIS — Z992 Dependence on renal dialysis: Secondary | ICD-10-CM | POA: Diagnosis not present

## 2021-02-28 DIAGNOSIS — N186 End stage renal disease: Secondary | ICD-10-CM | POA: Diagnosis not present

## 2021-02-28 DIAGNOSIS — E119 Type 2 diabetes mellitus without complications: Secondary | ICD-10-CM | POA: Diagnosis not present

## 2021-03-02 DIAGNOSIS — Z79899 Other long term (current) drug therapy: Secondary | ICD-10-CM | POA: Diagnosis not present

## 2021-03-02 DIAGNOSIS — N186 End stage renal disease: Secondary | ICD-10-CM | POA: Diagnosis not present

## 2021-03-02 DIAGNOSIS — Z992 Dependence on renal dialysis: Secondary | ICD-10-CM | POA: Diagnosis not present

## 2021-03-02 DIAGNOSIS — N2581 Secondary hyperparathyroidism of renal origin: Secondary | ICD-10-CM | POA: Diagnosis not present

## 2021-03-05 DIAGNOSIS — Z79899 Other long term (current) drug therapy: Secondary | ICD-10-CM | POA: Diagnosis not present

## 2021-03-05 DIAGNOSIS — N2581 Secondary hyperparathyroidism of renal origin: Secondary | ICD-10-CM | POA: Diagnosis not present

## 2021-03-05 DIAGNOSIS — N186 End stage renal disease: Secondary | ICD-10-CM | POA: Diagnosis not present

## 2021-03-05 DIAGNOSIS — Z992 Dependence on renal dialysis: Secondary | ICD-10-CM | POA: Diagnosis not present

## 2021-03-07 DIAGNOSIS — Z992 Dependence on renal dialysis: Secondary | ICD-10-CM | POA: Diagnosis not present

## 2021-03-07 DIAGNOSIS — N186 End stage renal disease: Secondary | ICD-10-CM | POA: Diagnosis not present

## 2021-03-07 DIAGNOSIS — N2581 Secondary hyperparathyroidism of renal origin: Secondary | ICD-10-CM | POA: Diagnosis not present

## 2021-03-07 DIAGNOSIS — Z79899 Other long term (current) drug therapy: Secondary | ICD-10-CM | POA: Diagnosis not present

## 2021-03-09 DIAGNOSIS — Z79899 Other long term (current) drug therapy: Secondary | ICD-10-CM | POA: Diagnosis not present

## 2021-03-09 DIAGNOSIS — Z992 Dependence on renal dialysis: Secondary | ICD-10-CM | POA: Diagnosis not present

## 2021-03-09 DIAGNOSIS — N186 End stage renal disease: Secondary | ICD-10-CM | POA: Diagnosis not present

## 2021-03-09 DIAGNOSIS — N2581 Secondary hyperparathyroidism of renal origin: Secondary | ICD-10-CM | POA: Diagnosis not present

## 2021-03-12 DIAGNOSIS — N186 End stage renal disease: Secondary | ICD-10-CM | POA: Diagnosis not present

## 2021-03-12 DIAGNOSIS — Z992 Dependence on renal dialysis: Secondary | ICD-10-CM | POA: Diagnosis not present

## 2021-03-12 DIAGNOSIS — N2581 Secondary hyperparathyroidism of renal origin: Secondary | ICD-10-CM | POA: Diagnosis not present

## 2021-03-12 DIAGNOSIS — Z79899 Other long term (current) drug therapy: Secondary | ICD-10-CM | POA: Diagnosis not present

## 2021-03-14 DIAGNOSIS — N186 End stage renal disease: Secondary | ICD-10-CM | POA: Diagnosis not present

## 2021-03-14 DIAGNOSIS — N2581 Secondary hyperparathyroidism of renal origin: Secondary | ICD-10-CM | POA: Diagnosis not present

## 2021-03-14 DIAGNOSIS — Z79899 Other long term (current) drug therapy: Secondary | ICD-10-CM | POA: Diagnosis not present

## 2021-03-14 DIAGNOSIS — Z992 Dependence on renal dialysis: Secondary | ICD-10-CM | POA: Diagnosis not present

## 2021-03-16 DIAGNOSIS — Z992 Dependence on renal dialysis: Secondary | ICD-10-CM | POA: Diagnosis not present

## 2021-03-16 DIAGNOSIS — N186 End stage renal disease: Secondary | ICD-10-CM | POA: Diagnosis not present

## 2021-03-16 DIAGNOSIS — Z79899 Other long term (current) drug therapy: Secondary | ICD-10-CM | POA: Diagnosis not present

## 2021-03-16 DIAGNOSIS — N2581 Secondary hyperparathyroidism of renal origin: Secondary | ICD-10-CM | POA: Diagnosis not present

## 2021-03-19 DIAGNOSIS — N2581 Secondary hyperparathyroidism of renal origin: Secondary | ICD-10-CM | POA: Diagnosis not present

## 2021-03-19 DIAGNOSIS — Z992 Dependence on renal dialysis: Secondary | ICD-10-CM | POA: Diagnosis not present

## 2021-03-19 DIAGNOSIS — N186 End stage renal disease: Secondary | ICD-10-CM | POA: Diagnosis not present

## 2021-03-19 DIAGNOSIS — Z79899 Other long term (current) drug therapy: Secondary | ICD-10-CM | POA: Diagnosis not present

## 2021-03-21 DIAGNOSIS — N2581 Secondary hyperparathyroidism of renal origin: Secondary | ICD-10-CM | POA: Diagnosis not present

## 2021-03-21 DIAGNOSIS — Z79899 Other long term (current) drug therapy: Secondary | ICD-10-CM | POA: Diagnosis not present

## 2021-03-21 DIAGNOSIS — Z992 Dependence on renal dialysis: Secondary | ICD-10-CM | POA: Diagnosis not present

## 2021-03-21 DIAGNOSIS — N186 End stage renal disease: Secondary | ICD-10-CM | POA: Diagnosis not present

## 2021-03-23 DIAGNOSIS — N186 End stage renal disease: Secondary | ICD-10-CM | POA: Diagnosis not present

## 2021-03-23 DIAGNOSIS — N2581 Secondary hyperparathyroidism of renal origin: Secondary | ICD-10-CM | POA: Diagnosis not present

## 2021-03-23 DIAGNOSIS — Z992 Dependence on renal dialysis: Secondary | ICD-10-CM | POA: Diagnosis not present

## 2021-03-23 DIAGNOSIS — Z79899 Other long term (current) drug therapy: Secondary | ICD-10-CM | POA: Diagnosis not present

## 2021-03-24 DIAGNOSIS — T8620 Unspecified complication of heart transplant: Secondary | ICD-10-CM | POA: Diagnosis not present

## 2021-03-24 DIAGNOSIS — N186 End stage renal disease: Secondary | ICD-10-CM | POA: Diagnosis not present

## 2021-03-24 DIAGNOSIS — Z992 Dependence on renal dialysis: Secondary | ICD-10-CM | POA: Diagnosis not present

## 2021-03-26 DIAGNOSIS — N2581 Secondary hyperparathyroidism of renal origin: Secondary | ICD-10-CM | POA: Diagnosis not present

## 2021-03-26 DIAGNOSIS — N186 End stage renal disease: Secondary | ICD-10-CM | POA: Diagnosis not present

## 2021-03-26 DIAGNOSIS — Z992 Dependence on renal dialysis: Secondary | ICD-10-CM | POA: Diagnosis not present

## 2021-03-28 DIAGNOSIS — N529 Male erectile dysfunction, unspecified: Secondary | ICD-10-CM | POA: Diagnosis not present

## 2021-03-28 DIAGNOSIS — N2581 Secondary hyperparathyroidism of renal origin: Secondary | ICD-10-CM | POA: Diagnosis not present

## 2021-03-28 DIAGNOSIS — Z992 Dependence on renal dialysis: Secondary | ICD-10-CM | POA: Diagnosis not present

## 2021-03-28 DIAGNOSIS — E118 Type 2 diabetes mellitus with unspecified complications: Secondary | ICD-10-CM | POA: Diagnosis not present

## 2021-03-28 DIAGNOSIS — E1165 Type 2 diabetes mellitus with hyperglycemia: Secondary | ICD-10-CM | POA: Diagnosis not present

## 2021-03-28 DIAGNOSIS — N186 End stage renal disease: Secondary | ICD-10-CM | POA: Diagnosis not present

## 2021-03-30 DIAGNOSIS — Z992 Dependence on renal dialysis: Secondary | ICD-10-CM | POA: Diagnosis not present

## 2021-03-30 DIAGNOSIS — N2581 Secondary hyperparathyroidism of renal origin: Secondary | ICD-10-CM | POA: Diagnosis not present

## 2021-03-30 DIAGNOSIS — N186 End stage renal disease: Secondary | ICD-10-CM | POA: Diagnosis not present

## 2021-04-01 DIAGNOSIS — G894 Chronic pain syndrome: Secondary | ICD-10-CM | POA: Diagnosis not present

## 2021-04-01 DIAGNOSIS — E1340 Other specified diabetes mellitus with diabetic neuropathy, unspecified: Secondary | ICD-10-CM | POA: Diagnosis not present

## 2021-04-02 DIAGNOSIS — Z992 Dependence on renal dialysis: Secondary | ICD-10-CM | POA: Diagnosis not present

## 2021-04-02 DIAGNOSIS — N529 Male erectile dysfunction, unspecified: Secondary | ICD-10-CM | POA: Diagnosis not present

## 2021-04-02 DIAGNOSIS — N186 End stage renal disease: Secondary | ICD-10-CM | POA: Diagnosis not present

## 2021-04-02 DIAGNOSIS — N50811 Right testicular pain: Secondary | ICD-10-CM | POA: Diagnosis not present

## 2021-04-02 DIAGNOSIS — N2581 Secondary hyperparathyroidism of renal origin: Secondary | ICD-10-CM | POA: Diagnosis not present

## 2021-04-02 DIAGNOSIS — N50812 Left testicular pain: Secondary | ICD-10-CM | POA: Diagnosis not present

## 2021-04-04 DIAGNOSIS — Z992 Dependence on renal dialysis: Secondary | ICD-10-CM | POA: Diagnosis not present

## 2021-04-04 DIAGNOSIS — N2581 Secondary hyperparathyroidism of renal origin: Secondary | ICD-10-CM | POA: Diagnosis not present

## 2021-04-04 DIAGNOSIS — N186 End stage renal disease: Secondary | ICD-10-CM | POA: Diagnosis not present

## 2021-04-06 DIAGNOSIS — N2581 Secondary hyperparathyroidism of renal origin: Secondary | ICD-10-CM | POA: Diagnosis not present

## 2021-04-06 DIAGNOSIS — N186 End stage renal disease: Secondary | ICD-10-CM | POA: Diagnosis not present

## 2021-04-06 DIAGNOSIS — Z992 Dependence on renal dialysis: Secondary | ICD-10-CM | POA: Diagnosis not present

## 2021-04-09 DIAGNOSIS — Z992 Dependence on renal dialysis: Secondary | ICD-10-CM | POA: Diagnosis not present

## 2021-04-09 DIAGNOSIS — N186 End stage renal disease: Secondary | ICD-10-CM | POA: Diagnosis not present

## 2021-04-09 DIAGNOSIS — N2581 Secondary hyperparathyroidism of renal origin: Secondary | ICD-10-CM | POA: Diagnosis not present

## 2021-04-10 DIAGNOSIS — M009 Pyogenic arthritis, unspecified: Secondary | ICD-10-CM | POA: Diagnosis not present

## 2021-04-10 DIAGNOSIS — N186 End stage renal disease: Secondary | ICD-10-CM | POA: Diagnosis not present

## 2021-04-10 DIAGNOSIS — Z87891 Personal history of nicotine dependence: Secondary | ICD-10-CM | POA: Diagnosis not present

## 2021-04-10 DIAGNOSIS — N184 Chronic kidney disease, stage 4 (severe): Secondary | ICD-10-CM | POA: Diagnosis not present

## 2021-04-10 DIAGNOSIS — Z992 Dependence on renal dialysis: Secondary | ICD-10-CM | POA: Diagnosis not present

## 2021-04-11 DIAGNOSIS — N186 End stage renal disease: Secondary | ICD-10-CM | POA: Diagnosis not present

## 2021-04-11 DIAGNOSIS — N2581 Secondary hyperparathyroidism of renal origin: Secondary | ICD-10-CM | POA: Diagnosis not present

## 2021-04-11 DIAGNOSIS — Z992 Dependence on renal dialysis: Secondary | ICD-10-CM | POA: Diagnosis not present

## 2021-04-13 DIAGNOSIS — Z992 Dependence on renal dialysis: Secondary | ICD-10-CM | POA: Diagnosis not present

## 2021-04-13 DIAGNOSIS — N2581 Secondary hyperparathyroidism of renal origin: Secondary | ICD-10-CM | POA: Diagnosis not present

## 2021-04-13 DIAGNOSIS — N186 End stage renal disease: Secondary | ICD-10-CM | POA: Diagnosis not present

## 2021-04-16 DIAGNOSIS — N186 End stage renal disease: Secondary | ICD-10-CM | POA: Diagnosis not present

## 2021-04-16 DIAGNOSIS — Z992 Dependence on renal dialysis: Secondary | ICD-10-CM | POA: Diagnosis not present

## 2021-04-16 DIAGNOSIS — N2581 Secondary hyperparathyroidism of renal origin: Secondary | ICD-10-CM | POA: Diagnosis not present

## 2021-04-17 DIAGNOSIS — Z992 Dependence on renal dialysis: Secondary | ICD-10-CM | POA: Diagnosis not present

## 2021-04-17 DIAGNOSIS — N185 Chronic kidney disease, stage 5: Secondary | ICD-10-CM | POA: Diagnosis not present

## 2021-04-17 DIAGNOSIS — E039 Hypothyroidism, unspecified: Secondary | ICD-10-CM | POA: Diagnosis not present

## 2021-04-17 DIAGNOSIS — E1165 Type 2 diabetes mellitus with hyperglycemia: Secondary | ICD-10-CM | POA: Diagnosis not present

## 2021-04-17 DIAGNOSIS — I10 Essential (primary) hypertension: Secondary | ICD-10-CM | POA: Diagnosis not present

## 2021-04-17 DIAGNOSIS — G629 Polyneuropathy, unspecified: Secondary | ICD-10-CM | POA: Diagnosis not present

## 2021-04-17 DIAGNOSIS — E118 Type 2 diabetes mellitus with unspecified complications: Secondary | ICD-10-CM | POA: Diagnosis not present

## 2021-04-17 DIAGNOSIS — E782 Mixed hyperlipidemia: Secondary | ICD-10-CM | POA: Diagnosis not present

## 2021-04-18 DIAGNOSIS — N2581 Secondary hyperparathyroidism of renal origin: Secondary | ICD-10-CM | POA: Diagnosis not present

## 2021-04-18 DIAGNOSIS — N186 End stage renal disease: Secondary | ICD-10-CM | POA: Diagnosis not present

## 2021-04-18 DIAGNOSIS — Z992 Dependence on renal dialysis: Secondary | ICD-10-CM | POA: Diagnosis not present

## 2021-04-19 DIAGNOSIS — E669 Obesity, unspecified: Secondary | ICD-10-CM | POA: Diagnosis not present

## 2021-04-19 DIAGNOSIS — M1A09X0 Idiopathic chronic gout, multiple sites, without tophus (tophi): Secondary | ICD-10-CM | POA: Diagnosis not present

## 2021-04-19 DIAGNOSIS — I509 Heart failure, unspecified: Secondary | ICD-10-CM | POA: Diagnosis not present

## 2021-04-19 DIAGNOSIS — N186 End stage renal disease: Secondary | ICD-10-CM | POA: Diagnosis not present

## 2021-04-19 DIAGNOSIS — M150 Primary generalized (osteo)arthritis: Secondary | ICD-10-CM | POA: Diagnosis not present

## 2021-04-19 DIAGNOSIS — M25522 Pain in left elbow: Secondary | ICD-10-CM | POA: Diagnosis not present

## 2021-04-19 DIAGNOSIS — Z6834 Body mass index (BMI) 34.0-34.9, adult: Secondary | ICD-10-CM | POA: Diagnosis not present

## 2021-04-19 DIAGNOSIS — M5489 Other dorsalgia: Secondary | ICD-10-CM | POA: Diagnosis not present

## 2021-04-19 DIAGNOSIS — H543 Unqualified visual loss, both eyes: Secondary | ICD-10-CM | POA: Diagnosis not present

## 2021-04-19 DIAGNOSIS — M25531 Pain in right wrist: Secondary | ICD-10-CM | POA: Diagnosis not present

## 2021-04-20 DIAGNOSIS — N186 End stage renal disease: Secondary | ICD-10-CM | POA: Diagnosis not present

## 2021-04-20 DIAGNOSIS — N2581 Secondary hyperparathyroidism of renal origin: Secondary | ICD-10-CM | POA: Diagnosis not present

## 2021-04-20 DIAGNOSIS — Z992 Dependence on renal dialysis: Secondary | ICD-10-CM | POA: Diagnosis not present

## 2021-04-23 DIAGNOSIS — N186 End stage renal disease: Secondary | ICD-10-CM | POA: Diagnosis not present

## 2021-04-23 DIAGNOSIS — Z992 Dependence on renal dialysis: Secondary | ICD-10-CM | POA: Diagnosis not present

## 2021-04-23 DIAGNOSIS — N2581 Secondary hyperparathyroidism of renal origin: Secondary | ICD-10-CM | POA: Diagnosis not present

## 2021-04-24 DIAGNOSIS — N186 End stage renal disease: Secondary | ICD-10-CM | POA: Diagnosis not present

## 2021-04-24 DIAGNOSIS — T8620 Unspecified complication of heart transplant: Secondary | ICD-10-CM | POA: Diagnosis not present

## 2021-04-24 DIAGNOSIS — Z992 Dependence on renal dialysis: Secondary | ICD-10-CM | POA: Diagnosis not present

## 2021-04-25 DIAGNOSIS — Z992 Dependence on renal dialysis: Secondary | ICD-10-CM | POA: Diagnosis not present

## 2021-04-25 DIAGNOSIS — H463 Toxic optic neuropathy: Secondary | ICD-10-CM | POA: Diagnosis not present

## 2021-04-25 DIAGNOSIS — H53413 Scotoma involving central area, bilateral: Secondary | ICD-10-CM | POA: Diagnosis not present

## 2021-04-25 DIAGNOSIS — N2581 Secondary hyperparathyroidism of renal origin: Secondary | ICD-10-CM | POA: Diagnosis not present

## 2021-04-25 DIAGNOSIS — N186 End stage renal disease: Secondary | ICD-10-CM | POA: Diagnosis not present

## 2021-04-27 DIAGNOSIS — Z992 Dependence on renal dialysis: Secondary | ICD-10-CM | POA: Diagnosis not present

## 2021-04-27 DIAGNOSIS — N186 End stage renal disease: Secondary | ICD-10-CM | POA: Diagnosis not present

## 2021-04-27 DIAGNOSIS — N2581 Secondary hyperparathyroidism of renal origin: Secondary | ICD-10-CM | POA: Diagnosis not present

## 2021-04-29 DIAGNOSIS — H2511 Age-related nuclear cataract, right eye: Secondary | ICD-10-CM | POA: Diagnosis not present

## 2021-04-29 DIAGNOSIS — H53413 Scotoma involving central area, bilateral: Secondary | ICD-10-CM | POA: Diagnosis not present

## 2021-04-29 DIAGNOSIS — H25013 Cortical age-related cataract, bilateral: Secondary | ICD-10-CM | POA: Diagnosis not present

## 2021-04-30 DIAGNOSIS — N186 End stage renal disease: Secondary | ICD-10-CM | POA: Diagnosis not present

## 2021-04-30 DIAGNOSIS — Z992 Dependence on renal dialysis: Secondary | ICD-10-CM | POA: Diagnosis not present

## 2021-04-30 DIAGNOSIS — N2581 Secondary hyperparathyroidism of renal origin: Secondary | ICD-10-CM | POA: Diagnosis not present

## 2021-05-02 DIAGNOSIS — N186 End stage renal disease: Secondary | ICD-10-CM | POA: Diagnosis not present

## 2021-05-02 DIAGNOSIS — N2581 Secondary hyperparathyroidism of renal origin: Secondary | ICD-10-CM | POA: Diagnosis not present

## 2021-05-02 DIAGNOSIS — Z992 Dependence on renal dialysis: Secondary | ICD-10-CM | POA: Diagnosis not present

## 2021-05-03 DIAGNOSIS — R2231 Localized swelling, mass and lump, right upper limb: Secondary | ICD-10-CM | POA: Diagnosis not present

## 2021-05-04 DIAGNOSIS — Z992 Dependence on renal dialysis: Secondary | ICD-10-CM | POA: Diagnosis not present

## 2021-05-04 DIAGNOSIS — N2581 Secondary hyperparathyroidism of renal origin: Secondary | ICD-10-CM | POA: Diagnosis not present

## 2021-05-04 DIAGNOSIS — N186 End stage renal disease: Secondary | ICD-10-CM | POA: Diagnosis not present

## 2021-05-07 DIAGNOSIS — Z992 Dependence on renal dialysis: Secondary | ICD-10-CM | POA: Diagnosis not present

## 2021-05-07 DIAGNOSIS — N186 End stage renal disease: Secondary | ICD-10-CM | POA: Diagnosis not present

## 2021-05-07 DIAGNOSIS — N2581 Secondary hyperparathyroidism of renal origin: Secondary | ICD-10-CM | POA: Diagnosis not present

## 2021-05-09 DIAGNOSIS — N2581 Secondary hyperparathyroidism of renal origin: Secondary | ICD-10-CM | POA: Diagnosis not present

## 2021-05-09 DIAGNOSIS — Z992 Dependence on renal dialysis: Secondary | ICD-10-CM | POA: Diagnosis not present

## 2021-05-09 DIAGNOSIS — N186 End stage renal disease: Secondary | ICD-10-CM | POA: Diagnosis not present

## 2021-05-11 DIAGNOSIS — N186 End stage renal disease: Secondary | ICD-10-CM | POA: Diagnosis not present

## 2021-05-11 DIAGNOSIS — Z992 Dependence on renal dialysis: Secondary | ICD-10-CM | POA: Diagnosis not present

## 2021-05-11 DIAGNOSIS — N2581 Secondary hyperparathyroidism of renal origin: Secondary | ICD-10-CM | POA: Diagnosis not present

## 2021-05-14 DIAGNOSIS — N2581 Secondary hyperparathyroidism of renal origin: Secondary | ICD-10-CM | POA: Diagnosis not present

## 2021-05-14 DIAGNOSIS — N186 End stage renal disease: Secondary | ICD-10-CM | POA: Diagnosis not present

## 2021-05-14 DIAGNOSIS — Z992 Dependence on renal dialysis: Secondary | ICD-10-CM | POA: Diagnosis not present

## 2021-05-16 DIAGNOSIS — E1140 Type 2 diabetes mellitus with diabetic neuropathy, unspecified: Secondary | ICD-10-CM | POA: Diagnosis not present

## 2021-05-16 DIAGNOSIS — N186 End stage renal disease: Secondary | ICD-10-CM | POA: Diagnosis not present

## 2021-05-16 DIAGNOSIS — G894 Chronic pain syndrome: Secondary | ICD-10-CM | POA: Diagnosis not present

## 2021-05-16 DIAGNOSIS — N2581 Secondary hyperparathyroidism of renal origin: Secondary | ICD-10-CM | POA: Diagnosis not present

## 2021-05-16 DIAGNOSIS — Z992 Dependence on renal dialysis: Secondary | ICD-10-CM | POA: Diagnosis not present

## 2021-05-18 DIAGNOSIS — N186 End stage renal disease: Secondary | ICD-10-CM | POA: Diagnosis not present

## 2021-05-18 DIAGNOSIS — Z992 Dependence on renal dialysis: Secondary | ICD-10-CM | POA: Diagnosis not present

## 2021-05-18 DIAGNOSIS — N2581 Secondary hyperparathyroidism of renal origin: Secondary | ICD-10-CM | POA: Diagnosis not present

## 2021-05-20 DIAGNOSIS — I861 Scrotal varices: Secondary | ICD-10-CM | POA: Diagnosis not present

## 2021-05-20 DIAGNOSIS — N50812 Left testicular pain: Secondary | ICD-10-CM | POA: Diagnosis not present

## 2021-05-20 DIAGNOSIS — N50811 Right testicular pain: Secondary | ICD-10-CM | POA: Diagnosis not present

## 2021-05-21 DIAGNOSIS — Z992 Dependence on renal dialysis: Secondary | ICD-10-CM | POA: Diagnosis not present

## 2021-05-21 DIAGNOSIS — N2581 Secondary hyperparathyroidism of renal origin: Secondary | ICD-10-CM | POA: Diagnosis not present

## 2021-05-21 DIAGNOSIS — N186 End stage renal disease: Secondary | ICD-10-CM | POA: Diagnosis not present

## 2021-05-23 DIAGNOSIS — Z992 Dependence on renal dialysis: Secondary | ICD-10-CM | POA: Diagnosis not present

## 2021-05-23 DIAGNOSIS — N186 End stage renal disease: Secondary | ICD-10-CM | POA: Diagnosis not present

## 2021-05-23 DIAGNOSIS — N2581 Secondary hyperparathyroidism of renal origin: Secondary | ICD-10-CM | POA: Diagnosis not present

## 2021-05-24 DIAGNOSIS — Z992 Dependence on renal dialysis: Secondary | ICD-10-CM | POA: Diagnosis not present

## 2021-05-24 DIAGNOSIS — N186 End stage renal disease: Secondary | ICD-10-CM | POA: Diagnosis not present

## 2021-05-24 DIAGNOSIS — T8620 Unspecified complication of heart transplant: Secondary | ICD-10-CM | POA: Diagnosis not present

## 2021-05-25 DIAGNOSIS — D631 Anemia in chronic kidney disease: Secondary | ICD-10-CM | POA: Diagnosis not present

## 2021-05-25 DIAGNOSIS — T8249XD Other complication of vascular dialysis catheter, subsequent encounter: Secondary | ICD-10-CM | POA: Diagnosis not present

## 2021-05-25 DIAGNOSIS — D509 Iron deficiency anemia, unspecified: Secondary | ICD-10-CM | POA: Diagnosis not present

## 2021-05-25 DIAGNOSIS — N186 End stage renal disease: Secondary | ICD-10-CM | POA: Diagnosis not present

## 2021-05-25 DIAGNOSIS — Z23 Encounter for immunization: Secondary | ICD-10-CM | POA: Diagnosis not present

## 2021-05-25 DIAGNOSIS — N2581 Secondary hyperparathyroidism of renal origin: Secondary | ICD-10-CM | POA: Diagnosis not present

## 2021-05-25 DIAGNOSIS — Z992 Dependence on renal dialysis: Secondary | ICD-10-CM | POA: Diagnosis not present

## 2021-05-28 DIAGNOSIS — N2581 Secondary hyperparathyroidism of renal origin: Secondary | ICD-10-CM | POA: Diagnosis not present

## 2021-05-28 DIAGNOSIS — D631 Anemia in chronic kidney disease: Secondary | ICD-10-CM | POA: Diagnosis not present

## 2021-05-28 DIAGNOSIS — N186 End stage renal disease: Secondary | ICD-10-CM | POA: Diagnosis not present

## 2021-05-28 DIAGNOSIS — D509 Iron deficiency anemia, unspecified: Secondary | ICD-10-CM | POA: Diagnosis not present

## 2021-05-28 DIAGNOSIS — T8249XD Other complication of vascular dialysis catheter, subsequent encounter: Secondary | ICD-10-CM | POA: Diagnosis not present

## 2021-05-28 DIAGNOSIS — Z992 Dependence on renal dialysis: Secondary | ICD-10-CM | POA: Diagnosis not present

## 2021-05-30 DIAGNOSIS — N186 End stage renal disease: Secondary | ICD-10-CM | POA: Diagnosis not present

## 2021-05-30 DIAGNOSIS — D509 Iron deficiency anemia, unspecified: Secondary | ICD-10-CM | POA: Diagnosis not present

## 2021-05-30 DIAGNOSIS — N2581 Secondary hyperparathyroidism of renal origin: Secondary | ICD-10-CM | POA: Diagnosis not present

## 2021-05-30 DIAGNOSIS — E119 Type 2 diabetes mellitus without complications: Secondary | ICD-10-CM | POA: Diagnosis not present

## 2021-05-30 DIAGNOSIS — D631 Anemia in chronic kidney disease: Secondary | ICD-10-CM | POA: Diagnosis not present

## 2021-05-30 DIAGNOSIS — Z992 Dependence on renal dialysis: Secondary | ICD-10-CM | POA: Diagnosis not present

## 2021-05-30 DIAGNOSIS — T8249XD Other complication of vascular dialysis catheter, subsequent encounter: Secondary | ICD-10-CM | POA: Diagnosis not present

## 2021-06-01 DIAGNOSIS — D509 Iron deficiency anemia, unspecified: Secondary | ICD-10-CM | POA: Diagnosis not present

## 2021-06-01 DIAGNOSIS — D631 Anemia in chronic kidney disease: Secondary | ICD-10-CM | POA: Diagnosis not present

## 2021-06-01 DIAGNOSIS — Z992 Dependence on renal dialysis: Secondary | ICD-10-CM | POA: Diagnosis not present

## 2021-06-01 DIAGNOSIS — T8249XD Other complication of vascular dialysis catheter, subsequent encounter: Secondary | ICD-10-CM | POA: Diagnosis not present

## 2021-06-01 DIAGNOSIS — N2581 Secondary hyperparathyroidism of renal origin: Secondary | ICD-10-CM | POA: Diagnosis not present

## 2021-06-01 DIAGNOSIS — N186 End stage renal disease: Secondary | ICD-10-CM | POA: Diagnosis not present

## 2021-06-03 DIAGNOSIS — T8249XD Other complication of vascular dialysis catheter, subsequent encounter: Secondary | ICD-10-CM | POA: Diagnosis not present

## 2021-06-03 DIAGNOSIS — N2581 Secondary hyperparathyroidism of renal origin: Secondary | ICD-10-CM | POA: Diagnosis not present

## 2021-06-03 DIAGNOSIS — Z992 Dependence on renal dialysis: Secondary | ICD-10-CM | POA: Diagnosis not present

## 2021-06-03 DIAGNOSIS — N186 End stage renal disease: Secondary | ICD-10-CM | POA: Diagnosis not present

## 2021-06-03 DIAGNOSIS — D509 Iron deficiency anemia, unspecified: Secondary | ICD-10-CM | POA: Diagnosis not present

## 2021-06-03 DIAGNOSIS — D631 Anemia in chronic kidney disease: Secondary | ICD-10-CM | POA: Diagnosis not present

## 2021-06-04 DIAGNOSIS — H25811 Combined forms of age-related cataract, right eye: Secondary | ICD-10-CM | POA: Diagnosis not present

## 2021-06-04 DIAGNOSIS — E119 Type 2 diabetes mellitus without complications: Secondary | ICD-10-CM | POA: Diagnosis not present

## 2021-06-04 DIAGNOSIS — I10 Essential (primary) hypertension: Secondary | ICD-10-CM | POA: Diagnosis not present

## 2021-06-06 DIAGNOSIS — D509 Iron deficiency anemia, unspecified: Secondary | ICD-10-CM | POA: Diagnosis not present

## 2021-06-06 DIAGNOSIS — N186 End stage renal disease: Secondary | ICD-10-CM | POA: Diagnosis not present

## 2021-06-06 DIAGNOSIS — Z992 Dependence on renal dialysis: Secondary | ICD-10-CM | POA: Diagnosis not present

## 2021-06-06 DIAGNOSIS — D631 Anemia in chronic kidney disease: Secondary | ICD-10-CM | POA: Diagnosis not present

## 2021-06-06 DIAGNOSIS — N2581 Secondary hyperparathyroidism of renal origin: Secondary | ICD-10-CM | POA: Diagnosis not present

## 2021-06-06 DIAGNOSIS — T8249XD Other complication of vascular dialysis catheter, subsequent encounter: Secondary | ICD-10-CM | POA: Diagnosis not present

## 2021-06-08 DIAGNOSIS — Z992 Dependence on renal dialysis: Secondary | ICD-10-CM | POA: Diagnosis not present

## 2021-06-08 DIAGNOSIS — D631 Anemia in chronic kidney disease: Secondary | ICD-10-CM | POA: Diagnosis not present

## 2021-06-08 DIAGNOSIS — T8249XD Other complication of vascular dialysis catheter, subsequent encounter: Secondary | ICD-10-CM | POA: Diagnosis not present

## 2021-06-08 DIAGNOSIS — N186 End stage renal disease: Secondary | ICD-10-CM | POA: Diagnosis not present

## 2021-06-08 DIAGNOSIS — D509 Iron deficiency anemia, unspecified: Secondary | ICD-10-CM | POA: Diagnosis not present

## 2021-06-08 DIAGNOSIS — N2581 Secondary hyperparathyroidism of renal origin: Secondary | ICD-10-CM | POA: Diagnosis not present

## 2021-06-11 DIAGNOSIS — D509 Iron deficiency anemia, unspecified: Secondary | ICD-10-CM | POA: Diagnosis not present

## 2021-06-11 DIAGNOSIS — D631 Anemia in chronic kidney disease: Secondary | ICD-10-CM | POA: Diagnosis not present

## 2021-06-11 DIAGNOSIS — N2581 Secondary hyperparathyroidism of renal origin: Secondary | ICD-10-CM | POA: Diagnosis not present

## 2021-06-11 DIAGNOSIS — T8249XD Other complication of vascular dialysis catheter, subsequent encounter: Secondary | ICD-10-CM | POA: Diagnosis not present

## 2021-06-11 DIAGNOSIS — N186 End stage renal disease: Secondary | ICD-10-CM | POA: Diagnosis not present

## 2021-06-11 DIAGNOSIS — Z992 Dependence on renal dialysis: Secondary | ICD-10-CM | POA: Diagnosis not present

## 2021-06-12 DIAGNOSIS — I120 Hypertensive chronic kidney disease with stage 5 chronic kidney disease or end stage renal disease: Secondary | ICD-10-CM | POA: Diagnosis not present

## 2021-06-12 DIAGNOSIS — Z4821 Encounter for aftercare following heart transplant: Secondary | ICD-10-CM | POA: Diagnosis not present

## 2021-06-12 DIAGNOSIS — E1136 Type 2 diabetes mellitus with diabetic cataract: Secondary | ICD-10-CM | POA: Diagnosis not present

## 2021-06-12 DIAGNOSIS — D849 Immunodeficiency, unspecified: Secondary | ICD-10-CM | POA: Diagnosis not present

## 2021-06-12 DIAGNOSIS — G4733 Obstructive sleep apnea (adult) (pediatric): Secondary | ICD-10-CM | POA: Diagnosis not present

## 2021-06-12 DIAGNOSIS — Z941 Heart transplant status: Secondary | ICD-10-CM | POA: Diagnosis not present

## 2021-06-12 DIAGNOSIS — Z79899 Other long term (current) drug therapy: Secondary | ICD-10-CM | POA: Diagnosis not present

## 2021-06-12 DIAGNOSIS — N186 End stage renal disease: Secondary | ICD-10-CM | POA: Diagnosis not present

## 2021-06-12 DIAGNOSIS — E1122 Type 2 diabetes mellitus with diabetic chronic kidney disease: Secondary | ICD-10-CM | POA: Diagnosis not present

## 2021-06-12 DIAGNOSIS — E039 Hypothyroidism, unspecified: Secondary | ICD-10-CM | POA: Diagnosis not present

## 2021-06-12 DIAGNOSIS — Z794 Long term (current) use of insulin: Secondary | ICD-10-CM | POA: Diagnosis not present

## 2021-06-12 DIAGNOSIS — D84821 Immunodeficiency due to drugs: Secondary | ICD-10-CM | POA: Diagnosis not present

## 2021-06-12 DIAGNOSIS — Z23 Encounter for immunization: Secondary | ICD-10-CM | POA: Diagnosis not present

## 2021-06-12 DIAGNOSIS — Z7982 Long term (current) use of aspirin: Secondary | ICD-10-CM | POA: Diagnosis not present

## 2021-06-12 DIAGNOSIS — A310 Pulmonary mycobacterial infection: Secondary | ICD-10-CM | POA: Diagnosis not present

## 2021-06-12 DIAGNOSIS — M109 Gout, unspecified: Secondary | ICD-10-CM | POA: Diagnosis not present

## 2021-06-12 DIAGNOSIS — Z87891 Personal history of nicotine dependence: Secondary | ICD-10-CM | POA: Diagnosis not present

## 2021-06-12 DIAGNOSIS — M00822 Arthritis due to other bacteria, left elbow: Secondary | ICD-10-CM | POA: Diagnosis not present

## 2021-06-12 DIAGNOSIS — I272 Pulmonary hypertension, unspecified: Secondary | ICD-10-CM | POA: Diagnosis not present

## 2021-06-12 DIAGNOSIS — Z992 Dependence on renal dialysis: Secondary | ICD-10-CM | POA: Diagnosis not present

## 2021-06-13 DIAGNOSIS — Z992 Dependence on renal dialysis: Secondary | ICD-10-CM | POA: Diagnosis not present

## 2021-06-13 DIAGNOSIS — D631 Anemia in chronic kidney disease: Secondary | ICD-10-CM | POA: Diagnosis not present

## 2021-06-13 DIAGNOSIS — T8249XD Other complication of vascular dialysis catheter, subsequent encounter: Secondary | ICD-10-CM | POA: Diagnosis not present

## 2021-06-13 DIAGNOSIS — N186 End stage renal disease: Secondary | ICD-10-CM | POA: Diagnosis not present

## 2021-06-13 DIAGNOSIS — N2581 Secondary hyperparathyroidism of renal origin: Secondary | ICD-10-CM | POA: Diagnosis not present

## 2021-06-13 DIAGNOSIS — D509 Iron deficiency anemia, unspecified: Secondary | ICD-10-CM | POA: Diagnosis not present

## 2021-06-15 DIAGNOSIS — D509 Iron deficiency anemia, unspecified: Secondary | ICD-10-CM | POA: Diagnosis not present

## 2021-06-15 DIAGNOSIS — N186 End stage renal disease: Secondary | ICD-10-CM | POA: Diagnosis not present

## 2021-06-15 DIAGNOSIS — T8249XD Other complication of vascular dialysis catheter, subsequent encounter: Secondary | ICD-10-CM | POA: Diagnosis not present

## 2021-06-15 DIAGNOSIS — Z992 Dependence on renal dialysis: Secondary | ICD-10-CM | POA: Diagnosis not present

## 2021-06-15 DIAGNOSIS — N2581 Secondary hyperparathyroidism of renal origin: Secondary | ICD-10-CM | POA: Diagnosis not present

## 2021-06-15 DIAGNOSIS — D631 Anemia in chronic kidney disease: Secondary | ICD-10-CM | POA: Diagnosis not present

## 2021-06-17 DIAGNOSIS — D631 Anemia in chronic kidney disease: Secondary | ICD-10-CM | POA: Diagnosis not present

## 2021-06-17 DIAGNOSIS — N186 End stage renal disease: Secondary | ICD-10-CM | POA: Diagnosis not present

## 2021-06-17 DIAGNOSIS — Z992 Dependence on renal dialysis: Secondary | ICD-10-CM | POA: Diagnosis not present

## 2021-06-17 DIAGNOSIS — T8249XD Other complication of vascular dialysis catheter, subsequent encounter: Secondary | ICD-10-CM | POA: Diagnosis not present

## 2021-06-17 DIAGNOSIS — D509 Iron deficiency anemia, unspecified: Secondary | ICD-10-CM | POA: Diagnosis not present

## 2021-06-17 DIAGNOSIS — N2581 Secondary hyperparathyroidism of renal origin: Secondary | ICD-10-CM | POA: Diagnosis not present

## 2021-06-18 DIAGNOSIS — H25012 Cortical age-related cataract, left eye: Secondary | ICD-10-CM | POA: Diagnosis not present

## 2021-06-20 DIAGNOSIS — N2581 Secondary hyperparathyroidism of renal origin: Secondary | ICD-10-CM | POA: Diagnosis not present

## 2021-06-20 DIAGNOSIS — T8249XD Other complication of vascular dialysis catheter, subsequent encounter: Secondary | ICD-10-CM | POA: Diagnosis not present

## 2021-06-20 DIAGNOSIS — Z992 Dependence on renal dialysis: Secondary | ICD-10-CM | POA: Diagnosis not present

## 2021-06-20 DIAGNOSIS — D631 Anemia in chronic kidney disease: Secondary | ICD-10-CM | POA: Diagnosis not present

## 2021-06-20 DIAGNOSIS — N186 End stage renal disease: Secondary | ICD-10-CM | POA: Diagnosis not present

## 2021-06-20 DIAGNOSIS — D509 Iron deficiency anemia, unspecified: Secondary | ICD-10-CM | POA: Diagnosis not present

## 2021-06-22 DIAGNOSIS — D631 Anemia in chronic kidney disease: Secondary | ICD-10-CM | POA: Diagnosis not present

## 2021-06-22 DIAGNOSIS — N186 End stage renal disease: Secondary | ICD-10-CM | POA: Diagnosis not present

## 2021-06-22 DIAGNOSIS — Z992 Dependence on renal dialysis: Secondary | ICD-10-CM | POA: Diagnosis not present

## 2021-06-22 DIAGNOSIS — T8249XD Other complication of vascular dialysis catheter, subsequent encounter: Secondary | ICD-10-CM | POA: Diagnosis not present

## 2021-06-22 DIAGNOSIS — D509 Iron deficiency anemia, unspecified: Secondary | ICD-10-CM | POA: Diagnosis not present

## 2021-06-22 DIAGNOSIS — N2581 Secondary hyperparathyroidism of renal origin: Secondary | ICD-10-CM | POA: Diagnosis not present

## 2021-06-24 DIAGNOSIS — N189 Chronic kidney disease, unspecified: Secondary | ICD-10-CM | POA: Diagnosis not present

## 2021-06-24 DIAGNOSIS — D631 Anemia in chronic kidney disease: Secondary | ICD-10-CM | POA: Diagnosis not present

## 2021-06-24 DIAGNOSIS — E1122 Type 2 diabetes mellitus with diabetic chronic kidney disease: Secondary | ICD-10-CM | POA: Diagnosis not present

## 2021-06-24 DIAGNOSIS — Z992 Dependence on renal dialysis: Secondary | ICD-10-CM | POA: Diagnosis not present

## 2021-06-24 DIAGNOSIS — M795 Residual foreign body in soft tissue: Secondary | ICD-10-CM | POA: Diagnosis not present

## 2021-06-24 DIAGNOSIS — T8249XD Other complication of vascular dialysis catheter, subsequent encounter: Secondary | ICD-10-CM | POA: Diagnosis not present

## 2021-06-24 DIAGNOSIS — N2581 Secondary hyperparathyroidism of renal origin: Secondary | ICD-10-CM | POA: Diagnosis not present

## 2021-06-24 DIAGNOSIS — R2231 Localized swelling, mass and lump, right upper limb: Secondary | ICD-10-CM | POA: Diagnosis not present

## 2021-06-24 DIAGNOSIS — M60241 Foreign body granuloma of soft tissue, not elsewhere classified, right hand: Secondary | ICD-10-CM | POA: Diagnosis not present

## 2021-06-24 DIAGNOSIS — T8620 Unspecified complication of heart transplant: Secondary | ICD-10-CM | POA: Diagnosis not present

## 2021-06-24 DIAGNOSIS — L928 Other granulomatous disorders of the skin and subcutaneous tissue: Secondary | ICD-10-CM | POA: Diagnosis not present

## 2021-06-24 DIAGNOSIS — I129 Hypertensive chronic kidney disease with stage 1 through stage 4 chronic kidney disease, or unspecified chronic kidney disease: Secondary | ICD-10-CM | POA: Diagnosis not present

## 2021-06-24 DIAGNOSIS — D509 Iron deficiency anemia, unspecified: Secondary | ICD-10-CM | POA: Diagnosis not present

## 2021-06-24 DIAGNOSIS — N186 End stage renal disease: Secondary | ICD-10-CM | POA: Diagnosis not present

## 2021-06-25 DIAGNOSIS — D509 Iron deficiency anemia, unspecified: Secondary | ICD-10-CM | POA: Diagnosis not present

## 2021-06-25 DIAGNOSIS — Z992 Dependence on renal dialysis: Secondary | ICD-10-CM | POA: Diagnosis not present

## 2021-06-25 DIAGNOSIS — N186 End stage renal disease: Secondary | ICD-10-CM | POA: Diagnosis not present

## 2021-06-25 DIAGNOSIS — N2581 Secondary hyperparathyroidism of renal origin: Secondary | ICD-10-CM | POA: Diagnosis not present

## 2021-06-25 DIAGNOSIS — Z23 Encounter for immunization: Secondary | ICD-10-CM | POA: Diagnosis not present

## 2021-06-25 DIAGNOSIS — T8249XD Other complication of vascular dialysis catheter, subsequent encounter: Secondary | ICD-10-CM | POA: Diagnosis not present

## 2021-06-25 DIAGNOSIS — D631 Anemia in chronic kidney disease: Secondary | ICD-10-CM | POA: Diagnosis not present

## 2021-06-27 DIAGNOSIS — N2581 Secondary hyperparathyroidism of renal origin: Secondary | ICD-10-CM | POA: Diagnosis not present

## 2021-06-27 DIAGNOSIS — N186 End stage renal disease: Secondary | ICD-10-CM | POA: Diagnosis not present

## 2021-06-27 DIAGNOSIS — D509 Iron deficiency anemia, unspecified: Secondary | ICD-10-CM | POA: Diagnosis not present

## 2021-06-27 DIAGNOSIS — D631 Anemia in chronic kidney disease: Secondary | ICD-10-CM | POA: Diagnosis not present

## 2021-06-27 DIAGNOSIS — T8249XD Other complication of vascular dialysis catheter, subsequent encounter: Secondary | ICD-10-CM | POA: Diagnosis not present

## 2021-06-27 DIAGNOSIS — Z992 Dependence on renal dialysis: Secondary | ICD-10-CM | POA: Diagnosis not present

## 2021-06-28 DIAGNOSIS — E1342 Other specified diabetes mellitus with diabetic polyneuropathy: Secondary | ICD-10-CM | POA: Diagnosis not present

## 2021-06-29 DIAGNOSIS — N2581 Secondary hyperparathyroidism of renal origin: Secondary | ICD-10-CM | POA: Diagnosis not present

## 2021-06-29 DIAGNOSIS — T8249XD Other complication of vascular dialysis catheter, subsequent encounter: Secondary | ICD-10-CM | POA: Diagnosis not present

## 2021-06-29 DIAGNOSIS — Z992 Dependence on renal dialysis: Secondary | ICD-10-CM | POA: Diagnosis not present

## 2021-06-29 DIAGNOSIS — N186 End stage renal disease: Secondary | ICD-10-CM | POA: Diagnosis not present

## 2021-06-29 DIAGNOSIS — D631 Anemia in chronic kidney disease: Secondary | ICD-10-CM | POA: Diagnosis not present

## 2021-06-29 DIAGNOSIS — D509 Iron deficiency anemia, unspecified: Secondary | ICD-10-CM | POA: Diagnosis not present

## 2021-07-02 DIAGNOSIS — D631 Anemia in chronic kidney disease: Secondary | ICD-10-CM | POA: Diagnosis not present

## 2021-07-02 DIAGNOSIS — D509 Iron deficiency anemia, unspecified: Secondary | ICD-10-CM | POA: Diagnosis not present

## 2021-07-02 DIAGNOSIS — Z992 Dependence on renal dialysis: Secondary | ICD-10-CM | POA: Diagnosis not present

## 2021-07-02 DIAGNOSIS — N186 End stage renal disease: Secondary | ICD-10-CM | POA: Diagnosis not present

## 2021-07-02 DIAGNOSIS — N2581 Secondary hyperparathyroidism of renal origin: Secondary | ICD-10-CM | POA: Diagnosis not present

## 2021-07-02 DIAGNOSIS — T8249XD Other complication of vascular dialysis catheter, subsequent encounter: Secondary | ICD-10-CM | POA: Diagnosis not present

## 2021-07-03 DIAGNOSIS — R2231 Localized swelling, mass and lump, right upper limb: Secondary | ICD-10-CM | POA: Diagnosis not present

## 2021-07-03 DIAGNOSIS — E1340 Other specified diabetes mellitus with diabetic neuropathy, unspecified: Secondary | ICD-10-CM | POA: Diagnosis not present

## 2021-07-03 DIAGNOSIS — G894 Chronic pain syndrome: Secondary | ICD-10-CM | POA: Diagnosis not present

## 2021-07-03 DIAGNOSIS — E1140 Type 2 diabetes mellitus with diabetic neuropathy, unspecified: Secondary | ICD-10-CM | POA: Diagnosis not present

## 2021-07-03 DIAGNOSIS — H463 Toxic optic neuropathy: Secondary | ICD-10-CM | POA: Diagnosis not present

## 2021-07-03 DIAGNOSIS — Z961 Presence of intraocular lens: Secondary | ICD-10-CM | POA: Diagnosis not present

## 2021-07-04 DIAGNOSIS — N186 End stage renal disease: Secondary | ICD-10-CM | POA: Diagnosis not present

## 2021-07-04 DIAGNOSIS — D509 Iron deficiency anemia, unspecified: Secondary | ICD-10-CM | POA: Diagnosis not present

## 2021-07-04 DIAGNOSIS — Z992 Dependence on renal dialysis: Secondary | ICD-10-CM | POA: Diagnosis not present

## 2021-07-04 DIAGNOSIS — D631 Anemia in chronic kidney disease: Secondary | ICD-10-CM | POA: Diagnosis not present

## 2021-07-04 DIAGNOSIS — N2581 Secondary hyperparathyroidism of renal origin: Secondary | ICD-10-CM | POA: Diagnosis not present

## 2021-07-04 DIAGNOSIS — T8249XD Other complication of vascular dialysis catheter, subsequent encounter: Secondary | ICD-10-CM | POA: Diagnosis not present

## 2021-07-06 DIAGNOSIS — T8249XD Other complication of vascular dialysis catheter, subsequent encounter: Secondary | ICD-10-CM | POA: Diagnosis not present

## 2021-07-06 DIAGNOSIS — Z992 Dependence on renal dialysis: Secondary | ICD-10-CM | POA: Diagnosis not present

## 2021-07-06 DIAGNOSIS — N2581 Secondary hyperparathyroidism of renal origin: Secondary | ICD-10-CM | POA: Diagnosis not present

## 2021-07-06 DIAGNOSIS — N186 End stage renal disease: Secondary | ICD-10-CM | POA: Diagnosis not present

## 2021-07-06 DIAGNOSIS — D509 Iron deficiency anemia, unspecified: Secondary | ICD-10-CM | POA: Diagnosis not present

## 2021-07-06 DIAGNOSIS — D631 Anemia in chronic kidney disease: Secondary | ICD-10-CM | POA: Diagnosis not present

## 2021-07-09 DIAGNOSIS — N2581 Secondary hyperparathyroidism of renal origin: Secondary | ICD-10-CM | POA: Diagnosis not present

## 2021-07-09 DIAGNOSIS — Z992 Dependence on renal dialysis: Secondary | ICD-10-CM | POA: Diagnosis not present

## 2021-07-09 DIAGNOSIS — D631 Anemia in chronic kidney disease: Secondary | ICD-10-CM | POA: Diagnosis not present

## 2021-07-09 DIAGNOSIS — N186 End stage renal disease: Secondary | ICD-10-CM | POA: Diagnosis not present

## 2021-07-09 DIAGNOSIS — T8249XD Other complication of vascular dialysis catheter, subsequent encounter: Secondary | ICD-10-CM | POA: Diagnosis not present

## 2021-07-09 DIAGNOSIS — D509 Iron deficiency anemia, unspecified: Secondary | ICD-10-CM | POA: Diagnosis not present

## 2021-07-11 DIAGNOSIS — N186 End stage renal disease: Secondary | ICD-10-CM | POA: Diagnosis not present

## 2021-07-11 DIAGNOSIS — T8249XD Other complication of vascular dialysis catheter, subsequent encounter: Secondary | ICD-10-CM | POA: Diagnosis not present

## 2021-07-11 DIAGNOSIS — D509 Iron deficiency anemia, unspecified: Secondary | ICD-10-CM | POA: Diagnosis not present

## 2021-07-11 DIAGNOSIS — Z992 Dependence on renal dialysis: Secondary | ICD-10-CM | POA: Diagnosis not present

## 2021-07-11 DIAGNOSIS — N2581 Secondary hyperparathyroidism of renal origin: Secondary | ICD-10-CM | POA: Diagnosis not present

## 2021-07-11 DIAGNOSIS — D631 Anemia in chronic kidney disease: Secondary | ICD-10-CM | POA: Diagnosis not present

## 2021-07-13 DIAGNOSIS — N2581 Secondary hyperparathyroidism of renal origin: Secondary | ICD-10-CM | POA: Diagnosis not present

## 2021-07-13 DIAGNOSIS — N186 End stage renal disease: Secondary | ICD-10-CM | POA: Diagnosis not present

## 2021-07-13 DIAGNOSIS — D509 Iron deficiency anemia, unspecified: Secondary | ICD-10-CM | POA: Diagnosis not present

## 2021-07-13 DIAGNOSIS — Z992 Dependence on renal dialysis: Secondary | ICD-10-CM | POA: Diagnosis not present

## 2021-07-13 DIAGNOSIS — D631 Anemia in chronic kidney disease: Secondary | ICD-10-CM | POA: Diagnosis not present

## 2021-07-13 DIAGNOSIS — T8249XD Other complication of vascular dialysis catheter, subsequent encounter: Secondary | ICD-10-CM | POA: Diagnosis not present

## 2021-07-16 DIAGNOSIS — N2581 Secondary hyperparathyroidism of renal origin: Secondary | ICD-10-CM | POA: Diagnosis not present

## 2021-07-16 DIAGNOSIS — N186 End stage renal disease: Secondary | ICD-10-CM | POA: Diagnosis not present

## 2021-07-16 DIAGNOSIS — T8249XD Other complication of vascular dialysis catheter, subsequent encounter: Secondary | ICD-10-CM | POA: Diagnosis not present

## 2021-07-16 DIAGNOSIS — D509 Iron deficiency anemia, unspecified: Secondary | ICD-10-CM | POA: Diagnosis not present

## 2021-07-16 DIAGNOSIS — Z992 Dependence on renal dialysis: Secondary | ICD-10-CM | POA: Diagnosis not present

## 2021-07-16 DIAGNOSIS — D631 Anemia in chronic kidney disease: Secondary | ICD-10-CM | POA: Diagnosis not present

## 2021-07-17 DIAGNOSIS — Z4821 Encounter for aftercare following heart transplant: Secondary | ICD-10-CM | POA: Diagnosis not present

## 2021-07-17 DIAGNOSIS — I120 Hypertensive chronic kidney disease with stage 5 chronic kidney disease or end stage renal disease: Secondary | ICD-10-CM | POA: Diagnosis not present

## 2021-07-17 DIAGNOSIS — I340 Nonrheumatic mitral (valve) insufficiency: Secondary | ICD-10-CM | POA: Diagnosis not present

## 2021-07-17 DIAGNOSIS — Z941 Heart transplant status: Secondary | ICD-10-CM | POA: Diagnosis not present

## 2021-07-17 DIAGNOSIS — N186 End stage renal disease: Secondary | ICD-10-CM | POA: Diagnosis not present

## 2021-07-17 DIAGNOSIS — E1122 Type 2 diabetes mellitus with diabetic chronic kidney disease: Secondary | ICD-10-CM | POA: Diagnosis not present

## 2021-07-17 DIAGNOSIS — D84821 Immunodeficiency due to drugs: Secondary | ICD-10-CM | POA: Diagnosis not present

## 2021-07-17 DIAGNOSIS — Z125 Encounter for screening for malignant neoplasm of prostate: Secondary | ICD-10-CM | POA: Diagnosis not present

## 2021-07-17 DIAGNOSIS — A310 Pulmonary mycobacterial infection: Secondary | ICD-10-CM | POA: Diagnosis not present

## 2021-07-17 DIAGNOSIS — E039 Hypothyroidism, unspecified: Secondary | ICD-10-CM | POA: Diagnosis not present

## 2021-07-17 DIAGNOSIS — Z87891 Personal history of nicotine dependence: Secondary | ICD-10-CM | POA: Diagnosis not present

## 2021-07-17 DIAGNOSIS — M00822 Arthritis due to other bacteria, left elbow: Secondary | ICD-10-CM | POA: Diagnosis not present

## 2021-07-17 DIAGNOSIS — N185 Chronic kidney disease, stage 5: Secondary | ICD-10-CM | POA: Diagnosis not present

## 2021-07-17 DIAGNOSIS — Z48298 Encounter for aftercare following other organ transplant: Secondary | ICD-10-CM | POA: Diagnosis not present

## 2021-07-17 DIAGNOSIS — Z79899 Other long term (current) drug therapy: Secondary | ICD-10-CM | POA: Diagnosis not present

## 2021-07-17 DIAGNOSIS — Z992 Dependence on renal dialysis: Secondary | ICD-10-CM | POA: Diagnosis not present

## 2021-07-17 DIAGNOSIS — D849 Immunodeficiency, unspecified: Secondary | ICD-10-CM | POA: Diagnosis not present

## 2021-07-18 DIAGNOSIS — Z992 Dependence on renal dialysis: Secondary | ICD-10-CM | POA: Diagnosis not present

## 2021-07-18 DIAGNOSIS — N2581 Secondary hyperparathyroidism of renal origin: Secondary | ICD-10-CM | POA: Diagnosis not present

## 2021-07-18 DIAGNOSIS — D509 Iron deficiency anemia, unspecified: Secondary | ICD-10-CM | POA: Diagnosis not present

## 2021-07-18 DIAGNOSIS — D631 Anemia in chronic kidney disease: Secondary | ICD-10-CM | POA: Diagnosis not present

## 2021-07-18 DIAGNOSIS — T8249XD Other complication of vascular dialysis catheter, subsequent encounter: Secondary | ICD-10-CM | POA: Diagnosis not present

## 2021-07-18 DIAGNOSIS — N186 End stage renal disease: Secondary | ICD-10-CM | POA: Diagnosis not present

## 2021-07-19 DIAGNOSIS — M25522 Pain in left elbow: Secondary | ICD-10-CM | POA: Diagnosis not present

## 2021-07-19 DIAGNOSIS — Z6835 Body mass index (BMI) 35.0-35.9, adult: Secondary | ICD-10-CM | POA: Diagnosis not present

## 2021-07-19 DIAGNOSIS — M25531 Pain in right wrist: Secondary | ICD-10-CM | POA: Diagnosis not present

## 2021-07-19 DIAGNOSIS — I509 Heart failure, unspecified: Secondary | ICD-10-CM | POA: Diagnosis not present

## 2021-07-19 DIAGNOSIS — E669 Obesity, unspecified: Secondary | ICD-10-CM | POA: Diagnosis not present

## 2021-07-19 DIAGNOSIS — M5489 Other dorsalgia: Secondary | ICD-10-CM | POA: Diagnosis not present

## 2021-07-19 DIAGNOSIS — M150 Primary generalized (osteo)arthritis: Secondary | ICD-10-CM | POA: Diagnosis not present

## 2021-07-19 DIAGNOSIS — H543 Unqualified visual loss, both eyes: Secondary | ICD-10-CM | POA: Diagnosis not present

## 2021-07-19 DIAGNOSIS — M1A09X0 Idiopathic chronic gout, multiple sites, without tophus (tophi): Secondary | ICD-10-CM | POA: Diagnosis not present

## 2021-07-19 DIAGNOSIS — N186 End stage renal disease: Secondary | ICD-10-CM | POA: Diagnosis not present

## 2021-07-20 DIAGNOSIS — D509 Iron deficiency anemia, unspecified: Secondary | ICD-10-CM | POA: Diagnosis not present

## 2021-07-20 DIAGNOSIS — D631 Anemia in chronic kidney disease: Secondary | ICD-10-CM | POA: Diagnosis not present

## 2021-07-20 DIAGNOSIS — N2581 Secondary hyperparathyroidism of renal origin: Secondary | ICD-10-CM | POA: Diagnosis not present

## 2021-07-20 DIAGNOSIS — N186 End stage renal disease: Secondary | ICD-10-CM | POA: Diagnosis not present

## 2021-07-20 DIAGNOSIS — Z992 Dependence on renal dialysis: Secondary | ICD-10-CM | POA: Diagnosis not present

## 2021-07-20 DIAGNOSIS — T8249XD Other complication of vascular dialysis catheter, subsequent encounter: Secondary | ICD-10-CM | POA: Diagnosis not present

## 2021-07-23 DIAGNOSIS — N2581 Secondary hyperparathyroidism of renal origin: Secondary | ICD-10-CM | POA: Diagnosis not present

## 2021-07-23 DIAGNOSIS — D509 Iron deficiency anemia, unspecified: Secondary | ICD-10-CM | POA: Diagnosis not present

## 2021-07-23 DIAGNOSIS — N186 End stage renal disease: Secondary | ICD-10-CM | POA: Diagnosis not present

## 2021-07-23 DIAGNOSIS — Z992 Dependence on renal dialysis: Secondary | ICD-10-CM | POA: Diagnosis not present

## 2021-07-23 DIAGNOSIS — D631 Anemia in chronic kidney disease: Secondary | ICD-10-CM | POA: Diagnosis not present

## 2021-07-23 DIAGNOSIS — T8249XD Other complication of vascular dialysis catheter, subsequent encounter: Secondary | ICD-10-CM | POA: Diagnosis not present

## 2021-08-06 DIAGNOSIS — D849 Immunodeficiency, unspecified: Secondary | ICD-10-CM | POA: Diagnosis not present

## 2021-09-13 ENCOUNTER — Other Ambulatory Visit: Payer: Self-pay

## 2021-09-13 ENCOUNTER — Encounter (HOSPITAL_COMMUNITY): Payer: Self-pay | Admitting: Emergency Medicine

## 2021-09-13 ENCOUNTER — Emergency Department (HOSPITAL_COMMUNITY)
Admission: EM | Admit: 2021-09-13 | Discharge: 2021-09-14 | Disposition: A | Discharge status: Home / Self Care | Payer: Medicare Other | Attending: Emergency Medicine | Admitting: Emergency Medicine

## 2021-09-13 ENCOUNTER — Emergency Department (HOSPITAL_COMMUNITY): Payer: Medicare Other

## 2021-09-13 DIAGNOSIS — Z5321 Procedure and treatment not carried out due to patient leaving prior to being seen by health care provider: Secondary | ICD-10-CM | POA: Insufficient documentation

## 2021-09-13 DIAGNOSIS — S81801A Unspecified open wound, right lower leg, initial encounter: Secondary | ICD-10-CM | POA: Diagnosis not present

## 2021-09-13 DIAGNOSIS — W228XXA Striking against or struck by other objects, initial encounter: Secondary | ICD-10-CM | POA: Diagnosis not present

## 2021-09-13 LAB — BASIC METABOLIC PANEL
Anion gap: 14 (ref 5–15)
BUN: 56 mg/dL — ABNORMAL HIGH (ref 6–20)
CO2: 25 mmol/L (ref 22–32)
Calcium: 9.1 mg/dL (ref 8.9–10.3)
Chloride: 99 mmol/L (ref 98–111)
Creatinine, Ser: 12.57 mg/dL — ABNORMAL HIGH (ref 0.61–1.24)
GFR, Estimated: 4 mL/min — ABNORMAL LOW (ref >60)
Glucose, Bld: 159 mg/dL — ABNORMAL HIGH (ref 70–99)
Potassium: 4.9 mmol/L (ref 3.5–5.1)
Sodium: 138 mmol/L (ref 135–145)

## 2021-09-13 LAB — CBC
HCT: 34.2 % — ABNORMAL LOW (ref 39.0–52.0)
Hemoglobin: 11.2 g/dL — ABNORMAL LOW (ref 13.0–17.0)
MCH: 32.1 pg (ref 26.0–34.0)
MCHC: 32.7 g/dL (ref 30.0–36.0)
MCV: 98 fL (ref 80.0–100.0)
Platelets: 113 10*3/uL — ABNORMAL LOW (ref 150–400)
RBC: 3.49 MIL/uL — ABNORMAL LOW (ref 4.22–5.81)
RDW: 14.1 % (ref 11.5–15.5)
WBC: 5.9 10*3/uL (ref 4.0–10.5)
nRBC: 0 % (ref 0.0–0.2)

## 2021-09-13 NOTE — ED Provider Notes (Signed)
Emergency Medicine Provider Triage Evaluation Note  David Gregory , a 55 y.o. male  was evaluated in triage.  Pt complains of right lower leg wound.  The patient reports that he hit his right lower leg against a car door yesterday.  He reports that the wound was initially guessing blood so he applied a dressing.  When he removed it this morning, the wound continued to bleed.  He has had to change his dressing was multiple times today.  Due to continued bleeding, he presented to the emergency department for further evaluation.  He does not take any blood thinners, but he is on dialysis Monday, Wednesday, Friday, and he is concerned that they may have given him more heparin during his dialysis session yesterday, which may have caused him to bleed more.  He denies any right lower leg numbness or weakness, knee pain, ankle pain, dizziness, lightheadedness, chest pain, shortness of breath.  He has been compliant with his dialysis schedule.  Review of Systems  Positive: Wound Negative: Numbness, weakness, arthralgias, dizziness, lightheadedness, chest pain, shortness of breath  Physical Exam  BP 134/79 (BP Location: Right Arm)   Pulse 98   Temp 98.2 F (36.8 C) (Oral)   Resp 15   Ht 5\' 4"  (1.626 m)   Wt 95 kg   SpO2 95%   BMI 35.95 kg/m  Gen:   Awake, no distress   Resp:  Normal effort  MSK:   Moves extremities without difficulty  Other:  Punctate wound noted to the right lateral lower leg.  Wound is actively bleeding with  Medical Decision Making  Medically screening exam initiated at 10:45 PM.  Appropriate orders placed.  PRYCE FOLTS was informed that the remainder of the evaluation will be completed by another provider, this initial triage assessment does not replace that evaluation, and the importance of remaining in the ED until their evaluation is complete.  Will order basic labs given blood loss.  Since bleeding has been persistent for more than 24 hours, will get plain films of the  right lower leg for further evaluation of the soft tissues.  He will require further work-up and evaluation in the emergency department.   Joline Maxcy A, PA-C 09/13/21 2245    Gareth Morgan, MD 09/16/21 1811

## 2021-09-13 NOTE — ED Triage Notes (Signed)
Patient accidentally hit his right shin against the car door yesterday , reports persistent bleeding at wound , dressing applied prior to arrival.

## 2021-09-14 NOTE — ED Notes (Signed)
Pt told Network engineer he was leaving. MD informed pt eloped

## 2021-09-15 ENCOUNTER — Other Ambulatory Visit: Payer: Self-pay

## 2021-09-15 ENCOUNTER — Encounter: Payer: Self-pay | Admitting: Emergency Medicine

## 2021-09-15 ENCOUNTER — Ambulatory Visit
Admission: EM | Admit: 2021-09-15 | Discharge: 2021-09-15 | Disposition: A | Discharge status: Home / Self Care | Payer: Medicare Other | Attending: Internal Medicine | Admitting: Internal Medicine

## 2021-09-15 DIAGNOSIS — S81811A Laceration without foreign body, right lower leg, initial encounter: Secondary | ICD-10-CM

## 2021-09-15 MED ORDER — DOXYCYCLINE HYCLATE 100 MG PO CAPS: 100.0000 mg | ORAL_CAPSULE | Freq: Two times a day (BID) | ORAL | 0 refills | Status: DC

## 2021-09-15 NOTE — ED Provider Notes (Signed)
[facility]    CSN: [id number] Arrival date & time: 09/15/21  2563      History   Chief Complaint No chief complaint on file.   HPI David Gregory is a 55 y.o. male.   Patient presents with laceration to right lower leg that occurred approximately 4 days ago.  Patient reports that he hit his leg on a car door.  Patient went to the hospital on 09/13/2021 but left before being seen.  Patient is here today and is concerned because the area keeps having episodes of bleeding that lasts briefly and then resolves.  Patient denies taking blood thinners but does state that he takes a small amount of heparin in his PermCath for dialysis.  Patient does have end-stage renal disease where he was receives dialysis.  Patient denies any fevers, chills, body aches.  Patient denies noticing any purulent drainage.  Denies any numbness or tingling to leg.  Patient is able to bear weight.  Tetanus vaccine is up-to-date within the last 5 years per patient.    Past Medical History:  Diagnosis Date   Arthritis    "Every where"   Blood transfusion    with heart transplant   Carpal tunnel syndrome    Right   CHEST PAIN UNSPECIFIED    CONGESTIVE HEART FAILURE UNSPECIFIED    Diabetes mellitus    Diarrhea    Edema    ERECTILE DYSFUNCTION, ORGANIC    Gout, unspecified    Hypertension    Hypothyroidism    Renal insufficiency    pt reports levels have been at 4.8   SYSTOLIC HEART FAILURE, ACUTE ON CHRONIC    SYSTOLIC HEART FAILURE, CHRONIC    VENTRICULAR TACHYCARDIA     Patient Active Problem List   Diagnosis Date Noted   Healthcare maintenance 12/07/2020   S/P orthotopic heart transplant ([place]) 11/30/2015   CKD (chronic kidney disease) 11/30/2015   Morbid obesity ([place]) 06/16/2014   OSA (obstructive sleep apnea) 03/29/2012   Mass of finger of left hand 01/27/2012   PAH (pulmonary artery hypertension) ([place]) 08/06/2011   DIARRHEA 12/27/2010   CARPAL TUNNEL SYNDROME 06/26/2010    VENTRICULAR TACHYCARDIA 03/18/2010   GOUT WITH OTHER SPECIFIED MANIFESTATIONS 09/24/2009   GOUT, UNSPECIFIED 09/24/2009   TOBACCO ABUSE 89/37/3428   SYSTOLIC HEART FAILURE, CHRONIC 09/24/2009   ERECTILE DYSFUNCTION, ORGANIC 09/24/2009   EDEMA 09/24/2009    Past Surgical History:  Procedure Laterality Date   CARPAL TUNNEL RELEASE  09/06/2012   Procedure: CARPAL TUNNEL RELEASE;  Surgeon: Wynonia Sours, MD;  Location: [place];  Service: Orthopedics;  Laterality: Right;  FOREARM BLOCK   EYE SURGERY     "styl removed"   FINGER ARTHRODESIS  11/26/2012   Procedure: ARTHRODESIS FINGER;  Surgeon: Wynonia Sours, MD;  Location: [place];  Service: Orthopedics;  Laterality: Right;  EXCISION TOPHUS FUSION DIP (Right [place])    FRACTURE SURGERY     bilateral arms and l ankle fixed   HEART TRANSPLANT  feb/ 2012   Done at [facility] followed by Dr. Stann Mainland   MASS EXCISION  01/27/2012   Procedure: EXCISION MASS;  Surgeon: Mcarthur Rossetti, MD;  Location: [place];  Service: Orthopedics;  Laterality: Left;  Excision left middle finger mass   MASS EXCISION Left 08/13/2018   Procedure: EXCISION OF LEFT ELBOW MASS;  Surgeon: Charlotte Crumb, MD;  Location: [place];  Service: Orthopedics;  Laterality: Left;       Home  Medications    Prior to Admission medications   Medication Sig Start Date End Date Taking? Authorizing Provider  doxycycline (VIBRAMYCIN) 100 MG capsule Take 1 capsule (100 mg total) by mouth 2 (two) times daily. 09/15/21  Yes Chett Taniguchi, Hildred Alamin E, FNP  amoxicillin (AMOXIL) 500 MG capsule Take 500 mg by mouth daily.    [provider]  aspirin EC 81 MG tablet Take 81 mg by mouth daily.    [provider]  carvedilol (COREG) 25 MG tablet Take 25 mg by mouth 2 (two) times daily with a meal.     [provider]  cloNIDine (CATAPRES) 0.3 MG tablet Take 1 tablet by mouth 2 (two) times daily. 09/28/19   [provider]  colchicine  0.6 MG tablet Take 0.6 mg by mouth daily as needed. For gout    [provider]  febuxostat (ULORIC) 40 MG tablet Take 80 mg by mouth daily.    [provider]  hydrALAZINE (APRESOLINE) 100 MG tablet Take 100 mg by mouth 2 (two) times daily. 07/09/18   [provider]  HYDROcodone-acetaminophen (NORCO) 5-325 MG per tablet Take 1 tablet by mouth every 6 (six) hours as needed for pain. 11/26/12   Daryll Brod, MD  insulin NPH-insulin regular (NOVOLIN 70/30) (70-30) 100 UNIT/ML injection Inject 30 Units into the skin 2 (two) times daily with a meal.     [provider]  levothyroxine (SYNTHROID, LEVOTHROID) 25 MCG tablet Take 25 mcg by mouth daily.      [provider]  Multiple Vitamin (MULTIVITAMIN) tablet Take 1 tablet by mouth daily.      [provider]  mycophenolate (CELLCEPT) 500 MG tablet Take 1,500 mg by mouth 2 (two) times daily.     [provider]  pantoprazole (PROTONIX) 40 MG tablet Take 40 mg by mouth daily.    [provider]  pravastatin (PRAVACHOL) 20 MG tablet Take 20 mg by mouth daily.      [provider]  predniSONE (DELTASONE) 10 MG tablet Take 5 mg by mouth daily.     [provider]  tacrolimus (PROGRAF) 1 MG capsule Take 5 mg by mouth See admin instructions. Taking 2 capsules (2mg  dose) in the Am and 3 capsules (3mg  dose) at bedtime. Total 5mg  daily    [provider]  torsemide (DEMADEX) 20 MG tablet Take 20 mg by mouth daily.  11/03/12   Bensimhon, Shaune Pascal, MD    Family History Family History  Problem Relation Age of Onset   Diabetes Mother    Diabetes Father    Heart disease Father    Anesthesia problems Neg Hx     Social History Social History   Tobacco Use   Smoking status: Former    Packs/day: 0.10    Years: 30.00    Pack years: 3.00    Types: Cigarettes    Quit date: 08/24/2009    Years since quitting: 12.0   Smokeless tobacco: Never  Vaping Use   Vaping  Use: Never used  Substance Use Topics   Alcohol use: No   Drug use: No     Allergies   Clindamycin hcl and Prednisone   Review of Systems Review of Systems Per HPI  Physical Exam Triage Vital Signs ED Triage Vitals  Enc Vitals Group     BP 09/15/21 0907 (!) 168/92     Pulse Rate 09/15/21 0907 (!) 106     Resp 09/15/21 0907 16  Temp 09/15/21 0907 98.3 F (36.8 C)     Temp Source 09/15/21 0907 Oral     SpO2 09/15/21 0907 93 %     Weight --      Height --      Head Circumference --      Peak Flow --      Pain Score 09/15/21 0910 0     Pain Loc --      Pain Edu? --      Excl. in [place]? --    No data found.  Updated Vital Signs BP (!) 168/92 (BP Location: Left Arm)   Pulse (!) 106   Temp 98.3 F (36.8 C) (Oral)   Resp 16   SpO2 93%   Visual Acuity Right Eye Distance:   Left Eye Distance:   Bilateral Distance:    Right Eye Near:   Left Eye Near:    Bilateral Near:     Physical Exam Constitutional:      General: He is not in acute distress.    Appearance: Normal appearance. He is not toxic-appearing or diaphoretic.  HENT:     Head: Normocephalic and atraumatic.  Eyes:     Extraocular Movements: Extraocular movements intact.     Conjunctiva/sclera: Conjunctivae normal.  Pulmonary:     Effort: Pulmonary effort is normal.  Skin:    General: Skin is warm and dry.     Findings: Laceration present.     Comments: Approximately 2.5 to 3.5 cm in length linear laceration present to lateral portion of right lower leg.  Laceration is scabbed over with wound edges well approximated and appears to be healing well.  No signs of infection.  No hematoma noted.  Neurovascular intact.  No erythema or purulent drainage.  Neurological:     General: No focal deficit present.     Mental Status: He is alert and oriented to person, place, and time. Mental status is at baseline.  Psychiatric:        Mood and Affect: Mood normal.        Behavior: Behavior normal.         Thought Content: Thought content normal.        Judgment: Judgment normal.     UC Treatments / Results  Labs (all labs ordered are listed, but only abnormal results are displayed) Labs Reviewed - No data to display  EKG   Radiology DG Tibia/Fibula Right  Result Date: 09/13/2021 CLINICAL DATA:  Puncture wound yesterday, bleeding EXAM: RIGHT TIBIA AND FIBULA - 2 VIEW COMPARISON:  None. FINDINGS: Frontal and lateral views of the right tibia and fibula are obtained. There are no acute displaced fractures. Alignment is anatomic. Lateral soft tissue swelling within the mid right lower leg with overlying bandaging material. No radiopaque foreign bodies. IMPRESSION: 1. Lateral soft tissue swelling. No fracture or radiopaque foreign body. Electronically Signed   By: Randa Ngo M.D.   On: 09/13/2021 22:53    Procedures Procedures (including critical care time)  Medications Ordered in UC Medications - No data to display  Initial Impression / Assessment and Plan / UC Course  I have reviewed the triage vital signs and the nursing notes.  Pertinent labs & imaging results that were available during my care of the patient were reviewed by me and considered in my medical decision making (see chart for details).     Further review of notes from the ED visit on 09/13/2021 reveal a negative lower leg x-ray.  Platelet count  on CBC is 113, although this is consistent with a platelet count approximately 3 years ago.  Suspect that patient's intermittent bleeding episodes could be related to this, although there is low suspicion.  Patient will need to follow-up with his PCP for further evaluation and management of platelet count.  No need for further evaluation and management at the hospital due to this platelet count currently.  Wound is healing well, wound edges are well approximated and closed, and there are no signs of infection on exam.  There are no concerns for bleeding as there is no current  bleeding.  No signs of hematoma.  No red flags on exam.  Patient requesting sutures or closure of the wound, but the patient was educated that there is no need for closure as wound edges are already closed.  Patient does have diabetes and there is concern for limited wound healing and risk of infection.  Will prescribe doxycycline antibiotic to prevent and/or treat infection as this is safe with end-stage renal disease.  Advised to monitor closely for infection.  Educated on dressings that can be applied.  Advised to clean wound daily and as needed.  Patient advised to go to the hospital if excessive bleeding occurs and to follow-up with primary care or urgent care if bleeding recurs.Discussed strict return precautions. Patient verbalized understanding and is agreeable with plan.  Final Clinical Impressions(s) / UC Diagnoses   Final diagnoses:  Laceration of right leg excluding thigh, initial encounter     Discharge Instructions      Your laceration to your leg appears to be healing well.  Please follow-up with primary care if bleeding problems persist.  Go to the hospital if there is excessive bleeding.  You have been prescribed doxycycline antibiotic to prevent and treat infection.  Please monitor closely for signs of infection that include redness, swelling, purulent drainage.     ED Prescriptions     Medication Sig Dispense Auth. Provider   doxycycline (VIBRAMYCIN) 100 MG capsule Take 1 capsule (100 mg total) by mouth 2 (two) times daily. 20 capsule Teodora Medici, [place]      PDMP not reviewed this encounter.   Teodora Medici,  09/15/21 [phone]

## 2021-09-15 NOTE — Discharge Instructions (Signed)
Your laceration to your leg appears to be healing well.  Please follow-up with primary care if bleeding problems persist.  Go to the hospital if there is excessive bleeding.  You have been prescribed doxycycline antibiotic to prevent and treat infection.  Please monitor closely for signs of infection that include redness, swelling, purulent drainage.

## 2021-09-15 NOTE — ED Triage Notes (Signed)
Hit right leg on door Thursday or Friday last week. Came here for stitches because it occasionally continues to re-bleed. Denies redness, swelling, purulent drainage. Wound appears to be scabbed over at this point.

## 2021-10-08 ENCOUNTER — Telehealth: Payer: Self-pay | Admitting: Pulmonary Disease

## 2021-10-08 DIAGNOSIS — G4733 Obstructive sleep apnea (adult) (pediatric): Secondary | ICD-10-CM

## 2021-10-08 NOTE — Telephone Encounter (Signed)
VS please advise on ordering a new CPAP per pts request.   thanks

## 2021-10-09 NOTE — Telephone Encounter (Signed)
Lm for patient.  

## 2021-10-09 NOTE — Telephone Encounter (Signed)
Please send order for new CPAP 15 cm H2O with heated humidity.  He needs ROV in 3 months after getting new machine.

## 2021-10-09 NOTE — Telephone Encounter (Signed)
Called and spoke with patient. He is aware that Dr. Halford Chessman is ok with the new order for the cpap machine. He has used [employer] in the past but his insurance recently changed. He is now using [employer] and it is a HMO plan.   He is aware that I have placed the order.   Nothing further needed at time of call.

## 2021-10-11 NOTE — Telephone Encounter (Signed)
I have called and LM on VM for the pt to make him aware that he will need OV

## 2021-10-14 ENCOUNTER — Ambulatory Visit (INDEPENDENT_AMBULATORY_CARE_PROVIDER_SITE_OTHER): Payer: Medicare Other | Admitting: Primary Care

## 2021-10-14 ENCOUNTER — Encounter: Payer: Self-pay | Admitting: Primary Care

## 2021-10-14 ENCOUNTER — Other Ambulatory Visit: Payer: Self-pay

## 2021-10-14 DIAGNOSIS — G4733 Obstructive sleep apnea (adult) (pediatric): Secondary | ICD-10-CM

## 2021-10-14 NOTE — Patient Instructions (Signed)
Great compliance with CPAP, we will place an order for new machine Aim to wear every night 4-6 hour or more  Do not drive if experiencing excessive daytime sleepiness   Follow-up: 1 year or sooner if needed   CPAP and BIPAP Information CPAP and BIPAP are methods that use air pressure to keep your airways open and to help you breathe well. CPAP and BIPAP use different amounts of pressure. Your health care provider will tell you whether CPAP or BIPAP would be more helpful for you. CPAP stands for "continuous positive airway pressure." With CPAP, the amount of pressure stays the same while you breathe in (inhale) and out (exhale). BIPAP stands for "bi-level positive airway pressure." With BIPAP, the amount of pressure will be higher when you inhale and lower when you exhale. This allows you to take larger breaths. CPAP or BIPAP may be used in the hospital, or your health care provider may want you to use it at home. You may need to have a sleep study before your health care provider can order a machine for you to use at home. What are the advantages? CPAP or BIPAP can be helpful if you have: Sleep apnea. Chronic obstructive pulmonary disease (COPD). Heart failure. Medical conditions that cause muscle weakness, including muscular dystrophy or amyotrophic lateral sclerosis (ALS). Other problems that cause breathing to be shallow, weak, abnormal, or difficult. CPAP and BIPAP are most commonly used for obstructive sleep apnea (OSA) to keep the airways from collapsing when the muscles relax during sleep. What are the risks? Generally, this is a safe treatment. However, problems may occur, including: Irritated skin or skin sores if the mask does not fit properly. Dry or stuffy nose or nosebleeds. Dry mouth. Feeling gassy or bloated. Sinus or lung infection if the equipment is not cleaned properly. When should CPAP or BIPAP be used? In most cases, the mask only needs to be worn during sleep.  Generally, the mask needs to be worn throughout the night and during any daytime naps. People with certain medical conditions may also need to wear the mask at other times, such as when they are awake. Follow instructions from your health care provider about when to use the machine. What happens during CPAP or BIPAP? Both CPAP and BIPAP are provided by a small machine with a flexible plastic tube that attaches to a plastic mask that you wear. Air is blown through the mask into your nose or mouth. The amount of pressure that is used to blow the air can be adjusted on the machine. Your health care provider will set the pressure setting and help you find the best mask for you. Tips for using the mask Because the mask needs to be snug, some people feel trapped or closed-in (claustrophobic) when first using the mask. If you feel this way, you may need to get used to the mask. One way to do this is to hold the mask loosely over your nose or mouth and then gradually apply the mask more snugly. You can also gradually increase the amount of time that you use the mask. Masks are available in various types and sizes. If your mask does not fit well, talk with your health care provider about getting a different one. Some common types of masks include: Full face masks, which fit over the mouth and nose. Nasal masks, which fit over the nose. Nasal pillow or prong masks, which fit into the nostrils. If you are using a mask that fits  over your nose and you tend to breathe through your mouth, a chin strap may be applied to help keep your mouth closed. Use a skin barrier to protect your skin as told by your health care provider. Some CPAP and BIPAP machines have alarms that may sound if the mask comes off or develops a leak. If you have trouble with the mask, it is very important that you talk with your health care provider about finding a way to make the mask easier to tolerate. Do not stop using the mask. There could be  a negative impact on your health if you stop using the mask. Tips for using the machine Place your CPAP or BIPAP machine on a secure table or stand near an electrical outlet. Know where the on/off switch is on the machine. Follow instructions from your health care provider about how to set the pressure on your machine and when you should use it. Do not eat or drink while the CPAP or BIPAP machine is on. Food or fluids could get pushed into your lungs by the pressure of the CPAP or BIPAP. For home use, CPAP and BIPAP machines can be rented or purchased through home health care companies. Many different brands of machines are available. Renting a machine before purchasing may help you find out which particular machine works well for you. Your health insurance company may also decide which machine you may get. Keep the CPAP or BIPAP machine and attachments clean. Ask your health care provider for specific instructions. Check the humidifier if you have a dry stuffy nose or nosebleeds. Make sure it is working correctly. Follow these instructions at home: Take over-the-counter and prescription medicines only as told by your health care provider. Ask if you can take sinus medicine if your sinuses are blocked. Do not use any products that contain nicotine or tobacco. These products include cigarettes, chewing tobacco, and vaping devices, such as e-cigarettes. If you need help quitting, ask your health care provider. Keep all follow-up visits. This is important. Contact a health care provider if: You have redness or pressure sores on your head, face, mouth, or nose from the mask or head gear. You have trouble using the CPAP or BIPAP machine. You cannot tolerate wearing the CPAP or BIPAP mask. Someone tells you that you snore even when wearing your CPAP or BIPAP. Get help right away if: You have trouble breathing. You feel confused. Summary CPAP and BIPAP are methods that use air pressure to keep your  airways open and to help you breathe well. If you have trouble with the mask, it is very important that you talk with your health care provider about finding a way to make the mask easier to tolerate. Do not stop using the mask. There could be a negative impact to your health if you stop using the mask. Follow instructions from your health care provider about when to use the machine. This information is not intended to replace advice given to you by your health care provider. Make sure you discuss any questions you have with your health care provider. Document Revised: 06/19/2021 Document Reviewed: 10/19/2020 Elsevier Patient Education  2022 [employer].

## 2021-10-14 NOTE — Assessment & Plan Note (Addendum)
-   Patient is 100% compliant with CPAP and reports benefit in sleep from use. Average usage 9 hours 34 mins. Pressure 15cm h20; Residual AHI 3.9. No changes today. We have already placed an order for patient to be provided with new CPAP machine. Encourage weight loss efforts and advised against driving if he is experiencing excessive daytime sleepiness. FU in 1 year or sooner if needed.

## 2021-10-14 NOTE — Progress Notes (Addendum)
@Patient  ID: David Gregory, male    DOB: 03-17-1966, 55 y.o.   MRN: [id number]  No chief complaint on file.   Referring provider: Vicenta Aly, FNP  HPI: 55 year old male, former smoker. PMH significant for OSA, PAH, systolic heart failure, CKD, tobacco use. Patient of Dr. Halford Chessman, last seen by pulmonary NP on 12/07/20.   10/14/2021 - Interim hx  Patient presents today for 1 year follow-up. David Gregory is doing well today without acute complaints. David Gregory is 100% compliant with CPAP use >4 hours. No issues with mask fit or pressure setting. David Gregory is due for new CPAP machine. We have already placed an order for this. David Gregory reports benefit from wearing CPAP, David Gregory is unable to get a good night sleep without it.   Airview download 09/14/21-10/13/21 Usage 30/30 days used; 100% > 4 hours Average usage 9 hours 34 mins Pressure 15 cm h20 AHI 3.9   Allergies  Allergen Reactions   Clindamycin Hcl Shortness Of Breath, Itching and Rash    Wheezing and erythema/itching at IV site 01/27/12   Prednisone Rash    Immunization History  Administered Date(s) Administered   Hepatitis A, Adult 02/01/2020   Hepatitis B, adult 11/03/2019, 12/01/2019, 12/31/2019, 08/22/2020   Hepatitis B, ped/adol 05/31/2020   Hepb-cpg 05/31/2020   Influenza Split 08/28/2013, 09/14/2014, 10/03/2015   Influenza Whole 11/10/2012   Influenza, Seasonal, Injecte, Preservative Fre 09/11/2011, 11/11/2012, 08/24/2013, 08/24/2014, 09/14/2014, 09/27/2015, 08/14/2016, 09/23/2017   Influenza,inj,Quad PF,6+ Mos 09/27/2015, 08/14/2016, 09/23/2017, 09/14/2018, 08/04/2019, 08/22/2020   Influenza-Unspecified 09/11/2011, 11/11/2012, 08/24/2013, 08/24/2014, 09/23/2017   PFIZER(Purple Top)SARS-COV-2 Vaccination 02/04/2020, 02/28/2020   Pneumococcal Conjugate-13 02/26/2017   Pneumococcal Polysaccharide-23 10/24/2012, 10/27/2019   Tdap 02/26/2017   Zoster Recombinat (Shingrix) 12/21/2019, 03/14/2020    Past Medical History:  Diagnosis Date    Arthritis    "Every where"   Blood transfusion    with heart transplant   Carpal tunnel syndrome    Right   CHEST PAIN UNSPECIFIED    CONGESTIVE HEART FAILURE UNSPECIFIED    Diabetes mellitus    Diarrhea    Edema    ERECTILE DYSFUNCTION, ORGANIC    Gout, unspecified    Hypertension    Hypothyroidism    Renal insufficiency    pt reports levels have been at 4.8   SYSTOLIC HEART FAILURE, ACUTE ON CHRONIC    SYSTOLIC HEART FAILURE, CHRONIC    VENTRICULAR TACHYCARDIA     Tobacco History: Social History   Tobacco Use  Smoking Status Former   Packs/day: 0.10   Years: 30.00   Pack years: 3.00   Types: Cigarettes   Quit date: 08/24/2009   Years since quitting: 12.1  Smokeless Tobacco Never   Counseling given: Not Answered   Outpatient Medications Prior to Visit  Medication Sig Dispense Refill   aspirin EC 81 MG tablet Take 81 mg by mouth daily.     colchicine 0.6 MG tablet Take 0.6 mg by mouth daily as needed. For gout     febuxostat (ULORIC) 40 MG tablet Take 80 mg by mouth daily.     hydrALAZINE (APRESOLINE) 100 MG tablet Take 100 mg by mouth 2 (two) times daily.  11   HYDROcodone-acetaminophen (NORCO) 5-325 MG per tablet Take 1 tablet by mouth every 6 (six) hours as needed for pain. 30 tablet 0   insulin NPH-insulin regular (NOVOLIN 70/30) (70-30) 100 UNIT/ML injection Inject 30 Units into the skin 2 (two) times daily with a meal.      levothyroxine (SYNTHROID, LEVOTHROID)  25 MCG tablet Take 25 mcg by mouth daily.       pantoprazole (PROTONIX) 40 MG tablet Take 40 mg by mouth daily.     pravastatin (PRAVACHOL) 20 MG tablet Take 20 mg by mouth daily.       predniSONE (DELTASONE) 10 MG tablet Take 5 mg by mouth daily.      amoxicillin (AMOXIL) 500 MG capsule Take 500 mg by mouth daily. (Patient not taking: Reported on 10/14/2021)     carvedilol (COREG) 25 MG tablet Take 25 mg by mouth 2 (two) times daily with a meal.  (Patient not taking: Reported on 10/14/2021)      cloNIDine (CATAPRES) 0.3 MG tablet Take 1 tablet by mouth 2 (two) times daily. (Patient not taking: Reported on 10/14/2021)     doxycycline (VIBRAMYCIN) 100 MG capsule Take 1 capsule (100 mg total) by mouth 2 (two) times daily. (Patient not taking: Reported on 10/14/2021) 20 capsule 0   Multiple Vitamin (MULTIVITAMIN) tablet Take 1 tablet by mouth daily.   (Patient not taking: Reported on 10/14/2021)     mycophenolate (CELLCEPT) 500 MG tablet Take 1,500 mg by mouth 2 (two) times daily.  (Patient not taking: Reported on 10/14/2021)     tacrolimus (PROGRAF) 1 MG capsule Take 5 mg by mouth See admin instructions. Taking 2 capsules (2mg  dose) in the Am and 3 capsules (3mg  dose) at bedtime. Total 5mg  daily (Patient not taking: Reported on 10/14/2021)     torsemide (DEMADEX) 20 MG tablet Take 20 mg by mouth daily.  (Patient not taking: Reported on 10/14/2021)     No facility-administered medications prior to visit.   Review of Systems  Review of Systems  Constitutional: Negative.   HENT: Negative.    Respiratory: Negative.    Psychiatric/Behavioral:  Negative for sleep disturbance.     Physical Exam  BP 136/82 (BP Location: Right Arm, Patient Position: Sitting, Cuff Size: Large)   Pulse 100   Temp 98.6 F (37 C) (Oral)   Ht 5\' 4"  (1.626 m)   Wt 208 lb 12.8 oz (94.7 kg)   SpO2 93%   BMI 35.84 kg/m  Physical Exam Constitutional:      General: David Gregory is not in acute distress.    Appearance: Normal appearance. David Gregory is obese. David Gregory is not ill-appearing.  HENT:     Head: Normocephalic and atraumatic.     Mouth/Throat:     Mouth: Mucous membranes are moist.     Pharynx: Oropharynx is clear.     Comments: Mallampati class II Cardiovascular:     Rate and Rhythm: Normal rate and regular rhythm.  Pulmonary:     Effort: Pulmonary effort is normal.     Breath sounds: No wheezing, rhonchi or rales.  Musculoskeletal:        General: Normal range of motion.  Skin:    General: Skin is warm and dry.   Neurological:     Mental Status: David Gregory is alert.  Psychiatric:        Mood and Affect: Mood normal.        Behavior: Behavior normal.        Thought Content: Thought content normal.        Judgment: Judgment normal.     Lab Results:  CBC    Component Value Date/Time   WBC 5.9 09/13/2021 2252   RBC 3.49 (L) 09/13/2021 2252   HGB 11.2 (L) 09/13/2021 2252   HCT 34.2 (L) 09/13/2021 2252   PLT 113 (L)  09/13/2021 2252   MCV 98.0 09/13/2021 2252   MCH 32.1 09/13/2021 2252   MCHC 32.7 09/13/2021 2252   RDW 14.1 09/13/2021 2252   LYMPHSABS 0.3 (L) 07/29/2011 2050   MONOABS 0.4 07/29/2011 2050   EOSABS 0.1 07/29/2011 2050   BASOSABS 0.0 07/29/2011 2050    BMET    Component Value Date/Time   NA 138 09/13/2021 2252   K 4.9 09/13/2021 2252   CL 99 09/13/2021 2252   CO2 25 09/13/2021 2252   GLUCOSE 159 (H) 09/13/2021 2252   BUN 56 (H) 09/13/2021 2252   CREATININE 12.57 (H) 09/13/2021 2252   CREATININE 2.86 (H) 10/17/2011 1556   CALCIUM 9.1 09/13/2021 2252   GFRNONAA 4 (L) 09/13/2021 2252   GFRAA 18 (L) 08/09/2018 0935    BNP No results found for: BNP  ProBNP    Component Value Date/Time   PROBNP 1316.0 (H) 07/29/2011 2053    Imaging: No results found.   Assessment & Plan:   OSA (obstructive sleep apnea) - Patient is 100% compliant with CPAP and reports benefit in sleep from use. Average usage 9 hours 34 mins. Pressure 15cm h20; Residual AHI 3.9. No changes today. We have already placed an order for patient to be provided with new CPAP machine. Encourage weight loss efforts and advised against driving if David Gregory is experiencing excessive daytime sleepiness. FU in 1 year or sooner if needed.   Martyn Ehrich, NP 10/14/2021

## 2021-10-14 NOTE — Telephone Encounter (Signed)
We will fax office note to [employer]

## 2021-10-15 NOTE — Progress Notes (Signed)
Reviewed and agree with assessment/plan.   Chesley Mires, MD [facility] Pulmonary/Critical Care 10/15/2021, 6:53 AM Pager:  [phone]

## 2022-01-24 ENCOUNTER — Telehealth: Payer: Self-pay | Admitting: Primary Care

## 2022-01-27 NOTE — Telephone Encounter (Signed)
Lm x1 for patient.  

## 2022-01-29 NOTE — Telephone Encounter (Signed)
ATC David Gregory, LMTCB letting her know that we have not received fax and provided fax number ?David Gregory- [facility] [phone]  ? ?

## 2022-04-07 ENCOUNTER — Encounter: Payer: Self-pay | Admitting: Podiatrist

## 2022-04-07 ENCOUNTER — Ambulatory Visit: Payer: Medicare Other | Admitting: Podiatrist

## 2022-04-07 DIAGNOSIS — G629 Polyneuropathy, unspecified: Secondary | ICD-10-CM

## 2022-04-07 DIAGNOSIS — E119 Type 2 diabetes mellitus without complications: Secondary | ICD-10-CM | POA: Diagnosis not present

## 2022-04-07 NOTE — Progress Notes (Signed)
Subjective: ?David Gregory is a 56 y.o. male patient with history of diabetes who presents to office today for diabetic foot examination.  He relates he does have neuropathy but it is not painful for him.  He currently gets his nails done at a salon where he sustained the same person for several appointments. ? ? ?Vicenta Aly, FNP  is his primary care provider. ? ?Patient Active Problem List  ? Diagnosis Date Noted  ? Healthcare maintenance 12/07/2020  ? S/P orthotopic heart transplant ([place]) 11/30/2015  ? CKD (chronic kidney disease) 11/30/2015  ? Morbid obesity ([place]) 06/16/2014  ? OSA (obstructive sleep apnea) 03/29/2012  ? Mass of finger of left hand 01/27/2012  ? PAH (pulmonary artery hypertension) ([place]) 08/06/2011  ? DIARRHEA 12/27/2010  ? CARPAL TUNNEL SYNDROME 06/26/2010  ? VENTRICULAR TACHYCARDIA 03/18/2010  ? GOUT WITH OTHER SPECIFIED MANIFESTATIONS 09/24/2009  ? GOUT, UNSPECIFIED 09/24/2009  ? TOBACCO ABUSE 09/24/2009  ? SYSTOLIC HEART FAILURE, CHRONIC 09/24/2009  ? ERECTILE DYSFUNCTION, ORGANIC 09/24/2009  ? EDEMA 09/24/2009  ? ?Current Outpatient Medications on File Prior to Visit  ?Medication Sig Dispense Refill  ? amoxicillin (AMOXIL) 500 MG capsule Take 500 mg by mouth daily. (Patient not taking: Reported on 10/14/2021)    ? aspirin EC 81 MG tablet Take 81 mg by mouth daily.    ? carvedilol (COREG) 25 MG tablet Take 25 mg by mouth 2 (two) times daily with a meal.  (Patient not taking: Reported on 10/14/2021)    ? cloNIDine (CATAPRES) 0.3 MG tablet Take 1 tablet by mouth 2 (two) times daily. (Patient not taking: Reported on 10/14/2021)    ? colchicine 0.6 MG tablet Take 0.6 mg by mouth daily as needed. For gout    ? doxycycline (VIBRAMYCIN) 100 MG capsule Take 1 capsule (100 mg total) by mouth 2 (two) times daily. (Patient not taking: Reported on 10/14/2021) 20 capsule 0  ? febuxostat (ULORIC) 40 MG tablet Take 80 mg by mouth daily.    ? hydrALAZINE (APRESOLINE) 100 MG tablet Take 100 mg by mouth  2 (two) times daily.  11  ? HYDROcodone-acetaminophen (NORCO) 5-325 MG per tablet Take 1 tablet by mouth every 6 (six) hours as needed for pain. 30 tablet 0  ? insulin NPH-insulin regular (NOVOLIN 70/30) (70-30) 100 UNIT/ML injection Inject 30 Units into the skin 2 (two) times daily with a meal.     ? levothyroxine (SYNTHROID, LEVOTHROID) 25 MCG tablet Take 25 mcg by mouth daily.      ? Multiple Vitamin (MULTIVITAMIN) tablet Take 1 tablet by mouth daily.   (Patient not taking: Reported on 10/14/2021)    ? mycophenolate (CELLCEPT) 500 MG tablet Take 1,500 mg by mouth 2 (two) times daily.  (Patient not taking: Reported on 10/14/2021)    ? pantoprazole (PROTONIX) 40 MG tablet Take 40 mg by mouth daily.    ? pravastatin (PRAVACHOL) 20 MG tablet Take 20 mg by mouth daily.      ? predniSONE (DELTASONE) 10 MG tablet Take 5 mg by mouth daily.     ? tacrolimus (PROGRAF) 1 MG capsule Take 5 mg by mouth See admin instructions. Taking 2 capsules ('2mg'$  dose) in the Am and 3 capsules ('3mg'$  dose) at bedtime. Total '5mg'$  daily (Patient not taking: Reported on 10/14/2021)    ? torsemide (DEMADEX) 20 MG tablet Take 20 mg by mouth daily.  (Patient not taking: Reported on 10/14/2021)    ? ?No current facility-administered medications on file prior to visit.  ? ?  Allergies  ?Allergen Reactions  ? Clindamycin Hcl Shortness Of Breath, Itching and Rash  ?  Wheezing and erythema/itching at IV site 01/27/12  ? Prednisone Rash  ? ? ? ? ?Objective: ?General: Patient is awake, alert, and oriented x 3 and in no acute distress. ? ?Integument: Skin is warm, dry and supple bilateral. Nails slightly elongated.  No thickness, no discoloration, no sign of fungal infection noted 1-5 bilateral. No signs of infection. No open lesions or preulcerative lesions present bilateral. Remaining integument unremarkable. ? ?Vasculature:  Dorsalis Pedis pulse 2/4 bilateral. Posterior Tibial pulse  1/4 bilateral.  ?Capillary fill time <3 sec 1-5 bilateral. ?Temperature  gradient within normal limits. No varicosities present bilateral. No edema present bilateral.  ? ?Neurology: The patient has decreased sensation measured with a 5.07/10g Semmes Weinstein Monofilament at  3/5 pedal sites bilateral . Vibratory sensation diminished bilateral with tuning fork. No Babinski sign present bilateral.  ? ?Musculoskeletal: No symptomatic pedal deformities noted bilateral.  Very mild bunion is present left foot.  Muscular strength 5/5 in all lower extremity muscular groups bilateral without pain on range of motion . No tenderness with calf compression bilateral. ? ?Assessment and Plan: ?  ICD-10-CM   ?1. Encounter for diabetic foot exam ([place])  E11.9   ?  ?2. Neuropathy  G62.9   ?  ? ? ? ?-Examined patient. ?-Discussed and educated patient on diabetic foot care, especially with  ?regards to the vascular, neurological and musculoskeletal systems.  ?-Stressed the importance of good glycemic control and the detriment of not  ?controlling glucose levels in relation to the foot. ?-Answered all patient questions ?-dispensed information for diabetic foot health.  Discussed if he chooses to see a pedicurist for his nails he should inform him/ her he has diabetes.  At this time, the person he sees does a good job with his feet and I recommended he only see this provider for his pedicure services.  He will watch the nails and skin closely and will call if he would like his nails trimmed at our office.  ?-Patient advised to call the office if any problems or questions arise in the future ? ? ?

## 2022-04-07 NOTE — Patient Instructions (Signed)
Diabetes Mellitus and Foot Care Foot care is an important part of your health, especially when you have diabetes. Diabetes may cause you to have problems because of poor blood flow (circulation) to your feet and legs, which can cause your skin to: Become thinner and drier. Break more easily. Heal more slowly. Peel and crack. You may also have nerve damage (neuropathy) in your legs and feet, causing decreased feeling in them. This means that you may not notice minor injuries to your feet that could lead to more serious problems. Noticing and addressing any potential problems early is the best way to prevent future foot problems. How to care for your feet Foot hygiene  Wash your feet daily with warm water and mild soap. Do not use hot water. Then, pat your feet and the areas between your toes until they are completely dry. Do not soak your feet as this can dry your skin. Trim your toenails straight across. Do not dig under them or around the cuticle. File the edges of your nails with an emery board or nail file. Apply a moisturizing lotion or petroleum jelly to the skin on your feet and to dry, brittle toenails. Use lotion that does not contain alcohol and is unscented. Do not apply lotion between your toes. Shoes and socks Wear clean socks or stockings every day. Make sure they are not too tight. Do not wear knee-high stockings since they may decrease blood flow to your legs. Wear shoes that fit properly and have enough cushioning. Always look in your shoes before you put them on to be sure there are no objects inside. To break in new shoes, wear them for just a few hours a day. This prevents injuries on your feet. Wounds, scrapes, corns, and calluses  Check your feet daily for blisters, cuts, bruises, sores, and redness. If you cannot see the bottom of your feet, use a mirror or ask someone for help. Do not cut corns or calluses or try to remove them with medicine. If you find a minor scrape,  cut, or break in the skin on your feet, keep it and the skin around it clean and dry. You may clean these areas with mild soap and water. Do not clean the area with peroxide, alcohol, or iodine. If you have a wound, scrape, corn, or callus on your foot, look at it several times a day to make sure it is healing and not infected. Check for: Redness, swelling, or pain. Fluid or blood. Warmth. Pus or a bad smell. General tips Do not cross your legs. This may decrease blood flow to your feet. Do not use heating pads or hot water bottles on your feet. They may burn your skin. If you have lost feeling in your feet or legs, you may not know this is happening until it is too late. Protect your feet from hot and cold by wearing shoes, such as at the beach or on hot pavement. Schedule a complete foot exam at least once a year (annually) or more often if you have foot problems. Report any cuts, sores, or bruises to your health care provider immediately. Where to find more information American Diabetes Association: www.diabetes.org Association of Diabetes Care & Education Specialists: www.diabeteseducator.org Contact a health care provider if: You have a medical condition that increases your risk of infection and you have any cuts, sores, or bruises on your feet. You have an injury that is not healing. You have redness on your legs or feet. You   feel burning or tingling in your legs or feet. You have pain or cramps in your legs and feet. Your legs or feet are numb. Your feet always feel cold. You have pain around any toenails. Get help right away if: You have a wound, scrape, corn, or callus on your foot and: You have pain, swelling, or redness that gets worse. You have fluid or blood coming from the wound, scrape, corn, or callus. Your wound, scrape, corn, or callus feels warm to the touch. You have pus or a bad smell coming from the wound, scrape, corn, or callus. You have a fever. You have a red  line going up your leg. Summary Check your feet every day for blisters, cuts, bruises, sores, and redness. Apply a moisturizing lotion or petroleum jelly to the skin on your feet and to dry, brittle toenails. Wear shoes that fit properly and have enough cushioning. If you have foot problems, report any cuts, sores, or bruises to your health care provider immediately. Schedule a complete foot exam at least once a year (annually) or more often if you have foot problems. This information is not intended to replace advice given to you by your health care provider. Make sure you discuss any questions you have with your health care provider. Document Revised: 05/31/2020 Document Reviewed: 05/31/2020 Elsevier Patient Education  2023 [employer].  

## 2022-08-18 IMAGING — DX DG TIBIA/FIBULA 2V*R*
1 series · 2 of 2 positions shown · non-contrast
Comparison: None.

CLINICAL DATA: Puncture wound yesterday, bleeding

EXAM:
RIGHT TIBIA AND FIBULA - 2 VIEW

[Series 1: leg · 0.14mm/px · 2 of 2 slices shown]
[im 1/2]
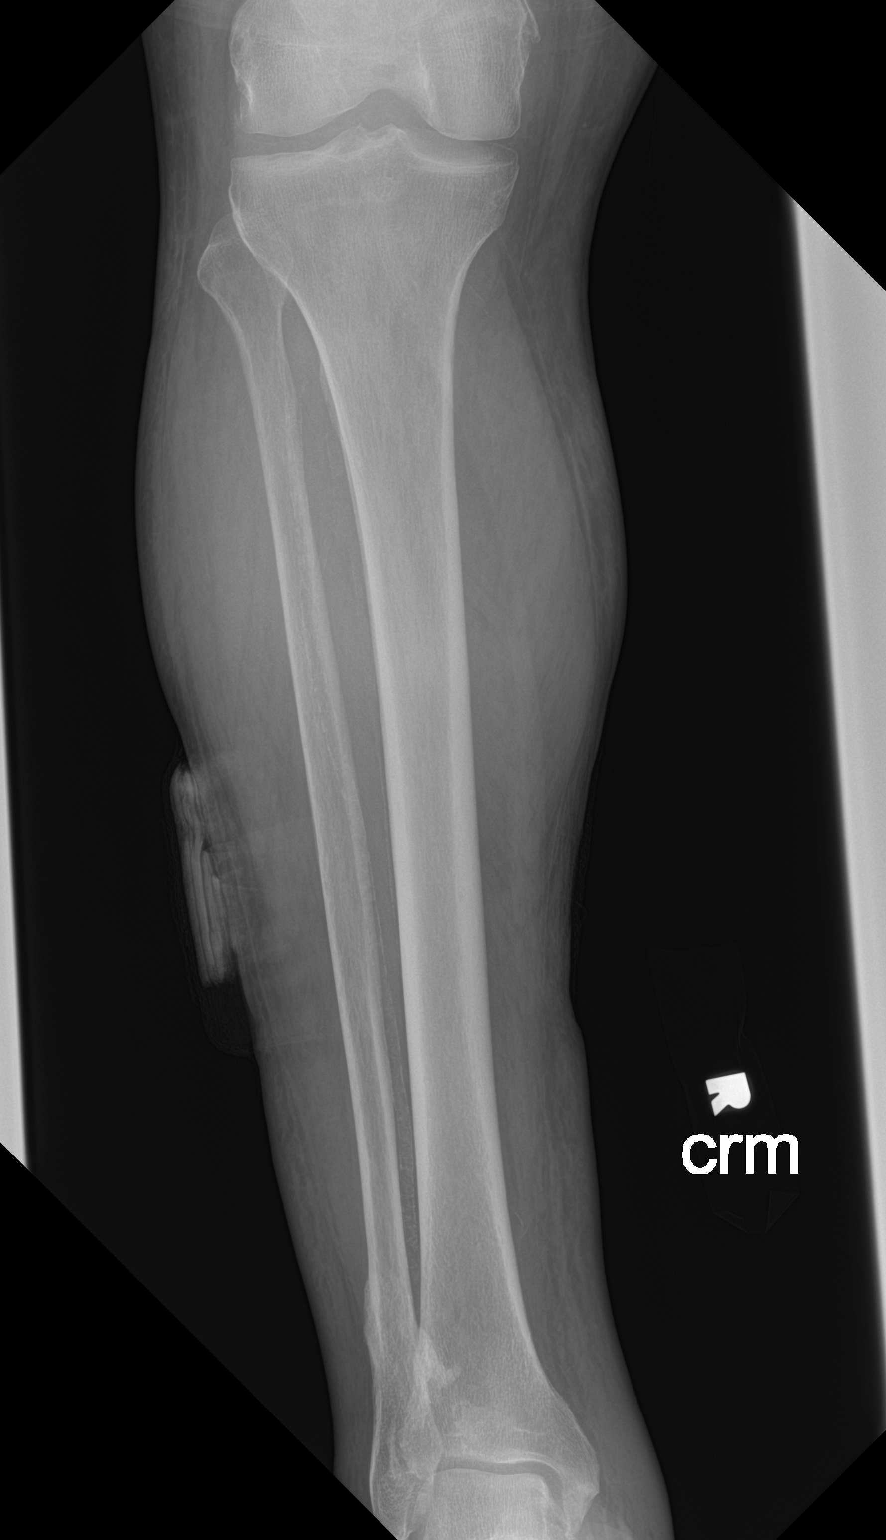
[im 2/2]
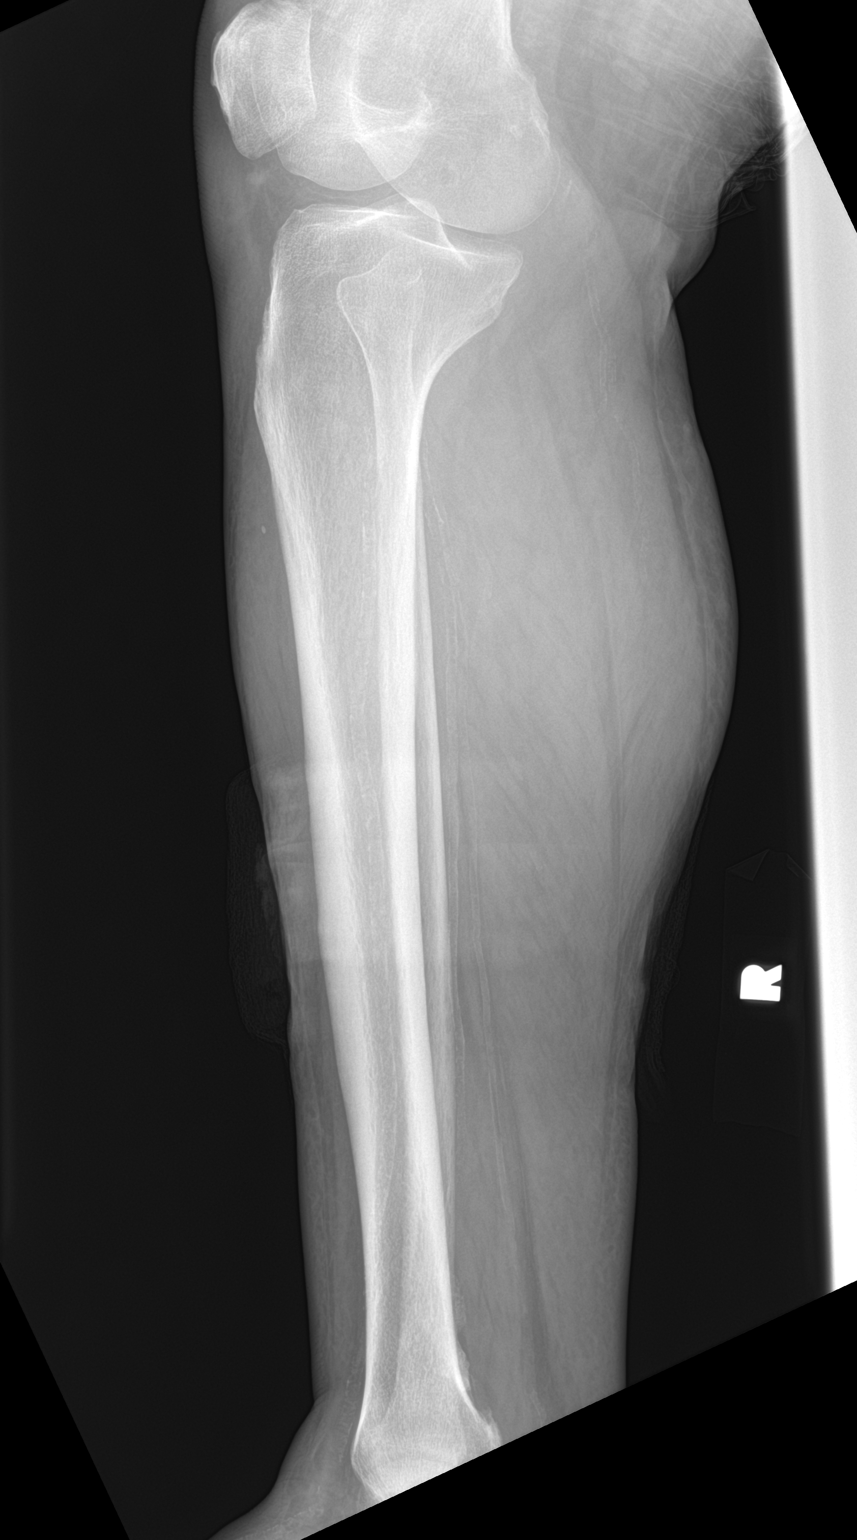

[2 of 2 positions shown; findings below may reference images not displayed]

FINDINGS: Frontal and lateral views of the right tibia and fibula are
obtained. There are no acute displaced fractures. Alignment is
anatomic. Lateral soft tissue swelling within the mid right lower
leg with overlying bandaging material. No radiopaque foreign bodies.
IMPRESSION: 1. Lateral soft tissue swelling. No fracture or radiopaque foreign
body.

## 2022-11-25 DIAGNOSIS — N186 End stage renal disease: Secondary | ICD-10-CM | POA: Diagnosis not present

## 2022-11-25 DIAGNOSIS — Z992 Dependence on renal dialysis: Secondary | ICD-10-CM | POA: Diagnosis not present

## 2022-11-25 DIAGNOSIS — N2581 Secondary hyperparathyroidism of renal origin: Secondary | ICD-10-CM | POA: Diagnosis not present

## 2022-11-27 DIAGNOSIS — N186 End stage renal disease: Secondary | ICD-10-CM | POA: Diagnosis not present

## 2022-11-27 DIAGNOSIS — Z992 Dependence on renal dialysis: Secondary | ICD-10-CM | POA: Diagnosis not present

## 2022-11-27 DIAGNOSIS — N2581 Secondary hyperparathyroidism of renal origin: Secondary | ICD-10-CM | POA: Diagnosis not present

## 2022-11-29 DIAGNOSIS — N2581 Secondary hyperparathyroidism of renal origin: Secondary | ICD-10-CM | POA: Diagnosis not present

## 2022-11-29 DIAGNOSIS — Z992 Dependence on renal dialysis: Secondary | ICD-10-CM | POA: Diagnosis not present

## 2022-11-29 DIAGNOSIS — N186 End stage renal disease: Secondary | ICD-10-CM | POA: Diagnosis not present

## 2022-12-02 DIAGNOSIS — Z992 Dependence on renal dialysis: Secondary | ICD-10-CM | POA: Diagnosis not present

## 2022-12-02 DIAGNOSIS — N186 End stage renal disease: Secondary | ICD-10-CM | POA: Diagnosis not present

## 2022-12-02 DIAGNOSIS — N2581 Secondary hyperparathyroidism of renal origin: Secondary | ICD-10-CM | POA: Diagnosis not present

## 2022-12-04 DIAGNOSIS — N2581 Secondary hyperparathyroidism of renal origin: Secondary | ICD-10-CM | POA: Diagnosis not present

## 2022-12-04 DIAGNOSIS — N186 End stage renal disease: Secondary | ICD-10-CM | POA: Diagnosis not present

## 2022-12-04 DIAGNOSIS — Z992 Dependence on renal dialysis: Secondary | ICD-10-CM | POA: Diagnosis not present

## 2022-12-06 DIAGNOSIS — N2581 Secondary hyperparathyroidism of renal origin: Secondary | ICD-10-CM | POA: Diagnosis not present

## 2022-12-06 DIAGNOSIS — N186 End stage renal disease: Secondary | ICD-10-CM | POA: Diagnosis not present

## 2022-12-06 DIAGNOSIS — Z992 Dependence on renal dialysis: Secondary | ICD-10-CM | POA: Diagnosis not present

## 2022-12-09 DIAGNOSIS — N186 End stage renal disease: Secondary | ICD-10-CM | POA: Diagnosis not present

## 2022-12-09 DIAGNOSIS — N2581 Secondary hyperparathyroidism of renal origin: Secondary | ICD-10-CM | POA: Diagnosis not present

## 2022-12-09 DIAGNOSIS — Z992 Dependence on renal dialysis: Secondary | ICD-10-CM | POA: Diagnosis not present

## 2022-12-11 DIAGNOSIS — N2581 Secondary hyperparathyroidism of renal origin: Secondary | ICD-10-CM | POA: Diagnosis not present

## 2022-12-11 DIAGNOSIS — Z992 Dependence on renal dialysis: Secondary | ICD-10-CM | POA: Diagnosis not present

## 2022-12-11 DIAGNOSIS — N186 End stage renal disease: Secondary | ICD-10-CM | POA: Diagnosis not present

## 2022-12-13 DIAGNOSIS — N2581 Secondary hyperparathyroidism of renal origin: Secondary | ICD-10-CM | POA: Diagnosis not present

## 2022-12-13 DIAGNOSIS — Z992 Dependence on renal dialysis: Secondary | ICD-10-CM | POA: Diagnosis not present

## 2022-12-13 DIAGNOSIS — N186 End stage renal disease: Secondary | ICD-10-CM | POA: Diagnosis not present

## 2022-12-16 DIAGNOSIS — N2581 Secondary hyperparathyroidism of renal origin: Secondary | ICD-10-CM | POA: Diagnosis not present

## 2022-12-16 DIAGNOSIS — N186 End stage renal disease: Secondary | ICD-10-CM | POA: Diagnosis not present

## 2022-12-16 DIAGNOSIS — Z992 Dependence on renal dialysis: Secondary | ICD-10-CM | POA: Diagnosis not present

## 2022-12-18 DIAGNOSIS — N2581 Secondary hyperparathyroidism of renal origin: Secondary | ICD-10-CM | POA: Diagnosis not present

## 2022-12-18 DIAGNOSIS — Z992 Dependence on renal dialysis: Secondary | ICD-10-CM | POA: Diagnosis not present

## 2022-12-18 DIAGNOSIS — N186 End stage renal disease: Secondary | ICD-10-CM | POA: Diagnosis not present

## 2022-12-20 DIAGNOSIS — N2581 Secondary hyperparathyroidism of renal origin: Secondary | ICD-10-CM | POA: Diagnosis not present

## 2022-12-20 DIAGNOSIS — N186 End stage renal disease: Secondary | ICD-10-CM | POA: Diagnosis not present

## 2022-12-20 DIAGNOSIS — Z992 Dependence on renal dialysis: Secondary | ICD-10-CM | POA: Diagnosis not present

## 2022-12-23 DIAGNOSIS — N2581 Secondary hyperparathyroidism of renal origin: Secondary | ICD-10-CM | POA: Diagnosis not present

## 2022-12-23 DIAGNOSIS — Z992 Dependence on renal dialysis: Secondary | ICD-10-CM | POA: Diagnosis not present

## 2022-12-23 DIAGNOSIS — N186 End stage renal disease: Secondary | ICD-10-CM | POA: Diagnosis not present

## 2022-12-24 DIAGNOSIS — N186 End stage renal disease: Secondary | ICD-10-CM | POA: Diagnosis not present

## 2022-12-24 DIAGNOSIS — Z992 Dependence on renal dialysis: Secondary | ICD-10-CM | POA: Diagnosis not present

## 2022-12-24 DIAGNOSIS — T8620 Unspecified complication of heart transplant: Secondary | ICD-10-CM | POA: Diagnosis not present

## 2023-01-26 ENCOUNTER — Encounter (HOSPITAL_BASED_OUTPATIENT_CLINIC_OR_DEPARTMENT_OTHER): Payer: Self-pay | Admitting: Pulmonary Disease

## 2023-01-26 ENCOUNTER — Ambulatory Visit (HOSPITAL_BASED_OUTPATIENT_CLINIC_OR_DEPARTMENT_OTHER): Payer: Medicare HMO | Admitting: Pulmonary Disease

## 2023-01-26 VITALS — BP 110/70 | HR 105 | Ht 64.0 in | Wt 204.1 lb

## 2023-01-26 DIAGNOSIS — G4733 Obstructive sleep apnea (adult) (pediatric): Secondary | ICD-10-CM

## 2023-01-26 NOTE — Progress Notes (Signed)
[facility]  Chief Complaint  Patient presents with   Follow-up    Cpap compliance    Past Surgical History:  He  has a past surgical history that includes Heart transplant (feb/ 2012); Fracture surgery; Eye surgery; Mass excision (01/27/2012); Carpal tunnel release (09/06/2012); Finger arthodesis (11/26/2012); and Mass excision (Left, 08/13/2018).  Past Medical History:  Chronic systolic heart failure s/p cardiac transplant 2012 followed at [facility] [place], HTN, ESRD followed at [facility], DM, Hypothyroidism, Gout   Constitutional:  BP 110/70 (BP Location: Right Arm, Cuff Size: Large)   Pulse (!) 105   Ht '5\' 4"'$  (1.626 m)   Wt 204 lb 1.6 oz (92.6 kg)   SpO2 96%   BMI 35.03 kg/m   Brief Summary:  David Gregory is a 57 y.o. male with obstructive sleep apnea.      Subjective:   I last saw him in 2020.  More recently seen by Geraldo Pitter in 2022.    He was to get a new CPAP in 2022, but couldn't afford it.  He has since got new insurance.  He uses CPAP nightly.  He would like an upgrade to his CPAP mask.  Not having sinus congestion or dry mouth.  Feels rested during the day.  Physical Exam:   Appearance - well kempt   ENMT - no sinus tenderness, no oral exudate, no LAN, Mallampati 4 airway, no stridor  Respiratory - equal breath sounds bilaterally, no wheezing or rales  CV - s1s2 regular rate and rhythm, no murmurs  Ext - no clubbing, no edema  Skin - no rashes  Psych - normal mood and affect   Pulmonary testing:  Spirometry 12/22/13 >> FEV1 1.39 (51%), FEV1% 83 Spirometry 03/02/15 >> FEV1 1.67 (59%), FEV1% 83  Sleep Tests:  PSG 04/27/12 >> AHI 139.5, SpO2 low 61%. CPAP 17 cm H2O. PLMI 0   ONO with CPAP and RA 07/07/12>>Test time 4 hr 59 min. Mean SpO2 91%, low SpO2 83%. Spent 44 min with SpO2 < 89%  CPAP 10/28/22 to 01/25/23 >> used on 90 of 90 nights with average 9 hrs 44 min.  Average AHI 3.4 with CPAP 15 cm H2O  Cardiac  Tests:  Echo 07/30/22 >> normal LV function, mod LVH, mild RV dysfunction  Social History:  He  reports that he quit smoking about 13 years ago. His smoking use included cigarettes. He has a 3.00 pack-year smoking history. He has never used smokeless tobacco. He reports that he does not drink alcohol and does not use drugs.  Family History:  His family history includes Diabetes in his father and mother; Heart disease in his father.     Assessment/Plan:   Obstructive sleep apnea. - he is compliant with CPAP and reports benefit from therapy - he uses [employer] for his DME - he is still using a S9 Resmed; will place order for new CPAP to see if he can afford new device - will try to arrange for a new Resmed CPAP 15 cm H2O  History of heart transplant. - followed at [facility]  ESRD. - on HD T/Th/Sat at [facility]  Time Spent Involved in Patient Care on Day of Examination:  27 minutes  Follow up:   Patient Instructions  Will arrange for new CPAP set up  Follow up in 4 months  Medication List:   Allergies as of 01/26/2023       Reactions   Clindamycin Hcl Shortness Of  Breath, Itching, Rash   Wheezing and erythema/itching at IV site 01/27/12   Prednisone Rash        Medication List        Accurate as of January 26, 2023  8:56 AM. If you have any questions, ask your nurse or doctor.          STOP taking these medications    amoxicillin 500 MG capsule Commonly known as: AMOXIL Stopped by: Chesley Mires, MD   doxycycline 100 MG capsule Commonly known as: VIBRAMYCIN Stopped by: Chesley Mires, MD   mycophenolate 500 MG tablet Commonly known as: CELLCEPT Stopped by: Chesley Mires, MD       TAKE these medications    aspirin EC 81 MG tablet Take 81 mg by mouth daily.   carvedilol 25 MG tablet Commonly known as: COREG Take 25 mg by mouth 2 (two) times daily with a meal.   cloNIDine 0.3 MG tablet Commonly known as: CATAPRES Take 1 tablet by mouth 2 (two) times  daily.   colchicine 0.6 MG tablet Take 0.6 mg by mouth daily as needed. For gout   febuxostat 40 MG tablet Commonly known as: ULORIC Take 80 mg by mouth daily.   hydrALAZINE 100 MG tablet Commonly known as: APRESOLINE Take 100 mg by mouth 2 (two) times daily.   HYDROcodone-acetaminophen 5-325 MG tablet Commonly known as: Norco Take 1 tablet by mouth every 6 (six) hours as needed for pain.   insulin NPH-regular Human (70-30) 100 UNIT/ML injection Inject 30 Units into the skin 2 (two) times daily with a meal.   levothyroxine 25 MCG tablet Commonly known as: SYNTHROID Take 25 mcg by mouth daily.   multivitamin tablet Take 1 tablet by mouth daily.   pantoprazole 40 MG tablet Commonly known as: PROTONIX Take 40 mg by mouth daily.   pravastatin 20 MG tablet Commonly known as: PRAVACHOL Take 20 mg by mouth daily.   predniSONE 10 MG tablet Commonly known as: DELTASONE Take 5 mg by mouth daily.   tacrolimus 1 MG capsule Commonly known as: PROGRAF Take 5 mg by mouth See admin instructions. Taking 2 capsules ('2mg'$  dose) in the Am and 3 capsules ('3mg'$  dose) at bedtime. Total '5mg'$  daily   torsemide 20 MG tablet Commonly known as: DEMADEX Take 20 mg by mouth daily.        Signature:  Chesley Mires, MD [place] Pager - [phone] 01/26/2023, 8:56 AM

## 2023-01-26 NOTE — Patient Instructions (Signed)
Will arrange for new CPAP set up  Follow up in 4 months

## 2023-04-09 ENCOUNTER — Ambulatory Visit
Admission: RE | Admit: 2023-04-09 | Discharge: 2023-04-09 | Disposition: A | Discharge status: Home / Self Care | Payer: Medicare HMO | Source: Ambulatory Visit | Attending: Family Medicine | Admitting: Family Medicine

## 2023-04-09 ENCOUNTER — Other Ambulatory Visit: Payer: Self-pay | Admitting: Family Medicine

## 2023-04-09 DIAGNOSIS — M25552 Pain in left hip: Secondary | ICD-10-CM

## 2023-06-15 ENCOUNTER — Ambulatory Visit (HOSPITAL_BASED_OUTPATIENT_CLINIC_OR_DEPARTMENT_OTHER): Payer: Medicare HMO | Admitting: Pulmonary Disease

## 2024-03-14 ENCOUNTER — Encounter (HOSPITAL_COMMUNITY): Admission: RE | Payer: Self-pay | Source: Home / Self Care

## 2024-03-14 ENCOUNTER — Ambulatory Visit (HOSPITAL_COMMUNITY): Admission: RE | Admit: 2024-03-14 | Source: Home / Self Care | Admitting: Nephrology

## 2024-03-14 SURGERY — A/V SHUNT INTERVENTION
Anesthesia: LOCAL

## 2024-04-05 ENCOUNTER — Other Ambulatory Visit: Payer: Self-pay | Admitting: Nephrology

## 2024-04-05 DIAGNOSIS — N23 Unspecified renal colic: Secondary | ICD-10-CM

## 2024-04-08 ENCOUNTER — Inpatient Hospital Stay: Admission: RE | Admit: 2024-04-08 | Source: Ambulatory Visit

## 2024-04-13 ENCOUNTER — Ambulatory Visit
Admission: RE | Admit: 2024-04-13 | Discharge: 2024-04-13 | Disposition: A | Discharge status: Home / Self Care | Source: Ambulatory Visit | Attending: Nephrology | Admitting: Nephrology

## 2024-04-13 DIAGNOSIS — N23 Unspecified renal colic: Secondary | ICD-10-CM

## 2024-08-01 ENCOUNTER — Other Ambulatory Visit: Payer: Self-pay

## 2024-08-01 ENCOUNTER — Ambulatory Visit (HOSPITAL_COMMUNITY)
Admission: RE | Admit: 2024-08-01 | Discharge: 2024-08-01 | Disposition: A | Discharge status: Home / Self Care | Attending: Vascular Surgery | Admitting: Vascular Surgery

## 2024-08-01 ENCOUNTER — Encounter (HOSPITAL_COMMUNITY)
Admission: RE | Disposition: A | Discharge status: Home / Self Care | Payer: Self-pay | Source: Home / Self Care | Attending: Vascular Surgery

## 2024-08-01 DIAGNOSIS — Z5986 Financial insecurity: Secondary | ICD-10-CM | POA: Insufficient documentation

## 2024-08-01 DIAGNOSIS — N186 End stage renal disease: Secondary | ICD-10-CM | POA: Insufficient documentation

## 2024-08-01 DIAGNOSIS — T82510A Breakdown (mechanical) of surgically created arteriovenous fistula, initial encounter: Secondary | ICD-10-CM | POA: Diagnosis not present

## 2024-08-01 DIAGNOSIS — I132 Hypertensive heart and chronic kidney disease with heart failure and with stage 5 chronic kidney disease, or end stage renal disease: Secondary | ICD-10-CM | POA: Diagnosis not present

## 2024-08-01 DIAGNOSIS — Z87891 Personal history of nicotine dependence: Secondary | ICD-10-CM | POA: Diagnosis not present

## 2024-08-01 DIAGNOSIS — T82858A Stenosis of vascular prosthetic devices, implants and grafts, initial encounter: Secondary | ICD-10-CM | POA: Diagnosis present

## 2024-08-01 DIAGNOSIS — I5022 Chronic systolic (congestive) heart failure: Secondary | ICD-10-CM | POA: Insufficient documentation

## 2024-08-01 DIAGNOSIS — Y832 Surgical operation with anastomosis, bypass or graft as the cause of abnormal reaction of the patient, or of later complication, without mention of misadventure at the time of the procedure: Secondary | ICD-10-CM | POA: Insufficient documentation

## 2024-08-01 DIAGNOSIS — Z992 Dependence on renal dialysis: Secondary | ICD-10-CM | POA: Diagnosis not present

## 2024-08-01 DIAGNOSIS — E1122 Type 2 diabetes mellitus with diabetic chronic kidney disease: Secondary | ICD-10-CM | POA: Diagnosis not present

## 2024-08-01 HISTORY — PX: VENOUS ANGIOPLASTY: CATH118376

## 2024-08-01 HISTORY — PX: A/V FISTULAGRAM: CATH118298

## 2024-08-01 SURGERY — A/V FISTULAGRAM
Anesthesia: LOCAL | Site: Arm Upper | Laterality: Left

## 2024-08-01 MED ORDER — FENTANYL CITRATE (PF) 100 MCG/2ML IJ SOLN
INTRAMUSCULAR | Status: DC | PRN
  Administered 2024-08-01: 50 ug via INTRAVENOUS

## 2024-08-01 MED ORDER — FENTANYL CITRATE (PF) 100 MCG/2ML IJ SOLN
INTRAMUSCULAR | Status: AC
  Filled 2024-08-01: qty 2

## 2024-08-01 MED ORDER — LIDOCAINE HCL (PF) 1 % IJ SOLN
INTRAMUSCULAR | Status: DC | PRN
  Administered 2024-08-01: 5 mL

## 2024-08-01 MED ORDER — MIDAZOLAM HCL 2 MG/2ML IJ SOLN
INTRAMUSCULAR | Status: DC | PRN
  Administered 2024-08-01: 1 mg via INTRAVENOUS

## 2024-08-01 MED ORDER — MIDAZOLAM HCL 2 MG/2ML IJ SOLN
INTRAMUSCULAR | Status: AC
  Filled 2024-08-01: qty 2

## 2024-08-01 MED ORDER — HEPARIN (PORCINE) IN NACL 1000-0.9 UT/500ML-% IV SOLN
INTRAVENOUS | Status: DC | PRN
  Administered 2024-08-01: 500 mL

## 2024-08-01 MED ORDER — IODIXANOL 320 MG/ML IV SOLN
INTRAVENOUS | Status: DC | PRN
  Administered 2024-08-01: 30 mL via INTRAVENOUS

## 2024-08-01 MED ORDER — LIDOCAINE HCL (PF) 1 % IJ SOLN
INTRAMUSCULAR | Status: AC
  Filled 2024-08-01: qty 30

## 2024-08-01 SURGICAL SUPPLY — 11 items
BALLOON MUSTANG 10.0X40 75 (BALLOONS) IMPLANT
BALLOON MUSTANG 7.0X20 75 (BALLOONS) IMPLANT
CATH ANGIO 5F BER2 65CM (CATHETERS) IMPLANT
GUIDEWIRE ANGLED .035 180CM (WIRE) IMPLANT
KIT ENCORE 26 ADVANTAGE (KITS) IMPLANT
KIT MICROPUNCTURE NIT STIFF (SHEATH) IMPLANT
SHEATH PINNACLE R/O II 7F 4CM (SHEATH) IMPLANT
SHEATH PROBE COVER 6X72 (BAG) IMPLANT
STOPCOCK MORSE 400PSI 3WAY (MISCELLANEOUS) IMPLANT
TRAY PV CATH (CUSTOM PROCEDURE TRAY) ×2 IMPLANT
TUBING CIL FLEX 10 FLL-RA (TUBING) IMPLANT

## 2024-08-01 NOTE — H&P (Signed)
 [facility] [place]  ASSESSMENT / PLAN: 58 y.o. male with end-stage renal disease dialyzing through a left arm AV fistula.  Fistula performance has been poor at dialysis.  Plan fistulogram today to evaluate and treat.  CHIEF COMPLAINT: End-stage renal disease  HISTORY OF PRESENT ILLNESS: David Gregory is a 58 y.o. male who presents to the dialysis access center for evaluation of a poorly performing left arm AV fistula.  The patient reports the fistula needs to be cleaned out every so often.  He has no other complaints today.  Past Medical History:  Diagnosis Date   Arthritis    Every where   Blood transfusion    with heart transplant   Carpal tunnel syndrome    Right   CHEST PAIN UNSPECIFIED    CONGESTIVE HEART FAILURE UNSPECIFIED    Diabetes mellitus    Diarrhea    Edema    ERECTILE DYSFUNCTION, ORGANIC    Gout, unspecified    Hypertension    Hypothyroidism    Renal insufficiency    pt reports levels have been at 4.8   SYSTOLIC HEART FAILURE, ACUTE ON CHRONIC    SYSTOLIC HEART FAILURE, CHRONIC    VENTRICULAR TACHYCARDIA     Past Surgical History:  Procedure Laterality Date   CARPAL TUNNEL RELEASE  09/06/2012   Procedure: CARPAL TUNNEL RELEASE;  Surgeon: Arley JONELLE Curia, MD;  Location: [facility];  Service: Orthopedics;  Laterality: Right;  FOREARM BLOCK   EYE SURGERY     styl removed   FINGER ARTHRODESIS  11/26/2012   Procedure: ARTHRODESIS FINGER;  Surgeon: Arley JONELLE Curia, MD;  Location: [facility];  Service: Orthopedics;  Laterality: Right;  EXCISION TOPHUS FUSION DIP (Right Little Finger)    FRACTURE SURGERY     bilateral arms and l ankle fixed   HEART TRANSPLANT  feb/ 2012   Done at [facility] followed by Dr. Sharl   MASS EXCISION  01/27/2012   Procedure: EXCISION MASS;  Surgeon: Lonni CINDERELLA Poli, MD;  Location: [facility] OR;  Service: Orthopedics;  Laterality: Left;  Excision left middle finger mass   MASS EXCISION  Left 08/13/2018   Procedure: EXCISION OF LEFT ELBOW MASS;  Surgeon: Sissy Cough, MD;  Location: [facility] OR;  Service: Orthopedics;  Laterality: Left;    Family History  Problem Relation Age of Onset   Diabetes Mother    Diabetes Father    Heart disease Father    Anesthesia problems Neg Hx     Social History   Socioeconomic History   Marital status: Single    Spouse name: Not on file   Number of children: 2   Years of education: Not on file   Highest education level: Not on file  Occupational History   Occupation: disabled  Tobacco Use   Smoking status: Former    Current packs/day: 0.00    Average packs/day: 0.1 packs/day for 30.0 years (3.0 ttl pk-yrs)    Types: Cigarettes    Start date: 08/25/1979    Quit date: 08/24/2009    Years since quitting: 14.9   Smokeless tobacco: Never  Vaping Use   Vaping status: Never Used  Substance and Sexual Activity   Alcohol use: No   Drug use: No   Sexual activity: Not on file  Other Topics Concern   Not on file  Social History Narrative   Not on file   Social Drivers of Health   Financial Resource Strain: Medium Risk (12/27/2021)  Received from [employer]   Overall Financial Resource Strain (CARDIA)    Difficulty of Paying Living Expenses: Somewhat hard  Food Insecurity: Food Insecurity Present (12/27/2021)   Received from [facility]   Hunger Vital Sign    Within the past 12 months, you worried that your food would run out before you got the money to buy more.: Sometimes true    Within the past 12 months, the food you bought just didn't last and you didn't have money to get more.: Sometimes true  Transportation Needs: No Transportation Needs (01/17/2022)   Received from [facility] - Transportation    In the past 12 months, has lack of transportation kept you from medical appointments or from getting medications?: No    Lack of Transportation (Non-Medical): No  Physical Activity: Inactive  (12/27/2021)   Received from [facility]   Exercise Vital Sign    On average, how many days per week do you engage in moderate to strenuous exercise (like a brisk walk)?: 0 days    On average, how many minutes do you engage in exercise at this level?: 0 min  Stress: No Stress Concern Present (12/27/2021)   Received from [facility] - Occupational Stress Questionnaire    Feeling of Stress : Not at all  Social Connections: Unknown (04/06/2022)   Received from [facility]   Social Network    Social Network: Not on file  Intimate Partner Violence: Unknown (02/26/2022)   Received from [facility]   HITS    Physically Hurt: Not on file    Insult or Talk Down To: Not on file    Threaten Physical Harm: Not on file    Scream or Curse: Not on file    Allergies  Allergen Reactions   Clindamycin  Hcl Shortness Of Breath, Itching and Rash    Wheezing and erythema/itching at IV site 01/27/12   Prednisone  Rash    Current Facility-Administered Medications  Medication Dose Route Frequency Provider Last Rate Last Admin   fentaNYL  (SUBLIMAZE ) injection    PRN Magda Debby SAILOR, MD   50 mcg at 08/01/24 0738   Heparin  (Porcine) in NaCl 1000-0.9 UT/500ML-% SOLN    PRN Magda Debby SAILOR, MD   500 mL at 08/01/24 0735   lidocaine  (PF) (XYLOCAINE ) 1 % injection    PRN Magda Debby SAILOR, MD   5 mL at 08/01/24 0735   midazolam  (VERSED ) injection    PRN Magda Debby SAILOR, MD   1 mg at 08/01/24 0738    PHYSICAL EXAM Vitals:   08/01/24 0710 08/01/24 0722  BP: 123/84   Pulse: (!) 102   Resp: 14   SpO2: 94% 99%   Chronically ill male in no distress Regular rate and rhythm Unlabored breathing Left arm AV fistula with pulsatile thrill  PERTINENT LABORATORY AND RADIOLOGIC DATA  Most recent CBC    Latest Ref Rng & Units 09/13/2021   10:52 PM 08/09/2018    9:35 AM 11/26/2012   11:28 AM  CBC  WBC 4.0 - 10.5 K/uL 5.9  5.0    Hemoglobin 13.0 - 17.0 g/dL 88.7  87.9   89.2   Hematocrit 39.0 - 52.0 % 34.2  39.4    Platelets 150 - 400 K/uL 113  115       Most recent CMP    Latest Ref Rng & Units 09/13/2021   10:52 PM 08/09/2018    9:35 AM  11/22/2012    4:00 PM  CMP  Glucose 70 - 99 mg/dL 840  867  794   BUN 6 - 20 mg/dL 56  66  42   Creatinine 0.61 - 1.24 mg/dL 87.42  6.00  7.88   Sodium 135 - 145 mmol/L 138  143  140   Potassium 3.5 - 5.1 mmol/L 4.9  4.2  4.4   Chloride 98 - 111 mmol/L 99  111  103   CO2 22 - 32 mmol/L 25  18  20    Calcium 8.9 - 10.3 mg/dL 9.1  9.2  89.8     Hgb A1c MFr Bld (%)  Date Value  08/09/2018 6.6 (H)   Luna Audia N. Magda, MD FACS [facility] Phone Number: [phone] 08/01/2024 7:57 AM   Total time spent on preparing this encounter including chart review, data review, collecting history, examining the patient, and coordinating care: 30 minutes  Portions of this report may have been transcribed using voice recognition software.  Every effort has been made to ensure accuracy; however, inadvertent computerized transcription errors may still be present.

## 2024-08-01 NOTE — Op Note (Signed)
 DATE OF SERVICE: 08/01/2024  PATIENT:  David Gregory  58 y.o. male  PRE-OPERATIVE DIAGNOSIS:  end-stage renal disease; poorly performing left arm fistula  POST-OPERATIVE DIAGNOSIS:  Same  PROCEDURE:   1) Ultrasound guided left arm fistula access (CPT [phone]) 2) left upper extremity fistulagram with peripheral angioplasty (CPT [phone]) - 7 x 20 mm Mustang balloon to cephalic arch 3) left upper extremity fistulagram with central angioplasty (CPT +63092) -10 x 40 Mustang balloon to left brachiocephalic vein 4) conscious sedation (16 minutes) (CPT 99152) 5) established outpatient evaluation and management - level 3 (CPT 99213)  SURGEON:  Debby SAILOR. Magda, MD  ASSISTANT: none  ANESTHESIA:   local and IV sedation  ESTIMATED BLOOD LOSS: min  LOCAL MEDICATIONS USED:  LIDOCAINE    COUNTS: confirmed correct.  PATIENT DISPOSITION:  PACU - hemodynamically stable.   Delay start of Pharmacological VTE agent (>24hrs) due to surgical blood loss or risk of bleeding: no  INDICATION FOR PROCEDURE: David Gregory is a 58 y.o. male with end-stage renal disease, who presents to the dialysis access center for poorly performed left arm fistula.. After careful discussion of risks, benefits, and alternatives the patient was offered fistulogram. The patient understood and wished to proceed.  OPERATIVE FINDINGS:  Left upper extremity Central venous: 90% stenosis in the brachiocephalic vein just beyond the left jugular vein.  Otherwise widely patent Subclavian vein: No stenosis Cephalic arch: 95% stenosis in short segment near confluence with subclavian vein Fistula: Large aneurysmal portion likely from chronic cannulation.  Fistula beyond this is patent without stenosis Anastomosis: No stenosis  DESCRIPTION OF PROCEDURE: After identification of the patient in the pre-operative holding area, the patient was transferred to the operating room. The patient was positioned supine on the operating room table.   The left upper extremity was prepped and draped in standard fashion. A surgical pause was performed confirming correct patient, procedure, and operative location.  The left upper extremity was anesthetized with subcutaneous injection of 1% lidocaine  over the area of planned access. Using ultrasound guidance, the left upper extremity dialysis access was accessed with micropuncture technique.  Fistulogram was performed in stations with the micro sheath.  See above for details.  The decision was made to intervene.  Glidewire was navigated across the 2 lesions.  Angioplasty was performed of the cephalic arch lesion with a 7 x 20 mm Mustang balloon.  30% residual stenosis was noted after angioplasty.  Angioplasty was performed of the brachiocephalic vein lesion with a 10 x 40 mm Mustang balloon.  40% residual stenosis was noted after angioplasty.  The thrill was significantly augmented after both of these and so I elected not to stent these lesions.  All endovascular equipment was removed.  A figure-of-eight stitch was applied to the exit site with good hemostasis.  Sterile bandage was applied.  Conscious sedation was administered with the use of IV fentanyl  and midazolam  under continuous physician and nurse monitoring.  Heart rate, blood pressure, and oxygen saturation were continuously monitored.  Total sedation time was 16 minutes  Upon completion of the case instrument and sharps counts were confirmed correct. The patient was transferred to the PACU in good condition. I was present for all portions of the procedure.  PLAN: Fistula remains amenable to percutaneous intervention.  Should cephalic arch and/or brachiocephalic lesions recur in short interval, would pursue stenting.  Okay to use fistula at dialysis.  Debby SAILOR. Magda, MD [facility] Phone Number: [phone] 08/01/2024  7:59 AM

## 2024-08-02 ENCOUNTER — Encounter (HOSPITAL_COMMUNITY): Payer: Self-pay | Admitting: Vascular Surgery
